# Patient Record
Sex: Female | Born: 1935 | Race: White | Hispanic: No | Smoking: Former smoker
Health system: Southern US, Community
[De-identification: ages and names within clinical notes are randomized; demographics above are authoritative.]

## PROBLEM LIST (undated history)

## (undated) DIAGNOSIS — M810 Age-related osteoporosis without current pathological fracture: Secondary | ICD-10-CM

## (undated) DIAGNOSIS — K219 Gastro-esophageal reflux disease without esophagitis: Secondary | ICD-10-CM

## (undated) DIAGNOSIS — I2510 Atherosclerotic heart disease of native coronary artery without angina pectoris: Secondary | ICD-10-CM

## (undated) DIAGNOSIS — I639 Cerebral infarction, unspecified: Secondary | ICD-10-CM

## (undated) DIAGNOSIS — E7800 Pure hypercholesterolemia, unspecified: Secondary | ICD-10-CM

## (undated) DIAGNOSIS — M1990 Unspecified osteoarthritis, unspecified site: Secondary | ICD-10-CM

## (undated) DIAGNOSIS — I77819 Aortic ectasia, unspecified site: Secondary | ICD-10-CM

## (undated) DIAGNOSIS — I10 Essential (primary) hypertension: Secondary | ICD-10-CM

## (undated) DIAGNOSIS — I712 Thoracic aortic aneurysm, without rupture, unspecified: Secondary | ICD-10-CM

## (undated) DIAGNOSIS — K529 Noninfective gastroenteritis and colitis, unspecified: Secondary | ICD-10-CM

## (undated) HISTORY — PX: BACK SURGERY: SHX140

## (undated) HISTORY — PX: OTHER SURGICAL HISTORY: SHX169

## (undated) HISTORY — DX: Gastro-esophageal reflux disease without esophagitis: K21.9

## (undated) HISTORY — PX: SHOULDER SURGERY: SHX246

## (undated) HISTORY — DX: Unspecified osteoarthritis, unspecified site: M19.90

## (undated) HISTORY — DX: Thoracic aortic aneurysm, without rupture: I71.2

## (undated) HISTORY — PX: TEMPORAL ARTERY BIOPSY / LIGATION: SUR132

## (undated) HISTORY — PX: ABDOMINAL HYSTERECTOMY: SHX81

## (undated) HISTORY — DX: Pure hypercholesterolemia, unspecified: E78.00

## (undated) HISTORY — DX: Thoracic aortic aneurysm, without rupture, unspecified: I71.20

---

## 1998-07-26 ENCOUNTER — Other Ambulatory Visit: Admission: RE | Admit: 1998-07-26 | Discharge: 1998-07-26 | Payer: Self-pay | Admitting: *Deleted

## 1999-07-18 ENCOUNTER — Encounter: Admission: RE | Admit: 1999-07-18 | Discharge: 1999-07-31 | Payer: Self-pay | Admitting: Anesthesiology

## 1999-07-31 ENCOUNTER — Encounter: Payer: Self-pay | Admitting: Neurosurgery

## 1999-08-01 ENCOUNTER — Inpatient Hospital Stay (HOSPITAL_COMMUNITY): Admission: EM | Admit: 1999-08-01 | Discharge: 1999-08-02 | Payer: Self-pay | Admitting: Neurosurgery

## 1999-08-01 ENCOUNTER — Encounter: Payer: Self-pay | Admitting: Neurosurgery

## 1999-11-03 ENCOUNTER — Encounter: Payer: Self-pay | Admitting: Obstetrics and Gynecology

## 1999-11-03 ENCOUNTER — Encounter: Admission: RE | Admit: 1999-11-03 | Discharge: 1999-11-03 | Payer: Self-pay | Admitting: Obstetrics and Gynecology

## 2000-11-04 ENCOUNTER — Encounter: Admission: RE | Admit: 2000-11-04 | Discharge: 2000-11-04 | Payer: Self-pay | Admitting: Obstetrics and Gynecology

## 2000-11-04 ENCOUNTER — Encounter: Payer: Self-pay | Admitting: Obstetrics and Gynecology

## 2001-11-07 ENCOUNTER — Encounter: Payer: Self-pay | Admitting: Obstetrics and Gynecology

## 2001-11-07 ENCOUNTER — Encounter: Admission: RE | Admit: 2001-11-07 | Discharge: 2001-11-07 | Payer: Self-pay | Admitting: Obstetrics and Gynecology

## 2001-11-19 ENCOUNTER — Ambulatory Visit (HOSPITAL_COMMUNITY): Admission: RE | Admit: 2001-11-19 | Discharge: 2001-11-19 | Payer: Self-pay | Admitting: Cardiology

## 2001-12-11 ENCOUNTER — Encounter: Admission: RE | Admit: 2001-12-11 | Discharge: 2001-12-11 | Payer: Self-pay | Admitting: Cardiology

## 2001-12-11 ENCOUNTER — Encounter: Payer: Self-pay | Admitting: Cardiology

## 2003-08-07 ENCOUNTER — Inpatient Hospital Stay (HOSPITAL_COMMUNITY): Admission: EM | Admit: 2003-08-07 | Discharge: 2003-08-09 | Payer: Self-pay | Admitting: Emergency Medicine

## 2003-08-07 ENCOUNTER — Encounter: Payer: Self-pay | Admitting: Cardiology

## 2003-08-08 ENCOUNTER — Encounter: Payer: Self-pay | Admitting: Cardiology

## 2003-11-04 ENCOUNTER — Ambulatory Visit (HOSPITAL_COMMUNITY): Admission: RE | Admit: 2003-11-04 | Discharge: 2003-11-04 | Payer: Self-pay | Admitting: Gastroenterology

## 2004-06-23 ENCOUNTER — Ambulatory Visit (HOSPITAL_COMMUNITY): Admission: RE | Admit: 2004-06-23 | Discharge: 2004-06-23 | Payer: Self-pay | Admitting: Cardiology

## 2004-08-31 ENCOUNTER — Ambulatory Visit (HOSPITAL_BASED_OUTPATIENT_CLINIC_OR_DEPARTMENT_OTHER): Admission: RE | Admit: 2004-08-31 | Discharge: 2004-08-31 | Payer: Self-pay | Admitting: Surgery

## 2004-08-31 ENCOUNTER — Encounter (INDEPENDENT_AMBULATORY_CARE_PROVIDER_SITE_OTHER): Payer: Self-pay | Admitting: Specialist

## 2004-08-31 ENCOUNTER — Ambulatory Visit (HOSPITAL_COMMUNITY): Admission: RE | Admit: 2004-08-31 | Discharge: 2004-08-31 | Payer: Self-pay | Admitting: Surgery

## 2004-10-31 ENCOUNTER — Observation Stay (HOSPITAL_COMMUNITY): Admission: AD | Admit: 2004-10-31 | Discharge: 2004-11-01 | Payer: Self-pay | Admitting: Family Medicine

## 2004-10-31 ENCOUNTER — Ambulatory Visit: Payer: Self-pay | Admitting: Family Medicine

## 2005-01-31 ENCOUNTER — Ambulatory Visit (HOSPITAL_COMMUNITY): Admission: RE | Admit: 2005-01-31 | Discharge: 2005-01-31 | Payer: Self-pay | Admitting: Gastroenterology

## 2006-10-28 ENCOUNTER — Ambulatory Visit (HOSPITAL_COMMUNITY): Admission: RE | Admit: 2006-10-28 | Discharge: 2006-10-28 | Payer: Self-pay | Admitting: Emergency Medicine

## 2006-12-27 ENCOUNTER — Encounter: Admission: RE | Admit: 2006-12-27 | Discharge: 2006-12-27 | Payer: Self-pay | Admitting: Orthopedic Surgery

## 2007-01-06 ENCOUNTER — Ambulatory Visit (HOSPITAL_BASED_OUTPATIENT_CLINIC_OR_DEPARTMENT_OTHER): Admission: RE | Admit: 2007-01-06 | Discharge: 2007-01-06 | Payer: Self-pay | Admitting: Orthopedic Surgery

## 2007-03-19 ENCOUNTER — Encounter: Payer: Self-pay | Admitting: Cardiology

## 2007-03-26 ENCOUNTER — Encounter: Admission: RE | Admit: 2007-03-26 | Discharge: 2007-03-26 | Payer: Self-pay | Admitting: *Deleted

## 2009-01-19 ENCOUNTER — Ambulatory Visit: Payer: Self-pay | Admitting: Surgery

## 2009-04-20 ENCOUNTER — Encounter: Admission: RE | Admit: 2009-04-20 | Discharge: 2009-04-20 | Payer: Self-pay | Admitting: Emergency Medicine

## 2009-04-27 ENCOUNTER — Encounter: Admission: RE | Admit: 2009-04-27 | Discharge: 2009-04-27 | Payer: Self-pay | Admitting: Emergency Medicine

## 2009-08-11 ENCOUNTER — Encounter: Admission: RE | Admit: 2009-08-11 | Discharge: 2009-08-11 | Payer: Self-pay | Admitting: Emergency Medicine

## 2011-01-30 ENCOUNTER — Inpatient Hospital Stay (HOSPITAL_COMMUNITY)
Admission: EM | Admit: 2011-01-30 | Discharge: 2011-02-02 | DRG: 392 | Disposition: A | Discharge status: Home / Self Care | Payer: Medicare Other | Attending: Internal Medicine | Admitting: Internal Medicine

## 2011-01-30 DIAGNOSIS — R011 Cardiac murmur, unspecified: Secondary | ICD-10-CM | POA: Diagnosis present

## 2011-01-30 DIAGNOSIS — D62 Acute posthemorrhagic anemia: Secondary | ICD-10-CM | POA: Diagnosis present

## 2011-01-30 DIAGNOSIS — E876 Hypokalemia: Secondary | ICD-10-CM | POA: Diagnosis present

## 2011-01-30 DIAGNOSIS — K5289 Other specified noninfective gastroenteritis and colitis: Principal | ICD-10-CM | POA: Diagnosis present

## 2011-01-30 DIAGNOSIS — K219 Gastro-esophageal reflux disease without esophagitis: Secondary | ICD-10-CM | POA: Diagnosis present

## 2011-01-30 DIAGNOSIS — E785 Hyperlipidemia, unspecified: Secondary | ICD-10-CM | POA: Diagnosis present

## 2011-01-30 DIAGNOSIS — I2510 Atherosclerotic heart disease of native coronary artery without angina pectoris: Secondary | ICD-10-CM | POA: Diagnosis present

## 2011-01-31 ENCOUNTER — Encounter (HOSPITAL_COMMUNITY): Payer: Self-pay | Admitting: Emergency Medicine

## 2011-01-31 ENCOUNTER — Emergency Department (HOSPITAL_COMMUNITY): Payer: Medicare Other

## 2011-01-31 DIAGNOSIS — R011 Cardiac murmur, unspecified: Secondary | ICD-10-CM

## 2011-01-31 LAB — PROTIME-INR
INR: 1.07 (ref 0.00–1.49)
Prothrombin Time: 14.1 seconds (ref 11.6–15.2)

## 2011-01-31 MED ORDER — IOHEXOL 300 MG/ML  SOLN
100.0000 mL | Freq: Once | INTRAMUSCULAR | Status: AC | PRN
  Administered 2011-01-31: 100 mL via INTRAVENOUS
  Filled 2011-01-31: qty 100

## 2011-02-01 LAB — BASIC METABOLIC PANEL
CO2: 29 mEq/L (ref 19–32)
Glucose, Bld: 110 mg/dL — ABNORMAL HIGH (ref 70–99)
Potassium: 3.7 mEq/L (ref 3.5–5.1)
Sodium: 142 mEq/L (ref 135–145)

## 2011-02-01 LAB — CBC
HCT: 37.9 % (ref 36.0–46.0)
Hemoglobin: 12.5 g/dL (ref 12.0–15.0)
MCH: 30.3 pg (ref 26.0–34.0)
MCHC: 33 g/dL (ref 30.0–36.0)
MCV: 92 fL (ref 78.0–100.0)

## 2011-02-01 LAB — OVA AND PARASITE EXAMINATION

## 2011-02-02 LAB — BASIC METABOLIC PANEL
BUN: 4 mg/dL — ABNORMAL LOW (ref 6–23)
CO2: 26 mEq/L (ref 19–32)
Chloride: 110 mEq/L (ref 96–112)
Creatinine, Ser: 0.57 mg/dL (ref 0.4–1.2)
GFR calc Af Amer: >60 mL/min (ref >60)

## 2011-02-02 LAB — CBC
MCH: 32 pg (ref 26.0–34.0)
MCV: 90.1 fL (ref 78.0–100.0)
Platelets: 136 10*3/uL — ABNORMAL LOW (ref 150–400)
RBC: 3.44 MIL/uL — ABNORMAL LOW (ref 3.87–5.11)

## 2011-02-03 NOTE — Discharge Summary (Signed)
[name], Karen NO.:  [id number]  MEDICAL RECORD NO.:  [id number]           PATIENT TYPE:  I  LOCATION:  1341                         FACILITY:  [facility]  PHYSICIAN:  Hillery Aldo, M.D.   DATE OF BIRTH:  February 11, 1926  DATE OF ADMISSION:  01/30/2011 DATE OF DISCHARGE:  02/02/2011                              DISCHARGE SUMMARY   PRIMARY CARE PHYSICIAN:  Dr. Loraine Leriche Daub  GASTROENTEROLOGIST:  Bernette Redbird, M.D.  DISCHARGE DIAGNOSES: 1. Colitis. 2. Transient lower gastrointestinal bleeding secondary to colitis. 3. Calcified mitral valve with mild left ventricular hypertrophy by     two-dimensional echocardiography. 4. Hypokalemia. 5. History of nonobstructive coronary artery disease. 6. Gastroesophageal reflux disease. 7. Dyslipidemia. 8. Acute blood loss anemia  DISCHARGE MEDICATIONS: 1. Cipro 500 mg p.o. b.i.d. x10 days. 2. Metronidazole 500 mg p.o. q.8 h. x10 days. 3. Calcium 600 plus vitamin D 400 units 1 tablet p.o. daily. 4. Crestor 5 mg p.o. daily. 5. Metoprolol tartrate 12.5 mg p.o. daily. 6. Multivitamin 1 tablet p.o. daily. 7. Natural tears 1 drop in both eyes q.4 h. p.r.n. 8. Protonix 40 mg p.o. daily. 9. Plavix 75 mg every other day, instructed to resume on February 09, 2011.  CONSULTATIONS:  None.  BRIEF ADMISSION HPI:  The patient is an 77 year old female, on chronic Plavix therapy secondary to a history of coronary artery disease in the setting of an aspirin intolerance, who presented to the hospital with chief complaint of abdominal pain, diarrhea, and blood in the stools. Upon initial evaluation in the emergency department, the patient had a CT scan of her abdomen done which showed a left-sided colitis.  She subsequently was referred to the hospitalist service for inpatient treatment.  For the full details, please see the dictated report done by Dr Selena Batten.  PROCEDURES AND DIAGNOSTIC STUDIES: 1. CT scan of the abdomen and pelvis  on January 31, 2011 showed mild     left-sided colitis extending from the splenic flexure to the     rectum.  No evidence of abscess or other complication. 2. Two-dimensional echocardiogram done on January 31, 2011 showed mild     LVH with an ejection fraction of 65% to 70% and a calcified mitral     valve annulus.  DISCHARGE LABORATORY VALUES:  Sodium was 142, potassium 3.1 (repleted with 80 mEq prior to discharge), chloride 110, bicarb 26, BUN 4, creatinine 0.57, glucose 119, calcium 8.0.  White blood cell count was 8.3, hemoglobin 11, hematocrit 31.0, platelets 136,000.  HOSPITAL COURSE BY PROBLEM: 1. Colitis:  The patient was admitted and put on IV Cipro and Flagyl.     She was treated symptomatically and had steady improvement with     decreased abdominal pain and decreased volume of stools.  The     hematochezia completely resolved prior to discharge.  She will be     discharged on p.o. Cipro and Flagyl for an additional 10 days for     full treatment course of 14 days. 2. Lower gastrointestinal bleed/acute blood loss anemia:  The patient     was monitored closely and her hemoglobin  dropped approximately 1.5     gm from her initial values.  Her Plavix was placed on hold and with     treatment of her colitis, the hematochezia completely resolved.  At     this point, would continue to hold the Plavix for another 7 days.     She has been instructed to return to the hospital should she     experience any further significant  lower GI bleeding. 3. Heart murmur:  The patient did have a heart murmur detected on     initial presentation, prompting the admitting physician to order a     two-dimensional echocardiogram.  The only findings of significance     were calcified mitral valve. 4. Hypokalemia secondary to GI losses.  The patient was appropriately     repleted. 5. Nonobstructive coronary artery disease:  The patient has been     maintained on Plavix as an outpatient in  substitution for aspirin     due to her history of aspirin intolerance.  The Plavix has been     placed on hold because of lower GI bleeding which she can safely     resume this in 1 week. 6. Gastroesophageal reflux disease:  The patient was maintained on     proton pump inhibitor therapy. 7. Dyslipidemia:  The patient was maintained on treatment with     Crestor.  DISPOSITION:  The patient is medically stable and be discharged home.  CONDITION ON DISCHARGE:  Improved.  DISCHARGE INSTRUCTIONS:  Return to the emergency department for any significant return of lower GI bleeding.  Time spent coordinating care for discharge and discharge instructions including face-to-face time equals 35 minutes.     Hillery Aldo, M.D.     CR/MEDQ  D:  02/02/2011  T:  02/02/2011  Job:  [id number]  cc:   Dr. Willia Craze, M.D. Fax: [phone]  Electronically Signed by Hillery Aldo M.D. on 02/03/2011 01:39:00 PM

## 2011-02-04 LAB — STOOL CULTURE

## 2011-02-21 NOTE — H&P (Signed)
NAMEMIKEALA, GIRDLER NO.:  [id number]  MEDICAL RECORD NO.:  [id number]          PATIENT TYPE:  EMS  LOCATION:  ED                           FACILITY:  [facility]  PHYSICIAN:  Massie Maroon, MD        DATE OF BIRTH:  1926-11-18  DATE OF ADMISSION:  01/30/2011 DATE OF DISCHARGE:                             HISTORY & PHYSICAL   CHIEF COMPLAINT:  Diarrhea.  HISTORY OF PRESENT ILLNESS:  The patient is an 77 year old female with a history of nonobstructive CAD, hyperlipidemia, GERD, apparently was given his Z-Pak today; that was about 3 p.m. and she took 2 tablets and then at about 8 p.m. she developed severe lower abdominal pain in the suprapubic area and had diarrhea x3, along with some bright red blood per rectum.  The patient called her daughter and was brought to the ER for evaluation.  A CT scan showed mild left-sided colitis extending from the splenic flexure to the rectum.  Differential diagnosis includes pseudomembranous or other infectious etiology along with ischemic colitis or ulcerative colitis.  There was no evidence of abscess or other complications.  The patient states that she had a chest x-ray earlier today which did not reveal any pneumonia.  The patient notes that she had a colonoscopy by Dr. Matthias Hughs about 3 to 4 years ago which was negative per patient.  The patient has not experienced diarrhea such as this before.  There is no travel history.  There is no history of odd foods eaten.  Only antibiotic used is what she took today.  The patient will be admitted for colitis.  PAST MEDICAL HISTORY: 1. CAD, 30% plaque in the proximal LAD on cardiac catheterization,     June 23, 2004. 2. Hyperlipidemia. 3. Myalgia felt to be statin related. 4. Neuropathy. 5. GERD, hiatal hernia. 6. Osteoporosis.  PAST SURGICAL HISTORY: 1. 1980, TAH-BSO 2. 1987, shunt in the back for syringomyelia. 3. 1990, two back surgeries for disks. 4. November 27, 2001, cardiac  catheterization by Dr. Roger Shelter -     EF 70%,, no mitral regurgitation, 30% to 50% narrowing at the mid     portion of the LAD. 5. November 04, 2003, colonoscopy by Dr. Bernette Redbird - normal. 6. June 23, 2004, cardiac catheterization, 30% plaque in the proximal     one-half of the left anterior descending. 7. August 31, 2004, right temporal artery biopsy, negative. 8. January 31, 2005, upper endoscopy by Dr. Molly Maduro Buccini, hiatal     hernia. 9. January 06, 2007, acromioplasty, right shoulder, debridement of     glenohumeral and subacromial spaces and removal of foreign body in     the form of suture material from prior surgical intervention by Dr.     Jodi Geralds.  SOCIAL HISTORY:  The patient does not smoke or drink.  She quit smoking about 60 years ago.  She smoked 1-pack per day x2 years.  She is living at home with her husband.  She has 2 children, one of whom is a PA.  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  FAMILY HISTORY:  Mother died at age 47 after  she fell and broke her hip. Father died at age 33 of broken hip as well.  She has nephews who have Crohn's disease.  ALLERGIES:  ASPIRIN, DAYPRO, NIACIN, LAMISIL, TETRACYCLINE, VERAPAMIL, BEXTRA, PENICILLIN.  MEDICATIONS:  Crestor 5 mg p.o. q.h.s., Protonix 40 mg p.o. q.h.s., Plavix 75 mg p.o. every other day, metoprolol tartrate 25 mg 1/2 p.o. daily.  REVIEW OF SYSTEMS:  Negative for all 10 organ systems except for pertinent positives stated above.  PHYSICAL EXAM:  VITAL SIGNS:  Temperature afebrile, pulse 70, blood pressure is 125/78, pulse ox 97% on room air. HEENT:  Anicteric. NECK:  No JVD, no bruit. HEART:  Regular rate and rhythm.  S1, S2 with a 2 out of 6 systolic ejection murmur at the right upper sternal border. LUNGS:  Clear to auscultation bilaterally. ABDOMEN:  Soft, obese, nontender, nondistended.  Positive bowel sounds. EXTREMITIES:  No cyanosis, clubbing or edema. SKIN:  No rashes. LYMPH NODES:  No  adenopathy. NEURO EXAM:  Nonfocal.  RADIOLOGY AND LABORATORY DATA:  CT scan shows mild left-sided colitis extending from the splenic flexure to the rectum.  Differential diagnosis, pseudomembranous or other infectious etiology, ischemic colitis or ulcerative colitis.  Labs, January 31, 2011; sodium 137, potassium 3.5, BUN 15, creatinine 0.73.  AST 29, ALT 14, alk phos 47, total bilirubin is 0.6.  WBC 11.3, hemoglobin 14.4, platelet count 209.  PTT 23, INR 0.96.  Hemoccult stool negative.  Urinalysis negative.  ASSESSMENT AND PLAN: 1. Abdominal pain secondary to colitis:  We will check stool for fecal     leukocytes, culture, ova and parasites and C diff PCR.  The patient     will be started on Cipro 400 mg IV b.i.d. along with Flagyl 500 mg     IV t.i.d.  Check lipase 2. Diarrhea:  Check stool studies as above.  Check TSH.  Check lactic     acid.  GI consult in the a.m. for evaluation with colonoscopy. 3. Coronary artery disease  (nonobstructive).  Continue metoprolol.     Continue Crestor.  Continue Plavix. 4. Gastroesophageal reflux disease:  Continue Protonix. 5. Cardiac murmur.  A 2-D echo. 6. Deep vein thrombosis prophylaxis.  SCDs.     Massie Maroon, MD     JYK/MEDQ  D:  01/31/2011  T:  01/31/2011  Job:  [id number]  cc:   Bernette Redbird, M.D. Fax: [phone]  Denzil Magnuson Fax: [phone]  Electronically Signed by Pearson Grippe MD on 02/21/2011 06:59:29 AM

## 2011-05-15 NOTE — Procedures (Signed)
DUPLEX ULTRASOUND OF ABDOMINAL AORTA   INDICATION:  Back pain.   HISTORY:  Diabetes:  No.  Cardiac:  No.  Hypertension:  No.  Smoking:  No.  Connective Tissue Disorder:  Family History:  No.  Previous Surgery:  No.   DUPLEX EXAM:         AP (cm)                   TRANSVERSE (cm)  Proximal             1.57 cm                   1.56 cm  Mid                  1.90 cm                   1.93 cm  Distal               1.38 cm                   1.33 cm  Right Iliac          0.88 cm                   0.94 cm  Left Iliac           0.91 cm                   0.91 cm   PREVIOUS:  Date:  AP:  TRANSVERSE:   IMPRESSION:  Duplex does not show evidence of abdominal aortic aneurysm.   ___________________________________________  V. Charlena Cross, MD   AC/MEDQ  D:  01/19/2009  T:  01/19/2009  Job:  [id number]

## 2011-05-18 NOTE — Op Note (Signed)
[name], STAFF NO.:  [id number]   MEDICAL RECORD NO.:  [id number]          PATIENT TYPE:  AMB   LOCATION:  ENDO                         FACILITY:  [facility]   PHYSICIAN:  Bernette Redbird, M.D.   DATE OF BIRTH:  [birth year]   DATE OF PROCEDURE:  01/31/2005  DATE OF DISCHARGE:                                 OPERATIVE REPORT   PROCEDURE PERFORMED:  Upper endoscopy.   INDICATIONS FOR PROCEDURE:  A 78 year old female with nonspecific chest pain  which seems to have improved and resolved on a transient course of high-dose  Nexium. In fact, the patient is now off the Nexium and continuing to feel  well.   FINDINGS:  Essentially normal exam. Minimal hiatal hernia.   PROCEDURE:  The nature, purpose, risks of procedure had been discussed with  the patient who provided written consent. Sedation was fentanyl 50 mcg and  Versed 5 milligrams IV without arrhythmias or desaturation.   The Olympus video endoscope was passed under direct vision.  The vocal cords  were not able to be well seen due to an overlying epiglottis, but the  esophagus was readily entered.   The esophageal mucosa was normal. I did not see any free reflux nor was  there evidence of reflux esophagitis, Barrett's esophagus, varices,  infection, neoplasia, or any ring or stricture. A minimal 1 cm hiatal hernia  was seen. The stomach contained no residual and had normal mucosa without  evidence of gastritis, erosions, ulcers, polyps or masses. Retroflexed view  of the cardia showed a minimally patulous diaphragmatic hiatus.   In the prepyloric area there may been some very mild local erythema but  nothing impressive.   The pylorus, duodenal bulb and second duodenum looked normal.   The scope was removed from the patient who tolerated the procedure well  without any apparent complication.   IMPRESSION:  1.  Chest pain, without obvious esophageal source. Based on endoscopic      inspection.  [phone]).  2.  A normal endoscopy does not preclude the possibility of intermittent      esophageal spasm or reflux as the source of the patient's symptoms,      since these findings did not necessarily lead to endoscopically      observable manifestation.   PLAN:  In view of the patient's symptomatic improvement, I think she can  resume previous therapy with Prevacid. No endoscopic follow-up is needed.      RB/MEDQ  D:  01/31/2005  T:  01/31/2005  Job:  [id number]   cc:   Brett Canales A. Cleta Alberts, M.D.  [address]  [place]  [place] [address]  Fax: [phone]

## 2011-05-18 NOTE — Discharge Summary (Signed)
NAMEMarland Kitchen  [name], [name]                     ACCOUNT NO.:  [id number]   MEDICAL RECORD NO.:  [id number]                   PATIENT TYPE:  INP   LOCATION:  2031                                 FACILITY:  [facility]   PHYSICIAN:  [name], M.D.            DATE OF BIRTH:  22-Dec-1926   DATE OF ADMISSION:  08/07/2003  DATE OF DISCHARGE:  08/09/2003                                 DISCHARGE SUMMARY   PRIMARY DISCHARGE DIAGNOSIS:  Atypical chest pain with negative cardiac  enzymes.   SECONDARY DISCHARGE DIAGNOSES:  1. Previous history of equivocal Cardiolite in 2002 with subsequent cardiac     catheterization showing only a 30-50% lesion in the mid LAD.  2. Aspirin allergy, maintained on chronic Plavix.  3. Hiatal hernia with gastroesophageal reflux.  4. History of hyperlipidemia.  5. Neuropathy.  6. Osteoporosis.   HISTORY OF PRESENT ILLNESS:  The patient is a 77 year old female who has a  history of known mild coronary disease and hyperlipidemia.  She was referred  to the emergency room by Dr. Cleta Turner [facility] for evaluation of chest  pain.  It was seemingly worse after eating.  After lying down on her couch,  she noted that the pain seemed to go away, but after taking a shower, her  pain reoccurred.  She took Tums and nitroglycerin without any relief.  [facility]  [facility]ed [facility], where her EKG was nonacute.  She was  subsequently referred to the emergency room for further evaluation.   Please see dictated history and physical, per Dr. Peter Turner, for further  patient presentation and profile.   LABORATORY DATA:  CBC was normal.  Chemistries were normal.  PT and PTT were  normal.  All cardiac enzymes were negative.  EKG was nonacute.   HOSPITAL COURSE:  Patient was admitted.  Cardiac enzymes were drawn serially  and were unremarkable.  An abdominal ultrasound was also performed, which  showed no evidence of gallstones.  Her proton pump inhibitor was  increased,  and she was subsequently pain free and felt to be a stable candidate for  discharge on August 09, 2003.   DISCHARGE CONDITION:  Stable.   DISCHARGE MEDICATIONS:  She will resume her Neurontin, Plavix, Zocor, Zetia,  Actonel and multivitamin as before.  We will stop her Prevacid and place her  on Nexium 40 mg a day.    FOLLOW UP:  We will see her back in the office in a couple of weeks.  May  need to proceed with a repeat stress Cardiolite study if she remains  symptomatic.  She is to call if any problems develop in the interim.      [name], N.P.                 [name], M.D.    LCO/MEDQ  D:  08/09/2003  T:  08/09/2003  Job:  [id number]  cc:   [name]. [name], M.D.  [address]  [place]  [place] [address]  Fax: [phone]

## 2011-05-18 NOTE — Op Note (Signed)
[name], [name] NO.:  [id number]   MEDICAL RECORD NO.:  [id number]          PATIENT TYPE:  AMB   LOCATION:  DSC                          FACILITY:  [facility]   PHYSICIAN:  Harvie Junior, M.D.   DATE OF BIRTH:  Apr 09, 1926   DATE OF PROCEDURE:  01/06/2007  DATE OF DISCHARGE:                               OPERATIVE REPORT   PREOPERATIVE DIAGNOSIS:  Status post rotator cuff tear with persistent  pain, right shoulder.   POSTOPERATIVE DIAGNOSES:  Status post rotator cuff tear with persistent  pain, right shoulder, with retained foreign body in the form of suture  with impingement-related pain.   PRINCIPLE PROCEDURES:  1. Acromioplasty, right shoulder.  2. Debridement aggressively of glenohumeral and subacromial spaces.  3. Removal of foreign body in the form of suture material from      previous surgical intervention.   SURGEON:  Harvie Junior, M.D.   ASSISTANT:  Marshia Ly, P.A.   ANESTHESIA:  General.   BRIEF HISTORY:  [name] is a 76 year old female with a long  history of having had significant right shoulder pain.  She also has  been treated conservatively for a prolonged period of time with  injection therapy postoperatively.  She had a significant tension on her  initial repair, and we did not feel that she was going to be a candidate  for further repair.  MRI was obtained, which showed that she had a  chronically retracted rotator cuff with atrophic muscle.  At that point,  we figured that a debridement may be of some benefit for her and she was  brought to the operating room for this procedure.   PROCEDURES:  The patient was brought to the operating room, and after  adequate anesthesia was obtained with a general anesthetic, the patient  was placed supine on the operating table.  The right shoulder was  prepped and draped in the usual sterile fashion.  Following this,  routine arthroscopic examination of the shoulder revealed that  there was  obvious suture just in the glenohumeral joint.  This was braided  FiberWire type suture.  This was debrided from within the glenohumeral  joint by way of a grasper.  The subacromial space was open.  There was  no rotator cuff in the subacromial space.  In fact, we found knotted  suture anchor and suture from the previous repair.  This was debrided  laterally at the insertion of the rotator cuff.  The glenohumeral joint  was then identified further and debrided and noted to be within normal  limits.  The rotator cuff overall was found, but was way retracted  medially.  There was no ability to pull this cuff back out to a position  of any kind of repair.  We debrided the edges of that, freed it up and  debrided it thoroughly.  We did a washout more appropriately of the  glenohumeral joint and subacromial space.  I took the ArthroCare in and  debrided the subacromial space, did an acromioplasty with a 42 bur just  to open up the space a little bit more,  and then cleaned out  aggressively laterally at the rotator cuff insertion.  When this was completed, the shoulder was copiously irrigated with  suctioned dry.  The arthroscopic portals were closed with a bandage.  A  sterile compressive dressing was applied.  The patient was taken to the  recovery room, where she was noted to be in satisfactory condition.  The  estimated blood loss for the procedure was negligible.      Harvie Junior, M.D.  Electronically Signed     JLG/MEDQ  D:  01/06/2007  T:  01/06/2007  Job:  [id number]

## 2011-05-18 NOTE — Cardiovascular Report (Signed)
[place]. [facility]  Patient:    [name], [name] Visit Number: [id number] MRN: [id number]          Service Type: CAT Location: [facility] 2856 [id number] Attending Physician:  Eleanora Neighbor Dictated by:   Colleen Can Deborah Chalk, M.D. Proc. Date: 11/19/01 Admit Date:  11/19/2001                          Cardiac Catheterization  REFERRING PHYSICIAN: Dr. Viviann Spare ______.  HISTORY: The patient is referred for evaluation of atypical chest pain and an equivocal Cardiolite study. She is referred for evaluation.  PROCEDURE: Left heart catheterization with selective coronary angiography, left ventricular angiography.  TYPE AND SITE OF ENTRY: Percutaneous right femoral artery.  CATHETERS: The 6 French 4 curved Judkins right and left coronary catheters, 6 French pigtail ventriculographic catheters.  CONTRAST MATERIAL: Omnipaque.  MEDICATIONS GIVEN PRIOR TO THE PROCEDURE: Valium 10 mg p.o.  MEDICATIONS GIVEN DURING THE PROCEDURE: None.  COMMENTS: The patient tolerated the procedure well.  HEMODYNAMIC DATA: The aortic pressure was 126/57, LV is 127/11.  ANGIOGRAPHIC DATA: 1. Left main coronary artery: Left main coronary artery is normal. 2. Left anterior descending: The left anterior descending had 30-50%    narrowing in the midportion of the vessel. It is a long vessel that    crossed the apex. 3. Left circumflex: The let circumflex was tortuous. It had mild    irregularities, otherwise normal. 4. Right coronary artery: The right coronary artery is a small but    dominant vessel. It is normal.  LEFT VENTRICULOGRAPHY: The left ventricular angiogram was performed in the RAO position.  Overall cardiac size and silhouette were normal.  Global ejection fraction is 70%. There is no mitral regurgitation, intracardiac calci fication, or intracavitary filling defect. It did appear that the proximal aorta was somewhat dilated.  OVERALL IMPRESSION: 1. Mild to  moderate single-vessel coronary atherosclerosis. 2. Normal left ventricular function. 3. 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 Dictated by:   Colleen Can Deborah Chalk, M.D. Attending Physician:  Eleanora Neighbor DD:  11/19/01 TD:  11/19/01 Job: [id number]

## 2011-05-18 NOTE — H&P (Signed)
NAME:  [name], [name] NO.:  [id number]   MEDICAL RECORD NO.:  [id number]                   PATIENT TYPE:  INP   LOCATION:  1826                                 FACILITY:  [facility]   PHYSICIAN:  [name], M.D.               DATE OF BIRTH:  02-12-1926   DATE OF ADMISSION:  08/07/2003  DATE OF DISCHARGE:                                HISTORY & PHYSICAL   HISTORY OF PRESENT ILLNESS:  [name] is a 77 year old white female  with history of mild coronary disease and hypercholesterolemia.  She was  referred by Dr. Cleta Turner for evaluation of chest pain.  The patient states she  developed mid lower sternal pain early this afternoon.  It seemed to worsen  after dinner.  She laid down on her couch, and states that the pain seemed  to go away.  However, after taking a shower, her pain recurred and was more  severe.  She took Tums and sublingual nitroglycerin without relief.  She  went to [facility] where initially her pain seemed to subside, but  then returned at a grade 8/10.  ECG was nonacute.  She was referred to the  emergency room for further evaluation.  The patient has undergone cardiac  evaluation in the past including a stress Cardiolite study in 2002 which was  equivocal.  She subsequently underwent cardiac catheterization which  demonstrated 30-50% lesion in the mid-LAD; otherwise normal.   PAST MEDICAL HISTORY:  Significant for a hiatal hernia with gastroesophageal  reflux disease.  She has had several back surgeries.  She is status post  hysterectomy, and has had a history of hypercholesterolemia.   ALLERGIES:  1. ASPIRIN.  2. PENICILLIN.  3. DAYPRO.  4. BEXTRA.  5. NIACIN.  6. LAMISIL.   CURRENT MEDICATIONS:  1. Neurontin 600 mg in the morning, 300 mg at noon, and 600 mg in the     evening.  2. Plavix 75 mg every other day.  3. Zocor 20 mg per day.  4. Zetia 10 mg per day.  5. Actonel once a week.  6. Prevacid 30 mg per  day.  7. Multivitamin daily.   SOCIAL HISTORY:  The patient lives with her husband.  She has no history of  tobacco or alcohol use.   FAMILY HISTORY:  Both parents died in their 33's of natural causes.   REVIEW OF SYSTEMS:  This patient also complains of some mid-thoracic back  pain.  She has no nausea or vomiting.  She has had chronic constipation.  All other review of systems are negative.   PHYSICAL EXAMINATION:  GENERAL:  The patient is a very pleasant white female  in no distress.  VITAL SIGNS:  Blood pressure 137/74, pulse 66 and regular, respirations 20.  She is afebrile.  HEENT:  Unremarkable.  She has no JVD or bruits.  LUNGS:  Clear.  CARDIAC:  Regular rate and  rhythm.  Normal S1 and S2 without gallops,  murmurs, or clicks.  There is no chest wall tenderness to palpation.  ABDOMEN:  Soft and nontender without hepatosplenomegaly or masses.  There is  no epigastric tenderness.  EXTREMITIES:  Without edema or phlebitis.  Pulses are 2+ and symmetric.  NEUROLOGIC:  Intact.   LABORATORY DATA:  ECG shows normal sinus rhythm with low voltage.  Otherwise  no acute change.   IMPRESSION:  1. Chest pain with atypical features.  Need to rule out myocardial     infarction.  2. Mild coronary disease by cardiac catheterization in November of 2002.  3. Hypercholesterolemia.  4. Hiatal hernia.   PLAN:  Admit for observation.  Will check serial cardiac enzymes.  Will  treat with subcu Lovenox until her enzymes return.  Consider abdominal  ultrasound to rule out gallstones.                                                [name], M.D.    PMJ/MEDQ  D:  08/07/2003  T:  08/07/2003  Job:  [id number]   cc:   [name], M.D.  [address]  [place]  [place] [address]  Fax: [phone]   [name], M.D.  [address]  [place]  [place] [address]  Fax: [phone]

## 2011-05-18 NOTE — Op Note (Signed)
   NAMEFREDRICK, [name]                     ACCOUNT NO.:  [id number]   MEDICAL RECORD NO.:  [id number]                   PATIENT TYPE:  AMB   LOCATION:  ENDO                                 FACILITY:  [facility]   PHYSICIAN:  Bernette Redbird, M.D.                DATE OF BIRTH:  24-Apr-1926   DATE OF PROCEDURE:  11/04/2003  DATE OF DISCHARGE:                                 OPERATIVE REPORT   PROCEDURE:  Colonoscopy.   INDICATIONS:  A 77 year old for colon cancer screening.   FINDINGS:  Normal exam to the cecum.   DESCRIPTION OF PROCEDURE:  The nature, purpose, and risks of the procedure  had been discussed with the patient, who provided written consent.  Sedation  was fentanyl 65 mcg and Versed 5 mg IV without arrhythmias or desaturation.  The Olympus adjustable-tension pediatric video colonoscope was quite easily  advanced to the cecum as identified by clear visualization of the  appendiceal orifice.  There was some sigmoid angulation and fixation.   The quality of the prep was excellent, and it is felt that all areas were  well-seen during pullback.   This was a normal examination.  No polyps, cancer, colitis, vascular  malformations, or diverticulosis were noted.  I could not retroflex in the  rectum despite attempting to do so because of a small rectal ampulla.  Antegrade viewing, however, disclosed only moderately excoriated internal  hemorrhoids but no other lesions.  Reinspection of the rectum was  unremarkable.   No biopsies were obtained.  The patient tolerated the procedure well and  there were no complications.   IMPRESSION:  Normal screening colonoscopy.   PLAN:  Consider screening flexible sigmoidoscopy in five years for ongoing  screening.                                               Bernette Redbird, M.D.    RB/MEDQ  D:  11/04/2003  T:  11/04/2003  Job:  [id number]   cc:   Brett Canales A. Cleta Alberts, M.D.  [address]  [place]  [place] [address]  Fax: [phone]

## 2011-05-18 NOTE — H&P (Signed)
[place]  Patient:    [name], [name] Visit Number: [id number] MRN: [id number]          Service Type: Attending:  Colleen Can. Deborah Turner, M.D. Dictated by:   [name], R.N., A.N.P. Adm. Date:  11/19/01   CC:         [name]   History and Physical  DATE OF BIRTH:  1926/05/24  CHIEF COMPLAINT:  Atypical neck pain.  HISTORY OF PRESENT ILLNESS:  [name] is a very pleasant 77 year old white female who was referred to our office for consultation following an abnormal stress Cardiolite study.  Approximately three weeks prior, she had had the onset of posterior neck pain while she was attending church.  It was associated with a cold sweat.  She has subsequently had neck x-rays, EKGs, MRI, as well as this stress test performed.  She notes that she has had some left-sided arm discomfort when walking in the afternoons.  When she walks in the morning she has no discomfort whatsoever.  She has had a previous history of a heart catheterization approximately 10 years ago which was reportedly normal.  She has had a stress Cardiolite study performed on October 27, 2001.  With this the patient demonstrated fair exercise tolerance with an adequate blood pressure response.  She had associated dyspnea but no chest pain. Electrocardiographically was equivocal for ischemia.  She did have normal ejection fraction with normal regional wall motion.  Her ejection fraction was 78%.  The findings overall were felt to be somewhat equivocal.  They clearly did not exclude the possibility of ischemia, especially in the anterior apical region.  She is now referred on for elective coronary angiography.  PAST MEDICAL HISTORY: 1. Prior history of cardiac catheterization reportedly normal per [name] eight to 10 years ago. 2. History of reflux. 3. History of back surgery. 4. Status post hysterectomy. 5. Hypercholesterolemia, on  Mevacor.  ALLERGIES:  ASPIRIN causes a choking-like sensation.  CURRENT MEDICATIONS: 1. Naprosyn p.r.n. 2. Climara once a week. 3. Multivitamin daily.  FAMILY HISTORY:  Her father died at the age of 57, mother died at age 62 - both of natural causes.  SOCIAL HISTORY:  She is married.  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  There is no alcohol, no tobacco.  REVIEW OF SYSTEMS:  Is basically as stated above and is otherwise unremarkable.  She has had no recent fever, flu, or cough.  She has been exercising regularly.  She has had no abdominal pain, constipation, diarrhea; no blood in the stool.  PHYSICAL EXAMINATION:  GENERAL:  She is a pleasant elderly white female in no acute distress.  VITAL SIGNS:  Blood pressure 150/70 sitting, 170/80 standing; heart rate 84 and regular; respirations 18.  She is afebrile.  SKIN:  Warm and dry.  Color is unremarkable.  NECK:  Supple.  No JVD.  LUNGS:  Clear.  HEART:  Regular rhythm.  ABDOMEN:  Soft, positive bowel sounds, nontender.  EXTREMITIES:  Without edema.  NEUROLOGIC:  Intact.  There are no gross focal deficits.  LABORATORY DATA:  Pending.  OVERALL IMPRESSION:  Equivocal stress Cardiolite study in the setting of left arm pain, questionable exertion in nature.  PLAN:  Will proceed on with elective coronary angiography.  The procedure has been discussed in full detail and she is willing to proceed. Dictated by:   [name], R.N., A.N.P. Attending:  Colleen Can. Deborah Turner, M.D. DD:  11/17/01 TD:  11/17/01 Job: [id number]

## 2011-05-18 NOTE — H&P (Signed)
NAME:  [name], TRINIDAD                     ACCOUNT NO.:  [id number]   MEDICAL RECORD NO.:  [id number]                   PATIENT TYPE:  OIB   LOCATION:                                       FACILITY:  [facility]   PHYSICIAN:  Colleen Can. Deborah Chalk, M.D.            DATE OF BIRTH:  [birth year]   DATE OF ADMISSION:  06/23/2004  DATE OF DISCHARGE:                                HISTORY & PHYSICAL   CHIEF COMPLAINT:  Chest pain.   HISTORY OF PRESENT ILLNESS:  [name] is a 77 year old white female who  has a history of known single-vessel coronary disease, hyperlipidemia.  She  is statin intolerant.  She presents to the office with a work-in  appointment, on June 20, 2004, with two episodes of prolonged chest pain.  She has a known history of ischemic heart disease with last catheterization,  in November 2002, which showed mild to moderate single-vessel disease,  normal LV function, and probable mild dilatation of the proximal aorta.  Her  last Cardiolite study was ten months ago.  She presents with complaints of  two episodes of chest pain.  She notes that this past Sunday, after having  steak for dinner, several hours later she had substernal chest pain.  She  really had no other associated symptoms.  She took Prevacid, Tums, and  subsequently nitroglycerin.  The nitroglycerin may have given her some  relief, she is not really sure.  It basically got better on its own, after 2-  3 hours and subsided.  Monday evening at around 8 p.m., she had similar  pain.  It was not as severe, and it only lasted for approximately one hour.  At that time, she did not take any extra medicine, and she was able to go to  sleep without problems.  She has been walking two miles a day which takes  her approximately 45 minutes to do so.  She has been doing that without  problems.  She has had recent extensive GI evaluation for abdominal bloating  and constipation which has all been unremarkable.  She has had  unremarkable  gallbladder ultrasound, HIDA scan, and CT of the abdomen per her report.  She has been maintained on proton pump inhibitor.  She is currently pain  free.  She is now referred for elective cardiac catheterization.   PAST MEDICAL HISTORY:  1. Known ischemic heart disease, previous history of catheterization, in     November 2002, with single-vessel disease of the LAD.  Her last     Cardiolite was in August 2004.  2. Myalgias, felt to be statin related.  3. Hyperlipidemia on Zetia as monotherapy.  4. Neuropathy.  5. History of hiatal hernia with gastroesophageal reflux disease.  6. Previous back surgery.  7. Status post hysterectomy.  8. Osteoporosis.   ALLERGIES:  1. ASPIRIN.  2. PENICILLIN.  3. DAYPRO.  4. BEXTRA.  5. NIACIN.  6. LAMISIL.  CURRENT MEDICINES:  1. Prevacid 30 mg now b.i.d.  2. Zetia 10 mg a day.  3. Neurontin 300 mg five tablets daily.  4. Plavix 75 mg every other day.  5. Calcium daily.  6. Multivitamin daily.   FAMILY HISTORY:  Both of her parents died in the 73s of natural causes.   SOCIAL HISTORY:  She lives with her husband.  There is no alcohol or  tobacco.   REVIEW OF SYSTEMS:  She does have some chronic constipation.  She is now on  MiraLax.  Her abdominal bloating has subsequently resolved.  She had an  extensive GI evaluation that was basically unremarkable.  She does remain  active.  She has had no recent fever, flu, or cough.  She has had no  shortness of breath.  Her energy level, she states, has been fairly stable.   PHYSICAL EXAMINATION:  GENERAL:  She is a pleasant, elderly, white female  who appears younger than her stated age.  VITAL SIGNS:  Blood pressure 110/60 sitting, 120/70 standing.  Heart rate is  72 and regular.  Respirations 18.  Weight is 126 pounds.  SKIN:  Warm and dry.  Color is unremarkable.  LUNGS:  Clear.  HEART:  Shows a regular rhythm.  ABDOMEN:  Soft.  Positive bowel sounds.  Nontender.  EXTREMITIES:   Without edema.  NEUROLOGIC:  Intact.  There are no gross focal deficits.   PERTINENT LABS:  Pending.   OVERALL IMPRESSION:  1. Two prolonged episodes of chest pain.  2. Known single-vessel coronary disease.  3. Previous extensive gastrointestinal evaluation with unremarkable     findings.  4. Hyperlipidemia on monotherapy with Zetia, due to statin intolerance.  5. Gastroesophageal reflux disease maintained on proton pump inhibitor.   PLAN:  Her Prevacid is increased to a b.i.d. dosing schedule.  We will make  arrangements to repeat her cardiac catheterization to rule out ischemic  etiology.  The procedure has been reviewed in full detail with both her and  her husband.  She is to use nitroglycerin on an as needed basis or report to  the emergency room if her symptoms do not subside or worsen in any way.      Sharlee Blew, N.P.                     Colleen Can. Deborah Chalk, M.D.    LC/MEDQ  D:  06/20/2004  T:  06/22/2004  Job:  [id number]   cc:   Brett Canales A. Cleta Alberts, M.D.  [address]  [place]  [place] [address]  Fax: [phone]

## 2011-05-18 NOTE — Discharge Summary (Signed)
[name], ANDY NO.:  [id number]   MEDICAL RECORD NO.:  [id number]          PATIENT TYPE:  INP   LOCATION:  5506                         FACILITY:  [facility]   PHYSICIAN:  Pearlean Brownie, M.D.DATE OF BIRTH:  1926/02/08   DATE OF ADMISSION:  10/31/2004  DATE OF DISCHARGE:  11/01/2004                                 DISCHARGE SUMMARY   CONSULTATIONS:  None.   PROCEDURE:  None.   LABORATORY DATA:  CBC on admission shows a white count 7.8, hemoglobin 11.5,  hematocrit 36.4, platelet count 241.  UA was negative for nitrates, trace  leukocyte esterase, 0-2 reds, 2-6 whites, occasional epis.  EKG showed heart  rate 105, left axis deviation, no ST elevation, depression, or Q waves.  Repeat CBC this morning, the day of discharge, shows a hemoglobin 12.6,  hematocrit 36.5.  Ferritin 58, folic acid greater than 20.  INR 0.9.   HOSPITAL COURSE:  Problem 1:  In short, the patient is a 77 year old white female with a  history of hiatal hernia and GERD who presented to an urgent care with 2-3  days of feeling shaky and trembly inside.  She had taken Naprosyn b.i.d. for  headache for the last six days and had reported some severe abdominal pain.  She denied any blood in the stools, sputum, or urine.  She also denied any  chest pain.  Please see the remainder of the H&P for further details, but in  short, the patient was admitted for light headedness  with a differential  diagnosis including dehydration versus anemia versus medication side-effect,  versus an infection.  She was admitted for IV hydration and also to evaluate  for possible sources of a GI bleed given the history of chronic NSAID use.   Problem 2:  Tachycardia thought to be secondary to possible anemia versus  dehydration, doubted to be secondary to an MI given a normal EKG.   Problem 3:  Anemia, normocytic.  There was no sign of GI bleed.  The stools  were hemoccult negative x 3 and had a recent negative  colonoscopy.  For  thoroughness, hemoccult stools were checked x 3 while in house which were  all negative.  She was also admitted to check an iron panel, B12, serum  folate, and an INR.   From an F&N standpoint, the patient was admitted for rehydration.  She was  also admitted to continue the remainder of her home medications for her  chronic medical issues.   On the day of discharge, the patient reported decreased jitteriness that  morning, denied any light headedness or syncope, and was no longer  tachycardic.  Blood pressure on the morning of discharge was 113/63, pulse  84, respiratory rate 20, and the patient was afebrile.  Follow up hemoglobin  had increased to 12.6 and showed no signs of any kind of acute, important GI  bleed.  Orthostatic vital signs were obtained the morning of discharge and  were negative, blood pressure 109/64 with a pulse of 68 lying down, 99/64  with a pulse of 74 sitting, and 102/64 with a pulse of 75  standing.  Iron  studies, folic acid studies, and INR were all within normal limits.  On the  day of discharge, the patient was sent home having had tachycardia and  orthostatic light headedness  resolved, status post IV fluid rehydration  overnight.  Furthermore, the anemia had not worsened.  Repeat orthostatic  vital signs were taken prior to discharge and were normal.   DISCHARGE MEDICATIONS:  Amitriptyline 120 mg p.o. q.h.s., MiraLax 17 grams  p.o. daily p.r.n. constipation, Zetia 75 mg p.o. daily, Protonix 40 mg p.o.  daily, and Plavix 75 mg p.o. daily.   DISCHARGE INSTRUCTIONS:  The patient was instructed to follow up with urgent  care as needed as there needed to be no acute follow up given the fact that  her symptoms had totally resolved and the patient reported feeling back to  her baseline mental status.       WTP/MEDQ  D:  11/01/2004  T:  11/01/2004  Job:  [id number]

## 2011-05-18 NOTE — Cardiovascular Report (Signed)
NAMERAINELLE, SULEWSKI                     ACCOUNT NO.:  [id number]   MEDICAL RECORD NO.:  [id number]                   PATIENT TYPE:  OIB   LOCATION:  2854                                 FACILITY:  [facility]   PHYSICIAN:  Colleen Can. Deborah Chalk, M.D.            DATE OF BIRTH:  08-12-1926   DATE OF PROCEDURE:  DATE OF DISCHARGE:  06/23/2004                              CARDIAC CATHETERIZATION   PROCEDURE:  Left heart catheterization with selective coronary angiography,  left ventricular angiography.   CARDIOLOGIST:  Colleen Can. Deborah Chalk, M.D.   TYPE AND SITE OF ENTRY:  Percutaneous right femoral artery, percutaneous  right femoral vein.   CATHETERS:  A 6 French 4 curved Judkins right and left coronary catheters, 6  French pigtail ventriculographic catheter.   CONTRAST MATERIAL:  Omnipaque   MEDICATIONS GIVEN PRIOR TO THE PROCEDURE:  Valium 10 mg p.o.   MEDICATIONS GIVEN DURING THE PROCEDURE:  Versed 2 mg IV.   COMMENTS:  The patient tolerated the procedure well.  AngioSeal was used.  She received vancomycin 1 g post procedure.   HEMODYNAMIC DATA:  The aortic pressure was 125/54.  LV was 126/7/14.  There  was a questionable intracavitary gradient that would be somewhat small from  the apex to the base of the heart with the hyperdynamic small left  ventricular chamber, but it was reasonably insignificant.   CORONARY ARTERIES:  The coronary arteries arise and distribute normally.  1. Right coronary artery is normal.  2. Left main coronary artery is normal.  3. Left circumflex is a moderate size vessel.  It is tortuous but normal.  4. Left anterior descending:  The left anterior descending has somewhat     diffuse 30% plaque in the proximal 1/2.  The distal vessel is relatively     free of disease.  There is no obstructive disease in the left anterior     descending.   LEFT VENTRICULOGRAPHY:  Left ventricular angiogram was performed in the RAO  position.  The overall cardiac size  and silhouette were normal.  There was a  hyperdynamic left ventricle with an ejection fraction was 60-70%.  Regional  wall motion was normal.   The thoracic aortic root was somewhat dilated but showed no evidence of  dissection.   OVERALL IMPRESSION:  1. Normal and hyperdynamic left ventricle.  2. Mild single vessel coronary atherosclerosis.  3. Mild dilatation of the aortic root.   DISCUSSION:  It is felt that Ms. Vanloan is not having ongoing ischemic  chest pain at this point in time.                                               Colleen Can. Deborah Chalk, M.D.    SNT/MEDQ  D:  06/23/2004  T:  06/24/2004  Job:  [id number]

## 2011-05-18 NOTE — Op Note (Signed)
NAME:  [name], [name]                     ACCOUNT NO.:  [id number]   MEDICAL RECORD NO.:  [id number]                   PATIENT TYPE:  AMB   LOCATION:  DSC                                  FACILITY:  [facility]   PHYSICIAN:  Abigail Miyamoto, M.D.              DATE OF BIRTH:  24-Feb-1926   DATE OF PROCEDURE:  08/31/2004  DATE OF DISCHARGE:                                 OPERATIVE REPORT   PREOPERATIVE DIAGNOSIS:  Headaches.   POSTOPERATIVE DIAGNOSIS:  Headaches.   PROCEDURE:  Right temporal artery biopsy.   SURGEON:  Abigail Miyamoto, M.D.   ANESTHESIA:  1% lidocaine.   ESTIMATED BLOOD LOSS:  Minimal.   PROCEDURE IN DETAIL:  The patient was brought to the operating room and  identified as [name].  She was placed supine on the operating  room table and then her head was turned to the right.  The area in front of  her right ear was then prepped and draped in the usual sterile fashion.  The  skin was then anesthetized with 1% lidocaine.  A small longitudinal incision  was made in front of the ear with a #15 blade.  The incision was carried  down through the fascia with the electrocautery.  The temporal artery and  vein were then easily identified.  The artery was dissected free and clamped  with a hemostat proximally and distally and then transected, and a segment  was sent to pathology for identification.  The two ends were then tied off  with 3-0 Vicryl ties.  Hemostasis appeared to be achieved.  The subcutaneous  tissue was then closed with interrupted 3-0 Monocryl sutures and the skin  was closed with running 4-0 Monocryl.  Steri-Strips were then applied.  The  patient tolerated the procedure well.  All counts were correct at the end of  the procedure.  The patient was then taken in stable condition from the  operating room to the recovery room.                                               Abigail Miyamoto, M.D.    DB/MEDQ  D:  08/31/2004  T:  09/01/2004  Job:   [id number]   cc:   Brett Canales A. Cleta Alberts, M.D.  [address]  [place]  [place] [address]  Fax: [phone]

## 2011-07-10 ENCOUNTER — Other Ambulatory Visit: Payer: Self-pay | Admitting: Cardiology

## 2011-07-10 MED ORDER — CLOPIDOGREL BISULFATE 75 MG PO TABS: 75.0000 mg | ORAL_TABLET | Freq: Every day | ORAL | Status: AC

## 2011-07-10 NOTE — Telephone Encounter (Signed)
Med refill

## 2011-10-11 ENCOUNTER — Other Ambulatory Visit: Payer: Self-pay | Admitting: *Deleted

## 2011-11-29 ENCOUNTER — Other Ambulatory Visit: Payer: Self-pay | Admitting: Emergency Medicine

## 2011-11-29 DIAGNOSIS — H539 Unspecified visual disturbance: Secondary | ICD-10-CM

## 2011-12-06 ENCOUNTER — Ambulatory Visit
Admission: RE | Admit: 2011-12-06 | Discharge: 2011-12-06 | Disposition: A | Discharge status: Home / Self Care | Payer: Medicare Other | Source: Ambulatory Visit | Attending: Emergency Medicine | Admitting: Emergency Medicine

## 2011-12-06 DIAGNOSIS — H539 Unspecified visual disturbance: Secondary | ICD-10-CM

## 2012-01-30 ENCOUNTER — Ambulatory Visit (INDEPENDENT_AMBULATORY_CARE_PROVIDER_SITE_OTHER): Payer: Medicare Other | Admitting: Emergency Medicine

## 2012-01-30 VITALS — BP 124/68 | HR 67 | Temp 97.8°F | Resp 16 | Ht 61.5 in | Wt 129.0 lb

## 2012-01-30 DIAGNOSIS — M5430 Sciatica, unspecified side: Secondary | ICD-10-CM

## 2012-01-30 DIAGNOSIS — M79672 Pain in left foot: Secondary | ICD-10-CM

## 2012-01-30 MED ORDER — HYDROCODONE-ACETAMINOPHEN 5-500 MG PO TABS: 1.0000 | ORAL_TABLET | Freq: Three times a day (TID) | ORAL | Status: AC | PRN

## 2012-01-30 NOTE — Patient Instructions (Signed)

## 2012-01-30 NOTE — Progress Notes (Signed)
  Subjective:    Patient ID: [name], female    DOB: 1926-04-24, 78 y.o.   MRN: [id number]  HPI1 to 2 week history of pain and discomfort left buttocks and left heel    Review of Systems  Musculoskeletal: Positive for back pain and gait problem.  Neurological: Negative for dizziness, weakness and numbness.       Objective:   Physical Exam  Musculoskeletal:       Tender left SI joint and left heel DTR 2 plus knees          Assessment & Plan:  Suspect sciatica and left plantar fascitis

## 2012-02-06 ENCOUNTER — Telehealth: Payer: Self-pay

## 2012-02-06 NOTE — Telephone Encounter (Signed)
.  [facility] 

## 2012-02-08 ENCOUNTER — Other Ambulatory Visit: Payer: Self-pay

## 2012-02-08 NOTE — Telephone Encounter (Signed)
[name] called from [facility]. Pt is waiting for a Rx for prednisone. Called in attached Rxs for prednisone and norco as written by Dr Cleta Alberts

## 2012-02-11 ENCOUNTER — Other Ambulatory Visit: Payer: Self-pay | Admitting: *Deleted

## 2012-02-11 MED ORDER — PREDNISONE (PAK) 10 MG PO TABS: ORAL_TABLET | Freq: Every day | ORAL | Status: AC

## 2012-02-11 MED ORDER — HYDROCODONE-ACETAMINOPHEN 5-325 MG PO TABS: 0.5000 | ORAL_TABLET | ORAL | Status: AC | PRN

## 2012-02-11 NOTE — Telephone Encounter (Signed)
Dr Cleta Alberts, I believe you need to sign these medications and then close encounter.

## 2012-02-12 ENCOUNTER — Other Ambulatory Visit: Payer: Self-pay

## 2012-02-12 MED ORDER — METOPROLOL SUCCINATE ER 25 MG PO TB24: 25.0000 mg | ORAL_TABLET | Freq: Every day | ORAL | Status: DC

## 2012-02-15 ENCOUNTER — Telehealth: Payer: Self-pay

## 2012-02-15 NOTE — Telephone Encounter (Signed)
DR DAUB,  PT IS ON A CANE  SHE IS WONDERING IF THERE IS SOMETHING ELSE YOU CAN SUGGEST.  HER LEG AND HEAL ARE NOT ANY BETTER AT ALL.

## 2012-02-16 NOTE — Telephone Encounter (Signed)
SECOND CALL - PT IN PAIN - PLEASE CALL  BF

## 2012-02-17 NOTE — Telephone Encounter (Signed)
Spoke with pt advised to RTC pt agreed.

## 2012-02-17 NOTE — Telephone Encounter (Signed)
Please call C. and dO I need to see her next week to reevaluate her back pain. The other option would be a referral to one of the back doctors to see if they can help [place] in managing her pain.

## 2012-02-20 ENCOUNTER — Ambulatory Visit (INDEPENDENT_AMBULATORY_CARE_PROVIDER_SITE_OTHER): Payer: Medicare Other | Admitting: Emergency Medicine

## 2012-02-20 VITALS — BP 114/67 | HR 79 | Temp 97.6°F | Resp 16

## 2012-02-20 DIAGNOSIS — IMO0002 Reserved for concepts with insufficient information to code with codable children: Secondary | ICD-10-CM

## 2012-02-20 DIAGNOSIS — M549 Dorsalgia, unspecified: Secondary | ICD-10-CM

## 2012-02-20 DIAGNOSIS — M79609 Pain in unspecified limb: Secondary | ICD-10-CM

## 2012-02-20 MED ORDER — GABAPENTIN 300 MG PO CAPS: ORAL_CAPSULE | ORAL | Status: DC

## 2012-02-20 NOTE — Progress Notes (Signed)
  Subjective:    Patient ID: [name], female    DOB: August 06, 1926, 78 y.o.   MRN: [id number]  HPI [name] continues to have a lot of pain in her left buttocks and down her left leg. She also feels the pain into her pelvic area. She did get some initial relief with the prednisone for a couple of days and then the pain recurred. She has taken Neurontin in the past for neuropathic pain. She is okay with trying this again.    Review of Systems noncontributory     Objective:   Physical Exam  Constitutional: She is oriented to person, place, and time. She appears well-developed and well-nourished.  Neurological: She is alert and oriented to person, place, and time. She displays abnormal reflex. No cranial nerve deficit.       Patient has diminished reflexes in both ankles and both knees. Some mild weakness to dorsiflexion of the left foot          Assessment & Plan:  Will try starting Neurontin on increasing doses to see if she can get some relief of this pain. Will refill her Norco for pain. Schedule an MRI of LS spine to see if there is a disc hitting on the nerve.

## 2012-02-28 ENCOUNTER — Ambulatory Visit
Admission: RE | Admit: 2012-02-28 | Discharge: 2012-02-28 | Disposition: A | Discharge status: Home / Self Care | Payer: Medicare Other | Source: Ambulatory Visit | Attending: Emergency Medicine | Admitting: Emergency Medicine

## 2012-02-28 DIAGNOSIS — M549 Dorsalgia, unspecified: Secondary | ICD-10-CM

## 2012-02-29 ENCOUNTER — Other Ambulatory Visit: Payer: Self-pay | Admitting: Emergency Medicine

## 2012-02-29 ENCOUNTER — Telehealth: Payer: Self-pay | Admitting: Radiology

## 2012-02-29 DIAGNOSIS — M545 Low back pain: Secondary | ICD-10-CM

## 2012-02-29 NOTE — Telephone Encounter (Signed)
Message copied by Levon Hedger A on Fri Feb 29, 2012 10:25 AM ------      Message from: Lesle Chris A      Created: Thu Feb 28, 2012  4:23 PM       Please call patient. MRI is unchanged from previous. When she consider a course of physical therapy to see if this would help her pain. The option would be to see one of the back specialist to consider epidural injections. Please let her tell me what she will would prefer me to do.

## 2012-03-01 NOTE — Telephone Encounter (Signed)
ADVISED PT THAT WE ARE SCHEDULING HER FOR PHYSICAL THERAPY.  SHE ASKED ABOUT INCREASING NEURONTIN AND PER DR DAUB SHE CAN INCREASE BY 1 CAPSULE EVERY 2-3 DAYS

## 2012-03-03 ENCOUNTER — Telehealth: Payer: Self-pay

## 2012-03-26 ENCOUNTER — Telehealth: Payer: Self-pay

## 2012-03-26 NOTE — Telephone Encounter (Signed)
.  [facility] Melissa Hale,PT from [facility] called regarding patient: [name].  The patient's therapist would like to speak to Dr. Cleta Alberts regarding her therapies.  Please call Efraim Kaufmann or Duwayne Heck at [phone]

## 2012-03-31 ENCOUNTER — Other Ambulatory Visit: Payer: Self-pay | Admitting: Physician Assistant

## 2012-03-31 ENCOUNTER — Other Ambulatory Visit: Payer: Self-pay | Admitting: Emergency Medicine

## 2012-04-02 ENCOUNTER — Other Ambulatory Visit: Payer: Self-pay | Admitting: Emergency Medicine

## 2012-04-02 ENCOUNTER — Other Ambulatory Visit: Payer: Self-pay | Admitting: Physician Assistant

## 2012-04-09 ENCOUNTER — Encounter: Payer: Self-pay | Admitting: *Deleted

## 2012-05-12 ENCOUNTER — Other Ambulatory Visit: Payer: Self-pay | Admitting: *Deleted

## 2012-05-12 ENCOUNTER — Other Ambulatory Visit: Payer: Self-pay | Admitting: Emergency Medicine

## 2012-06-03 ENCOUNTER — Encounter: Payer: Self-pay | Admitting: Emergency Medicine

## 2012-06-03 ENCOUNTER — Ambulatory Visit: Payer: Medicare Other

## 2012-06-03 ENCOUNTER — Ambulatory Visit (INDEPENDENT_AMBULATORY_CARE_PROVIDER_SITE_OTHER): Payer: Medicare Other | Admitting: Emergency Medicine

## 2012-06-03 VITALS — BP 138/64 | HR 61 | Temp 97.4°F | Resp 16 | Ht 61.5 in | Wt 128.0 lb

## 2012-06-03 DIAGNOSIS — M25549 Pain in joints of unspecified hand: Secondary | ICD-10-CM

## 2012-06-03 DIAGNOSIS — K219 Gastro-esophageal reflux disease without esophagitis: Secondary | ICD-10-CM | POA: Insufficient documentation

## 2012-06-03 DIAGNOSIS — E7800 Pure hypercholesterolemia, unspecified: Secondary | ICD-10-CM | POA: Insufficient documentation

## 2012-06-03 DIAGNOSIS — R102 Pelvic and perineal pain: Secondary | ICD-10-CM

## 2012-06-03 DIAGNOSIS — M25559 Pain in unspecified hip: Secondary | ICD-10-CM

## 2012-06-03 DIAGNOSIS — I2510 Atherosclerotic heart disease of native coronary artery without angina pectoris: Secondary | ICD-10-CM | POA: Insufficient documentation

## 2012-06-03 DIAGNOSIS — E785 Hyperlipidemia, unspecified: Secondary | ICD-10-CM

## 2012-06-03 DIAGNOSIS — M545 Low back pain: Secondary | ICD-10-CM

## 2012-06-03 DIAGNOSIS — Z0000 Encounter for general adult medical examination without abnormal findings: Secondary | ICD-10-CM

## 2012-06-03 DIAGNOSIS — E782 Mixed hyperlipidemia: Secondary | ICD-10-CM

## 2012-06-03 LAB — POCT UA - MICROSCOPIC ONLY
Bacteria, U Microscopic: NEGATIVE
Casts, Ur, LPF, POC: NEGATIVE
Yeast, UA: NEGATIVE

## 2012-06-03 LAB — COMPREHENSIVE METABOLIC PANEL
ALT: 10 U/L (ref 0–35)
AST: 19 U/L (ref 0–37)
Albumin: 3.9 g/dL (ref 3.5–5.2)
Calcium: 9.3 mg/dL (ref 8.4–10.5)
Chloride: 106 mEq/L (ref 96–112)
Creat: 0.67 mg/dL (ref 0.50–1.10)
Potassium: 4.4 mEq/L (ref 3.5–5.3)
Sodium: 142 mEq/L (ref 135–145)
Total Protein: 6.1 g/dL (ref 6.0–8.3)

## 2012-06-03 LAB — LIPID PANEL
HDL: 53 mg/dL (ref >39)
LDL Cholesterol: 88 mg/dL (ref 0–99)

## 2012-06-03 LAB — POCT URINALYSIS DIPSTICK
Glucose, UA: NEGATIVE
Ketones, UA: NEGATIVE
Leukocytes, UA: NEGATIVE
Protein, UA: NEGATIVE
Urobilinogen, UA: 0.2

## 2012-06-03 LAB — CBC WITH DIFFERENTIAL/PLATELET
Basophils Absolute: 0.1 10*3/uL (ref 0.0–0.1)
Lymphocytes Relative: 25 % (ref 12–46)
Neutro Abs: 3 10*3/uL (ref 1.7–7.7)
Platelets: 247 10*3/uL (ref 150–400)
RDW: 13.4 % (ref 11.5–15.5)
WBC: 4.9 10*3/uL (ref 4.0–10.5)

## 2012-06-03 NOTE — Progress Notes (Signed)
  Subjective:    Patient ID: [name], female    DOB: August 15, 1926, 78 y.o.   MRN: [id number]  HPI patient is here for an exam. She has really struggled with back and hip problems.    Review of Systems  Constitutional: Negative.   HENT: Negative.   Eyes: Negative.   Respiratory: Negative.   Cardiovascular: Negative.   Gastrointestinal: Negative.   Genitourinary: Positive for pelvic pain.       She is having discomfort in her pelvic area. She is status post hysterectomy but is concerned that there is some abnormality originating from her pelvis .  Musculoskeletal:       She has struggled with significant back pain and pain into her left leg and hip. She has been undergoing physical therapy for over a month and is to the point where now she is continuing her therapy at home she does have to walk with the assistance of a cane  Skin: Negative.   Neurological:       She has a history of thoracic syringomyelia.  Hematological: Negative.   Psychiatric/Behavioral: Negative.        Objective:   Physical Exam  Constitutional: She appears well-developed and well-nourished.  HENT:  Head: Normocephalic.  Right Ear: External ear normal.  Left Ear: External ear normal.  Eyes: Pupils are equal, round, and reactive to light.  Neck: Normal range of motion. Neck supple. No tracheal deviation present. No thyromegaly present.  Cardiovascular: Normal rate and regular rhythm.  Exam reveals no gallop and no friction rub.   Murmur heard. Pulmonary/Chest: No respiratory distress. She has no wheezes. She has no rales. She exhibits no tenderness.  Abdominal: Soft. She exhibits no distension and no mass. There is no tenderness. There is no rebound and no guarding.  Genitourinary: Vagina normal.       She is status post hysterectomy. The vaginal vault reveals the mucosa to be seen in atrophic. There are no vulvar lesions seen. A Pap was done  Musculoskeletal:       There is mild discomfort over  the lower lumbar spine straight leg raising continues to be positive on the left she has fairly normal range of motion of both hips.  Neurological: She is alert.  Skin: Skin is warm and dry.   EKG shows low voltage in the lateral leads otherwise unremarkable  [facility] reading (PRIMARY) by  Dr. Cleta Alberts  chest x-ray shows no acute disease minimal right infrahilar increased markings. Hip films are normal for age  .      Assessment & Plan:  Roberto is stable at present on current regimen. We'll make no changes. She is to continue her regular physical therapy she is doing at home . She's currently using a cane for assistance with walking.

## 2012-06-04 ENCOUNTER — Telehealth: Payer: Self-pay

## 2012-06-04 NOTE — Telephone Encounter (Signed)
Copy sent

## 2012-06-04 NOTE — Telephone Encounter (Signed)
Pt is requesting we mail her latest labs to her home.

## 2012-06-06 LAB — PAP IG (IMAGE GUIDED)

## 2012-06-16 ENCOUNTER — Other Ambulatory Visit: Payer: Self-pay | Admitting: Emergency Medicine

## 2012-06-18 ENCOUNTER — Other Ambulatory Visit: Payer: Self-pay | Admitting: Physician Assistant

## 2012-07-04 ENCOUNTER — Other Ambulatory Visit: Payer: Self-pay | Admitting: Internal Medicine

## 2012-07-09 ENCOUNTER — Encounter: Payer: Self-pay | Admitting: Emergency Medicine

## 2012-07-09 ENCOUNTER — Ambulatory Visit (INDEPENDENT_AMBULATORY_CARE_PROVIDER_SITE_OTHER): Payer: Medicare Other | Admitting: Emergency Medicine

## 2012-07-09 VITALS — BP 118/54 | HR 68 | Temp 98.6°F | Resp 16 | Ht 61.0 in | Wt 129.0 lb

## 2012-07-09 DIAGNOSIS — R300 Dysuria: Secondary | ICD-10-CM

## 2012-07-09 DIAGNOSIS — N3000 Acute cystitis without hematuria: Secondary | ICD-10-CM

## 2012-07-09 LAB — POCT URINALYSIS DIPSTICK
Bilirubin, UA: NEGATIVE
Glucose, UA: NEGATIVE
Ketones, UA: NEGATIVE
Nitrite, UA: NEGATIVE
Protein, UA: NEGATIVE
Spec Grav, UA: 1.015
Urobilinogen, UA: 0.2
pH, UA: 8.5

## 2012-07-09 LAB — POCT UA - MICROSCOPIC ONLY
Casts, Ur, LPF, POC: NEGATIVE
Crystals, Ur, HPF, POC: NEGATIVE
Mucus, UA: NEGATIVE
Yeast, UA: NEGATIVE

## 2012-07-09 MED ORDER — PHENAZOPYRIDINE HCL 200 MG PO TABS: 200.0000 mg | ORAL_TABLET | Freq: Three times a day (TID) | ORAL | Status: AC | PRN

## 2012-07-09 MED ORDER — CIPROFLOXACIN HCL 500 MG PO TABS: 500.0000 mg | ORAL_TABLET | Freq: Two times a day (BID) | ORAL | Status: AC

## 2012-07-09 NOTE — Progress Notes (Signed)
  Subjective:    Patient ID: [name], female    DOB: Mar 09, 1926, 78 y.o.   MRN: [id number]  Dysuria  This is a new problem. The current episode started in the past 7 days. The problem occurs every urination. The problem has been unchanged. The quality of the pain is described as burning. The pain is mild. She is not sexually active. There is no history of pyelonephritis. Associated symptoms include chills, frequency and urgency. Pertinent negatives include no discharge, flank pain, hematuria, hesitancy, nausea, possible pregnancy, sweats or vomiting. She has tried nothing for the symptoms. There is no history of catheterization, kidney stones, recurrent UTIs, a single kidney, urinary stasis or a urological procedure.      Review of Systems  Constitutional: Positive for chills.  HENT: Negative.   Eyes: Negative.   Respiratory: Negative.   Cardiovascular: Negative.   Gastrointestinal: Negative.  Negative for nausea and vomiting.  Genitourinary: Positive for dysuria, urgency and frequency. Negative for hesitancy, hematuria and flank pain.  Musculoskeletal: Negative.   Neurological: Negative.        Objective:   Physical Exam  Constitutional: She is oriented to person, place, and time. She appears well-developed and well-nourished.  HENT:  Head: Normocephalic and atraumatic.  Eyes: Conjunctivae are normal. Pupils are equal, round, and reactive to light.  Neck: Normal range of motion.  Cardiovascular: Normal rate.   Pulmonary/Chest: Effort normal.  Abdominal: Soft.  Musculoskeletal: Normal range of motion.  Neurological: She is alert and oriented to person, place, and time.  Skin: Skin is warm and dry.          Assessment & Plan:   Results for orders placed in visit on 07/09/12  POCT UA - MICROSCOPIC ONLY      Component Value Range   WBC, Ur, HPF, POC TNTC     RBC, urine, microscopic 15-18     Bacteria, U Microscopic 2+     Mucus, UA neg     Epithelial cells,  urine per micros 0-2     Crystals, Ur, HPF, POC neg     Casts, Ur, LPF, POC neg     Yeast, UA neg    POCT URINALYSIS DIPSTICK      Component Value Range   Color, UA yellow     Clarity, UA cloudy     Glucose, UA neg     Bilirubin, UA neg     Ketones, UA neg     Spec Grav, UA 1.015     Blood, UA moderate     pH, UA 8.5     Protein, UA neg     Urobilinogen, UA 0.2     Nitrite, UA neg     Leukocytes, UA large (3+)      Cystitis Bactrim Pyridium Follow up as needed

## 2012-07-09 NOTE — Patient Instructions (Addendum)

## 2012-07-29 ENCOUNTER — Observation Stay (HOSPITAL_COMMUNITY)
Admission: EM | Admit: 2012-07-29 | Discharge: 2012-07-31 | DRG: 313 | Disposition: A | Discharge status: Home / Self Care | Payer: Medicare Other | Attending: Family Medicine | Admitting: Family Medicine

## 2012-07-29 ENCOUNTER — Encounter (HOSPITAL_COMMUNITY): Payer: Self-pay | Admitting: *Deleted

## 2012-07-29 ENCOUNTER — Emergency Department (HOSPITAL_COMMUNITY): Payer: Medicare Other

## 2012-07-29 DIAGNOSIS — Z7902 Long term (current) use of antithrombotics/antiplatelets: Secondary | ICD-10-CM

## 2012-07-29 DIAGNOSIS — M810 Age-related osteoporosis without current pathological fracture: Secondary | ICD-10-CM | POA: Diagnosis present

## 2012-07-29 DIAGNOSIS — Z87891 Personal history of nicotine dependence: Secondary | ICD-10-CM

## 2012-07-29 DIAGNOSIS — K219 Gastro-esophageal reflux disease without esophagitis: Secondary | ICD-10-CM | POA: Diagnosis present

## 2012-07-29 DIAGNOSIS — R0789 Other chest pain: Principal | ICD-10-CM | POA: Diagnosis present

## 2012-07-29 DIAGNOSIS — E7800 Pure hypercholesterolemia, unspecified: Secondary | ICD-10-CM | POA: Diagnosis present

## 2012-07-29 DIAGNOSIS — Z888 Allergy status to other drugs, medicaments and biological substances status: Secondary | ICD-10-CM

## 2012-07-29 DIAGNOSIS — I2510 Atherosclerotic heart disease of native coronary artery without angina pectoris: Secondary | ICD-10-CM | POA: Diagnosis present

## 2012-07-29 DIAGNOSIS — Z886 Allergy status to analgesic agent status: Secondary | ICD-10-CM

## 2012-07-29 DIAGNOSIS — Z880 Allergy status to penicillin: Secondary | ICD-10-CM

## 2012-07-29 DIAGNOSIS — I77810 Thoracic aortic ectasia: Secondary | ICD-10-CM | POA: Diagnosis present

## 2012-07-29 DIAGNOSIS — I498 Other specified cardiac arrhythmias: Secondary | ICD-10-CM | POA: Diagnosis present

## 2012-07-29 DIAGNOSIS — R079 Chest pain, unspecified: Secondary | ICD-10-CM | POA: Diagnosis present

## 2012-07-29 DIAGNOSIS — I200 Unstable angina: Secondary | ICD-10-CM

## 2012-07-29 DIAGNOSIS — E785 Hyperlipidemia, unspecified: Secondary | ICD-10-CM | POA: Diagnosis present

## 2012-07-29 DIAGNOSIS — Z881 Allergy status to other antibiotic agents status: Secondary | ICD-10-CM

## 2012-07-29 DIAGNOSIS — Z79899 Other long term (current) drug therapy: Secondary | ICD-10-CM

## 2012-07-29 HISTORY — DX: Age-related osteoporosis without current pathological fracture: M81.0

## 2012-07-29 HISTORY — DX: Essential (primary) hypertension: I10

## 2012-07-29 HISTORY — DX: Atherosclerotic heart disease of native coronary artery without angina pectoris: I25.10

## 2012-07-29 HISTORY — DX: Noninfective gastroenteritis and colitis, unspecified: K52.9

## 2012-07-29 HISTORY — DX: Aortic ectasia, unspecified site: I77.819

## 2012-07-29 LAB — URINALYSIS, ROUTINE W REFLEX MICROSCOPIC
Nitrite: NEGATIVE
Specific Gravity, Urine: 1.022 (ref 1.005–1.030)
Urobilinogen, UA: 0.2 mg/dL (ref 0.0–1.0)
pH: 7 (ref 5.0–8.0)

## 2012-07-29 LAB — CARDIAC PANEL(CRET KIN+CKTOT+MB+TROPI)
CK, MB: 2.3 ng/mL (ref 0.3–4.0)
Relative Index: INVALID (ref 0.0–2.5)
Total CK: 48 U/L (ref 7–177)
Troponin I: <0.3 ng/mL (ref <0.30)

## 2012-07-29 LAB — CBC WITH DIFFERENTIAL/PLATELET
Basophils Relative: 1 % (ref 0–1)
Hemoglobin: 14.2 g/dL (ref 12.0–15.0)
Lymphocytes Relative: 22 % (ref 12–46)
MCHC: 33.6 g/dL (ref 30.0–36.0)
Monocytes Relative: 13 % — ABNORMAL HIGH (ref 3–12)
Neutro Abs: 3 10*3/uL (ref 1.7–7.7)
Neutrophils Relative %: 59 % (ref 43–77)
RBC: 4.61 MIL/uL (ref 3.87–5.11)
WBC: 5.1 10*3/uL (ref 4.0–10.5)

## 2012-07-29 LAB — BASIC METABOLIC PANEL
CO2: 29 mEq/L (ref 19–32)
Chloride: 103 mEq/L (ref 96–112)
GFR calc Af Amer: >90 mL/min (ref >90)
Potassium: 3.7 mEq/L (ref 3.5–5.1)

## 2012-07-29 LAB — URINE MICROSCOPIC-ADD ON

## 2012-07-29 LAB — POCT I-STAT TROPONIN I: Troponin i, poc: 0 ng/mL (ref 0.00–0.08)

## 2012-07-29 LAB — MRSA PCR SCREENING: MRSA by PCR: NEGATIVE

## 2012-07-29 MED ORDER — SODIUM CHLORIDE 0.9 % IJ SOLN: 3.0000 mL | INTRAMUSCULAR | Status: DC | PRN

## 2012-07-29 MED ORDER — ONDANSETRON HCL 4 MG/2ML IJ SOLN: 4.0000 mg | Freq: Four times a day (QID) | INTRAMUSCULAR | Status: DC | PRN

## 2012-07-29 MED ORDER — IOHEXOL 350 MG/ML SOLN
100.0000 mL | Freq: Once | INTRAVENOUS | Status: AC | PRN
  Administered 2012-07-29: 100 mL via INTRAVENOUS

## 2012-07-29 MED ORDER — CLOPIDOGREL BISULFATE 75 MG PO TABS
75.0000 mg | ORAL_TABLET | ORAL | Status: DC
  Administered 2012-07-29 – 2012-07-31 (×2): 75 mg via ORAL
  Filled 2012-07-29 (×2): qty 1

## 2012-07-29 MED ORDER — ATORVASTATIN CALCIUM 10 MG PO TABS
10.0000 mg | ORAL_TABLET | Freq: Every day | ORAL | Status: DC
  Administered 2012-07-30: 10 mg via ORAL
  Filled 2012-07-29 (×2): qty 1

## 2012-07-29 MED ORDER — ACETAMINOPHEN 325 MG PO TABS: 650.0000 mg | ORAL_TABLET | Freq: Four times a day (QID) | ORAL | Status: DC | PRN

## 2012-07-29 MED ORDER — SODIUM CHLORIDE 0.9 % IV SOLN
INTRAVENOUS | Status: DC
  Administered 2012-07-29 – 2012-07-30 (×2): via INTRAVENOUS
  Administered 2012-07-30: 1000 mL via INTRAVENOUS

## 2012-07-29 MED ORDER — ACETAMINOPHEN 650 MG RE SUPP: 650.0000 mg | Freq: Four times a day (QID) | RECTAL | Status: DC | PRN

## 2012-07-29 MED ORDER — NITROGLYCERIN IN D5W 200-5 MCG/ML-% IV SOLN
2.0000 ug/min | Freq: Once | INTRAVENOUS | Status: AC
  Administered 2012-07-29: 5 ug/min via INTRAVENOUS
  Filled 2012-07-29: qty 250

## 2012-07-29 MED ORDER — SODIUM CHLORIDE 0.9 % IV SOLN: 250.0000 mL | INTRAVENOUS | Status: DC | PRN

## 2012-07-29 MED ORDER — PANTOPRAZOLE SODIUM 40 MG PO TBEC
40.0000 mg | DELAYED_RELEASE_TABLET | Freq: Every day | ORAL | Status: DC
  Administered 2012-07-29 – 2012-07-31 (×3): 40 mg via ORAL
  Filled 2012-07-29 (×3): qty 1

## 2012-07-29 MED ORDER — SODIUM CHLORIDE 0.9 % IJ SOLN
3.0000 mL | Freq: Two times a day (BID) | INTRAMUSCULAR | Status: DC
  Administered 2012-07-30: 3 mL via INTRAVENOUS

## 2012-07-29 MED ORDER — LORAZEPAM 1 MG PO TABS
0.5000 mg | ORAL_TABLET | Freq: Every evening | ORAL | Status: DC | PRN
  Administered 2012-07-29 – 2012-07-30 (×2): 0.5 mg via ORAL
  Filled 2012-07-29: qty 1
  Filled 2012-07-29: qty 2

## 2012-07-29 MED ORDER — ALUM & MAG HYDROXIDE-SIMETH 200-200-20 MG/5ML PO SUSP: 30.0000 mL | Freq: Four times a day (QID) | ORAL | Status: DC | PRN

## 2012-07-29 MED ORDER — ASPIRIN 81 MG PO CHEW
162.0000 mg | CHEWABLE_TABLET | Freq: Once | ORAL | Status: DC
  Filled 2012-07-29: qty 2

## 2012-07-29 MED ORDER — ONDANSETRON HCL 4 MG PO TABS: 4.0000 mg | ORAL_TABLET | Freq: Four times a day (QID) | ORAL | Status: DC | PRN

## 2012-07-29 NOTE — H&P (Signed)
[facility] Admission History and Physical Service Pager: [phone]  Patient name: [name] Medical record number: [id number] Date of birth: November 20, 1926 Age: 78 y.o. Gender: female  Primary Care Provider: Lucilla Edin, MD  Chief Complaint: chest pain  Assessment and Plan: [name] is a 78 y.o. year old female with PMH significant for known CAD (on Plavix 75 mg, taking every other day due to ASA intolerance), GERD, and HLD presenting with intermittent chest pain for 2-3 days with radiation to back. Admitting on 7/30.  1. Chest pain - Pt reports 2-3 days of intermittent substernal pain with radiation straight through to back, described as "dull uncomfortable feeling" more than frank pain. At time of interview/exam pt described active "on and off" sensation of pain lasting seconds at a time. Also denies severe pain or tearing sensation. Usually with exertion but sometimes at rest; pt did walk 30 minutes 7/29 without any of this pain. No aspirin given in the ED due to pt intolerance. EKG unchanged from previous tracings. Pt's PCP contacted while pt in the ED, suggested admission for stress test as pt does not meet criteria for further work-up in the ED. Given heartburn-like symptoms, possible GERD component, but with active chest pain and radiation to back, must also consider atypical presentation of aortic dissection. Discussed with ED physician, who will start nitro drip and order chest CT to evaluate for dissection. PLAN - Admit to stepdown with cardiac monitoring. Cycle CEx3. Will follow up dissection protocol CT and consult cardiology. Holding beta blocker due to possible stress test tomorrow.   2. CAD - Pt reports previous cath with "about a 50% blockage in one artery" in 2005. Unable to find record in EMR, though note from adenosine nuclear stress test does mention cath in 2005 without any further details; that stress test was read as normal by Dr.  Laverda Sorenson. Pt unable to tolerate aspirin so is on Plavix 75 mg, but only taking every other day due to heavy bruising when taking daily, previously. On Toprol XL 25 mg, but states she takes 1 half tab at home. PLAN - Continue home meds; currently holding beta blocker, as above. May need to address taking half an extended-release tablet at discharge/as outpt.  3. GERD - Per pt history on Protonix 40 mg daily at home. Symptoms on and off, as above; pt does describe at least two episodes of chest pain such as above relieved by belching. Some relief with Zantac at least once in the past couple of days, as well. PLAN - Continue Protonix 40 mg daily. GI cocktail q6h PRN.  4. HLD - On Crestor 10 mg at home. Last lipid panel in June 2013 shows TG 106, HDL 53, LDL 88. Continue med therapy, Lipitor 10 mg here, per formulary.  FEN/GI: NS at 75 mL. Protonix. Prophylaxis: SCDs; no anticoagulants yet other than Plavix given potential for aortic dissection Other PRN: Tylenol (pain), Ativan qHS (sleep; home med) Disposition: home pending work-up/improvement Code Status: Full, but pt does not wish to be in a persistent vegetative state if providers feel she will need long-term life-support  History of Present Illness: [name] is a 78 y.o. year old female with PMH significant for known CAD (on Plavix 75 mg, taking every other day due to ASA intolerance), GERD, and HLD presenting with intermittent chest pain for 2-3 days with radiation to her back. She states she has never had symptoms like this before, but does state that  she had a cardiac cath "several years ago," that showed "about a 40% blockage in one artery," which is why she takes Plavix 75 mg (only every other day because of bruising), because she can't tolerate ASA (makes her feel like she's choking). She describes her current pain as "dull, uncomfortable" feeling in her chest that is accompanied by pain radiating through to her back. She describes  several episodes the past few days, 3-4 times per day at various times, both while she is "up and about and moving around," and during what sounds like at rest or non-strenuous activities (standing at a stove cooking). Pt does describe at least some of these episodes as feeling like heartburn and states they have been relieved by belching. The first episode (she believes) was while she was cooking and felt like "a burning in her throat that was like being on fire" that lasted for several minutes. She had relief from this episode with Zantac, drinking a small amount of Sprite, and belching. She does describe walking for 30 straight minutes yesterday, which is her usual daily exercise, without any such symptoms. She denies any sweating, SOB, or nausea/vomiting during any of these episodes.  The latest episode of her pain was today from about 10 this morning until about 4:45 PM (about 1 hour prior to this interview), again "on and off" with the dull chest pain with some burning sensation and radiation to her back, which is why she came to the hospital at the urging of her daughter, who is a PA. In the ED, during this interview, she describes active "spurts" of similar pain, while she talks and while she is being examined, intermittently.  Otherwise she denies any PND and occasionally sleeps on only one pillow, but this is rarely. Denies any swelling in her legs, old or new. Does occasionally have some crampy pain in her calves, but none currently. Does endorse occasional heartburn (described as "regular heartburn, not the chest discomfort like this"), for which she takes Protonix and sometimes Zantac/TUMs.  Patient Active Problem List  Diagnosis  . Hypercholesteremia  . GERD (gastroesophageal reflux disease)  . CAD (coronary artery disease)  . Chest pain   Past Medical History: Past Medical History  Diagnosis Date  . Coronary atherosclerosis   . Hypercholesteremia   . GERD (gastroesophageal reflux  disease)    Past Surgical History: Past Surgical History  Procedure Date  . Back surgery   . Hysterectomy -- unknown type    Social History: History  Substance Use Topics  . Smoking status: Former Games developer  . Smokeless tobacco: Not on file  . Alcohol Use: 0.6 oz/week    1 Glasses of wine per week     occ   For any additional social history documentation, please refer to relevant sections of EMR.  Family History: Family History  Problem Relation Age of Onset  . Heart disease    . Heart failure     Allergies: Allergies  Allergen Reactions  . Aspirin Other (See Comments)    Feels like choking  . Bactrim   . Niacin And Related   . Penicillins   . Relafen (Nabumetone)   . Tetracyclines & Related    No current facility-administered medications on file prior to encounter.   Current Outpatient Prescriptions on File Prior to Encounter  Medication Sig Dispense Refill  . Calcium Carbonate-Vitamin D (CALCIUM + D PO) Take 1 tablet by mouth every morning.       . cholecalciferol (VITAMIN D)  1000 UNITS tablet Take 1,000 Units by mouth daily.      . Multiple Vitamins-Minerals (MULTIVITAMIN PO) Take 1 tablet by mouth daily.       Marland Kitchen DISCONTD: metoprolol succinate (TOPROL-XL) 25 MG 24 hr tablet Take 1 tablet (25 mg total) by mouth daily. Pt takes half a pill daily  90 tablet  0   Review Of Systems: See HPI.  Physical Exam: BP 143/88  Pulse 66  Temp 97.8 F (36.6 C) (Oral)  Resp 14  SpO2 99% Exam: General: elderly female sitting up in chair, very pleasant and appropriate in conversation HEENT: MMM, no JVD Cardiovascular: RRR, very soft subtle blowing systolic murmur Respiratory: CTAB, other than soft scattered crackles at bases clearing with deep breathing Abdomen: soft, nontender, no epigastric tenderness/burning Extremities: moves all extremities equally/spontaneously, distal pulses 2+ and equal bilaterally in upper and lower extremities Skin: warm, dry, intact Neuro:  grossly intact, alert/oriented x4  Labs and Imaging: CBC BMET   Lab 07/29/12 1500  WBC 5.1  HGB 14.2  HCT 42.3  PLT 215    Lab 07/29/12 1500  NA 141  K 3.7  CL 103  CO2 29  BUN 11  CREATININE 0.59  GLUCOSE 104*  CALCIUM 10.0    POC troponin negative CXR: no acute cardiopulmonary abnormality, no acutely widened mediastinum EKG: unchanged from previous tracing in June 2013  Maryjean Ka, MD 07/29/2012, 6:40 PM    [facility] Addendum  Patient name: [name] MRN [id number]  Date of birth: [birth year]  Brief CC & HPI  [name] is a 78 y.o. female presenting today with a two-day history of midsternal chest pain that is radiating to her back and described as a pressure that becomes sharp at her back.  She reports this pain occurs throughout the day 3-4 times in the last for sometimes up to 20 minutes at a time.  There is no exertional component.  She denies any orthostasis, presyncope or syncopal episodes.  She reports walking for 30 minutes today and did not have any of this pain.  There is no association with food intake.  She does report GERD-like symptoms and belching that her briefly relieves her pain.  ROS  Per history of present illness  Pertinent History Reviewed  Medical & Surgical Hx:  Reviewed: Significant for coronary artery disease, hypercholesterolemia, GERD, hypertension.  Previous stress test that was negative in 2008.  Carotid Plaque with ~50-65% stenosis. 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  Examined in [facility].  In no discomfort; norespiratory distress.   PSYCH: Alert and appropriately interactive; Oriented X4; Insight:Good   H&N: AT/[place], MMM, no scleral icterus, EOMi THORAX: HEART: RRR, S1/S2 heard, soft systolic I/VI blowing murmur heard best at L 5th intercostal space LUNGS: CTA B, no wheezes, no crackles EXTREMITIES: Moves all 4 extremities spontaneously, warm well perfused, no edema, bilateral DP and PT pulses 2/4.  Assessment & Plan  [name] is an 78 year old Caucasian female with past medical history significant for coronary artery disease,  hyperlipidemia and GERD, who presents today with midsternal chest pain radiating to her back.    # Chest Pain: (CAD, consider unstable angina)   - patient with intermittent midsternal chest pain radiating to her back.  Nonexertional.   - Patient with intermittent episodes lasting approximately 6 hours prior to her presentation.   - Patient experienced chest pain episode while being interviewed start her on nitroglycerin with subsequent improvement and complete resolution.   - Port of care troponins negative, with no evidence of EKG changes  - Pt intolerant to ASA -  On Plavix for primary prevention       [facility] Course  *Plavix q day   CT scan today shows Coronary Artery calcifications, mitral valve calcifications, aortic ectasia, no evidence of aortic dissection         Plan / Followup   [ ]   continue to monitor closely for any chest pain.  If chest pain returns will start heparin as well as nitroglycerin drip for ACS.   [ ]  Cycle cardiac enzymes  [ ]  Risk stratification labs.  Lipid panel obtained less than one month ago  [ ]  Cardiology consult in AM    # GERD:    - Pt has taken GERD medications on and off for an  extended period.  She feels this pain is typical for her GERD        [facility] Course  *Protonix 40mg  po *GI Cocktail q6h prn  Continued on Home GERD        Plan / Followup   Continue home regimen - r/o cardiac causes of chest pain    # ENDO: (HLD)   - On crestor 10mg  - Last lipid panel in June 2013 shows TG 106, HDL 53, LDL 88       [facility] Course  *Lipitor  A1c - pending        Plan / Followup   A1c Pending, continue home meds    Dispo:   I agree with the plan by Dr. Casper Harrison.  She'll be admitted and monitored throughout the night with cardiac enzymes cycling.  Cardiology will be consulted in the morning for further evaluation including stress test or cardiac catheterization if patient with persistent chest pain.  If she does continue to have pain or cardiac enzymes turned positive, will start her on ACS protocol including heparin and nitroglycerin.  Andrena Mews, DO [facility] Resident - PGY-2 07/29/2012 11:31 PM

## 2012-07-29 NOTE — ED Provider Notes (Signed)
History     CSN: [id number]  Arrival date & time 07/29/12  1342   First MD Initiated Contact with Patient 07/29/12 1455      Chief Complaint  Patient presents with  . Chest Pain    (Consider location/radiation/quality/duration/timing/severity/associated sxs/prior treatment) HPI Comments: Pt with hx of HTN, no known CAD (2005 cath - negative). Pt comes in with cco f chest pain x 2 days. The pain typically comes about when she is is doing something and improves with rest. The pain however is not present with all exertions. There is no associated n/v/f/c/diophoresis. Pt reports that the (ain is midsternal and sometimes she feels the pain in the back as well. There is no cough. Pt has no numbness, tingling, weakness associated with the pain. She has been taking all her meds, and is not a smoker.  Patient is a 78 y.o. female presenting with chest pain. The history is provided by the patient and medical records.  Chest Pain Pertinent negatives for primary symptoms include no shortness of breath, no cough, no wheezing, no abdominal pain, no nausea and no vomiting.     Past Medical History  Diagnosis Date  . Coronary atherosclerosis   . Hypercholesteremia   . GERD (gastroesophageal reflux disease)     Past Surgical History  Procedure Date  . Back surgery   . Hysterectomy -- unknown type     Family History  Problem Relation Age of Onset  . Heart disease    . Heart failure      History  Substance Use Topics  . Smoking status: Former Games developer  . Smokeless tobacco: Not on file  . Alcohol Use: 0.6 oz/week    1 Glasses of wine per week     occ    OB History    Grav Para Term Preterm Abortions TAB SAB Ect Mult Living                  Review of Systems  Constitutional: Negative for activity change and appetite change.  HENT: Negative for facial swelling and neck pain.   Respiratory: Negative for cough, shortness of breath and wheezing.   Cardiovascular: Positive for chest  pain.  Gastrointestinal: Negative for nausea, vomiting, abdominal pain, diarrhea, constipation, blood in stool and abdominal distention.  Genitourinary: Negative for hematuria and difficulty urinating.  Skin: Negative for color change.  Neurological: Negative for speech difficulty.  Hematological: Does not bruise/bleed easily.  Psychiatric/Behavioral: Negative for confusion.    Allergies  Aspirin; Bactrim; Niacin and related; Penicillins; Relafen; and Tetracyclines & related  Home Medications   Current Outpatient Rx  Name Route Sig Dispense Refill  . ANUSOL-HC 2.5 % RE CREA  USE AS DIRECTED. 30 g 5  . ATIVAN 0.5 MG PO TABS  TAKE (1) TABLET DAILY AS NEEDED. 30 each 0  . CALCIUM + D PO Oral Take by mouth.    Marland Kitchen VITAMIN D 1000 UNITS PO TABS Oral Take 1,000 Units by mouth daily.    Marland Kitchen METOPROLOL SUCCINATE ER 25 MG PO TB24 Oral Take 1 tablet (25 mg total) by mouth daily. Pt takes half a pill daily 90 tablet 0  . MULTIVITAMIN PO Oral Take by mouth.      BP 127/67  Pulse 70  Temp 97.8 F (36.6 C) (Oral)  Resp 16  SpO2 94%  Physical Exam  Constitutional: She is oriented to person, place, and time. She appears well-developed.  HENT:  Head: Normocephalic and atraumatic.  Eyes: Conjunctivae and  EOM are normal. Pupils are equal, round, and reactive to light.  Neck: Normal range of motion. Neck supple.  Cardiovascular: Normal rate, regular rhythm and normal heart sounds.   Pulmonary/Chest: Effort normal and breath sounds normal. No respiratory distress.  Abdominal: Soft. Bowel sounds are normal. She exhibits no distension. There is no tenderness. There is no rebound and no guarding.  Neurological: She is alert and oriented to person, place, and time.  Skin: Skin is warm and dry.    ED Course  Procedures (including critical care time)  Labs Reviewed  BASIC METABOLIC PANEL - Abnormal; Notable for the following:    Glucose, Bld 104 (*)     GFR calc non Af Amer 82 (*)     All other  components within normal limits  CBC WITH DIFFERENTIAL - Abnormal; Notable for the following:    Monocytes Relative 13 (*)     Eosinophils Relative 6 (*)     All other components within normal limits  POCT I-STAT TROPONIN I  URINALYSIS, ROUTINE W REFLEX MICROSCOPIC   Dg Chest 2 View  07/29/2012  *RADIOLOGY REPORT*  Clinical Data: Chest and back pain.  CHEST - 2 VIEW  Comparison: 06/03/2012  Findings: Two views of the chest were obtained.  Stable appearance of the heart and mediastinum.  Lungs are clear without airspace disease or edema.  There may be scarring in the right hilum which is unchanged.  Trachea is midline.  Bony structures are intact.  IMPRESSION: No acute findings.  Original Report Authenticated By: Richarda Overlie, M.D.     No diagnosis found.    MDM  Differential diagnosis includes: ACS syndrome Aortic dissection CHF exacerbation Valvular disorder Myocarditis Pericarditis Pericardial effusion Pneumonia Pleural effusion Pulmonary edema PE Anemia Musculoskeletal pain  Pt comes in with cc of chest pain, misternal to epigastric, radiating to the back, and is exertional. She has no known CAD. Pt is resting comfortably at the moment. Initial impression is that the pais is stable angina equivalent. Other consideration includes dissection - but the pain is not constant, not excruciating, and she has no true risk factors for dissection, so pretest probability of this condition is low. EKG is WNL. TIMI score is 1 for age.   Date: 07/29/2012  Rate:70  Rhythm: normal sinus rhythm  QRS Axis: normal - addendum - Left axis deviation  Intervals: normal  ST/T Wave abnormalities: normal  Conduction Disutrbances:left anterior fascicular block  Narrative Interpretation:   Old EKG Reviewed: none available          Derwood Kaplan, MD 07/29/12 1615

## 2012-07-29 NOTE — ED Notes (Signed)
Pt is here with midepigastric discomfort for last couple of days.  Pt states it goes away with rest and comes back with exertion.  Pt reports pain in her back .  SOB on exertion

## 2012-07-30 ENCOUNTER — Encounter (HOSPITAL_COMMUNITY): Payer: Self-pay | Admitting: Physician Assistant

## 2012-07-30 DIAGNOSIS — I2510 Atherosclerotic heart disease of native coronary artery without angina pectoris: Secondary | ICD-10-CM

## 2012-07-30 DIAGNOSIS — R079 Chest pain, unspecified: Secondary | ICD-10-CM

## 2012-07-30 LAB — BASIC METABOLIC PANEL
CO2: 27 mEq/L (ref 19–32)
Calcium: 8.8 mg/dL (ref 8.4–10.5)
Glucose, Bld: 103 mg/dL — ABNORMAL HIGH (ref 70–99)
Sodium: 142 mEq/L (ref 135–145)

## 2012-07-30 LAB — CBC
MCH: 31.2 pg (ref 26.0–34.0)
MCV: 92 fL (ref 78.0–100.0)
Platelets: 172 10*3/uL (ref 150–400)
RBC: 4.1 MIL/uL (ref 3.87–5.11)

## 2012-07-30 LAB — CARDIAC PANEL(CRET KIN+CKTOT+MB+TROPI)
CK, MB: 2.3 ng/mL (ref 0.3–4.0)
Relative Index: INVALID (ref 0.0–2.5)
Relative Index: INVALID (ref 0.0–2.5)
Total CK: 45 U/L (ref 7–177)
Troponin I: <0.3 ng/mL (ref <0.30)

## 2012-07-30 LAB — HEMOGLOBIN A1C: Mean Plasma Glucose: 134 mg/dL — ABNORMAL HIGH (ref <117)

## 2012-07-30 NOTE — Progress Notes (Signed)
FMTS Attending Daily Note:  Renold Don MD  [phone] pager  Family Practice pager:  [phone] I reviewed this patient and have reviewed their chart. I have discussed this patient with the resident as well as with the admitting physician Dr. Leveda Anna. I agree with their  findings, assessment and care plan.

## 2012-07-30 NOTE — H&P (Signed)
Seen and examined.  Discussed with Dr. Berline Chough.  Agree with his management and documentation.  Briefly 78 yo female with known CAD presents with worsening chest discomfort.  She has had intermittent chest discomfort for years with no clear etiology.  She has non obstructive CAD in past evals - last radionucleotide stress test 2008.  On PPI for possible GERD.  Nl GB ultrasound in 2010.   She describe the pain as burning, substernal.  No diaphoresis or SOB.  Description is not typical angina but could be cardiac.  GI (esophagitis, PUD) more likely.  GB disease unlikely with normal LFTs and recent normal ultrasound.   I think it is prudent to consult cards - presumably for stress test given her known CAD.  She is a high functioning 86, which warrants regular work up.

## 2012-07-30 NOTE — Consult Note (Signed)
CARDIOLOGY CONSULT NOTE  Patient ID: [name], MRN: [id number], DOB/AGE: 1926/04/18 78 y.o. Admit date: 07/29/2012   Date of Consult: 07/30/2012 Primary Physician: Lucilla Edin, MD Primary Cardiologist: Previously Dr. Deborah Chalk, being seen by Dr. [place]  Chief Complaint: chest pain  HPI: [name] is an 78 y/o F with hx of nonobstructive CAD with ASA intolerance, GERD, prior mild aortic dilitation, and colitis in 12/2010 associated with transient lower GI bleeding. She presented to [facility] with complaints of chest pain. On Monday while ironing, she developed substernal chest burning which she thought was indigestion lasting several hours, unrelieved by Tums/Zantac but mostly relieved after belching from Sprite several hours into it. Since that time she has had 3-4 episodes of dull central chest discomfort occasionally radiating to her back typically occuring in the setting of "doing something" like cleaning the bathroom, lasting seconds to minutes. She denies any SOB, nausea, diaphoresis, palpitations, or syncope. However, she was also able to walk for 30 minutes yesterday without any exertional symptoms. She is unsure if these are like her prior GERD symptoms. She came to the ER yesterday at the advice of her daughter who is a PA. She received IV NTG yesterday which is currently off; she does not remember if it helped. She had an episode of CP this AM lasting a few minutes that came and went spontaneously. She is currently pain free. CEs are negative x 3 thus far. CT angio of the chest did not demonstrate presence of clinically significant PE or aortic dissection. There was mild ectasia of the ascending thoracic aorta which was seen along with coronary artery calcifications including LM, LAD, LCx. Tele thus far did show two 2-sec pauses. HR at baseline 50s-60s. Metoprolol is on hold.   Past Medical History  Diagnosis Date  . CAD (coronary artery disease)     a. Nonobstructive by  last cath in 05/2004 with 30% LAD. b. EF 65-70% 12/2010. c. Hx ASA intolerance.   . Hypercholesteremia     Hx of myalgias felt to be statin related but taking Crestor as OP  . GERD (gastroesophageal reflux disease)     a. Minimal hiatal hernia on EGD 2006, no obvious esophageal source for CP at that time.  . Hypertension   . Osteoporosis   . Aortic dilatation     Seen on cath 2005  . Colitis     12/2010 associated with transient lower GI bleeding/anemia tx with abx      Most Recent Cardiac Studies: 2D Echo 01/2011 Study Conclusions - Left ventricle: Wall thickness was increased in a pattern of mild   LVH. Systolic function was vigorous. The estimated ejection   fraction was in the range of 65% to 70%. - Mitral valve: Calcified annulus. Valve area by pressure half-time:   2.32cm\S\2.  Adenosine Myoview 2008   Did not achieve target HR so switched to pharm. Normal, no evidence of ischemia, EF 87%.   Cardiac Cath 05/2004 HEMODYNAMIC DATA: The aortic pressure was 125/54. LV was 126/7/14. There  was a questionable intracavitary gradient that would be somewhat small from  the apex to the base of the heart with the hyperdynamic small left  ventricular chamber, but it was reasonably insignificant.  CORONARY ARTERIES: The coronary arteries arise and distribute normally.  1. Right coronary artery is normal.  2. Left main coronary artery is normal.  3. Left circumflex is a moderate size vessel. It is tortuous but normal.  4. Left anterior descending:  The left anterior descending has somewhat  diffuse 30% plaque in the proximal 1/2. The distal vessel is relatively  free of disease. There is no obstructive disease in the left anterior  descending.  LEFT VENTRICULOGRAPHY: Left ventricular angiogram was performed in the RAO  position. The overall cardiac size and silhouette were normal. There was a  hyperdynamic left ventricle with an ejection fraction was 60-70%. Regional  wall motion was  normal.  The thoracic aortic root was somewhat dilated but showed no evidence of  dissection.  OVERALL IMPRESSION:  1. Normal and hyperdynamic left ventricle.  2. Mild single vessel coronary atherosclerosis.  3. Mild dilatation of the aortic root.  DISCUSSION: It is felt that [name] is not having ongoing ischemic  chest pain at this point in time.   Surgical History:  Past Surgical History  Procedure Date  . Back surgery     Disc disease  . Hysterectomy -- unknown type   . Shunt for syringomyelia 1987   . Temporal artery biopsy / ligation     Negative 2005  . Shoulder surgery      Home Meds: Prior to Admission medications   Medication Sig Start Date End Date Taking? Authorizing Provider  acetaminophen (TYLENOL) 500 MG tablet Take 500 mg by mouth every 6 (six) hours as needed. For pain   Yes Historical Provider, MD  Calcium Carbonate-Vitamin D (CALCIUM + D PO) Take 1 tablet by mouth every morning.    Yes Historical Provider, MD  cholecalciferol (VITAMIN D) 1000 UNITS tablet Take 1,000 Units by mouth daily.   Yes Historical Provider, MD  clobetasol cream (TEMOVATE) 0.05 % Apply 1 application topically 2 (two) times daily as needed. eczema   Yes Historical Provider, MD  clopidogrel (PLAVIX) 75 MG tablet Take 75 mg by mouth daily. - note she takes this EVERY OTHER DAY   Yes Historical Provider, MD  dextran 70-hypromellose (TEARS RENEWED) ophthalmic solution Place 2 drops into both eyes 3 (three) times daily as needed. Dry eyes   Yes Historical Provider, MD  hydrocortisone (ANUSOL-HC) 2.5 % rectal cream Place 1 application rectally 2 (two) times daily as needed. hemorrhoids   Yes Historical Provider, MD  LORazepam (ATIVAN) 0.5 MG tablet Take 0.5 mg by mouth at bedtime as needed. For sleep   Yes Historical Provider, MD  metoprolol succinate (TOPROL-XL) 25 MG 24 hr tablet Take 12.5 mg by mouth daily. Pt takes half a pill daily 02/12/12  Yes Morrell Riddle, PA-C  Multiple  Vitamins-Minerals (MULTIVITAMIN PO) Take 1 tablet by mouth daily.    Yes Historical Provider, MD  pantoprazole (PROTONIX) 40 MG tablet Take 40 mg by mouth daily.   Yes Historical Provider, MD  rosuvastatin (CRESTOR) 10 MG tablet Take 10 mg by mouth at bedtime.   Yes Historical Provider, MD    Inpatient Medications:     . atorvastatin  10 mg Oral q1800  . clopidogrel  75 mg Oral QODAY  . nitroGLYCERIN  2-200 mcg/min Intravenous Once  . pantoprazole  40 mg Oral Daily  . sodium chloride  3 mL Intravenous Q12H  . sodium chloride  3 mL Intravenous Q12H  . DISCONTD: aspirin  162 mg Oral Once    Allergies:  Allergies  Allergen Reactions  . Aspirin Other (See Comments)    Feels like choking  . Bactrim   . Niacin And Related   . Penicillins   . Relafen (Nabumetone)   . Tetracyclines & Related     History  Social History  . Marital Status: Married    Spouse Name: N/A    Number of Children: N/A  . Years of Education: N/A   Occupational History  . Not on file.   Social History Main Topics  . Smoking status: Former Games developer  . Smokeless tobacco: Not on file  . Alcohol Use: No  . Drug Use: No  . Sexually Active: Not on file   Other Topics Concern  . Not on file   Social History Narrative  . No narrative on file     Family History  Problem Relation Age of Onset  . Heart disease    . Heart failure     Both parents died in their 24s.  Review of Systems: General: negative for chills, fever, night sweats or weight changes.  Cardiovascular: see above Dermatological: negative for rash Respiratory: negative for cough or wheezing Urologic: negative for hematuria. No dysuria since being tx as OP for cystitis earlier this month.  Abdominal: negative for nausea, vomiting, diarrhea, bright red blood per rectum, melena, or hematemesis Neurologic: negative for visual changes, syncope, or dizziness All other systems reviewed and are otherwise negative except as noted  above.  Labs:  [facility] [place] 07/30/12 0520 07/29/12 2122  CKTOTAL 45 48  CKMB 2.3 2.3  TROPONINI <0.30 <0.30   Lab Results  Component Value Date   WBC 5.1 07/30/2012   HGB 12.8 07/30/2012   HCT 37.7 07/30/2012   MCV 92.0 07/30/2012   PLT 172 07/30/2012     Lab 07/30/12 0520  NA 142  K 3.5  CL 106  CO2 27  BUN 9  CREATININE 0.59  CALCIUM 8.8  PROT --  BILITOT --  ALKPHOS --  ALT --  AST --  GLUCOSE 103*   Lab Results  Component Value Date   CHOL 162 06/03/2012   HDL 53 06/03/2012   LDLCALC 88 06/03/2012   TRIG 106 06/03/2012   Radiology/Studies:  Dg Chest 2 View 07/29/2012  *RADIOLOGY REPORT*  Clinical Data: Chest and back pain.  CHEST - 2 VIEW  Comparison: 06/03/2012  Findings: Two views of the chest were obtained.  Stable appearance of the heart and mediastinum.  Lungs are clear without airspace disease or edema.  There may be scarring in the right hilum which is unchanged.  Trachea is midline.  Bony structures are intact.  IMPRESSION: No acute findings.  Original Report Authenticated By: Richarda Overlie, M.D.   Ct Angio Chest Aortic Dissect W &/or W/o 07/29/2012  *RADIOLOGY REPORT*  Clinical Data: History of chest pain with radiation to the back.  RADIOLOGY EXAMINATION  Comparison: No priors.  Findings:  Mediastinum: There is mild ectasia of the ascending thoracic aorta which measures up to 4.0 x 4.2 cm in diameter.  No evidence of a thoracic aortic dissection at this time.  Precontrast images demonstrate no definite crescentic areas of high attenuation associated with the wall of the thoracic aorta to suggest the presence of acute intramural hemorrhage. Heart size is borderline enlarged. There is no significant pericardial fluid, thickening or pericardial calcification.  There is atherosclerosis of the thoracic aorta, the great vessels of the mediastinum and the coronary arteries, including calcified atherosclerotic plaque in the left main, left anterior descending and left circumflex coronary  arteries. Extensive calcifications of the mitral annulus. 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 There is a small hiatal hernia. No pathologically enlarged mediastinal or hilar  and lymph nodes. Although the study was not specifically tailored for evaluation of pulmonary embolism, there is no filling defect identified within the pulmonary arterial tree to the level of the segmental pulmonary artery branches to suggest the presence of clinically relevant pulmonary embolism.  Lungs/Pleura: There is extensive cylindrical and varicose bronchiectasis in the right middle lobe, with severe architectural distortion and probable chronic collapse of the majority of the right middle lobe, likely related to remote infection.  Mild cylindrical bronchiectasis is also noted in the medial aspect of the right upper lobe to a much lesser extent.  No definite suspicious appearing pulmonary nodules or masses are identified. No acute consolidative airspace disease.  No pleural effusions. Minimal dependent atelectasis is noted in the lower lobes of the lungs bilaterally.  Upper Abdomen: Unremarkable.  Musculoskeletal: There are no aggressive appearing lytic or blastic lesions noted in the visualized portions of the skeleton.  IMPRESSION: 1.  Although there is mild ectasia of the ascending thoracic aorta (4.0 x 4.2 cm in diameter), there is no evidence to suggest acute aortic syndrome on today's examination. 2.  However, there is atherosclerosis, including left main and two- vessel coronary artery disease. 3.  Extensive post infectious scarring in the right middle lobe and to a lesser extent in the right upper lobe, as detailed above. 4. There are calcifications of the mitral annulus. Echocardiographic correlation for evaluation of potential valvular dysfunction may be warranted if clinically indicated. 5.  Persistent left superior  vena cava up (normal anatomical variant) incidentally noted.  Original Report Authenticated By: Florencia Reasons, M.D.    EKG: NSR 70bpm low boltage QRS, LAFB with left axis deviation, no significant change from prior. She does have what appears to be a P without a QRS towards end of rhythm strip but other intervals are normal. On tele she had two 2-sec pauses, asymptomatic.  Physical Exam: Blood pressure 136/106, pulse 63, temperature 97.7 F (36.5 C), temperature source Oral, resp. rate 19, height 5\' 1"  (1.549 m), weight 128 lb 12 oz (58.4 kg), SpO2 97.00%. General: Well developed, well nourished pleasant elderly WF in good spirits in no acute distress. Head: Normocephalic, atraumatic, sclera non-icteric, no xanthomas, nares are without discharge.  Neck: Negative for carotid bruits. JVD not elevated. Lungs: Coarse BS at bases but clear bilaterally to auscultation without wheezes, rales, or rhonchi. Breathing is unlabored. Heart: RRR with S1 S2. No murmurs, rubs, or gallops appreciated. Abdomen: Soft, non-tender, non-distended with normoactive bowel sounds. No hepatomegaly. No rebound/guarding. No obvious abdominal masses. Msk:  Strength and tone appear normal for age. Extremities: No clubbing or cyanosis. No edema.  Distal pedal pulses are 2+ and equal bilaterally. Neuro: Alert and oriented X 3. Moves all extremities spontaneously. Psych:  Responds to questions appropriately with a normal affect.   Assessment and Plan:   1. Chest pain with typical/atypical features 2. H/o nonobstructive CAD 2005 with ASA intolerance, with coronary artery calcifications on CT angio this admission 3. Sinus bradycardia with rare 2 sec pause 4. Mild aortic ectasia 5. GERD with hx of minimal hiatal hernia  Ms. Habermehl presents with chest pain with both typical and atypical features. Coronary calcifications are noted on CT although we cannot determine if they are obstructive from this study. Despite hours  of pain 2 days ago, cardiac enzymes are negative and EKG is essentially unchanged. Given age and no objective evidence of ischemia thus far, would favor noninvasive workup which includes stress testing. Will plan for [facility] in  AM. Agree with holding BB for now given bradycardia/mild pauses and watch on telemetry. Continue Plavix every other day as she was taking at home. Continue statin (note hx of myalgias in past). Consider increasing PPI.  Signed, Ronie Spies PA-C 07/30/2012, 10:52 AM Patient seen and examined and history reviewed. Agree with above findings and plan. Very pleasant elderly WF with history of nonobstructive CAD. Present with chest pain with mostly atypical features. Exam is unremarkable. Lungs are clear. No S3 or murmur. CTA of chest showed coronary calcification which is not at all surprising given her age and known CAD. In the absence of objective evidence of ischemia (normal Ecg and enzymes) I would favor noninvasive cardiac evaluation and we will schedule her for a Lexiscan myoview in the am. If normal I would not pursue further. If abnormal we would need to consider for cardiac cath.  Theron Arista [facility] 07/30/2012 11:58 AM

## 2012-07-30 NOTE — Progress Notes (Signed)
Family Medicine Teaching Service Daily Progress Note Intern Pager: [phone]  Patient name: [name] Medical record number: [id number] Date of birth: 11-07-1926 Age: 78 y.o. Gender: female  Primary Care Provider: Lucilla Edin, MD  Subjective: Pt states she feels okay this morning. No current chest pain; one episode for a few minutes overnight. Otherwise anxious to go home, but "wants to make sure her heart is okay, first."  Objective: Temp:  [97.4 F (36.3 C)-97.9 F (36.6 C)] 97.4 F (36.3 C) (07/31 0400) Pulse Rate:  [59-70] 59  (07/31 0400) Resp:  [14-24] 24  (07/31 0400) BP: (114-143)/(41-89) 127/44 mmHg (07/31 0400) SpO2:  [92 %-99 %] 94 % (07/31 0400) Weight:  [128 lb 12 oz (58.4 kg)-129 lb 13.6 oz (58.9 kg)] 128 lb 12 oz (58.4 kg) (07/31 0000) Exam: General: elderly female sitting up in chair, in NAD, very pleasant and cooperative with interview/exam Cardiovascular: RRR, no rub/murmur appreciated Respiratory: occasional crackles at bases, bilaterally that clear with deep cough; otherwise CTAB Abdomen: soft, nontender, nondistended, BS+ Extremities: distal pulses 2+ and symmetric in bilateral upper and lower extremities  Laboratory:  Lab 07/30/12 0520 07/29/12 1500  WBC 5.1 5.1  HGB 12.8 14.2  HCT 37.7 42.3  PLT 172 215    Lab 07/30/12 0520 07/29/12 1500  NA 142 141  K 3.5 3.7  CL 106 103  CO2 27 29  BUN 9 11  CREATININE 0.59 0.59  CALCIUM 8.8 10.0  PROT -- --  BILITOT -- --  ALKPHOS -- --  ALT -- --  AST -- --  GLUCOSE 103* 104*    Lab 07/30/12 0520 07/29/12 2122  CKTOTAL 45 48  TROPONINI <0.30 <0.30  TROPONINT -- --  CKMBINDEX -- --   TSH, 7/30: 3.334 A1c: pending  Imaging/Diagnostic Tests: CTA Chest (Aortic Dissection Protocol), 7/30 @1850 : IMPRESSION:  1. Although there is mild ectasia of the ascending thoracic aorta  (4.0 x 4.2 cm in diameter), there is no evidence to suggest acute  aortic syndrome on today's examination.  2.  However, there is atherosclerosis, including left main and two-  vessel coronary artery disease.  3. Extensive post infectious scarring in the right middle lobe and  to a lesser extent in the right upper lobe, as detailed above.  4. There are calcifications of the mitral annulus.  Echocardiographic correlation for evaluation of potential valvular  dysfunction may be warranted if clinically indicated.  5. 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  Assessment and Plan: [name] is a 78 y.o. year old female with PMH significant for known CAD (on Plavix 75 mg, taking every other day due to ASA intolerance), GERD, and HLD presenting with intermittent chest pain for 2-3 days with radiation to back. Admitting on 7/30.   1. Chest pain - Intermittent, present at time of initial interview/exam pt described active "on and off" sensation of pain lasting seconds at a time. Continues to deny severe pain or tearing sensation. Uncertain exertional component; pt did walk 30 minutes 7/29 without any of this pain, though pain described as happening when she is "up and about." No aspirin given in the ED due to pt intolerance. EKG unchanged from previous tracings. Pt's PCP contacted while pt in the ED, suggested admission for stress test as pt does not meet criteria for further work-up in the ED.  CTA of chest without evidence for dissection, but coronary atherosclerosis and mitral calcifications noted. CE panel negative x2, one  pending. TSH normal, A1c pending. No nitro drip or heparin overnight. Cardiology consulted 7/31.  PLAN - Appreciate cardiology recomendations/assistance. Plan is for stress test tomorrow. Holding beta blocker. F/u labs. Will continue home meds and/or adjust as appropriate pending further work-up.  2. CAD/PVD - Pt reports previous cath with "about a 50% blockage in one artery" in 2005. Unable to find record in EMR, though note from [facility]  nuclear stress test does mention cath in 2005 without any further details; that stress test was read as normal by Dr. Deborah Chalk. Pt has had carotid US with some plaques; pt unable to tolerate aspirin so is on Plavix 75 mg, but only taking every other day due to heavy bruising when taking daily, previously. On Toprol XL 25 mg, but states she takes 1 half tab at home.  PLAN - Continue home Plavix, currently holding beta blocker, as above. May need to address taking half an extended-release tablet at discharge/as outpt.   3. GERD - Per pt history on Protonix 40 mg daily at home. Symptoms on and off, as above; pt does describe at least two episodes of chest pain such as above relieved by belching. Some relief with Zantac at least once in the past couple of days, as well.  PLAN - Continue Protonix 40 mg daily. GI cocktail q6h PRN. May increase protonix.  4. HLD - On Crestor 10 mg at home. Last lipid panel in June 2013 shows TG 106, HDL 53, LDL 88. Continue med therapy, Lipitor 10 mg here, per formulary.   FEN/GI: NS at 75 mL. Protonix.  Prophylaxis: SCDs Other PRN: Tylenol (pain), Ativan qHS (sleep; home med)  Disposition: home pending work-up/improvement  Code Status: Full, but pt does not wish to be in a persistent vegetative state if providers feel she will need long-term life-support  Kendon Turner, Dickens, MD 07/30/2012, 9:49 AM

## 2012-07-30 NOTE — Discharge Summary (Signed)
Family Medicine Teaching [facility] Discharge Summary  Patient name: [name] Medical record number: [id number] Date of birth: [birth year] Age: 78 y.o. Gender: female Date of Admission: 07/29/2012  Date of Discharge: 07/31/2012 Admitting Physician: Sanjuana Letters, MD Discharging Physician: Payton Mccallum, MD  Primary Care Provider: Lucilla Edin, MD  Indication for Hospitalization: chest pain  Discharge Diagnoses:  1. Chest pain, uncertain etiology. 2. GERD  Consultations: [facility]  Significant Labs and Imaging:  Three cycled sets of cardiac enzymes were negative. CXR showed no acute findings. TSH normal Hgb A1c 6.3  CTA Chest:   1. Although there is mild ectasia of the ascending thoracic aorta  (4.0 x 4.2 cm in diameter), there is no evidence to suggest acute  aortic syndrome on today's examination.  2. However, there is atherosclerosis, including left main and two-  vessel coronary artery disease.  3. Extensive post infectious scarring in the right middle lobe and  to a lesser extent in the right upper lobe, as detailed above.  4. There are calcifications of the mitral annulus.  Echocardiographic correlation for evaluation of potential valvular  dysfunction may be warranted if clinically indicated.  5. 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  Procedures: Myocardial Imaging Stress Test: No evidence for pharmacologically induced ischemia. Normal wall motion and normal ejection fraction.  Brief Hospital Course: [name] is a 78 y.o. year old female with PMH significant for known CAD (on Plavix 75 mg, taking every other day due to ASA intolerance), GERD, and HLD presenting with intermittent chest pain for 2-3 days with radiation to back. Admitted on 7/30.   1. Chest pain - Intermittent, present at time of initial interview/exam pt described active "on and off" sensation of pain lasting  seconds at a time. Pt did not have any severe pain or tearing sensation. Uncertain exertional component; pt did walk 30 minutes on 7/29 without any of this pain, though pain described as happening when she is "up and about." No aspirin given in the ED due to pt intolerance. EKG was found to be unchanged from previous tracings. Dr. Cleta Alberts contacted while pt in the ED by the ED physician, and suggested admission for stress test as pt did not meet criteria for further work-up in the ED. CTA of chest in the ED had no evidence for dissection, but coronary atherosclerosis and mitral calcifications were noted. Pt underwent stress testing on 8/1, which showed no abnormalities. Cardiology consultant Dr. Peter Swaziland deemed patient safe for discharge after negative stress test.  2. CAD/PVD - Was continued on Plavix 75 mg every other day (her home dosing). Beta blocker was initially held as the admitting team was unsure if pt would undergo stress testing on 7/31 (morning after admission). Pt did report taking metoprolol XL 25 mg, but only one half tablet per day. Pt was instructed to continue her home medications at discharge. May need to address taking half an extended-release tablet at discharge/as outpt.  3. GERD - Pt reported being on Protonix 40 mg daily at home. Symptoms on and off, as above; pt did describe at least two episodes of chest pain prior to admission such as above relieved by belching, with some relief with Zantac at least once in the past couple of days, as well. Pt was instructed at discharge to take her regular home dose of Protonix.  4. HLD - Pt reported being on Crestor 10 mg at home. Last lipid panel in  June 2013 shows TG 106, HDL 53, LDL 88. Continued med therapy, Lipitor 10 mg here, per formulary. Instructed at discharge to continue her home Crestor dose.   Discharge Medications:  Medication List  As of 07/31/2012  4:11 PM   TAKE these medications         acetaminophen 500 MG tablet    Commonly known as: TYLENOL   Take 500 mg by mouth every 6 (six) hours as needed. For pain      CALCIUM + D PO   Take 1 tablet by mouth every morning.      cholecalciferol 1000 UNITS tablet   Commonly known as: VITAMIN D   Take 1,000 Units by mouth daily.      clobetasol cream 0.05 %   Commonly known as: TEMOVATE   Apply 1 application topically 2 (two) times daily as needed. eczema      clopidogrel 75 MG tablet   Commonly known as: PLAVIX   Take 75 mg by mouth every other day.      dextran 70-hypromellose ophthalmic solution   Place 2 drops into both eyes 3 (three) times daily as needed. Dry eyes      hydrocortisone 2.5 % rectal cream   Commonly known as: ANUSOL-HC   Place 1 application rectally 2 (two) times daily as needed. hemorrhoids      LORazepam 0.5 MG tablet   Commonly known as: ATIVAN   Take 0.5 mg by mouth at bedtime as needed. For sleep      metoprolol succinate 25 MG 24 hr tablet   Commonly known as: TOPROL-XL   Take 12.5 mg by mouth daily. Pt takes half a pill daily      MULTIVITAMIN PO   Take 1 tablet by mouth daily.      pantoprazole 40 MG tablet   Commonly known as: PROTONIX   Take 40 mg by mouth daily.      rosuvastatin 10 MG tablet   Commonly known as: CRESTOR   Take 10 mg by mouth at bedtime.           Issues for Follow Up:  1. Please address any further chest pain. Work-up appears to indicate that this chest pain was not cardiac in nature. 2. Pt reports taking half of one metoprolol XL 25 mg tablet per day; address if she should be cutting extended-release tablets in half or if med needs adjusting. 3. Cardiology (Dr. Swaziland with [facility]) recommended possibly increasing Protonix (prior to this admission, pt has been taking 40 mg per day). Patient was discharged on home dose of Protonix 40mg . 4. A1c elevated at 6.3 - may want to address this at follow up.  Outstanding Results: none  Discharge Instructions: Please refer to Patient  Instructions section of EMR for full details.  Patient was counseled important signs and symptoms that should prompt return to medical care, changes in medications, dietary instructions, activity restrictions, and follow up appointments.  Significant instructions noted below:  Follow-up Information    Follow up with Peter Swaziland, MD. Schedule an appointment as soon as possible for a visit in 1 month.   Contact information:   [address], [address] [place] [address] [phone]       Follow up with [facility]. Schedule an appointment as soon as possible for a visit in 1 week.   Contact information:   [address] [place] [address]  Discharge Condition: stable Discharged to: home  Levert Feinstein, MD 07/31/2012, 4:11 PM

## 2012-07-31 ENCOUNTER — Inpatient Hospital Stay (HOSPITAL_COMMUNITY): Payer: Medicare Other

## 2012-07-31 DIAGNOSIS — R079 Chest pain, unspecified: Secondary | ICD-10-CM

## 2012-07-31 MED ORDER — ACETAMINOPHEN 325 MG PO TABS
ORAL_TABLET | ORAL | Status: AC
  Filled 2012-07-31: qty 2

## 2012-07-31 MED ORDER — REGADENOSON 0.4 MG/5ML IV SOLN
INTRAVENOUS | Status: AC | PRN
  Administered 2012-07-31: 0.4 mg via INTRAVENOUS

## 2012-07-31 MED ORDER — TECHNETIUM TC 99M TETROFOSMIN IV KIT
30.0000 | PACK | Freq: Once | INTRAVENOUS | Status: AC | PRN
  Administered 2012-07-31: 30 via INTRAVENOUS

## 2012-07-31 MED ORDER — TECHNETIUM TC 99M TETROFOSMIN IV KIT
10.0000 | PACK | Freq: Once | INTRAVENOUS | Status: AC | PRN
  Administered 2012-07-31: 10 via INTRAVENOUS

## 2012-07-31 NOTE — Progress Notes (Signed)
Patient evaluated for long-term disease management services with [facility] as a benefit of her [employer]. Mrs Causey will receive a post discharge transition of care call and will be evaluated for possible monthly home visits for assessments and for education if needed. Spoke with patient prior to discharge to explain [facility] Care Management services and to inform her that she will be called after discharge. Left [facility] Care Management and contact information with patient. She reports she lives with husband and has no needs. Appreciative of the visit and pleased to be discharging home today.  Hyman Hopes- Ed,RN,BSN [facility] Care Management [facility] Liaison [phone]

## 2012-07-31 NOTE — Progress Notes (Signed)
Subjective: Sitting up, doing well, no complaints.  No chest pain s/p Myoview or any other time today.    Objective: Vital signs in last 24 hours: Temp:  [97.6 F (36.4 C)-98 F (36.7 C)] 97.8 F (36.6 C) (08/01 1243) Pulse Rate:  [59-100] 77  (08/01 1243) Resp:  [18-23] 23  (08/01 1243) BP: (98-148)/(38-62) 114/47 mmHg (08/01 1243) SpO2:  [93 %-97 %] 95 % (08/01 1243) Weight change:  Last BM Date: 07/30/12 (Pt states small BM today)  Intake/Output from previous day: 07/31 0701 - 08/01 0700 In: 1800 [I.V.:1800] Out: -  Intake/Output this shift: Total I/O In: 75 [I.V.:75] Out: 450 [Urine:450]  Gen:  Alert, cooperative patient who appears stated age in no acute distress.  Vital signs reviewed. Cardiac:  Regular rate and rhythm without murmur auscultated.  Good S1/S2. Pulm:  Clear to auscultation bilaterally with good air movement.  No wheezes or rales noted.   Abd:  Soft/nondistended/nontender.  Good bowel sounds throughout all four quadrants.  No masses noted.  Ext:  No clubbing/cyanosis/erythema.  No edema noted bilateral lower extremities.     Lab Results:  Basename 07/30/12 0520 07/29/12 1500  WBC 5.1 5.1  HGB 12.8 14.2  HCT 37.7 42.3  PLT 172 215   BMET  Basename 07/30/12 0520 07/29/12 1500  NA 142 141  K 3.5 3.7  CL 106 103  CO2 27 29  GLUCOSE 103* 104*  BUN 9 11  CREATININE 0.59 0.59  CALCIUM 8.8 10.0    Studies/Results: Gna Rad Results  07/31/2012  Ordered by an unspecified provider.   Ct Angio Chest Aortic Dissect W &/or W/o  07/29/2012  *RADIOLOGY REPORT*  Clinical Data: History of chest pain with radiation to the back.  RADIOLOGY EXAMINATION  Comparison: No priors.  Findings:  Mediastinum: There is mild ectasia of the ascending thoracic aorta which measures up to 4.0 x 4.2 cm in diameter.  No evidence of a thoracic aortic dissection at this time.  Precontrast images demonstrate no definite crescentic areas of high attenuation associated with the wall  of the thoracic aorta to suggest the presence of acute intramural hemorrhage. Heart size is borderline enlarged. There is no significant pericardial fluid, thickening or pericardial calcification.  There is atherosclerosis of the thoracic aorta, the great vessels of the mediastinum and the coronary arteries, including calcified atherosclerotic plaque in the left main, left anterior descending and left circumflex coronary arteries. Extensive calcifications of the mitral annulus. 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 There is a small hiatal hernia. No pathologically enlarged mediastinal or hilar and lymph nodes. Although the study was not specifically tailored for evaluation of pulmonary embolism, there is no filling defect identified within the pulmonary arterial tree to the level of the segmental pulmonary artery branches to suggest the presence of clinically relevant pulmonary embolism.  Lungs/Pleura: There is extensive cylindrical and varicose bronchiectasis in the right middle lobe, with severe architectural distortion and probable chronic collapse of the majority of the right middle lobe, likely related to remote infection.  Mild cylindrical bronchiectasis is also noted in the medial aspect of the right upper lobe to a much lesser extent.  No definite suspicious appearing pulmonary nodules or masses are identified. No acute consolidative airspace disease.  No pleural effusions. Minimal dependent atelectasis is noted in the lower lobes of the lungs bilaterally.  Upper Abdomen: Unremarkable.  Musculoskeletal: There are no aggressive appearing lytic or  blastic lesions noted in the visualized portions of the skeleton.  IMPRESSION: 1.  Although there is mild ectasia of the ascending thoracic aorta (4.0 x 4.2 cm in diameter), there is no evidence to suggest acute aortic syndrome on today's examination. 2.   However, there is atherosclerosis, including left main and two- vessel coronary artery disease. 3.  Extensive post infectious scarring in the right middle lobe and to a lesser extent in the right upper lobe, as detailed above. 4. There are calcifications of the mitral annulus. Echocardiographic correlation for evaluation of potential valvular dysfunction may be warranted if clinically indicated. 5.  Persistent left superior vena cava up (normal anatomical variant) incidentally noted.  Original Report Authenticated By: Florencia Reasons, M.D.    Medications: I have reviewed the patient's current medications.  Assessment/Plan: 1.  Chest pain:  Resolved.  Seems more likely GI in origin.  Awaiting results of Myoview.   2.  CAD:  Plavix on every other day dosing as at home.   3.  GERD:  Continuing home PPI 4.  HTN:  Relatively controlled in house.  Please see resident note for other chronic disease management.    Appreciate Cardiology consult.  If Myoview negative plan to DC home today.    LOS: 2 days   Sheril Hammond,JEFF 07/31/2012, 3:11 PM

## 2012-07-31 NOTE — ED Notes (Signed)
Start of NM cardiac stress test with [facility]

## 2012-07-31 NOTE — ED Notes (Signed)
Pt denies adverse effects of Lexiscan

## 2012-07-31 NOTE — Progress Notes (Signed)
Patient ID: [name], female   DOB: [birth year], 78 y.o.   MRN: [id number] Family Medicine Teaching Service Daily Progress Note Intern Pager: [phone]  Patient name: [name] Medical record number: [id number] Date of birth: 11/03/1926 Age: 78 y.o. Gender: female  Primary Care Provider: Lucilla Edin, MD  Subjective: Pt states she feels well. Has had no more chest pain. Had cardiac stress test this morning, and was hungry, so is eating lunch in bed.   Objective: Temp:  [97.6 F (36.4 C)-98 F (36.7 C)] 97.8 F (36.6 C) (08/01 1243) Pulse Rate:  [59-100] 77  (08/01 1243) Resp:  [18-23] 23  (08/01 1243) BP: (98-148)/(38-62) 114/47 mmHg (08/01 1243) SpO2:  [93 %-97 %] 95 % (08/01 1243) Exam: General: elderly female sitting up on side of bed eating lunch, in NAD, very pleasant and cooperative with interview/exam Cardiovascular: RRR, no rub/murmur appreciated Respiratory: lungs CTAB Abdomen: soft, nontender Extremities: lower extremities nontender, nonedematous  Laboratory:  Lab 07/30/12 0520 07/29/12 1500  WBC 5.1 5.1  HGB 12.8 14.2  HCT 37.7 42.3  PLT 172 215    Lab 07/30/12 0520 07/29/12 1500  NA 142 141  K 3.5 3.7  CL 106 103  CO2 27 29  BUN 9 11  CREATININE 0.59 0.59  CALCIUM 8.8 10.0  PROT -- --  BILITOT -- --  ALKPHOS -- --  ALT -- --  AST -- --  GLUCOSE 103* 104*    Lab 07/30/12 1241 07/30/12 0520 07/29/12 2122  CKTOTAL 55 45 48  TROPONINI <0.30 <0.30 <0.30  TROPONINT -- -- --  CKMBINDEX -- -- --   TSH, 7/30: 3.334 A1c: 6.3  Assessment and Plan: [name] is a 78 y.o. year old female with PMH significant for known CAD (on Plavix 75 mg, taking every other day due to ASA intolerance), GERD, and HLD presenting with intermittent chest pain for 2-3 days with radiation to back. Admission on 7/30.   1. Chest pain - Pt denies any current chest pain. Enzymes negative x 3. Hgb A1C 6.3. Cardiology on board, stress test completed this  morning. Spoke with interpreting radiologist who says nothing concerning seen on stress test. Cardiac rule out has been completed, pt is safe for d/c per cardiology's note. Will restart beta blocker upon discharge since stress test has been completed.   2. CAD/PVD - Continue home Plavix. May need to address taking half an extended-release tablet at discharge/as outpt.   3. GERD - Continue Protonix 40 mg daily. GI cocktail q6h PRN.  4. HLD - On Crestor 10 mg at home. Continue med therapy, Lipitor 10 mg here, per formulary.   5. FEN/GI: NS at 75 mL. Protonix.   6. Prophylaxis: SCDs  7. Other PRN: Tylenol (pain), Ativan qHS (sleep; home med)   8. Disposition: can d/c today.  9. Code Status: Full, but pt does not wish to be in a persistent vegetative state if providers feel she will need long-term life-support  Levert Feinstein, MD 07/31/2012, 3:22 PM

## 2012-07-31 NOTE — Progress Notes (Signed)
TELEMETRY: Reviewed telemetry pt in NSR: Filed Vitals:   07/30/12 2035 07/30/12 2150 07/30/12 2339 07/31/12 0523  BP: 99/38 98/49 108/45 120/48  Pulse: 59 66 62 64  Temp: 97.6 F (36.4 C)  97.8 F (36.6 C) 98 F (36.7 C)  TempSrc: Oral  Oral Oral  Resp: 18 21 18 21   Height:      Weight:      SpO2: 97%  93% 95%    Intake/Output Summary (Last 24 hours) at 07/31/12 0726 Last data filed at 07/31/12 0600  Gross per 24 hour  Intake   1650 ml  Output      0 ml  Net   1650 ml    SUBJECTIVE Feels well today. No further chest pain. Slept well.   LABS: Basic Metabolic Panel:  Basename 07/30/12 0520 07/29/12 1500  NA 142 141  K 3.5 3.7  CL 106 103  CO2 27 29  GLUCOSE 103* 104*  BUN 9 11  CREATININE 0.59 0.59  CALCIUM 8.8 10.0  MG -- --  PHOS -- --   CBC:  Basename 07/30/12 0520 07/29/12 1500  WBC 5.1 5.1  NEUTROABS -- 3.0  HGB 12.8 14.2  HCT 37.7 42.3  MCV 92.0 91.8  PLT 172 215   Cardiac Enzymes:  Basename 07/30/12 1241 07/30/12 0520 07/29/12 2122  CKTOTAL 55 45 48  CKMB 2.7 2.3 2.3  CKMBINDEX -- -- --  TROPONINI <0.30 <0.30 <0.30   Hemoglobin A1C:  Basename 07/29/12 2122  HGBA1C 6.3*   Thyroid Function Tests:  Basename 07/29/12 2122  TSH 3.334  T4TOTAL --  T3FREE --  THYROIDAB --    Radiology/Studies:  Dg Chest 2 View  07/29/2012  *RADIOLOGY REPORT*  Clinical Data: Chest and back pain.  CHEST - 2 VIEW  Comparison: 06/03/2012  Findings: Two views of the chest were obtained.  Stable appearance of the heart and mediastinum.  Lungs are clear without airspace disease or edema.  There may be scarring in the right hilum which is unchanged.  Trachea is midline.  Bony structures are intact.  IMPRESSION: No acute findings.  Original Report Authenticated By: Richarda Overlie, M.D.   Ct Angio Chest Aortic Dissect W &/or W/o  07/29/2012  *RADIOLOGY REPORT*  Clinical Data: History of chest pain with radiation to the back.  RADIOLOGY EXAMINATION  Comparison: No  priors.  Findings:  Mediastinum: There is mild ectasia of the ascending thoracic aorta which measures up to 4.0 x 4.2 cm in diameter.  No evidence of a thoracic aortic dissection at this time.  Precontrast images demonstrate no definite crescentic areas of high attenuation associated with the wall of the thoracic aorta to suggest the presence of acute intramural hemorrhage. Heart size is borderline enlarged. There is no significant pericardial fluid, thickening or pericardial calcification.  There is atherosclerosis of the thoracic aorta, the great vessels of the mediastinum and the coronary arteries, including calcified atherosclerotic plaque in the left main, left anterior descending and left circumflex coronary arteries. Extensive calcifications of the mitral annulus. 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 There is a small hiatal hernia. No pathologically enlarged mediastinal or hilar and lymph nodes. Although the study was not specifically tailored for evaluation of pulmonary embolism, there is no filling defect identified within the pulmonary arterial tree to the level of the segmental pulmonary artery branches to suggest the presence of clinically relevant pulmonary embolism.  Lungs/Pleura: There  is extensive cylindrical and varicose bronchiectasis in the right middle lobe, with severe architectural distortion and probable chronic collapse of the majority of the right middle lobe, likely related to remote infection.  Mild cylindrical bronchiectasis is also noted in the medial aspect of the right upper lobe to a much lesser extent.  No definite suspicious appearing pulmonary nodules or masses are identified. No acute consolidative airspace disease.  No pleural effusions. Minimal dependent atelectasis is noted in the lower lobes of the lungs bilaterally.  Upper Abdomen: Unremarkable.  Musculoskeletal:  There are no aggressive appearing lytic or blastic lesions noted in the visualized portions of the skeleton.  IMPRESSION: 1.  Although there is mild ectasia of the ascending thoracic aorta (4.0 x 4.2 cm in diameter), there is no evidence to suggest acute aortic syndrome on today's examination. 2.  However, there is atherosclerosis, including left main and two- vessel coronary artery disease. 3.  Extensive post infectious scarring in the right middle lobe and to a lesser extent in the right upper lobe, as detailed above. 4. There are calcifications of the mitral annulus. Echocardiographic correlation for evaluation of potential valvular dysfunction may be warranted if clinically indicated. 5.  Persistent left superior vena cava up (normal anatomical variant) incidentally noted.  Original Report Authenticated By: Florencia Reasons, M.D.    PHYSICAL EXAM General: Well developed, well nourished, in no acute distress. Head: Normocephalic, atraumatic, sclera non-icteric, no xanthomas, nares are without discharge. Neck: Negative for carotid bruits. JVD not elevated. Lungs: Clear bilaterally to auscultation without wheezes, rales, or rhonchi. Breathing is unlabored. Heart: RRR S1 S2 without murmurs, rubs, or gallops.  Abdomen: Soft, non-tender, non-distended with normoactive bowel sounds. No hepatomegaly. No rebound/guarding. No obvious abdominal masses. Msk:  Strength and tone appears normal for age. Extremities: No clubbing, cyanosis or edema.  Distal pedal pulses are 2+ and equal bilaterally. Neuro: Alert and oriented X 3. Moves all extremities spontaneously. Psych:  Responds to questions appropriately with a normal affect.  ASSESSMENT AND PLAN: 1. Chest pain. History of nonobstructive CAD by cath 2005. Coronary calcification by CT. Plan Myoview study today. If normal can discharge from cardiac standpoint.    Active Problems:  Hypercholesteremia  GERD (gastroesophageal reflux disease)  CAD  (coronary artery disease)  Chest pain    Signed, Yancy Knoble [place] MD,FACC 07/31/2012 7:29 AM

## 2012-07-31 NOTE — ED Notes (Signed)
Complain of mild headache

## 2012-07-31 NOTE — ED Notes (Signed)
NM Cardiac Stress Test completed

## 2012-07-31 NOTE — Progress Notes (Signed)
FMTS Attending Daily Note:  Renold Don MD  [phone] pager  Family Practice pager:  [phone] I have seen and examined this patient and have reviewed their chart. I have discussed this patient with the resident. I agree with the resident's findings, assessment and care plan.  Please see my note as well for today

## 2012-07-31 NOTE — Progress Notes (Signed)
Discharge instructions given. No changes noted from pt's home med.  IV access d/c'd.  Pt is for discharge to home in stable condition.

## 2012-07-31 NOTE — Progress Notes (Signed)
Lexiscan MV completed. Rhonda Barrett  07/31/2012 10:00 AM

## 2012-07-31 NOTE — ED Notes (Signed)
Pt to NM for Cardiac Stress Test with Lexiscan. Consent obtained for exam per R.Barrett, PA who remained at bedside for start and throughout exam.

## 2012-08-01 NOTE — Discharge Summary (Signed)
Family Medicine Teaching Service  Discharge Note : Attending Renold Don MD Pager [phone] Inpatient Team Pager:  [phone]  I have seen and examined this patient, reviewed their chart and discussed discharge planning wit the resident at the time of discharge. I agree with the discharge plan as above.  DC Physical and Exam as below: Objective:  Temp: [97.6 F (36.4 C)-98 F (36.7 C)] 97.8 F (36.6 C) (08/01 1243)  Pulse Rate: [59-100] 77 (08/01 1243)  Resp: [18-23] 23 (08/01 1243)  BP: (98-148)/(38-62) 114/47 mmHg (08/01 1243)  SpO2: [93 %-97 %] 95 % (08/01 1243)  Exam:  General: elderly female sitting up on side of bed eating lunch, in NAD, very pleasant and cooperative with interview/exam  Cardiovascular: RRR, no rub/murmur appreciated  Respiratory: lungs CTAB  Abdomen: soft, nontender  Extremities: lower extremities nontender, nonedematous  Tobey Grim, MD

## 2012-08-07 ENCOUNTER — Ambulatory Visit (INDEPENDENT_AMBULATORY_CARE_PROVIDER_SITE_OTHER): Payer: Medicare Other | Admitting: Emergency Medicine

## 2012-08-07 VITALS — BP 127/61 | HR 65 | Temp 97.3°F | Resp 16 | Ht 61.0 in | Wt 127.0 lb

## 2012-08-07 DIAGNOSIS — R52 Pain, unspecified: Secondary | ICD-10-CM

## 2012-08-07 DIAGNOSIS — R1013 Epigastric pain: Secondary | ICD-10-CM

## 2012-08-07 MED ORDER — RANITIDINE HCL 150 MG PO TABS: 150.0000 mg | ORAL_TABLET | Freq: Two times a day (BID) | ORAL | Status: DC

## 2012-08-07 MED ORDER — ESOMEPRAZOLE MAGNESIUM 40 MG PO CPDR: 40.0000 mg | DELAYED_RELEASE_CAPSULE | Freq: Every day | ORAL | Status: DC

## 2012-08-07 NOTE — Progress Notes (Signed)
  Subjective:    Patient ID: [name], female    DOB: December 01, 1926, 78 y.o.   MRN: [id number]  HPI patient presents for followup after recent hospitalization with chest pain. She had cardiac enzymes as well as a CT of the chest and stress test. She's in today stating she thinks her symptoms are secondary to reflux. She continues on tonics but it does not help. She's continues to have episodes of severe chest discomfort.    Review of Systems     Objective:   Physical Exam her neck was supple chest was clear heart regular rate no murmurs her abdomen is soft liver and spleen not enlarged there is no right upper quadrant tenderness extremities are without edema.        Assessment & Plan:  I suspect her chest discomfort is coming from a GI source since her cardiac testing was normal. We'll schedule an ultrasound abdomen to rule out gallbladder disease since it is been a number of years since she has had this evaluated. Had changed her from Protonix to Nexium and add Zantac 150 twice a day.

## 2012-08-08 ENCOUNTER — Other Ambulatory Visit: Payer: Self-pay | Admitting: Radiology

## 2012-08-08 MED ORDER — PANTOPRAZOLE SODIUM 40 MG PO TBEC: 40.0000 mg | DELAYED_RELEASE_TABLET | Freq: Two times a day (BID) | ORAL | Status: DC

## 2012-08-11 ENCOUNTER — Other Ambulatory Visit: Payer: Self-pay | Admitting: Family Medicine

## 2012-08-11 ENCOUNTER — Other Ambulatory Visit: Payer: Self-pay | Admitting: Radiology

## 2012-08-11 MED ORDER — LORAZEPAM 0.5 MG PO TABS: 0.5000 mg | ORAL_TABLET | Freq: Every evening | ORAL | Status: DC | PRN

## 2012-08-11 MED ORDER — METOPROLOL SUCCINATE ER 25 MG PO TB24: 12.5000 mg | ORAL_TABLET | Freq: Every day | ORAL | Status: DC

## 2012-08-14 ENCOUNTER — Ambulatory Visit
Admission: RE | Admit: 2012-08-14 | Discharge: 2012-08-14 | Disposition: A | Discharge status: Home / Self Care | Payer: Medicare Other | Source: Ambulatory Visit | Attending: Emergency Medicine | Admitting: Emergency Medicine

## 2012-08-14 DIAGNOSIS — R1013 Epigastric pain: Secondary | ICD-10-CM

## 2012-08-22 ENCOUNTER — Encounter: Payer: Self-pay | Admitting: Emergency Medicine

## 2012-08-31 ENCOUNTER — Ambulatory Visit (INDEPENDENT_AMBULATORY_CARE_PROVIDER_SITE_OTHER): Payer: Medicare Other | Admitting: Emergency Medicine

## 2012-08-31 VITALS — BP 149/61 | HR 87 | Temp 98.3°F | Resp 16 | Ht 61.58 in | Wt 127.4 lb

## 2012-08-31 DIAGNOSIS — R350 Frequency of micturition: Secondary | ICD-10-CM

## 2012-08-31 DIAGNOSIS — N390 Urinary tract infection, site not specified: Secondary | ICD-10-CM

## 2012-08-31 LAB — POCT URINALYSIS DIPSTICK
Protein, UA: 30
Spec Grav, UA: >=1.03
Urobilinogen, UA: 0.2
pH, UA: 5.5

## 2012-08-31 LAB — POCT UA - MICROSCOPIC ONLY
Casts, Ur, LPF, POC: NEGATIVE
Crystals, Ur, HPF, POC: NEGATIVE
Epithelial cells, urine per micros: NEGATIVE
Mucus, UA: NEGATIVE

## 2012-08-31 MED ORDER — CIPROFLOXACIN HCL 250 MG PO TABS: 250.0000 mg | ORAL_TABLET | Freq: Two times a day (BID) | ORAL | Status: DC

## 2012-08-31 NOTE — Patient Instructions (Signed)

## 2012-08-31 NOTE — Progress Notes (Signed)
  Subjective:    Patient ID: [name], female    DOB: Oct 26, 1926, 78 y.o.   MRN: [id number]  HPI    Review of Systems     Objective:   Physical Exam        Results for orders placed in visit on 08/31/12  POCT UA - MICROSCOPIC ONLY      Component Value Range   WBC, Ur, HPF, POC TNTC     RBC, urine, microscopic 0-2     Bacteria, U Microscopic 3+     Mucus, UA neg     Epithelial cells, urine per micros neg     Crystals, Ur, HPF, POC neg     Casts, Ur, LPF, POC neg     Yeast, UA neg    POCT URINALYSIS DIPSTICK      Component Value Range   Color, UA yellow     Clarity, UA cloudy     Glucose, UA neg     Bilirubin, UA neg     Ketones, UA trace     Spec Grav, UA >=1.030     Blood, UA moderate     pH, UA 5.5     Protein, UA 30     Urobilinogen, UA 0.2     Nitrite, UA neg     Leukocytes, UA small (1+)     Assessment & Plan:      We'll treat with Cipro 250 twice a day for one week. Urine culture was done . Referral made to Dr. Annabell Howells for his evaluation

## 2012-08-31 NOTE — Progress Notes (Signed)
  Subjective:    Patient ID: [name], female    DOB: September 14, 1926, 78 y.o.   MRN: [id number]  HPI patient here with a three-day history of burning stinging pain on urination. She was recently treated with a ten-day course of Cipro. She returns today with recurrent symptoms. She had urinary frequency through the night again this morning. She is seeing Dr. Wilson Singer in the past    Review of Systems     Objective:   Physical Exam there is no CVA tenderness. The abdomen is soft there is minimal left lower abdominal tenderness without rebound.  Results for orders placed in visit on 08/31/12  POCT UA - MICROSCOPIC ONLY      Component Value Range   WBC, Ur, HPF, POC TNTC     RBC, urine, microscopic 0-2     Bacteria, U Microscopic 3+     Mucus, UA neg     Epithelial cells, urine per micros neg     Crystals, Ur, HPF, POC neg     Casts, Ur, LPF, POC neg     Yeast, UA neg    POCT URINALYSIS DIPSTICK      Component Value Range   Color, UA yellow     Clarity, UA cloudy     Glucose, UA neg     Bilirubin, UA neg     Ketones, UA trace     Spec Grav, UA >=1.030     Blood, UA moderate     pH, UA 5.5     Protein, UA 30     Urobilinogen, UA 0.2     Nitrite, UA neg     Leukocytes, UA small (1+)          Assessment & Plan:  Patient with recurrent UTI's.Refferal to Dr. Annabell Howells. Treat with Cipro

## 2012-09-03 ENCOUNTER — Encounter: Payer: Self-pay | Admitting: Cardiology

## 2012-09-03 ENCOUNTER — Ambulatory Visit (INDEPENDENT_AMBULATORY_CARE_PROVIDER_SITE_OTHER): Payer: Medicare Other | Admitting: Cardiology

## 2012-09-03 VITALS — BP 130/60 | HR 84 | Ht 60.0 in | Wt 126.1 lb

## 2012-09-03 DIAGNOSIS — K219 Gastro-esophageal reflux disease without esophagitis: Secondary | ICD-10-CM

## 2012-09-03 DIAGNOSIS — I25810 Atherosclerosis of coronary artery bypass graft(s) without angina pectoris: Secondary | ICD-10-CM

## 2012-09-03 LAB — URINE CULTURE: Colony Count: >=100000

## 2012-09-03 NOTE — Progress Notes (Signed)
[name] Date of Birth: [birth year] Medical Record #[id number]  History of Present Illness: [name] is seen today for followup. [name] was hospitalized in July with symptoms of chest pain. CT of the chest showed mild aortic ectasia. [name] had some coronary calcification consistent with atherosclerosis. [name] had a Myoview study which was normal. [name] has a history of prior cardiac catheterization in 2005 which showed mild nonobstructive disease. [name] was treated for reflux with resolution of her chest pain symptoms. [name] remains on Plavix and statin therapy. [name] remains active but does walk with a cane. [name] denies any complaints today.  Current Outpatient Prescriptions on File Prior to Visit  Medication Sig Dispense Refill  . Calcium Carbonate-Vitamin D (CALCIUM + D PO) Take 1 tablet by mouth every morning.       . cholecalciferol (VITAMIN D) 1000 UNITS tablet Take 1,000 Units by mouth daily.      . clopidogrel (PLAVIX) 75 MG tablet Take 75 mg by mouth every other day.      Marland Kitchen LORazepam (ATIVAN) 0.5 MG tablet Take 1 tablet (0.5 mg total) by mouth at bedtime as needed. For sleep  30 tablet  0  . metoprolol succinate (TOPROL-XL) 25 MG 24 hr tablet Take 0.5 tablets (12.5 mg total) by mouth daily. Pt takes half a pill daily  30 tablet  0  . Multiple Vitamins-Minerals (MULTIVITAMIN PO) Take 1 tablet by mouth daily.       . pantoprazole (PROTONIX) 40 MG tablet Take 40 mg by mouth daily.      . ranitidine (ZANTAC) 150 MG tablet Take 1 tablet (150 mg total) by mouth 2 (two) times daily.  60 tablet  5  . rosuvastatin (CRESTOR) 10 MG tablet Take 10 mg by mouth at bedtime.        Allergies  Allergen Reactions  . Aspirin Other (See Comments)    Feels like choking  . Bactrim   . Niacin And Related   . Penicillins   . Relafen (Nabumetone)   . Tetracyclines & Related     Past Medical History  Diagnosis Date  . CAD (coronary artery disease)     a. Nonobstructive by last cath in 05/2004 with  30% LAD. b. EF 65-70% 12/2010. c. Hx ASA intolerance.   . Hypercholesteremia     Hx of myalgias felt to be statin related but taking Crestor as OP  . GERD (gastroesophageal reflux disease)     a. Minimal hiatal hernia on EGD 2006, no obvious esophageal source for CP at that time.  . Hypertension   . Osteoporosis   . Aortic dilatation     Seen on cath 2005  . Colitis     12/2010 associated with transient lower GI bleeding/anemia tx with abx    Past Surgical History  Procedure Date  . Back surgery     Disc disease  . Hysterectomy -- unknown type   . Shunt for syringomyelia 1987   . Temporal artery biopsy / ligation     Negative 2005  . Shoulder surgery     History  Smoking status  . Former Smoker  Smokeless tobacco  . Not on file    History  Alcohol Use No    Family History  Problem Relation Age of Onset  . Heart disease    . Heart failure      Review of Systems: As noted in history of present illness.  [name] has had recurrent urinary tract infections and is scheduled  to see urology. All other systems were reviewed and are negative.  Physical Exam: BP 130/60  Pulse 84  Ht 5' (1.524 m)  Wt 57.208 kg (126 lb 1.9 oz)  BMI 24.63 kg/m2  SpO2 98% [name] is a pleasant elderly white female in no acute distress. HEENT exam is unremarkable. [name] has no JVD, adenopathy, thyromegaly, or bruits. Lungs are clear. Cardiovascular regular rate and rhythm, normal S1 and S2. Gallops or murmur. Abdomen is soft and nontender without masses or bruits. Extremities are without clubbing or edema. Pedal pulses are good. Neurologic exam [name] is alert and oriented x3. Cranial nerves II through XII are intact. LABORATORY DATA:   Assessment / Plan: 1. Nonobstructive coronary disease. Patient is now asymptomatic. Recent Myoview study was normal. Continue risk factor modification. I will see her back as needed.  2. GERD. Improved reflux symptoms.  3. History of sinus bradycardia.  4. Aortic  ectasia.

## 2012-09-03 NOTE — Patient Instructions (Signed)
Continue your current medication  I will see you back as needed.

## 2012-10-14 ENCOUNTER — Other Ambulatory Visit: Payer: Self-pay | Admitting: Radiology

## 2012-10-14 ENCOUNTER — Ambulatory Visit (INDEPENDENT_AMBULATORY_CARE_PROVIDER_SITE_OTHER): Payer: Medicare Other

## 2012-10-14 DIAGNOSIS — Z23 Encounter for immunization: Secondary | ICD-10-CM

## 2012-10-14 MED ORDER — LORAZEPAM 0.5 MG PO TABS: 0.5000 mg | ORAL_TABLET | Freq: Every evening | ORAL | Status: DC | PRN

## 2012-10-14 MED ORDER — METOPROLOL SUCCINATE ER 25 MG PO TB24: 12.5000 mg | ORAL_TABLET | Freq: Every day | ORAL | Status: DC

## 2012-10-14 NOTE — Telephone Encounter (Signed)
I sent in for her Metoprolol, got fax request, she is also asking for renewal on Ativan 0.5mg , please advise, have pended Rx

## 2012-10-24 ENCOUNTER — Ambulatory Visit: Payer: Medicare Other

## 2012-10-24 ENCOUNTER — Ambulatory Visit (INDEPENDENT_AMBULATORY_CARE_PROVIDER_SITE_OTHER): Payer: Medicare Other | Admitting: Family Medicine

## 2012-10-24 VITALS — BP 128/64 | HR 76 | Temp 97.6°F | Resp 16 | Ht 61.5 in | Wt 126.0 lb

## 2012-10-24 DIAGNOSIS — M25569 Pain in unspecified knee: Secondary | ICD-10-CM

## 2012-10-24 DIAGNOSIS — W19XXXA Unspecified fall, initial encounter: Secondary | ICD-10-CM

## 2012-10-24 NOTE — Progress Notes (Signed)
[facility] [address] [place] [address] [phone]  Date:  10/24/2012   Name:  [name]   DOB:  09/18/1926   MRN:  [id number]  PCP:  Lucilla Edin, MD    Chief Complaint: Knee Pain   History of Present Illness:  [name] is a 78 y.o. very pleasant female patient who presents with the following:  Yesterday she was not using her cane- she tripped on an uneven spot outdoors and fell onto her left knee while carrying something out to the car.  Her knee is sore- she thinks it is ok, but her daughter (a PA) wanted her to have it looked at. She is otherwise ok except for a couple of minor abrasions.   She is able to walk using her cane, but she is sore and using tylenol.    Patient Active Problem List  Diagnosis  . Hypercholesteremia  . GERD (gastroesophageal reflux disease)  . CAD (coronary artery disease)  . Chest pain    Past Medical History  Diagnosis Date  . CAD (coronary artery disease)     a. Nonobstructive by last cath in 05/2004 with 30% LAD. b. EF 65-70% 12/2010. c. Hx ASA intolerance.   . Hypercholesteremia     Hx of myalgias felt to be statin related but taking Crestor as OP  . GERD (gastroesophageal reflux disease)     a. Minimal hiatal hernia on EGD 2006, no obvious esophageal source for CP at that time.  . Hypertension   . Osteoporosis   . Aortic dilatation     Seen on cath 2005  . Colitis     12/2010 associated with transient lower GI bleeding/anemia tx with abx  . Arthritis     Past Surgical History  Procedure Date  . Back surgery     Disc disease  . Hysterectomy -- unknown type   . Shunt for syringomyelia 1987   . Temporal artery biopsy / ligation     Negative 2005  . Shoulder surgery   . Abdominal hysterectomy     History  Substance Use Topics  . Smoking status: Former Games developer  . Smokeless tobacco: Not on file  . Alcohol Use: No    Family History  Problem Relation Age of Onset  . Heart  disease    . Heart failure      Allergies  Allergen Reactions  . Aspirin Other (See Comments)    Feels like choking  . Bactrim   . Niacin And Related   . Penicillins   . Relafen (Nabumetone)   . Tetracyclines & Related     Medication list has been reviewed and updated.  Current Outpatient Prescriptions on File Prior to Visit  Medication Sig Dispense Refill  . Calcium Carbonate-Vitamin D (CALCIUM + D PO) Take 1 tablet by mouth every morning.       . cholecalciferol (VITAMIN D) 1000 UNITS tablet Take 1,000 Units by mouth daily.      . clopidogrel (PLAVIX) 75 MG tablet Take 75 mg by mouth every other day.      Marland Kitchen LORazepam (ATIVAN) 0.5 MG tablet Take 1 tablet (0.5 mg total) by mouth at bedtime as needed. For sleep  30 tablet  0  . metoprolol succinate (TOPROL-XL) 25 MG 24 hr tablet Take 0.5 tablets (12.5 mg total) by mouth daily. Pt takes half a pill daily  30 tablet  1  . Multiple Vitamins-Minerals (MULTIVITAMIN PO) Take 1 tablet  by mouth daily.       . pantoprazole (PROTONIX) 40 MG tablet Take 40 mg by mouth daily.      . ranitidine (ZANTAC) 150 MG tablet Take 1 tablet (150 mg total) by mouth 2 (two) times daily.  60 tablet  5  . rosuvastatin (CRESTOR) 10 MG tablet Take 10 mg by mouth at bedtime.      . ciprofloxacin (CIPRO) 250 MG tablet         Review of Systems:  As per HPI- otherwise negative.   Physical Examination: Filed Vitals:   10/24/12 0951  BP: 128/64  Pulse: 76  Temp: 97.6 F (36.4 C)  Resp: 16   Filed Vitals:   10/24/12 0951  Height: 5' 1.5" (1.562 m)  Weight: 126 lb (57.153 kg)   Body mass index is 23.42 kg/(m^2). Ideal Body Weight: Weight in (lb) to have BMI = 25: 134.2    GEN: WDWN, NAD, Non-toxic, Alert & Oriented x 3.  Appears younger than age.   HEENT: Atraumatic, Normocephalic.  Ears and Nose: No external deformity. EXTR: No clubbing/cyanosis/edema NEURO: slightly antalgic, using her cane.   PSYCH: Normally interactive. Conversant. Not  depressed or anxious appearing.  Calm demeanor.  Left knee: she is tender and has a small abrasion over her left knee- over her superior tibia area.  Normal flexion and extension, and ligaments are stable.  No joint effusion  [facility] reading (PRIMARY) by  Dr. Patsy Lager. Negative knee film- mild OA but no fracture  LEFT KNEE - COMPLETE 4+ VIEW  Comparison: 09/06/2010  Findings: No acute fracture and no dislocation. Unremarkable soft tissues.  IMPRESSION: No acute bony pathology.   Assessment and Plan: 1. Knee pain  DG Knee Complete 4 Views Left  2. Fall     Contusion of knee.  Tetanus shot UTD- 2007.  She will rest the knee and treat as per pt instructions  Hadlee Burback, MD

## 2012-10-24 NOTE — Patient Instructions (Addendum)
Use ice and heat as needed.  Tylenol can help too. Please do give me a call if you are not better in the next few days.

## 2012-10-28 ENCOUNTER — Telehealth: Payer: Self-pay

## 2012-10-28 NOTE — Telephone Encounter (Signed)
Patient is still in pain from fall. Can't put weight on knee. Dr Patsy Lager asked to call back if not getting better.  Best [phone]

## 2012-10-28 NOTE — Telephone Encounter (Signed)
Please advise, Dr Patsy Lager out of office today.

## 2012-10-28 NOTE — Telephone Encounter (Signed)
Dr. Patsy Lager, please advise on [name]'s knee pain that is not resolving

## 2012-10-29 NOTE — Telephone Encounter (Signed)
Spoke with pt and she states that it does seem to be improving since this morning when she called, feels that she does not need to come in yet and she feels it needs to run its course.  Advised to come in if pain does not show any improvement pt understood. D/w [place] at front desk.

## 2012-10-29 NOTE — Telephone Encounter (Signed)
Please call and get details- if she is not any better we probably need to see her again and maybe give her a knee brace or have her see a specialist

## 2012-10-29 NOTE — Telephone Encounter (Signed)
LMOM to give [place] call back so we can have more details.

## 2012-11-09 ENCOUNTER — Ambulatory Visit (INDEPENDENT_AMBULATORY_CARE_PROVIDER_SITE_OTHER): Payer: Medicare Other | Admitting: Family Medicine

## 2012-11-09 VITALS — BP 120/78 | HR 89 | Temp 97.5°F | Resp 20 | Ht 61.5 in | Wt 126.8 lb

## 2012-11-09 DIAGNOSIS — R350 Frequency of micturition: Secondary | ICD-10-CM

## 2012-11-09 DIAGNOSIS — N390 Urinary tract infection, site not specified: Secondary | ICD-10-CM

## 2012-11-09 DIAGNOSIS — IMO0001 Reserved for inherently not codable concepts without codable children: Secondary | ICD-10-CM

## 2012-11-09 DIAGNOSIS — R6883 Chills (without fever): Secondary | ICD-10-CM

## 2012-11-09 LAB — POCT URINALYSIS DIPSTICK
Protein, UA: 30
Spec Grav, UA: >=1.03

## 2012-11-09 LAB — POCT UA - MICROSCOPIC ONLY
Crystals, Ur, HPF, POC: NEGATIVE
Epithelial cells, urine per micros: NEGATIVE

## 2012-11-09 MED ORDER — CIPROFLOXACIN HCL 500 MG PO TABS: 500.0000 mg | ORAL_TABLET | Freq: Two times a day (BID) | ORAL | Status: DC

## 2012-11-09 NOTE — Progress Notes (Signed)
[facility]:  Office Visit  Chief Complaint:  Chief Complaint  Patient presents with  . Urinary Tract Infection  . Urinary Frequency    HPI: [name] is a 78 y.o. female who complains of  1 day h/o urinary frequency and dysuria associated with chills. No fevers. Deneis odor. Waking up every 2 hours to urinate. + back pain. Has not tried anything for it. She has grown out E coli in the past, that was sensitive to cipro.  She sese Dr. Donnetta Hail a urologist for these recurrent UTIs. They were considering putting her on a prophylactic abx if she continues to have them  Past Medical History  Diagnosis Date  . CAD (coronary artery disease)     a. Nonobstructive by last cath in 05/2004 with 30% LAD. b. EF 65-70% 12/2010. c. Hx ASA intolerance.   . Hypercholesteremia     Hx of myalgias felt to be statin related but taking Crestor as OP  . GERD (gastroesophageal reflux disease)     a. Minimal hiatal hernia on EGD 2006, no obvious esophageal source for CP at that time.  . Hypertension   . Osteoporosis   . Aortic dilatation     Seen on cath 2005  . Colitis     12/2010 associated with transient lower GI bleeding/anemia tx with abx  . Arthritis    Past Surgical History  Procedure Date  . Back surgery     Disc disease  . Hysterectomy -- unknown type   . Shunt for syringomyelia 1987   . Temporal artery biopsy / ligation     Negative 2005  . Shoulder surgery   . Abdominal hysterectomy    History   Social History  . Marital Status: Married    Spouse Name: N/A    Number of Children: N/A  . Years of Education: N/A   Social History Main Topics  . Smoking status: Former Games developer  . Smokeless tobacco: None  . Alcohol Use: No  . Drug Use: No  . Sexually Active: None   Other Topics Concern  . None   Social History Narrative  . None   Family History  Problem Relation Age of Onset  . Heart disease    . Heart failure     Allergies  Allergen Reactions  .  Aspirin Other (See Comments)    Feels like choking  . Bactrim   . Niacin And Related   . Penicillins   . Relafen (Nabumetone)   . Tetracyclines & Related    Prior to Admission medications   Medication Sig Start Date End Date Taking? Authorizing Provider  Calcium Carbonate-Vitamin D (CALCIUM + D PO) Take 1 tablet by mouth every morning.    Yes Historical Provider, MD  cholecalciferol (VITAMIN D) 1000 UNITS tablet Take 1,000 Units by mouth daily.   Yes Historical Provider, MD  clopidogrel (PLAVIX) 75 MG tablet Take 75 mg by mouth every other day.   Yes Historical Provider, MD  LORazepam (ATIVAN) 0.5 MG tablet Take 1 tablet (0.5 mg total) by mouth at bedtime as needed. For sleep 10/14/12  Yes Heather M Marte, PA-C  metoprolol succinate (TOPROL-XL) 25 MG 24 hr tablet Take 0.5 tablets (12.5 mg total) by mouth daily. Pt takes half a pill daily 10/14/12  Yes Ryan M Dunn, PA-C  Multiple Vitamins-Minerals (MULTIVITAMIN PO) Take 1 tablet by mouth daily.    Yes Historical Provider, MD  pantoprazole (PROTONIX) 40 MG tablet Take 40 mg by  mouth daily. 08/08/12  Yes Collene Gobble, MD  ranitidine (ZANTAC) 150 MG tablet Take 1 tablet (150 mg total) by mouth 2 (two) times daily. 08/07/12 08/07/13 Yes Collene Gobble, MD  rosuvastatin (CRESTOR) 10 MG tablet Take 10 mg by mouth at bedtime.   Yes Historical Provider, MD  ciprofloxacin (CIPRO) 250 MG tablet  08/31/12   Historical Provider, MD     ROS: The patient denies fevers, chills, night sweats, unintentional weight loss, chest pain, palpitations, wheezing, dyspnea on exertion, nausea, vomiting, abdominal pain,  hematuria, melena, numbness, weakness, or tingling.   All other systems have been reviewed and were otherwise negative with the exception of those mentioned in the HPI and as above.    PHYSICAL EXAM: Filed Vitals:   11/09/12 1504  BP: 120/78  Pulse: 89  Temp: 97.5 F (36.4 C)  Resp: 20   Filed Vitals:   11/09/12 1504  Height: 5' 1.5" (1.562 m)    Weight: 126 lb 12.8 oz (57.516 kg)   Body mass index is 23.57 kg/(m^2).  General: Alert, no acute distress HEENT:  Normocephalic, atraumatic, oropharynx patent.  Cardiovascular:  Regular rate and rhythm, no rubs murmurs or gallops.  No Carotid bruits, radial pulse intact. No pedal edema.  Respiratory: Clear to auscultation bilaterally.  No wheezes, rales, or rhonchi.  No cyanosis, no use of accessory musculature GI: No organomegaly, abdomen is soft and non-tender, positive bowel sounds.  No masses. Skin: No rashes. Neurologic: Facial musculature symmetric. Psychiatric: Patient is appropriate throughout our interaction. Lymphatic: No cervical lymphadenopathy Musculoskeletal: Gait intact. + CVA   LABS: Results for orders placed in visit on 11/09/12  POCT UA - MICROSCOPIC ONLY      Component Value Range   WBC, Ur, HPF, POC 10-15     RBC, urine, microscopic 10-15     Bacteria, U Microscopic 2+     Mucus, UA neg     Epithelial cells, urine per micros neg     Crystals, Ur, HPF, POC neg     Casts, Ur, LPF, POC neg     Yeast, UA neg    POCT URINALYSIS DIPSTICK      Component Value Range   Color, UA yellow     Clarity, UA cloudy     Glucose, UA 100     Bilirubin, UA neg     Ketones, UA trace     Spec Grav, UA >=1.030     Blood, UA moderate     pH, UA 5.5     Protein, UA 30     Urobilinogen, UA 0.2     Nitrite, UA positive     Leukocytes, UA moderate (2+)       EKG/XRAY:   Primary read interpreted by Dr. Conley Rolls at [facility].   ASSESSMENT/PLAN: Encounter Diagnoses  Name Primary?  . Frequency Yes  . Chills   . UTI (lower urinary tract infection)    Will rx cipro 500 mg BID x 10 days, patient advised to see DR. Wrenn for f/u Urine cx pending May take Azo and push fluids prn She has multiple drug allergies but has been on cipro before. We discussed the  Risk and benfits of being on cipro at her age. She has age appropriate kidney fxn     LE, THAO PHUONG,  DO 11/10/2012 11:27 AM

## 2012-11-11 ENCOUNTER — Telehealth: Payer: Self-pay | Admitting: Family Medicine

## 2012-11-11 LAB — URINE CULTURE: Colony Count: >=100000

## 2012-11-11 NOTE — Telephone Encounter (Signed)
Spoke to her about labs. Faxed culture results to urologist. Bjorn Pippin MD [phone] ( phone) /[phone] ( fax)

## 2012-11-14 ENCOUNTER — Other Ambulatory Visit: Payer: Self-pay | Admitting: Physician Assistant

## 2012-12-10 ENCOUNTER — Other Ambulatory Visit: Payer: Self-pay | Admitting: Emergency Medicine

## 2012-12-12 ENCOUNTER — Ambulatory Visit: Payer: Medicare Other

## 2012-12-12 ENCOUNTER — Ambulatory Visit (INDEPENDENT_AMBULATORY_CARE_PROVIDER_SITE_OTHER): Payer: Medicare Other | Admitting: Emergency Medicine

## 2012-12-12 VITALS — BP 118/68 | HR 62 | Temp 97.8°F | Resp 18 | Ht 61.5 in | Wt 129.0 lb

## 2012-12-12 DIAGNOSIS — M79673 Pain in unspecified foot: Secondary | ICD-10-CM

## 2012-12-12 DIAGNOSIS — M79609 Pain in unspecified limb: Secondary | ICD-10-CM

## 2012-12-12 MED ORDER — HYDROCODONE-ACETAMINOPHEN 5-325 MG PO TABS: 1.0000 | ORAL_TABLET | Freq: Four times a day (QID) | ORAL | Status: DC | PRN

## 2012-12-12 NOTE — Patient Instructions (Signed)

## 2012-12-12 NOTE — Progress Notes (Signed)
  Subjective:    Patient ID: [name], female    DOB: 12/20/1926, 78 y.o.   MRN: [id number]  HPI both heels pain feels like walking on rocks started this Tuesday, does not hurt when you push on the foot just when walking. She spent a good deal walking [facility] she will good comfortable shoes. At shopping experience she developed severe neck pain in her feet he now she gets out of bed in the morning she has severe discomfort and difficulty walking.    Review of Systems     Objective:   Physical Exam there is mild tenderness over the plantar areas of both feet. There is no tender nodes noted over the heel itself. The foot exam is normal .  [facility] reading (PRIMARY) by  Dr. Cleta Alberts x-rays of both feet are normal        Assessment & Plan:    I suspect patient has plantar fasciitis. We'll give her an instruction sheet about this and medications for pain .

## 2012-12-15 ENCOUNTER — Other Ambulatory Visit: Payer: Self-pay | Admitting: Radiology

## 2012-12-15 NOTE — Telephone Encounter (Signed)
Have gotten a fax from [facility] please advise on renewal of Ativan 0.5mg  tablet pended Rx sign if you want her to have and I can call in for her.

## 2012-12-16 ENCOUNTER — Other Ambulatory Visit: Payer: Self-pay | Admitting: Radiology

## 2012-12-16 MED ORDER — LORAZEPAM 0.5 MG PO TABS: 0.5000 mg | ORAL_TABLET | Freq: Every evening | ORAL | Status: DC | PRN

## 2013-01-21 ENCOUNTER — Other Ambulatory Visit: Payer: Self-pay | Admitting: *Deleted

## 2013-01-21 MED ORDER — ROSUVASTATIN CALCIUM 10 MG PO TABS: 10.0000 mg | ORAL_TABLET | Freq: Every day | ORAL | Status: DC

## 2013-02-09 ENCOUNTER — Other Ambulatory Visit: Payer: Self-pay | Admitting: Physician Assistant

## 2013-02-09 ENCOUNTER — Other Ambulatory Visit: Payer: Self-pay | Admitting: Emergency Medicine

## 2013-03-04 ENCOUNTER — Other Ambulatory Visit: Payer: Self-pay | Admitting: Physician Assistant

## 2013-03-17 ENCOUNTER — Other Ambulatory Visit: Payer: Self-pay

## 2013-03-17 MED ORDER — PANTOPRAZOLE SODIUM 40 MG PO TBEC: 40.0000 mg | DELAYED_RELEASE_TABLET | Freq: Every day | ORAL | Status: DC

## 2013-03-19 ENCOUNTER — Other Ambulatory Visit: Payer: Self-pay | Admitting: Radiology

## 2013-03-19 ENCOUNTER — Telehealth: Payer: Self-pay | Admitting: Radiology

## 2013-03-19 MED ORDER — LORAZEPAM 0.5 MG PO TABS: 0.5000 mg | ORAL_TABLET | Freq: Every evening | ORAL | Status: DC | PRN

## 2013-03-19 NOTE — Telephone Encounter (Signed)
Please advise on Ativan renewal/ [facility] sent request.

## 2013-03-19 NOTE — Telephone Encounter (Signed)
Called in for her

## 2013-03-19 NOTE — Telephone Encounter (Signed)
Okay to refill Ativan with 2 additional refills

## 2013-04-06 ENCOUNTER — Other Ambulatory Visit: Payer: Self-pay | Admitting: Physician Assistant

## 2013-04-28 ENCOUNTER — Other Ambulatory Visit: Payer: Self-pay | Admitting: Physician Assistant

## 2013-05-19 ENCOUNTER — Encounter: Payer: Self-pay | Admitting: Emergency Medicine

## 2013-05-19 ENCOUNTER — Ambulatory Visit (INDEPENDENT_AMBULATORY_CARE_PROVIDER_SITE_OTHER): Payer: Medicare Other | Admitting: Emergency Medicine

## 2013-05-19 VITALS — BP 114/71 | HR 74 | Temp 98.0°F | Resp 16 | Ht 61.5 in | Wt 129.0 lb

## 2013-05-19 DIAGNOSIS — I2510 Atherosclerotic heart disease of native coronary artery without angina pectoris: Secondary | ICD-10-CM

## 2013-05-19 DIAGNOSIS — R079 Chest pain, unspecified: Secondary | ICD-10-CM

## 2013-05-19 DIAGNOSIS — Z0000 Encounter for general adult medical examination without abnormal findings: Secondary | ICD-10-CM

## 2013-05-19 DIAGNOSIS — E785 Hyperlipidemia, unspecified: Secondary | ICD-10-CM

## 2013-05-19 DIAGNOSIS — G4700 Insomnia, unspecified: Secondary | ICD-10-CM

## 2013-05-19 DIAGNOSIS — Z1239 Encounter for other screening for malignant neoplasm of breast: Secondary | ICD-10-CM

## 2013-05-19 DIAGNOSIS — M810 Age-related osteoporosis without current pathological fracture: Secondary | ICD-10-CM

## 2013-05-19 DIAGNOSIS — K219 Gastro-esophageal reflux disease without esophagitis: Secondary | ICD-10-CM

## 2013-05-19 LAB — POCT UA - MICROSCOPIC ONLY
Bacteria, U Microscopic: NEGATIVE
Casts, Ur, LPF, POC: NEGATIVE

## 2013-05-19 LAB — COMPREHENSIVE METABOLIC PANEL
Albumin: 4.1 g/dL (ref 3.5–5.2)
BUN: 12 mg/dL (ref 6–23)
CO2: 30 mEq/L (ref 19–32)
Glucose, Bld: 100 mg/dL — ABNORMAL HIGH (ref 70–99)
Potassium: 4.1 mEq/L (ref 3.5–5.3)
Sodium: 142 mEq/L (ref 135–145)
Total Protein: 6.3 g/dL (ref 6.0–8.3)

## 2013-05-19 LAB — POCT GLYCOSYLATED HEMOGLOBIN (HGB A1C): Hemoglobin A1C: 6

## 2013-05-19 LAB — POCT URINALYSIS DIPSTICK
Glucose, UA: NEGATIVE
Nitrite, UA: NEGATIVE
Protein, UA: NEGATIVE
Spec Grav, UA: 1.01
Urobilinogen, UA: 0.2
pH, UA: 6.5

## 2013-05-19 LAB — LIPID PANEL
LDL Cholesterol: 98 mg/dL (ref 0–99)
Triglycerides: 123 mg/dL (ref <150)
VLDL: 25 mg/dL (ref 0–40)

## 2013-05-19 MED ORDER — CLOPIDOGREL BISULFATE 75 MG PO TABS: ORAL_TABLET | ORAL | Status: DC

## 2013-05-19 MED ORDER — METOPROLOL SUCCINATE ER 25 MG PO TB24: ORAL_TABLET | ORAL | Status: DC

## 2013-05-19 MED ORDER — LORAZEPAM 0.5 MG PO TABS: 0.5000 mg | ORAL_TABLET | Freq: Every evening | ORAL | Status: DC | PRN

## 2013-05-19 MED ORDER — ROSUVASTATIN CALCIUM 10 MG PO TABS: ORAL_TABLET | ORAL | Status: DC

## 2013-05-19 MED ORDER — PANTOPRAZOLE SODIUM 40 MG PO TBEC: 40.0000 mg | DELAYED_RELEASE_TABLET | Freq: Every day | ORAL | Status: DC

## 2013-05-19 MED ORDER — RANITIDINE HCL 150 MG PO TABS: ORAL_TABLET | ORAL | Status: DC

## 2013-05-19 NOTE — Progress Notes (Signed)
@[email or web address] @  Patient ID: JAELY SILMAN MRN: [id number], DOB: 1926-10-02, 79 y.o. Date of Encounter: 05/19/2013, 4:26 PM  Primary Physician: Lucilla Edin, MD  Chief Complaint: Physical (CPE)  HPI: 79 y.o. y/o female with history of noted below here for CPE.  Doing well. No issues/complaints.  LMP:  Pap: MMG: Review of Systems:  Consitutional: No fever, chills, fatigue, night sweats, lymphadenopathy, or weight changes. Eyes: No visual changes, eye redness, or discharge. ENT/Mouth: Ears: No otalgia, tinnitus, hearing loss, discharge. Nose: No congestion, rhinorrhea, sinus pain, or epistaxis. Throat: No sore throat, post nasal drip, or teeth pain. Cardiovascular: No CP, palpitations, diaphoresis, DOE, edema, orthopnea, PND she has a history of nonobstructive coronary disease and she is aspirin intolerant and currently on Plavix for. Respiratory: Gastrointestinal: No anorexia, dysphagia, reflux, pain, nausea, vomiting, hematemesis, diarrhea, constipation, BRBPR, or melena. Breast: No discharge, pain, swelling, or mass. Genitourinary: No dysuria, frequency, urgency, hematuria, incontinence, nocturia, amenorrhea, vaginal discharge, pruritis, burning, abnormal bleeding, or pain she has been bothered with recurrent urinary tract infections and is currently using Estrace cream to decrease the frequency secondary to atrophic vaginitis. Musculoskeletal: She has been having some pain in her left iliac area. She now has no pain in the hip area or midportion of her back  Skin: No rash, erythema, lesion changes, pain, warmth, jaundice, or pruritis. Neurological: No headache, dizziness, syncope, seizures, tremors, memory loss, coordination problems, or paresthesias. Psychological: No anxiety, depression, hallucinations, SI/HI. Endocrine: No fatigue, polydipsia, polyphagia, polyuria, or known diabetes. All other systems were reviewed and are otherwise negative.  Past Medical History  Diagnosis  Date  . CAD (coronary artery disease)     a. Nonobstructive by last cath in 05/2004 with 30% LAD. b. EF 65-70% 12/2010. c. Hx ASA intolerance.   . Hypercholesteremia     Hx of myalgias felt to be statin related but taking Crestor as OP  . GERD (gastroesophageal reflux disease)     a. Minimal hiatal hernia on EGD 2006, no obvious esophageal source for CP at that time.  . Hypertension   . Osteoporosis   . Aortic dilatation     Seen on cath 2005  . Colitis     12/2010 associated with transient lower GI bleeding/anemia tx with abx  . Arthritis      Past Surgical History  Procedure Laterality Date  . Back surgery      Disc disease  . Hysterectomy -- unknown type    . Shunt for syringomyelia 1987    . Temporal artery biopsy / ligation      Negative 2005  . Shoulder surgery    . Abdominal hysterectomy      Home Meds:  Prior to Admission medications   Medication Sig Start Date End Date Taking? Authorizing Provider  Calcium Carbonate-Vitamin D (CALCIUM + D PO) Take 1 tablet by mouth every morning.    Yes Historical Provider, MD  cholecalciferol (VITAMIN D) 1000 UNITS tablet Take 1,000 Units by mouth daily.   Yes Historical Provider, MD  clopidogrel (PLAVIX) 75 MG tablet TAKE 1 TABLET ONCE DAILY OR AS DIRECTED. 03/04/13  Yes Godfrey Pick, PA-C  CRESTOR 10 MG tablet TAKE ONE TABLET AT BEDTIME. 04/28/13  Yes Collene Gobble, MD  LORazepam (ATIVAN) 0.5 MG tablet Take 1 tablet (0.5 mg total) by mouth at bedtime as needed. For sleep 03/19/13  Yes Sherren Mocha, MD  metoprolol succinate (TOPROL-XL) 25 MG 24 hr tablet TAKE 1/2 TABLET DAILY. 04/06/13  Yes Viviann Spare  A Quintin Hjort, MD  Multiple Vitamins-Minerals (MULTIVITAMIN PO) Take 1 tablet by mouth daily.    Yes Historical Provider, MD  pantoprazole (PROTONIX) 40 MG tablet Take 1 tablet (40 mg total) by mouth daily. 03/17/13  Yes Collene Gobble, MD  ranitidine (ZANTAC) 150 MG tablet TAKE 1 TABLET TWICE DAILY. 02/09/13  Yes Ryan M Dunn, PA-C  ciprofloxacin (CIPRO)  500 MG tablet Take 1 tablet (500 mg total) by mouth 2 (two) times daily. 11/09/12   Thao P Le, DO  HYDROcodone-acetaminophen (NORCO) 5-325 MG per tablet Take 1 tablet by mouth every 6 (six) hours as needed for pain. 12/12/12   Collene Gobble, MD    Allergies:  Allergies  Allergen Reactions  . Aspirin Other (See Comments)    Feels like choking  . Bactrim   . Niacin And Related   . Penicillins   . Relafen (Nabumetone)   . Tetracyclines & Related     History   Social History  . Marital Status: Married    Spouse Name: N/A    Number of Children: N/A  . Years of Education: N/A   Occupational History  . Not on file.   Social History Main Topics  . Smoking status: Former Games developer  . Smokeless tobacco: Not on file  . Alcohol Use: No  . Drug Use: No  . Sexually Active: No   Other Topics Concern  . Not on file   Social History Narrative  . No narrative on file    Family History  Problem Relation Age of Onset  . Heart disease    . Heart failure    . Heart disease Mother   . Heart failure Mother   . Arthritis Mother   . Heart disease Brother     Physical Exam  Blood pressure 114/71, pulse 74, temperature 98 F (36.7 C), resp. rate 16, height 5' 1.5" (1.562 m), weight 129 lb (58.514 kg)., Body mass index is 23.98 kg/(m^2). General: Well developed, well nourished, in no acute distress. HEENT: Normocephalic, atraumatic. Conjunctiva pink, sclera non-icteric. Pupils 2 mm constricting to 1 mm, round, regular, and equally reactive to light and accomodation. EOMI. Internal auditory canal clear. TMs with good cone of light and without pathology. Nasal mucosa pink. Nares are without discharge. No sinus tenderness. Oral mucosa pink. Dentition . Pharynx without exudate.   Neck: Supple. Trachea midline. No thyromegaly. Full ROM. No lymphadenopathy. Lungs: Clear to auscultation bilaterally without wheezes, rales, or rhonchi. Breathing is of normal effort and unlabored. Cardiovascular:  RRR with S1 S2.  , rubs, or gallops appreciated. Distal pulses 2+ symmetrically. No carotid or abdominal bruits. There was a faint 1/6 systolic murmur at the lower left sternal border. Breast: There are no masses felt on breast   Abdomen: Soft, non-tender, non-distended with normoactive bowel sounds. No hepatosplenomegaly or masses. No rebound/guarding. No CVA tenderness. Without hernias.  Genitourinary: Was deferred  Musculoskeletal: Full range of motion and 5/5 strength throughout. Without swelling, atrophy, tenderness, crepitus, or warmth. Extremities without clubbing, cyanosis, or edema. Calves supple. She denied tenderness over the left iliac area appear Skin: Warm and moist without erythema, ecchymosis, wounds, or rash. Neuro: A+Ox3. CN II-XII grossly intact. Moves all extremities spontaneously. Full sensation throughout. Normal gait. DTR 2+ throughout upper and lower extremities. Finger to nose intact. Psych:  Responds to questions appropriately with a normal affect.        Assessment/Plan:  79 y.o. y/o female here for CPE. She is now on a bone density  scan because she is not going to take the medication anyway. She is willing to have   mammogram done and is willing to see about getting the shingles vaccine. She does not want any films of her hip or ilium at the present time but will return to clinic for these to be done if she continues to have discomfort  -  Signed, Earl Lites, MD 05/19/2013 4:26 PM

## 2013-05-20 LAB — VITAMIN D 25 HYDROXY (VIT D DEFICIENCY, FRACTURES): Vit D, 25-Hydroxy: 56 ng/mL (ref 30–89)

## 2013-07-28 ENCOUNTER — Encounter: Payer: Self-pay | Admitting: Emergency Medicine

## 2013-09-28 ENCOUNTER — Ambulatory Visit (INDEPENDENT_AMBULATORY_CARE_PROVIDER_SITE_OTHER): Payer: Medicare Other | Admitting: Family Medicine

## 2013-09-28 VITALS — BP 102/52 | HR 69 | Temp 97.9°F | Resp 20 | Ht 61.5 in | Wt 128.0 lb

## 2013-09-28 DIAGNOSIS — R059 Cough, unspecified: Secondary | ICD-10-CM

## 2013-09-28 DIAGNOSIS — R05 Cough: Secondary | ICD-10-CM

## 2013-09-28 DIAGNOSIS — J069 Acute upper respiratory infection, unspecified: Secondary | ICD-10-CM

## 2013-09-28 DIAGNOSIS — J029 Acute pharyngitis, unspecified: Secondary | ICD-10-CM

## 2013-09-28 MED ORDER — HYDROCODONE-HOMATROPINE 5-1.5 MG/5ML PO SYRP: 5.0000 mL | ORAL_SOLUTION | ORAL | Status: DC | PRN

## 2013-09-28 MED ORDER — BENZONATATE 100 MG PO CAPS: 100.0000 mg | ORAL_CAPSULE | Freq: Three times a day (TID) | ORAL | Status: DC | PRN

## 2013-09-28 NOTE — Patient Instructions (Addendum)
Fluids   Rest  Return if worse  Claritin or Allegra if too much drainage  Tessalon for cough  Hycodan for cough at nighttime

## 2013-09-28 NOTE — Progress Notes (Signed)
Subjective: Patient is here with a two-day history of a sore throat and cough. The cough is keeping her awake. She's bringing up clear and white phlegm. She has not been running a fever. It is the cough that is primarily bothering her.  Objective: Pleasant alert and oriented elderly lady. Other than the cough she's not in any acute distress. Her TMs are normal. Throat has posterior streaking of some postnasal drainage causing some erythema. Neck supple without significant nodes. Chest is clear to auscultation. Heart regular without murmurs gallops or arrhythmias.  Assessment: Cough Postnasal drainage causing sore throat Oral syndrome  Plan: Treat symptomatically with Hycodan, Tessalon, OTC Claritin or Allegra. Return if worse.

## 2013-10-06 ENCOUNTER — Ambulatory Visit (INDEPENDENT_AMBULATORY_CARE_PROVIDER_SITE_OTHER): Payer: Medicare Other

## 2013-10-06 DIAGNOSIS — Z23 Encounter for immunization: Secondary | ICD-10-CM

## 2013-10-26 ENCOUNTER — Telehealth: Payer: Self-pay

## 2013-10-26 NOTE — Telephone Encounter (Signed)
Patient wants a Call from Dr. Cleta Alberts regarding questions on the rosuvastatin (CRESTOR) 10 MG tablet Please [phone]

## 2013-10-26 NOTE — Telephone Encounter (Signed)
Patient states on crestor long time and she has had pain with her knees. She has had it for long duration. She d/c crestor and symptoms have gotten better. Dr Cleta Alberts advised for her to make healthy food choices walk and take fish oil  Left message for her to call me back.

## 2013-10-27 NOTE — Telephone Encounter (Signed)
Patient advised.

## 2014-01-14 ENCOUNTER — Other Ambulatory Visit: Payer: Self-pay

## 2014-01-14 DIAGNOSIS — G4700 Insomnia, unspecified: Secondary | ICD-10-CM

## 2014-01-14 NOTE — Telephone Encounter (Signed)
Pharm reqs RF of lorazepam 0.5 mg. I will pend it, but you may want to see pt again first?

## 2014-01-15 MED ORDER — LORAZEPAM 0.5 MG PO TABS: 0.5000 mg | ORAL_TABLET | Freq: Every evening | ORAL | Status: DC | PRN

## 2014-03-01 ENCOUNTER — Other Ambulatory Visit: Payer: Self-pay | Admitting: Physician Assistant

## 2014-03-01 NOTE — Telephone Encounter (Signed)
Dr Everlene Farrier, I'm not sure whether you have Rxd this for pt in past, don't see record of it. Do you want to Rx it?

## 2014-03-02 ENCOUNTER — Ambulatory Visit (INDEPENDENT_AMBULATORY_CARE_PROVIDER_SITE_OTHER): Payer: Medicare Other | Admitting: Emergency Medicine

## 2014-03-02 VITALS — BP 96/50 | HR 75 | Temp 97.9°F | Resp 16 | Ht 61.5 in | Wt 130.6 lb

## 2014-03-02 DIAGNOSIS — IMO0002 Reserved for concepts with insufficient information to code with codable children: Secondary | ICD-10-CM

## 2014-03-02 MED ORDER — CLINDAMYCIN HCL 300 MG PO CAPS: 300.0000 mg | ORAL_CAPSULE | Freq: Three times a day (TID) | ORAL | Status: DC

## 2014-03-02 NOTE — Progress Notes (Signed)
[facility] [address] [place] [address] [phone]  Date:  03/02/2014   Name:  [name]   DOB:  07-10-1926   MRN:  [id number]  PCP:  Jenny Reichmann, MD    Chief Complaint: sore toe   History of Present Illness:  [name] is a 80 y.o. very pleasant female patient who presents with the following:  No history of injury.  Has painful swollen red toe. Awoke with the pain Saturday morning.  No gout.  No improvement with over the counter medications or other home remedies. Denies other complaint or health concern today.   Patient Active Problem List   Diagnosis Date Noted  . Chest pain 07/29/2012  . CAD (coronary artery disease) 06/03/2012  . Hypercholesteremia   . GERD (gastroesophageal reflux disease)     Past Medical History  Diagnosis Date  . CAD (coronary artery disease)     a. Nonobstructive by last cath in 05/2004 with 30% LAD. b. EF 65-70% 12/2010. c. Hx ASA intolerance.   . Hypercholesteremia     Hx of myalgias felt to be statin related but taking Crestor as OP  . GERD (gastroesophageal reflux disease)     a. Minimal hiatal hernia on EGD 2006, no obvious esophageal source for CP at that time.  . Hypertension   . Osteoporosis   . Aortic dilatation     Seen on cath 2005  . Colitis     12/2010 associated with transient lower GI bleeding/anemia tx with abx  . Arthritis     Past Surgical History  Procedure Laterality Date  . Back surgery      Disc disease  . Hysterectomy -- unknown type    . Shunt for syringomyelia 1987    . Temporal artery biopsy / ligation      Negative 2005  . Shoulder surgery    . Abdominal hysterectomy      History  Substance Use Topics  . Smoking status: Former Research scientist (life sciences)  . Smokeless tobacco: Not on file  . Alcohol Use: No    Family History  Problem Relation Age of Onset  . Heart disease    . Heart failure    . Heart disease Mother   . Heart failure Mother   . Arthritis Mother   .  Heart disease Brother     Allergies  Allergen Reactions  . Aspirin Other (See Comments)    Feels like choking  . Bactrim   . Crestor [Rosuvastatin]     Lower ext myalgia  . Niacin And Related   . Penicillins   . Relafen [Nabumetone]   . Tetracyclines & Related     Medication list has been reviewed and updated.  Current Outpatient Prescriptions on File Prior to Visit  Medication Sig Dispense Refill  . cholecalciferol (VITAMIN D) 1000 UNITS tablet Take 1,000 Units by mouth daily.      . clopidogrel (PLAVIX) 75 MG tablet TAKE 1 TABLET ONCE DAILY OR AS DIRECTED.  30 tablet  11  . LORazepam (ATIVAN) 0.5 MG tablet Take 1 tablet (0.5 mg total) by mouth at bedtime as needed. For sleep  30 tablet  0  . metoprolol succinate (TOPROL-XL) 25 MG 24 hr tablet TAKE 1/2 TABLET DAILY.  30 tablet  11  . Multiple Vitamins-Minerals (MULTIVITAMIN PO) Take 1 tablet by mouth daily.       . pantoprazole (PROTONIX) 40 MG tablet Take 1 tablet (40 mg total) by mouth  daily.  90 tablet  3  . PROCTOZONE-HC 2.5 % rectal cream USE AS DIRECTED  30 g  3  . ranitidine (ZANTAC) 150 MG tablet TAKE 1 TABLET TWICE DAILY.  60 tablet  11  . benzonatate (TESSALON) 100 MG capsule Take 1-2 capsules (100-200 mg total) by mouth 3 (three) times daily as needed for cough.  40 capsule  0  . Calcium Carbonate-Vitamin D (CALCIUM + D PO) Take 1 tablet by mouth every morning.       . ciprofloxacin (CIPRO) 500 MG tablet Take 1 tablet (500 mg total) by mouth 2 (two) times daily.  20 tablet  0  . HYDROcodone-acetaminophen (NORCO) 5-325 MG per tablet Take 1 tablet by mouth every 6 (six) hours as needed for pain.  30 tablet  0  . HYDROcodone-homatropine (HYCODAN) 5-1.5 MG/5ML syrup Take 5 mLs by mouth every 4 (four) hours as needed for cough.  120 mL  0   No current facility-administered medications on file prior to visit.    Review of Systems:  As per HPI, otherwise negative.    Physical Examination: Filed Vitals:   03/02/14 1444   BP: 96/50  Pulse: 75  Temp: 97.9 F (36.6 C)  Resp: 16   Filed Vitals:   03/02/14 1444  Height: 5' 1.5" (1.562 m)  Weight: 130 lb 9.6 oz (59.24 kg)   Body mass index is 24.28 kg/(m^2). Ideal Body Weight: Weight in (lb) to have BMI = 25: 134.2   GEN: WDWN, NAD, Non-toxic, Alert & Oriented x 3 HEENT: Atraumatic, Normocephalic.  Ears and Nose: No external deformity. EXTR: No clubbing/cyanosis/edema NEURO: Normal gait.  PSYCH: Normally interactive. Conversant. Not depressed or anxious appearing.  Calm demeanor.  Right great toe with paronychia   Assessment and Plan: Paronychia Septra  Signed,  Ellison Carwin, MD

## 2014-03-02 NOTE — Patient Instructions (Signed)
Paronychia Paronychia is an inflammatory reaction involving the folds of the skin surrounding the fingernail. This is commonly caused by an infection in the skin around a nail. The most common cause of paronychia is frequent wetting of the hands (as seen with bartenders, food servers, nurses or others who wet their hands). This makes the skin around the fingernail susceptible to infection by bacteria (germs) or fungus. Other predisposing factors are:  Aggressive manicuring.  Nail biting.  Thumb sucking. The most common cause is a staphylococcal (a type of germ) infection, or a fungal (Candida) infection. When caused by a germ, it usually comes on suddenly with redness, swelling, pus and is often painful. It may get under the nail and form an abscess (collection of pus), or form an abscess around the nail. If the nail itself is infected with a fungus, the treatment is usually prolonged and may require oral medicine for up to one year. Your caregiver will determine the length of time treatment is required. The paronychia caused by bacteria (germs) may largely be avoided by not pulling on hangnails or picking at cuticles. When the infection occurs at the tips of the finger it is called felon. When the cause of paronychia is from the herpes simplex virus (HSV) it is called herpetic whitlow. TREATMENT  When an abscess is present treatment is often incision and drainage. This means that the abscess must be cut open so the pus can get out. When this is done, the following home care instructions should be followed. HOME CARE INSTRUCTIONS   It is important to keep the affected fingers very dry. Rubber or plastic gloves over cotton gloves should be used whenever the hand must be placed in water.  Keep wound clean, dry and dressed as suggested by your caregiver between warm soaks or warm compresses.  Soak in warm water for fifteen to twenty minutes three to four times per day for bacterial infections. Fungal  infections are very difficult to treat, so often require treatment for long periods of time.  For bacterial (germ) infections take antibiotics (medicine which kill germs) as directed and finish the prescription, even if the problem appears to be solved before the medicine is gone.  Only take over-the-counter or prescription medicines for pain, discomfort, or fever as directed by your caregiver. SEEK IMMEDIATE MEDICAL CARE IF:  You have redness, swelling, or increasing pain in the wound.  You notice pus coming from the wound.  You have a fever.  You notice a bad smell coming from the wound or dressing. Document Released: 06/12/2001 Document Revised: 03/10/2012 Document Reviewed: 02/11/2009 [employer] Patient Information 2014 [employer].  

## 2014-03-23 ENCOUNTER — Other Ambulatory Visit: Payer: Self-pay

## 2014-03-23 DIAGNOSIS — G4700 Insomnia, unspecified: Secondary | ICD-10-CM

## 2014-03-23 MED ORDER — LORAZEPAM 0.5 MG PO TABS: 0.5000 mg | ORAL_TABLET | Freq: Every evening | ORAL | Status: DC | PRN

## 2014-03-23 NOTE — Telephone Encounter (Signed)
Pharm reqs RF of lorazepam. Dr Everlene Farrier do you want to RF or see pt first? I have pended for 1 mos w/note needs OV for more for your review.

## 2014-03-24 ENCOUNTER — Other Ambulatory Visit: Payer: Self-pay | Admitting: Radiology

## 2014-03-24 NOTE — Telephone Encounter (Signed)
Called in.

## 2014-03-24 NOTE — Telephone Encounter (Signed)
Faxed Lorazepam to [facility]

## 2014-03-29 ENCOUNTER — Encounter: Payer: Self-pay | Admitting: Cardiology

## 2014-06-02 ENCOUNTER — Other Ambulatory Visit: Payer: Self-pay

## 2014-06-02 DIAGNOSIS — I2510 Atherosclerotic heart disease of native coronary artery without angina pectoris: Secondary | ICD-10-CM

## 2014-06-02 DIAGNOSIS — K219 Gastro-esophageal reflux disease without esophagitis: Secondary | ICD-10-CM

## 2014-06-02 MED ORDER — RANITIDINE HCL 150 MG PO TABS: ORAL_TABLET | ORAL | Status: DC

## 2014-06-02 MED ORDER — CLOPIDOGREL BISULFATE 75 MG PO TABS: ORAL_TABLET | ORAL | Status: DC

## 2014-06-08 ENCOUNTER — Ambulatory Visit (INDEPENDENT_AMBULATORY_CARE_PROVIDER_SITE_OTHER): Payer: Medicare Other

## 2014-06-08 ENCOUNTER — Encounter: Payer: Self-pay | Admitting: Emergency Medicine

## 2014-06-08 ENCOUNTER — Ambulatory Visit (INDEPENDENT_AMBULATORY_CARE_PROVIDER_SITE_OTHER): Payer: Medicare Other | Admitting: Emergency Medicine

## 2014-06-08 VITALS — BP 126/74 | HR 65 | Temp 98.1°F | Resp 16 | Ht 60.0 in | Wt 127.0 lb

## 2014-06-08 DIAGNOSIS — R059 Cough, unspecified: Secondary | ICD-10-CM

## 2014-06-08 DIAGNOSIS — M25569 Pain in unspecified knee: Secondary | ICD-10-CM

## 2014-06-08 DIAGNOSIS — R05 Cough: Secondary | ICD-10-CM

## 2014-06-08 DIAGNOSIS — R9389 Abnormal findings on diagnostic imaging of other specified body structures: Secondary | ICD-10-CM

## 2014-06-08 DIAGNOSIS — R918 Other nonspecific abnormal finding of lung field: Secondary | ICD-10-CM

## 2014-06-08 NOTE — Progress Notes (Signed)
   Subjective:    Patient ID: [name], female    DOB: 19-Jul-1926, 80 y.o.   MRN: [id number]  HPI patient enters with bilateral knee pain. Her husband has recently undergone a knee replacement. She has aching in her knees which comes and goes. She has no swelling she has no instability she has not fallen. Second problem is pain on the left lower anterior chest. She states if she leans over and gets in an awkward position she has 1-2 minutes of severe pain in her left lower ribs. This has been going on for over one year    Review of Systems     Objective:   Physical Exam patient is alert and cooperative she is not in any distress. Chest is clear to auscultation and percussion. There is significant tenderness over the left lateral 11th and 12th ribs. There isn't no abdominal mass in this area the examination of the knees reveals full range of motion. There is no instability. There is a negative drawer sign both knees. There is negative McMurray test. [facility] reading (PRIMARY) by  Dr. Everlene Farrier knee films show bilateral mild degenerative changes. Chest x-ray on AP view there is an area approximately 2 x 2 centimeters at the right cardiophrenic angle. Please comment. I could not appreciate this abnormal area on the lateral view       Assessment & Plan:  Patient referred back to Dr. Berenice Primas for her knee pain. CT scan of the chest without contrast ordered to evaluate the abnormal area seen on chest x-ray.

## 2014-06-14 ENCOUNTER — Ambulatory Visit
Admission: RE | Admit: 2014-06-14 | Discharge: 2014-06-14 | Disposition: A | Discharge status: Home / Self Care | Payer: Medicare Other | Source: Ambulatory Visit | Attending: Emergency Medicine | Admitting: Emergency Medicine

## 2014-06-14 DIAGNOSIS — R9389 Abnormal findings on diagnostic imaging of other specified body structures: Secondary | ICD-10-CM

## 2014-06-16 ENCOUNTER — Other Ambulatory Visit: Payer: Self-pay | Admitting: Emergency Medicine

## 2014-06-16 DIAGNOSIS — I719 Aortic aneurysm of unspecified site, without rupture: Secondary | ICD-10-CM

## 2014-06-18 ENCOUNTER — Telehealth: Payer: Self-pay | Admitting: *Deleted

## 2014-06-18 NOTE — Telephone Encounter (Signed)
Pt has a new 4.4 CM ascending aortic aneurism and Dr. Everlene Farrier would like you to follow up with her since you saw her in 2013. We have placed a referral to your office. He would like to speak to you in regards to this matter. Please give Dr Everlene Farrier a call on his cell phone- [phone].

## 2014-06-21 ENCOUNTER — Telehealth: Payer: Self-pay | Admitting: Cardiology

## 2014-06-21 NOTE — Telephone Encounter (Signed)
New message      Pt has a 4.4cm acceding aneursyeum.  Dr Everlene Farrier has sent Dr Martinique a message---according to the nurse.  Nurse want Dr Martinique or someone to call Dr Everlene Farrier [phone] on his cell phone.

## 2014-06-21 NOTE — Telephone Encounter (Signed)
I spoke to Dr. Everlene Farrier about this today. To schedule OV with me. Not urgent.  Kenniyah Sasaki Martinique MD, [facility]

## 2014-06-21 NOTE — Telephone Encounter (Signed)
Notified Ebony Hail at Dr. Perfecto Kingdom office-urgent care-that Dr. Martinique is not in office this afternoon.  Advised will forward message to him to call on Tuesday.  Will also forward message to his nurse Malachy Mood Pugh,LPN to see when app would be available. She states that would be fine to call on Tuesday.

## 2014-06-23 NOTE — Telephone Encounter (Signed)
Returned call to patient Dr.Jordan spoke to Dr.Daub.He advised needs to schedule appointment.Patient stated someone already called and she has appointment with Dr.Jordan 06/25/14.

## 2014-06-25 ENCOUNTER — Ambulatory Visit (INDEPENDENT_AMBULATORY_CARE_PROVIDER_SITE_OTHER): Payer: Medicare Other | Admitting: Cardiology

## 2014-06-25 ENCOUNTER — Encounter: Payer: Self-pay | Admitting: Cardiology

## 2014-06-25 VITALS — BP 121/57 | HR 54 | Ht 60.0 in | Wt 128.0 lb

## 2014-06-25 DIAGNOSIS — I712 Thoracic aortic aneurysm, without rupture, unspecified: Secondary | ICD-10-CM

## 2014-06-25 DIAGNOSIS — I2510 Atherosclerotic heart disease of native coronary artery without angina pectoris: Secondary | ICD-10-CM

## 2014-06-25 NOTE — Progress Notes (Signed)
[name] Date of Birth: Dec 30, 1926 Medical Record #[id number]  History of Present Illness: [name] is seen today for followup. She has a history of ascending aortic ectasia and mild aneurysm.  CT of the chest in 2002 showed mild aortic ectasia measured at 3.7 cm. She had some coronary calcification consistent with atherosclerosis. She had a Myoview study which was normal. She has a history of prior cardiac catheterization in 2005 which showed mild nonobstructive disease. CT in 2013 showed a maximal aortic diameter of 4.2 cm and more recent CT showed it was 4.4 cm. She remains on Plavix and metoprolol therapy. She remains active but does walk with a cane. She is no longer on statin therapy due to leg pain. She does note some chronic numbness in her left arm with walking.  Current Outpatient Prescriptions on File Prior to Visit  Medication Sig Dispense Refill  . Calcium Carbonate-Vitamin D (CALCIUM + D PO) Take 1 tablet by mouth every morning.       . cholecalciferol (VITAMIN D) 1000 UNITS tablet Take 1,000 Units by mouth daily.      . clopidogrel (PLAVIX) 75 MG tablet TAKE 1 TABLET ONCE DAILY OR AS DIRECTED.  30 tablet  0  . HYDROcodone-acetaminophen (NORCO) 5-325 MG per tablet Take 1 tablet by mouth every 6 (six) hours as needed for pain.  30 tablet  0  . LORazepam (ATIVAN) 0.5 MG tablet Take 1 tablet (0.5 mg total) by mouth at bedtime as needed. For sleep. PATIENT NEEDS OFFICE VISIT FOR ADDITIONAL REFILLS  30 tablet  0  . metoprolol succinate (TOPROL-XL) 25 MG 24 hr tablet TAKE 1/2 TABLET DAILY.  30 tablet  11  . Multiple Vitamins-Minerals (MULTIVITAMIN PO) Take 1 tablet by mouth daily.       . pantoprazole (PROTONIX) 40 MG tablet Take 1 tablet (40 mg total) by mouth daily.  90 tablet  3  . PROCTOZONE-HC 2.5 % rectal cream USE AS DIRECTED  30 g  3  . ranitidine (ZANTAC) 150 MG tablet TAKE 1 TABLET TWICE DAILY.  60 tablet  0   No current facility-administered medications on  file prior to visit.    Allergies  Allergen Reactions  . Aspirin Other (See Comments)    Feels like choking  . Bactrim   . Crestor [Rosuvastatin]     Lower ext myalgia  . Niacin And Related   . Penicillins   . Relafen [Nabumetone]   . Tetracyclines & Related     Past Medical History  Diagnosis Date  . CAD (coronary artery disease)     a. Nonobstructive by last cath in 05/2004 with 30% LAD. b. EF 65-70% 12/2010. c. Hx ASA intolerance.   . Hypercholesteremia     Hx of myalgias felt to be statin related but taking Crestor as OP  . GERD (gastroesophageal reflux disease)     a. Minimal hiatal hernia on EGD 2006, no obvious esophageal source for CP at that time.  . Hypertension   . Osteoporosis   . Aortic dilatation     Seen on cath 2005  . Colitis     12/2010 associated with transient lower GI bleeding/anemia tx with abx  . Arthritis   . Aortic aneurysm, thoracic     4.4 cm    Past Surgical History  Procedure Laterality Date  . Back surgery      Disc disease  . Hysterectomy -- unknown type    . Shunt for syringomyelia 1987    .  Temporal artery biopsy / ligation      Negative 2005  . Shoulder surgery    . Abdominal hysterectomy      History  Smoking status  . Former Smoker  Smokeless tobacco  . Not on file    History  Alcohol Use No    Family History  Problem Relation Age of Onset  . Heart disease    . Heart failure    . Heart disease Mother   . Heart failure Mother   . Arthritis Mother   . Heart disease Brother     Review of Systems: As noted in history of present illness.   All other systems were reviewed and are negative.  Physical Exam: BP 121/57  Pulse 54  Ht 5' (1.524 m)  Wt 128 lb (58.06 kg)  BMI 25.00 kg/m2 She is a pleasant elderly white female in no acute distress. HEENT exam is unremarkable. She has no JVD, adenopathy, thyromegaly, or bruits. Lungs are clear. Cardiovascular regular rate and rhythm, normal S1 and S2. Grade 2/6 systolic  murmur in the RUSB Abdomen is soft and nontender without masses or bruits. Extremities are without clubbing or edema. Pedal pulses are good. Neurologic exam she is alert and oriented x3. Cranial nerves II through XII are intact.  LABORATORY DATA: Ecg shows NSR poor R wave progression. This is unchanged from 05/19/13.  Lab Results  Component Value Date   WBC 5.1 07/30/2012   HGB 12.8 07/30/2012   HCT 37.7 07/30/2012   PLT 172 07/30/2012   GLUCOSE 100* 05/19/2013   CHOL 185 05/19/2013   TRIG 123 05/19/2013   HDL 62 05/19/2013   LDLCALC 98 05/19/2013   ALT 11 05/19/2013   AST 21 05/19/2013   NA 142 05/19/2013   K 4.1 05/19/2013   CL 106 05/19/2013   CREATININE 0.68 05/19/2013   BUN 12 05/19/2013   CO2 30 05/19/2013   TSH 3.334 07/29/2012   INR 1.07 01/31/2011   HGBA1C 6.0 05/19/2013   CT chest CT CHEST WITHOUT CONTRAST  TECHNIQUE: Multidetector CT imaging of the chest was performed following the standard protocol without IV contrast.  COMPARISON: DG CHEST 2V dated 06/08/2014; CT ANGIO CHEST AORTIC DISSECTION W/CM \\T \/OR WO/CM dated 07/29/2012; CT ANGIO CHEST W/CM &/OR WO/CM dated 03/26/2007  FINDINGS: Ascending aortic aneurysm, 4.4 cm. Coronary artery atherosclerosis. Dense mitral calcification.  Persistent left SVC. Descending aortic atherosclerosis. Calcification noted along the right glenohumeral joint capsule.  I do not observe a discrete acute rib fracture on the left. Chronic bronchiectasis and volume loss, right middle lobe. Syrinx shunt noted.  IMPRESSION: 1. I do not observe a left rib fracture or specific cause for the patient's left rib pain. 2. Ascending aortic aneurysm, 4.4 cm in diameter. 3. Ancillary findings including atherosclerosis and mitral calcification, as well as chronic bronchiectasis and volume loss in the right middle lobe.   Electronically Signed By: Sherryl Barters M.D. On: 06/14/2014 16:51   Assessment / Plan: 1. History of Nonobstructive coronary  disease with coronary calcification. Patient is  asymptomatic.  Myoview study July 2013 was normal. Continue risk factor modification.   2. Thoracic aortic aneurysm. 4.4 cm with mild growth over the past 13 years. Risk of dissection is low. BP control is good. Will repeat CT in one year to follow but I think it is unlikely that this will ever cause a clinical problem at age 79. Would not consider for repair unless there is significant growth in one year or if aneurysm  exceeds 5.5-6.0 cm. Even then this would need to be measured against risk at her advanced age.  3. History of sinus bradycardia. HR Ok on current metoprolol dose.

## 2014-07-22 ENCOUNTER — Other Ambulatory Visit: Payer: Self-pay | Admitting: Emergency Medicine

## 2014-07-29 ENCOUNTER — Telehealth: Payer: Self-pay | Admitting: Physician Assistant

## 2014-07-29 ENCOUNTER — Ambulatory Visit (INDEPENDENT_AMBULATORY_CARE_PROVIDER_SITE_OTHER): Payer: Medicare Other | Admitting: Emergency Medicine

## 2014-07-29 VITALS — BP 102/56 | HR 64 | Temp 97.6°F | Resp 16 | Ht 60.5 in | Wt 127.0 lb

## 2014-07-29 DIAGNOSIS — G458 Other transient cerebral ischemic attacks and related syndromes: Secondary | ICD-10-CM

## 2014-07-29 LAB — COMPREHENSIVE METABOLIC PANEL
ALBUMIN: 4 g/dL (ref 3.5–5.2)
ALT: 14 U/L (ref 0–35)
AST: 21 U/L (ref 0–37)
Alkaline Phosphatase: 47 U/L (ref 39–117)
BUN: 15 mg/dL (ref 6–23)
CALCIUM: 9.6 mg/dL (ref 8.4–10.5)
CHLORIDE: 102 meq/L (ref 96–112)
CO2: 31 mEq/L (ref 19–32)
CREATININE: 0.62 mg/dL (ref 0.50–1.10)
Glucose, Bld: 112 mg/dL — ABNORMAL HIGH (ref 70–99)
POTASSIUM: 4.1 meq/L (ref 3.5–5.3)
Sodium: 140 mEq/L (ref 135–145)
TOTAL PROTEIN: 6.6 g/dL (ref 6.0–8.3)
Total Bilirubin: 0.5 mg/dL (ref 0.2–1.2)

## 2014-07-29 LAB — PROTIME-INR
INR: 0.96 (ref <1.50)
PROTHROMBIN TIME: 12.8 s (ref 11.6–15.2)

## 2014-07-29 LAB — PROLACTIN: PROLACTIN: 4.4 ng/mL

## 2014-07-29 LAB — POCT CBC
GRANULOCYTE PERCENT: 76 % (ref 37–80)
HCT, POC: 44 % (ref 37.7–47.9)
Hemoglobin: 14.5 g/dL (ref 12.2–16.2)
Lymph, poc: 1.2 (ref 0.6–3.4)
MCH, POC: 30.9 pg (ref 27–31.2)
MCHC: 32.9 g/dL (ref 31.8–35.4)
MCV: 94.1 fL (ref 80–97)
MID (CBC): 0.1 (ref 0–0.9)
MPV: 7.9 fL (ref 0–99.8)
POC Granulocyte: 4.3 (ref 2–6.9)
POC LYMPH PERCENT: 21.7 %L (ref 10–50)
POC MID %: 2.3 % (ref 0–12)
Platelet Count, POC: 249 10*3/uL (ref 142–424)
RBC: 4.68 M/uL (ref 4.04–5.48)
RDW, POC: 13.8 %
WBC: 5.6 10*3/uL (ref 4.6–10.2)

## 2014-07-29 LAB — CK: Total CK: 44 U/L (ref 7–177)

## 2014-07-29 LAB — APTT: aPTT: 25 seconds (ref 24–37)

## 2014-07-29 NOTE — Telephone Encounter (Signed)
Spoke with patient and gave results. Spoke with patient's daughter and gave results Daughter quite upset regarding how long it took for [place] to inform her that he mother had a stroke. [name]- She would like a call tomorrow with neurology appointment date and time if we can do this please.  [name]'s phone number is [phone]

## 2014-07-29 NOTE — Progress Notes (Signed)
[facility] [address] [place] [address] [id number] [phone]  Date:  07/29/2014   Name:  [name]   DOB:  10-12-1926   MRN:  [id number]  PCP:  Jenny Reichmann, MD    Chief Complaint: Tremors and Fatigue   History of Present Illness:  [name] is a 80 y.o. very pleasant female patient who presents with the following:  Patient gives a history of weakness and involuntary trembling of her left arm associated with weakness of the left leg.  Lasted about 30 minutes yesterday and again this morning at about 0700.  No speech or expression difficulty.  No chest pain, tightness or pressure.  No shortness of breath.  No palpitations or tachycardia.  No visual or other neuro symptoms   Hist previous carotid duplex with 50 or greater stenosis.   No improvement with over the counter medications or other home remedies. Denies other complaint or health concern today.   Patient Active Problem List   Diagnosis Date Noted  . Aortic aneurysm, thoracic 06/25/2014  . Chest pain 07/29/2012  . CAD (coronary artery disease) 06/03/2012  . Hypercholesteremia   . GERD (gastroesophageal reflux disease)     Past Medical History  Diagnosis Date  . CAD (coronary artery disease)     a. Nonobstructive by last cath in 05/2004 with 30% LAD. b. EF 65-70% 12/2010. c. Hx ASA intolerance.   . Hypercholesteremia     Hx of myalgias felt to be statin related but taking Crestor as OP  . GERD (gastroesophageal reflux disease)     a. Minimal hiatal hernia on EGD 2006, no obvious esophageal source for CP at that time.  . Hypertension   . Osteoporosis   . Aortic dilatation     Seen on cath 2005  . Colitis     12/2010 associated with transient lower GI bleeding/anemia tx with abx  . Arthritis   . Aortic aneurysm, thoracic     4.4 cm    Past Surgical History  Procedure Laterality Date  . Back surgery      Disc disease  . Hysterectomy -- unknown type    . Shunt for  syringomyelia 1987    . Temporal artery biopsy / ligation      Negative 2005  . Shoulder surgery    . Abdominal hysterectomy      History  Substance Use Topics  . Smoking status: Former Research scientist (life sciences)  . Smokeless tobacco: Not on file  . Alcohol Use: No    Family History  Problem Relation Age of Onset  . Heart disease    . Heart failure    . Heart disease Mother   . Heart failure Mother   . Arthritis Mother   . Heart disease Brother     Allergies  Allergen Reactions  . Aspirin Other (See Comments)    Feels like choking  . Bactrim   . Crestor [Rosuvastatin]     Lower ext myalgia  . Niacin And Related   . Penicillins   . Relafen [Nabumetone]   . Tetracyclines & Related     Medication list has been reviewed and updated.  Current Outpatient Prescriptions on File Prior to Visit  Medication Sig Dispense Refill  . Calcium Carbonate-Vitamin D (CALCIUM + D PO) Take 1 tablet by mouth every morning.       . cholecalciferol (VITAMIN D) 1000 UNITS tablet Take 1,000 Units by mouth daily.      Marland Kitchen  clopidogrel (PLAVIX) 75 MG tablet TAKE 1 TABLET ONCE DAILY OR AS DIRECTED.  30 tablet  0  . ESTRACE VAGINAL 0.1 MG/GM vaginal cream       . LORazepam (ATIVAN) 0.5 MG tablet Take 1 tablet (0.5 mg total) by mouth at bedtime as needed. For sleep. PATIENT NEEDS OFFICE VISIT FOR ADDITIONAL REFILLS  30 tablet  0  . metoprolol succinate (TOPROL-XL) 25 MG 24 hr tablet Take 0.5 tablets (12.5 mg total) by mouth daily. PATIENT NEEDS BLOOD PRESSURE CHECK UP FOR ADDITIONAL REFILLS  15 tablet  0  . Multiple Vitamins-Minerals (MULTIVITAMIN PO) Take 1 tablet by mouth daily.       . pantoprazole (PROTONIX) 40 MG tablet Take 1 tablet (40 mg total) by mouth daily.  90 tablet  3  . PROCTOZONE-HC 2.5 % rectal cream USE AS DIRECTED  30 g  3  . ranitidine (ZANTAC) 150 MG tablet TAKE 1 TABLET TWICE DAILY.  60 tablet  0   No current facility-administered medications on file prior to visit.    Review of Systems:  As  per HPI, otherwise negative.    Physical Examination: Filed Vitals:   07/29/14 0917  BP: 102/56  Pulse: 64  Temp: 97.6 F (36.4 C)  Resp: 16   Filed Vitals:   07/29/14 0917  Height: 5' 0.5" (1.537 m)  Weight: 127 lb (57.607 kg)   Body mass index is 24.39 kg/(m^2). Ideal Body Weight: Weight in (lb) to have BMI = 25: 129.9  GEN: WDWN, NAD, Non-toxic, A & O x 3 HEENT: Atraumatic, Normocephalic. Neck supple. No masses, No LAD. Ears and Nose: No external deformity. CV: RRR, No M/G/R. No JVD. No thrill. No extra heart sounds.  No bruit    PULM: CTA B, no wheezes, crackles, rhonchi. No retractions. No resp. distress. No accessory muscle use. ABD: S, NT, ND, +BS. No rebound. No HSM. EXTR: No c/c/e NEURO assists gait with contact with furniture.  Chronically impaired tandem gait.  Normal romberg.  PRRERLA EOMI CN 2-12 intact. PSYCH: Normally interactive. Conversant. Not depressed or anxious appearing.  Calm demeanor.    Assessment and Plan: TIA vs seizure MR Labs EKG  Signed,  Ellison Carwin, MD   Results for orders placed in visit on 07/29/14  POCT CBC      Result Value Ref Range   WBC 5.6  4.6 - 10.2 K/uL   Lymph, poc 1.2  0.6 - 3.4   POC LYMPH PERCENT 21.7  10 - 50 %L   MID (cbc) 0.1  0 - 0.9   POC MID % 2.3  0 - 12 %M   POC Granulocyte 4.3  2 - 6.9   Granulocyte percent 76.0  37 - 80 %G   RBC 4.68  4.04 - 5.48 M/uL   Hemoglobin 14.5  12.2 - 16.2 g/dL   HCT, POC 44.0  37.7 - 47.9 %   MCV 94.1  80 - 97 fL   MCH, POC 30.9  27 - 31.2 pg   MCHC 32.9  31.8 - 35.4 g/dL   RDW, POC 13.8     Platelet Count, POC 249  142 - 424 K/uL   MPV 7.9  0 - 99.8 fL

## 2014-07-29 NOTE — Telephone Encounter (Signed)
Spoke with Dr. Ouida Sills to obtain the call report of the MRI brain the patient had done earlier today.  She and her daughter a quite anxious, and the imaging was done at an outside facility so the report is not in the EMR.  2 mm x 2 mm area in the frontal lobe that likely represents a small ischemic injury; unlikely to represent a malignancy.  Radiologist recommends repeat scan in 1-2 months.  Dr. Ouida Sills has already ordered an urgent referral to neurology, and recommends no change in the current plan.

## 2014-07-29 NOTE — Telephone Encounter (Signed)
Please see my notes regarding neurology referral (I accidentally hit close encounter instead of routing it to the referral pool)

## 2014-07-30 ENCOUNTER — Emergency Department (HOSPITAL_COMMUNITY): Payer: Medicare Other

## 2014-07-30 ENCOUNTER — Observation Stay (HOSPITAL_COMMUNITY)
Admission: EM | Admit: 2014-07-30 | Discharge: 2014-07-31 | Disposition: A | Discharge status: Home / Self Care | Payer: Medicare Other | Attending: Internal Medicine | Admitting: Internal Medicine

## 2014-07-30 ENCOUNTER — Telehealth: Payer: Self-pay

## 2014-07-30 ENCOUNTER — Encounter (HOSPITAL_COMMUNITY): Payer: Self-pay | Admitting: Emergency Medicine

## 2014-07-30 ENCOUNTER — Telehealth: Payer: Self-pay | Admitting: Emergency Medicine

## 2014-07-30 DIAGNOSIS — G459 Transient cerebral ischemic attack, unspecified: Secondary | ICD-10-CM | POA: Diagnosis not present

## 2014-07-30 DIAGNOSIS — K219 Gastro-esophageal reflux disease without esophagitis: Secondary | ICD-10-CM | POA: Diagnosis not present

## 2014-07-30 DIAGNOSIS — E7800 Pure hypercholesterolemia, unspecified: Secondary | ICD-10-CM

## 2014-07-30 DIAGNOSIS — E785 Hyperlipidemia, unspecified: Secondary | ICD-10-CM | POA: Insufficient documentation

## 2014-07-30 DIAGNOSIS — Z7902 Long term (current) use of antithrombotics/antiplatelets: Secondary | ICD-10-CM | POA: Insufficient documentation

## 2014-07-30 DIAGNOSIS — I2510 Atherosclerotic heart disease of native coronary artery without angina pectoris: Secondary | ICD-10-CM | POA: Diagnosis present

## 2014-07-30 DIAGNOSIS — I10 Essential (primary) hypertension: Secondary | ICD-10-CM

## 2014-07-30 DIAGNOSIS — I059 Rheumatic mitral valve disease, unspecified: Secondary | ICD-10-CM

## 2014-07-30 DIAGNOSIS — Z87891 Personal history of nicotine dependence: Secondary | ICD-10-CM | POA: Diagnosis not present

## 2014-07-30 DIAGNOSIS — I712 Thoracic aortic aneurysm, without rupture, unspecified: Secondary | ICD-10-CM | POA: Diagnosis not present

## 2014-07-30 DIAGNOSIS — I6529 Occlusion and stenosis of unspecified carotid artery: Secondary | ICD-10-CM | POA: Diagnosis not present

## 2014-07-30 DIAGNOSIS — R9409 Abnormal results of other function studies of central nervous system: Secondary | ICD-10-CM | POA: Diagnosis present

## 2014-07-30 LAB — HEPATIC FUNCTION PANEL
ALT: 14 U/L (ref 0–35)
AST: 21 U/L (ref 0–37)
Albumin: 3.4 g/dL — ABNORMAL LOW (ref 3.5–5.2)
Alkaline Phosphatase: 46 U/L (ref 39–117)
Total Bilirubin: 0.4 mg/dL (ref 0.3–1.2)
Total Protein: 6.4 g/dL (ref 6.0–8.3)

## 2014-07-30 LAB — CBC WITH DIFFERENTIAL/PLATELET
BASOS ABS: 0 10*3/uL (ref 0.0–0.1)
BASOS PCT: 1 % (ref 0–1)
EOS PCT: 3 % (ref 0–5)
Eosinophils Absolute: 0.1 10*3/uL (ref 0.0–0.7)
HCT: 41 % (ref 36.0–46.0)
Hemoglobin: 13.7 g/dL (ref 12.0–15.0)
LYMPHS PCT: 20 % (ref 12–46)
Lymphs Abs: 1 10*3/uL (ref 0.7–4.0)
MCH: 31.6 pg (ref 26.0–34.0)
MCHC: 33.4 g/dL (ref 30.0–36.0)
MCV: 94.5 fL (ref 78.0–100.0)
Monocytes Absolute: 0.7 10*3/uL (ref 0.1–1.0)
Monocytes Relative: 13 % — ABNORMAL HIGH (ref 3–12)
Neutro Abs: 3.2 10*3/uL (ref 1.7–7.7)
Neutrophils Relative %: 63 % (ref 43–77)
Platelets: 212 10*3/uL (ref 150–400)
RBC: 4.34 MIL/uL (ref 3.87–5.11)
RDW: 13 % (ref 11.5–15.5)
WBC: 5.1 10*3/uL (ref 4.0–10.5)

## 2014-07-30 LAB — URINE MICROSCOPIC-ADD ON

## 2014-07-30 LAB — BASIC METABOLIC PANEL
Anion gap: 11 (ref 5–15)
BUN: 13 mg/dL (ref 6–23)
CALCIUM: 9.7 mg/dL (ref 8.4–10.5)
CO2: 28 mEq/L (ref 19–32)
CREATININE: 0.62 mg/dL (ref 0.50–1.10)
Chloride: 102 mEq/L (ref 96–112)
GFR calc non Af Amer: 79 mL/min — ABNORMAL LOW (ref >90)
Glucose, Bld: 90 mg/dL (ref 70–99)
Potassium: 4.3 mEq/L (ref 3.7–5.3)
Sodium: 141 mEq/L (ref 137–147)

## 2014-07-30 LAB — CBC
HEMATOCRIT: 41.1 % (ref 36.0–46.0)
Hemoglobin: 13.4 g/dL (ref 12.0–15.0)
MCH: 30.5 pg (ref 26.0–34.0)
MCHC: 32.6 g/dL (ref 30.0–36.0)
MCV: 93.4 fL (ref 78.0–100.0)
Platelets: 217 10*3/uL (ref 150–400)
RBC: 4.4 MIL/uL (ref 3.87–5.11)
RDW: 13 % (ref 11.5–15.5)
WBC: 5.6 10*3/uL (ref 4.0–10.5)

## 2014-07-30 LAB — URINALYSIS, ROUTINE W REFLEX MICROSCOPIC
BILIRUBIN URINE: NEGATIVE
Glucose, UA: NEGATIVE mg/dL
Hgb urine dipstick: NEGATIVE
KETONES UR: NEGATIVE mg/dL
NITRITE: NEGATIVE
Protein, ur: NEGATIVE mg/dL
Specific Gravity, Urine: 1.012 (ref 1.005–1.030)
Urobilinogen, UA: 0.2 mg/dL (ref 0.0–1.0)
pH: 7.5 (ref 5.0–8.0)

## 2014-07-30 LAB — PROTIME-INR
INR: 1.05 (ref 0.00–1.49)
Prothrombin Time: 13.7 seconds (ref 11.6–15.2)

## 2014-07-30 LAB — CREATININE, SERUM
Creatinine, Ser: 0.65 mg/dL (ref 0.50–1.10)
GFR calc Af Amer: >90 mL/min (ref >90)
GFR, EST NON AFRICAN AMERICAN: 78 mL/min — AB (ref >90)

## 2014-07-30 MED ORDER — METOPROLOL SUCCINATE ER 25 MG PO TB24: 12.5000 mg | ORAL_TABLET | Freq: Every day | ORAL | Status: DC

## 2014-07-30 MED ORDER — CLOPIDOGREL BISULFATE 75 MG PO TABS: 75.0000 mg | ORAL_TABLET | ORAL | Status: DC

## 2014-07-30 MED ORDER — FAMOTIDINE 20 MG PO TABS
20.0000 mg | ORAL_TABLET | Freq: Every day | ORAL | Status: DC
  Filled 2014-07-30: qty 1

## 2014-07-30 MED ORDER — LORAZEPAM 0.5 MG PO TABS: 0.5000 mg | ORAL_TABLET | Freq: Every evening | ORAL | Status: DC | PRN

## 2014-07-30 MED ORDER — HEPARIN SODIUM (PORCINE) 5000 UNIT/ML IJ SOLN
5000.0000 [IU] | Freq: Three times a day (TID) | INTRAMUSCULAR | Status: DC
  Administered 2014-07-30 – 2014-07-31 (×3): 5000 [IU] via SUBCUTANEOUS
  Filled 2014-07-30 (×2): qty 1

## 2014-07-30 MED ORDER — CLOPIDOGREL BISULFATE 75 MG PO TABS
75.0000 mg | ORAL_TABLET | Freq: Every day | ORAL | Status: DC
  Administered 2014-07-31: 75 mg via ORAL
  Filled 2014-07-30: qty 1

## 2014-07-30 MED ORDER — PANTOPRAZOLE SODIUM 40 MG PO TBEC
40.0000 mg | DELAYED_RELEASE_TABLET | Freq: Every day | ORAL | Status: DC
  Administered 2014-07-31: 40 mg via ORAL
  Filled 2014-07-30: qty 1

## 2014-07-30 MED ORDER — CLOPIDOGREL BISULFATE 75 MG PO TABS: 75.0000 mg | ORAL_TABLET | Freq: Every day | ORAL | Status: DC

## 2014-07-30 MED ORDER — STROKE: EARLY STAGES OF RECOVERY BOOK
Freq: Once | Status: AC
  Administered 2014-07-30: 15:00:00
  Filled 2014-07-30: qty 1

## 2014-07-30 NOTE — Telephone Encounter (Signed)
I called and spoke with the patient and advised her to go to the emergency room for further evaluation. I told her I was very worried she had had 2 TIAs. I did  feel further neurological evaluation was indicated. I did fax a copy of her MRI to the emergency room. I spoke with the triage nurse in the emergency room at [facility]

## 2014-07-30 NOTE — ED Provider Notes (Signed)
Medical screening examination/treatment/procedure(s) were conducted as a shared visit with non-physician practitioner(s) and myself.  I personally evaluated the patient during the encounter.   EKG Interpretation   Date/Time:  Friday July 30 2014 10:40:01 EDT Ventricular Rate:  77 PR Interval:  152 QRS Duration: 74 QT Interval:  422 QTC Calculation: 478 R Axis:   -41 Text Interpretation:  Sinus rhythm Left anterior fascicular block Low  voltage, precordial leads No significant change since last tracing  Confirmed by Nalaya Wojdyla  MD, [place] ([id number]) on 07/30/2014 11:22:43 AM      I interviewed and examined the patient. Lungs are CTAB. Cardiac exam wnl. Abdomen soft.  Sx suspicious for TIA. Neuro consulted who will see pt. Plan for admission.   Blanchard Kelch, MD 07/30/14 [phone]

## 2014-07-30 NOTE — Progress Notes (Signed)
*  PRELIMINARY RESULTS* Vascular Ultrasound Carotid Duplex (Doppler) has been completed.  Preliminary findings: Bilateral:  1-39% ICA stenosis.  Vertebral artery flow is antegrade.      Landry Mellow, RDMS, RVT  07/30/2014, 3:50 PM

## 2014-07-30 NOTE — Consult Note (Signed)
Referring Physician: Wynelle Cleveland    Chief Complaint: transient left arm numbness and weakness 2 days ago  HPI:                                                                                                                                         [name] is an 80 y.o. female who states she was in the shower 2 days ago when she noted a ten minute period of left arm and leg numbness and slight weakness. This fully resolved. The next day she had another episode of numbness only of her left arm and leg that again lasted only ten minutes. Her daughter who was concerned for her had her go to her PCP.  MRI was obtained and showed a left frontal lobe region of concern. Patient was brought to ED. In ED she is back to her baseline with no deficits.   She takes Plavix every other day due to bruising.  She states she has chest pain and rash with ASA.   Date last known well: Date: 07/28/2014 Time last known well: Unable to determine tPA Given: No: out of window and symptoms resolved  Past Medical History  Diagnosis Date  . CAD (coronary artery disease)     a. Nonobstructive by last cath in 05/2004 with 30% LAD. b. EF 65-70% 12/2010. c. Hx ASA intolerance.   . Hypercholesteremia     Hx of myalgias felt to be statin related but taking Crestor as OP  . GERD (gastroesophageal reflux disease)     a. Minimal hiatal hernia on EGD 2006, no obvious esophageal source for CP at that time.  . Hypertension   . Osteoporosis   . Aortic dilatation     Seen on cath 2005  . Colitis     12/2010 associated with transient lower GI bleeding/anemia tx with abx  . Arthritis   . Aortic aneurysm, thoracic     4.4 cm    Past Surgical History  Procedure Laterality Date  . Back surgery      Disc disease  . Hysterectomy -- unknown type    . Shunt for syringomyelia 1987    . Temporal artery biopsy / ligation      Negative 2005  . Shoulder surgery    . Abdominal hysterectomy      Family History  Problem Relation  Age of Onset  . Heart disease    . Heart failure    . Heart disease Mother   . Heart failure Mother   . Arthritis Mother   . Heart disease Brother    Social History:  reports that she has quit smoking. She does not have any smokeless tobacco history on file. She reports that she does not drink alcohol or use illicit drugs.  Allergies:  Allergies  Allergen Reactions  . Aspirin Other (See Comments)    Feels like choking  .  Bactrim Other (See Comments)    Unknown  . Crestor [Rosuvastatin] Other (See Comments)    Lower ext myalgia  . Niacin And Related Other (See Comments)    Turns red in the face  . Penicillins Other (See Comments)    Unknown  . Relafen [Nabumetone] Other (See Comments)    Unknown  . Tetracyclines & Related Rash    Medications:                                                                                                                           Current Facility-Administered Medications  Medication Dose Route Frequency Provider Last Rate Last Dose  . clopidogrel (PLAVIX) tablet 75 mg  75 mg Oral QODAY Saima Rizwan, MD      . famotidine (PEPCID) tablet 20 mg  20 mg Oral Daily Saima Rizwan, MD      . heparin injection 5,000 Units  5,000 Units Subcutaneous 3 times per day Debbe Odea, MD   5,000 Units at 07/30/14 1424  . LORazepam (ATIVAN) tablet 0.5 mg  0.5 mg Oral QHS PRN Debbe Odea, MD      . Derrill Memo ON 08/31/2014] metoprolol succinate (TOPROL-XL) 24 hr tablet 12.5 mg  12.5 mg Oral Daily Saima Rizwan, MD      . pantoprazole (PROTONIX) EC tablet 40 mg  40 mg Oral Daily Debbe Odea, MD       Current Outpatient Prescriptions  Medication Sig Dispense Refill  . Calcium Carbonate-Vitamin D (CALCIUM + D PO) Take 1 tablet by mouth every morning.       . Carboxymethylcellul-Glycerin (LUBRICATING EYE DROPS OP) Place 1 drop into both eyes 2 (two) times daily.      . cholecalciferol (VITAMIN D) 1000 UNITS tablet Take 1,000 Units by mouth daily.      . clopidogrel  (PLAVIX) 75 MG tablet Take 75 mg by mouth every other day.      Marland Kitchen ESTRACE VAGINAL 0.1 MG/GM vaginal cream Place 1 Applicatorful vaginally once a week.       Marland Kitchen LORazepam (ATIVAN) 0.5 MG tablet Take 1 tablet (0.5 mg total) by mouth at bedtime as needed. For sleep. PATIENT NEEDS OFFICE VISIT FOR ADDITIONAL REFILLS  30 tablet  0  . metoprolol succinate (TOPROL-XL) 25 MG 24 hr tablet Take 0.5 tablets (12.5 mg total) by mouth daily. PATIENT NEEDS BLOOD PRESSURE CHECK UP FOR ADDITIONAL REFILLS  15 tablet  0  . Multiple Vitamins-Minerals (MULTIVITAMIN PO) Take 1 tablet by mouth daily.       . pantoprazole (PROTONIX) 40 MG tablet Take 1 tablet (40 mg total) by mouth daily.  90 tablet  3  . ranitidine (ZANTAC) 150 MG tablet Take 150 mg by mouth daily as needed for heartburn.         ROS:  History obtained from the patient  General ROS: negative for - chills, fatigue, fever, night sweats, weight gain or weight loss Psychological ROS: negative for - behavioral disorder, hallucinations, memory difficulties, mood swings or suicidal ideation Ophthalmic ROS: negative for - blurry vision, double vision, eye pain or loss of vision ENT ROS: negative for - epistaxis, nasal discharge, oral lesions, sore throat, tinnitus or vertigo Allergy and Immunology ROS: negative for - hives or itchy/watery eyes Hematological and Lymphatic ROS: negative for - bleeding problems, bruising or swollen lymph nodes Endocrine ROS: negative for - galactorrhea, hair pattern changes, polydipsia/polyuria or temperature intolerance Respiratory ROS: negative for - cough, hemoptysis, shortness of breath or wheezing Cardiovascular ROS: negative for - chest pain, dyspnea on exertion, edema or irregular heartbeat Gastrointestinal ROS: negative for - abdominal pain, diarrhea, hematemesis, nausea/vomiting or stool  incontinence Genito-Urinary ROS: negative for - dysuria, hematuria, incontinence or urinary frequency/urgency Musculoskeletal ROS: negative for - joint swelling or muscular weakness Neurological ROS: as noted in HPI Dermatological ROS: negative for rash and skin lesion changes  Neurologic Examination:                                                                                                      Blood pressure 105/57, pulse 52, temperature 97.9 F (36.6 C), resp. rate 16, height 5' (1.524 m), weight 58.06 kg (128 lb), SpO2 100.00%.  General: NAD Mental Status: Alert, oriented, thought content appropriate.  Speech fluent without evidence of aphasia.  Able to follow 3 step commands without difficulty. Cranial Nerves: II: Discs flat bilaterally; Visual fields grossly normal, pupils equal, round, reactive to light and accommodation III,IV, VI: ptosis not present, extra-ocular motions intact bilaterally V,VII: smile symmetric, facial light touch sensation normal bilaterally VIII: hearing normal bilaterally IX,X: gag reflex present XI: bilateral shoulder shrug XII: midline tongue extension without atrophy or fasciculations  Motor: Right : Upper extremity   5/5    Left:     Upper extremity   5/5  Lower extremity   5/5     Lower extremity   5/5 Tone and bulk:normal tone throughout; no atrophy noted Sensory: Pinprick and light touch intact throughout, bilaterally Deep Tendon Reflexes:  Right: Upper Extremity   Left: Upper extremity   biceps (C-5 to C-6) 2/4   biceps (C-5 to C-6) 2/4 tricep (C7) 2/4    triceps (C7) 2/4 Brachioradialis (C6) 2/4  Brachioradialis (C6) 2/4  Lower Extremity Lower Extremity  quadriceps (L-2 to L-4) 2/4   quadriceps (L-2 to L-4) 2/4 Achilles (S1) 2/4   Achilles (S1) 2/4  Plantars: Right: downgoing   Left: downgoing Cerebellar: normal finger-to-nose,  normal heel-to-shin test Gait: not tested CV: pulses palpable throughout    Lab Results: Basic  Metabolic Panel:  Recent Labs Lab 07/29/14 1022 07/30/14 1111  NA 140 141  K 4.1 4.3  CL 102 102  CO2 31 28  GLUCOSE 112* 90  BUN 15 13  CREATININE 0.62 0.62  CALCIUM 9.6 9.7    Liver Function Tests:  Recent Labs Lab 07/29/14 1022 07/30/14 1111  AST 21 21  ALT 14 14  ALKPHOS 47 46  BILITOT 0.5 0.4  PROT 6.6 6.4  ALBUMIN 4.0 3.4*   No results found for this basename: LIPASE, AMYLASE,  in the last 168 hours No results found for this basename: AMMONIA,  in the last 168 hours  CBC:  Recent Labs Lab 07/29/14 1029 07/30/14 1111 07/30/14 1434  WBC 5.6 5.1 5.6  NEUTROABS  --  3.2  --   HGB 14.5 13.7 13.4  HCT 44.0 41.0 41.1  MCV 94.1 94.5 93.4  PLT  --  212 217    Cardiac Enzymes:  Recent Labs Lab 07/29/14 1022  CKTOTAL 44    Lipid Panel: No results found for this basename: CHOL, TRIG, HDL, CHOLHDL, VLDL, LDLCALC,  in the last 168 hours  CBG: No results found for this basename: GLUCAP,  in the last 168 hours  Microbiology: Results for orders placed in visit on 11/09/12  URINE CULTURE     Status: None   Collection Time    11/09/12  3:37 PM      Result Value Ref Range Status   Culture ESCHERICHIA COLI   Final   Colony Count >=100,000 COLONIES/ML   Final   Organism ID, Bacteria ESCHERICHIA COLI   Final    Coagulation Studies:  Recent Labs  07/29/14 1023 07/30/14 1111  LABPROT 12.8 13.7  INR 0.96 1.05    Imaging: Dg Chest 2 View  07/30/2014   CLINICAL DATA:  Weakness and trembling and stroke-like symptoms  EXAM: CHEST  2 VIEW  COMPARISON:  PA and lateral chest x-ray of June 08, 2014  FINDINGS: The lungs mildly hypo inflated. There is new atelectasis at the right lung base. The cardiac silhouette is enlarged. The pulmonary vascularity is normal. There is calcification of the mitral valvular annulus. There is prominent thoracic kyphosis. There are degenerative changes of the right shoulder.  IMPRESSION: 1. There is new atelectasis in the right  middle and lower lobes which may be related to aspiration in the appropriate clinical setting. 2. There is no evidence of CHF.   Electronically Signed   By: David  Martinique   On: 07/30/2014 11:36    Assessment and plan discussed with with attending physician and they are in agreement.    Etta Quill PA-C [facility] [phone]  07/30/2014, 3:15 PM  Patient seen and examined.  Clinical course and management discussed.  Necessary edits performed.  I agree with the above.  Assessment and plan of care developed and discussed below.     Assessment: 80 y.o. female with transient left arm and leg decreased sensation occuring two days prior and yesterday.  Currently she is back to baseline. MRI has been reviewed and shows chronic white matter changes bilaterally but no acute CVA. Abnormality seen on left frontal lobe does not correlate with recent symptoms. Patient on Plavix QOD.    Stroke Risk Factors - hyperlipidemia, HTN, CAD  Recommend: 1. HgbA1c, fasting lipid panel 2. MRA of the brain without contrast 3. PT consult, OT consult, Speech consult 4. Echocardiogram 5. Carotid dopplers 6. Prophylactic therapy-Antiplatelet med: Plavix - dose 75 mg daily 7. Risk factor modification 8. Telemetry monitoring 9. Frequent neuro checks  Alexis Goodell, MD [facility] [phone]  07/30/2014  4:06 PM

## 2014-07-30 NOTE — Progress Notes (Signed)
  Echocardiogram 2D Echocardiogram has been performed.  [name] FRANCES 07/30/2014, 4:07 PM

## 2014-07-30 NOTE — ED Provider Notes (Signed)
CSN: [id number]     Arrival date & time 07/30/14  1011 History   First MD Initiated Contact with Patient 07/30/14 1027     Chief Complaint  Patient presents with  . Stroke Symptoms     (Consider location/radiation/quality/duration/timing/severity/associated sxs/prior Treatment) HPI  [name] is a 80 y.o. 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 As per chart review, 2 mm x 2 mm area in the frontal lobe that likely represents a small ischemic injury; unlikely to represent a malignancy. Patient reports 2x episodes of left upper extremity trembling and lower extremity weakness. Episodes fully resolved within 30 minutes. One episode was 3 days ago the last episode was yesterday. Patient has been fine with no complaints since then. Patient denies any history of change in vision, dysarthria, ataxia, falling, chest pain, shortness of breath, abdominal pain, nausea vomiting, change in bowel or bladder habits. Patient is allergic to aspirin but takes her Plavix regularly, last dose was this a.m.   Past Medical History  Diagnosis Date  . CAD (coronary artery disease)     a. Nonobstructive by last cath in 05/2004 with 30% LAD. b. EF 65-70% 12/2010. c. Hx ASA intolerance.   . Hypercholesteremia     Hx of myalgias felt to be statin related but taking Crestor as OP  . GERD (gastroesophageal reflux disease)     a. Minimal hiatal hernia on EGD 2006, no obvious esophageal source for CP at that time.  . Hypertension   . Osteoporosis   . Aortic dilatation     Seen on cath 2005  . Colitis     12/2010 associated with transient lower GI bleeding/anemia tx with abx  . Arthritis   . Aortic aneurysm, thoracic     4.4 cm   Past Surgical History  Procedure Laterality Date  . Back  surgery      Disc disease  . Hysterectomy -- unknown type    . Shunt for syringomyelia 1987    . Temporal artery biopsy / ligation      Negative 2005  . Shoulder surgery    . Abdominal hysterectomy     Family History  Problem Relation Age of Onset  . Heart disease    . Heart failure    . Heart disease Mother   . Heart failure Mother   . Arthritis Mother   . Heart disease Brother    History  Substance Use Topics  . Smoking status: Former Research scientist (life sciences)  . Smokeless tobacco: Not on file  . Alcohol Use: No   OB History   Grav Para Term Preterm Abortions TAB SAB Ect Mult Living                 Review of Systems  10 systems reviewed and found to be negative, except as noted in the HPI.   Allergies  Aspirin; Bactrim; Crestor; Niacin and related; Penicillins; Relafen; and Tetracyclines & related  Home Medications   Prior to Admission medications   Medication Sig Start Date End Date Taking? Authorizing Provider  Calcium Carbonate-Vitamin D (CALCIUM + D PO) Take 1 tablet by mouth every morning.    Yes Historical Provider, MD  Carboxymethylcellul-Glycerin (LUBRICATING EYE DROPS OP) Place 1 drop into both eyes 2 (two) times daily.   Yes Historical Provider, MD  cholecalciferol (  VITAMIN D) 1000 UNITS tablet Take 1,000 Units by mouth daily.   Yes Historical Provider, MD  clopidogrel (PLAVIX) 75 MG tablet Take 75 mg by mouth every other day.   Yes Historical Provider, MD  ESTRACE VAGINAL 0.1 MG/GM vaginal cream Place 1 Applicatorful vaginally once a week.  05/13/14  Yes Historical Provider, MD  LORazepam (ATIVAN) 0.5 MG tablet Take 1 tablet (0.5 mg total) by mouth at bedtime as needed. For sleep. PATIENT NEEDS OFFICE VISIT FOR ADDITIONAL REFILLS 03/23/14  Yes Darlyne Russian, MD  metoprolol succinate (TOPROL-XL) 25 MG 24 hr tablet Take 0.5 tablets (12.5 mg total) by mouth daily. PATIENT NEEDS BLOOD PRESSURE CHECK UP FOR ADDITIONAL REFILLS 07/22/14  Yes Darlyne Russian, MD  Multiple  Vitamins-Minerals (MULTIVITAMIN PO) Take 1 tablet by mouth daily.    Yes Historical Provider, MD  pantoprazole (PROTONIX) 40 MG tablet Take 1 tablet (40 mg total) by mouth daily. 05/19/13  Yes Darlyne Russian, MD  ranitidine (ZANTAC) 150 MG tablet Take 150 mg by mouth daily as needed for heartburn.   Yes Historical Provider, MD   BP 105/57  Pulse 52  Temp(Src) 97.9 F (36.6 C)  Resp 16  Ht 5' (1.524 m)  Wt 128 lb (58.06 kg)  BMI 25.00 kg/m2  SpO2 100% Physical Exam  Nursing note and vitals reviewed. Constitutional: She is oriented to person, place, and time. She appears well-developed and well-nourished. No distress.  HENT:  Head: Normocephalic and atraumatic.  Mouth/Throat: Oropharynx is clear and moist.  Eyes: Conjunctivae and EOM are normal. Pupils are equal, round, and reactive to light.  Neck: Normal range of motion. Neck supple.  Cardiovascular: Normal rate, regular rhythm and intact distal pulses.   Pulmonary/Chest: Effort normal and breath sounds normal. No stridor. No respiratory distress. She has no wheezes. She has no rales. She exhibits no tenderness.  Abdominal: Soft. Bowel sounds are normal. She exhibits no distension and no mass. There is no tenderness. There is no rebound and no guarding.  Musculoskeletal: Normal range of motion. She exhibits no edema and no tenderness.  Neurological: She is alert and oriented to person, place, and time.  Psychiatric: She has a normal mood and affect.    ED Course  Procedures (including critical care time) Labs Review Labs Reviewed  CBC WITH DIFFERENTIAL - Abnormal; Notable for the following:    Monocytes Relative 13 (*)    All other components within normal limits  BASIC METABOLIC PANEL - Abnormal; Notable for the following:    GFR calc non Af Amer 79 (*)    All other components within normal limits  HEPATIC FUNCTION PANEL - Abnormal; Notable for the following:    Albumin 3.4 (*)    All other components within normal limits    URINALYSIS, ROUTINE W REFLEX MICROSCOPIC - Abnormal; Notable for the following:    Leukocytes, UA TRACE (*)    All other components within normal limits  PROTIME-INR  URINE MICROSCOPIC-ADD ON  CBC  CREATININE, SERUM    Imaging Review Dg Chest 2 View  07/30/2014   CLINICAL DATA:  Weakness and trembling and stroke-like symptoms  EXAM: CHEST  2 VIEW  COMPARISON:  PA and lateral chest x-ray of June 08, 2014  FINDINGS: The lungs mildly hypo inflated. There is new atelectasis at the right lung base. The cardiac silhouette is enlarged. The pulmonary vascularity is normal. There is calcification of the mitral valvular annulus. There is prominent thoracic kyphosis. There are degenerative changes of the  right shoulder.  IMPRESSION: 1. There is new atelectasis in the right middle and lower lobes which may be related to aspiration in the appropriate clinical setting. 2. There is no evidence of CHF.   Electronically Signed   By: David  Martinique   On: 07/30/2014 11:36     EKG Interpretation   Date/Time:  Friday July 30 2014 10:40:01 EDT Ventricular Rate:  77 PR Interval:  152 QRS Duration: 74 QT Interval:  422 QTC Calculation: 478 R Axis:   -41 Text Interpretation:  Sinus rhythm Left anterior fascicular block Low  voltage, precordial leads No significant change since last tracing  Confirmed by HARRISON  MD, FORREST ([id number]) on 07/30/2014 11:22:43 AM          MDM   Final diagnoses:  Transient cerebral ischemia, unspecified transient cerebral ischemia type    Filed Vitals:   07/30/14 1115 07/30/14 1145 07/30/14 1200 07/30/14 1352  BP: 118/59 119/59 103/58 105/57  Pulse: 55 55 52   Temp:      Resp: 26 13 16    Height:      Weight:      SpO2: 93% 95% 94% 100%   [name] is a 80 y.o. female presenting with 2 episodes of left upper extremity trembling and left lower extremity weakness which are completely resolved. Neuro exam is nonfocal today. Patient had MRI which shows no acute  infarction, there is a tiny nonspecific focus of enhancement in the left frontal lobe. Patient sent to the ED by a primary care physician for TIA workup expedited neurologic evaluation.  EKG is nonischemic, no arrhythmia, blood work unremarkable. Chest x-ray with atelectasis in the right middle and lower lobes. The question aspiration. Patient with no clinical signs of aspiration pneumonia.   Rizwan lipid patient for TIA workup.  Neurology consult from Dr. Doy Mince appreciated: Neurology will consult on the floor.   Monico Blitz, PA-C 07/30/14 1446

## 2014-07-30 NOTE — Telephone Encounter (Signed)
Dr. Everlene Farrier had conversation with daughter and patient and advised them to go to [facility] ED for further evaluation based on symptoms for stroke.

## 2014-07-30 NOTE — ED Notes (Signed)
Pt in with family reporting multiple episodes over the last few days of intermittent numbness, states she went to her PCP and had an MRI completed yesterday, pt in with disk which showed some possible acute changes- pt denies any symptoms at this time or at any time today

## 2014-07-30 NOTE — H&P (Signed)
[facility] History and Physical  [name] QAS:[id number] DOB: May 19, 1926 DOA: 07/30/2014   PCP: Jenny Reichmann, MD    Chief Complaint: numbness and weakness  HPI: [name] is a 80 y.o. female with PMH of HTN, Hyperlipidemia intolerant of statin, CAD intolerant of ASA and on Plavix QOD. Patient had 2 episodes of numbness of left arm and leg along with "shaking" of left arm and weakness of left leg. One episode was 2 days ago and one yesterday. Both resolved in < 10 min and were not associated with other neurological symptoms. And MRI was preformed as outpt and showed and abnormality in the frontal lobe, no acute CVA.   General: The patient denies anorexia, fever, weight loss Cardiac: Denies chest pain, syncope, palpitations, pedal edema  Respiratory: Denies cough, shortness of breath, wheezing, cough  GI: Denies severe indigestion/heartburn, abdominal pain, nausea, vomiting, diarrhea  + constipation GU: Denies hematuria, incontinence, dysuria  Musculoskeletal: + arthritis  Skin: Denies suspicious skin lesions Neurologic: per HPI  Past Medical History  Diagnosis Date  . CAD (coronary artery disease)     a. Nonobstructive by last cath in 05/2004 with 30% LAD. b. EF 65-70% 12/2010. c. Hx ASA intolerance.   . Hypercholesteremia     Hx of myalgias felt to be statin related but taking Crestor as OP  . GERD (gastroesophageal reflux disease)     a. Minimal hiatal hernia on EGD 2006, no obvious esophageal source for CP at that time.  . Hypertension   . Osteoporosis   . Aortic dilatation     Seen on cath 2005  . Colitis     12/2010 associated with transient lower GI bleeding/anemia tx with abx  . Arthritis   . Aortic aneurysm, thoracic     4.4 cm    Past Surgical History  Procedure Laterality Date  . Back surgery      Disc disease  . Hysterectomy -- unknown type    . Shunt for syringomyelia 1987    . Temporal artery biopsy / ligation      Negative 2005  .  Shoulder surgery    . Abdominal hysterectomy      Social History:lives with husband at home, non-smoker, rare ETOH use  Allergies  Allergen Reactions  . Aspirin Other (See Comments)    Feels like choking  . Bactrim Other (See Comments)    Unknown  . Crestor [Rosuvastatin] Other (See Comments)    Lower ext myalgia  . Niacin And Related Other (See Comments)    Turns red in the face  . Penicillins Other (See Comments)    Unknown  . Relafen [Nabumetone] Other (See Comments)    Unknown  . Tetracyclines & Related Rash    Family History  Problem Relation Age of Onset  . Heart disease    . Heart failure    . Heart disease Mother   . Heart failure Mother   . Arthritis Mother   . Heart disease Brother       Prior to Admission medications   Medication Sig Start Date End Date Taking? Authorizing Provider  Calcium Carbonate-Vitamin D (CALCIUM + D PO) Take 1 tablet by mouth every morning.    Yes Historical Provider, MD  Carboxymethylcellul-Glycerin (LUBRICATING EYE DROPS OP) Place 1 drop into both eyes 2 (two) times daily.   Yes Historical Provider, MD  cholecalciferol (VITAMIN D) 1000 UNITS tablet Take 1,000 Units by mouth daily.   Yes Historical Provider, MD  clopidogrel (  PLAVIX) 75 MG tablet Take 75 mg by mouth every other day.   Yes Historical Provider, MD  ESTRACE VAGINAL 0.1 MG/GM vaginal cream Place 1 Applicatorful vaginally once a week.  05/13/14  Yes Historical Provider, MD  LORazepam (ATIVAN) 0.5 MG tablet Take 1 tablet (0.5 mg total) by mouth at bedtime as needed. For sleep. PATIENT NEEDS OFFICE VISIT FOR ADDITIONAL REFILLS 03/23/14  Yes Darlyne Russian, MD  metoprolol succinate (TOPROL-XL) 25 MG 24 hr tablet Take 0.5 tablets (12.5 mg total) by mouth daily. PATIENT NEEDS BLOOD PRESSURE CHECK UP FOR ADDITIONAL REFILLS 07/22/14  Yes Darlyne Russian, MD  Multiple Vitamins-Minerals (MULTIVITAMIN PO) Take 1 tablet by mouth daily.    Yes Historical Provider, MD  pantoprazole (PROTONIX) 40  MG tablet Take 1 tablet (40 mg total) by mouth daily. 05/19/13  Yes Darlyne Russian, MD  ranitidine (ZANTAC) 150 MG tablet Take 150 mg by mouth daily as needed for heartburn.   Yes Historical Provider, MD     Physical Exam: Filed Vitals:   07/30/14 1200 07/30/14 1352 07/30/14 1400 07/30/14 1430  BP: 103/58 105/57 121/58 156/58  Pulse: 52  57 68  Temp:      Resp: 16  24 23   Height:      Weight:      SpO2: 94% 100% 92% 95%     General: AAo x 3, no distress HEENT: Normocephalic and Atraumatic, Mucous membranes pink                PERRLA; EOM intact; No scleral icterus,                 Nares: Patent, Oropharynx: Clear, Fair Dentition                 Neck: FROM, no cervical lymphadenopathy, thyromegaly, carotid bruit or JVD;  Breasts: deferred CHEST WALL: No tenderness  CHEST: Normal respiration, clear to auscultation bilaterally  HEART: Regular rate and rhythm; no murmurs rubs or gallops  BACK: No kyphosis or scoliosis; no CVA tenderness  ABDOMEN: Positive Bowel Sounds, soft, non-tender; no masses, no organomegaly Rectal Exam: deferred EXTREMITIES: No cyanosis, clubbing, or edema Genitalia: not examined  SKIN:  no rash or ulceration  CNS: Alert and Oriented x 4, weak in right arm and right leg 4/5 (per pt this is chronic and related to musculoskeletal issues) CN 2-12 intact  Labs on Admission:  Basic Metabolic Panel:  Recent Labs Lab 07/29/14 1022 07/30/14 1111 07/30/14 1434  NA 140 141  --   K 4.1 4.3  --   CL 102 102  --   CO2 31 28  --   GLUCOSE 112* 90  --   BUN 15 13  --   CREATININE 0.62 0.62 0.65  CALCIUM 9.6 9.7  --    Liver Function Tests:  Recent Labs Lab 07/29/14 1022 07/30/14 1111  AST 21 21  ALT 14 14  ALKPHOS 47 46  BILITOT 0.5 0.4  PROT 6.6 6.4  ALBUMIN 4.0 3.4*   No results found for this basename: LIPASE, AMYLASE,  in the last 168 hours No results found for this basename: AMMONIA,  in the last 168 hours CBC:  Recent Labs Lab  07/29/14 1029 07/30/14 1111 07/30/14 1434  WBC 5.6 5.1 5.6  NEUTROABS  --  3.2  --   HGB 14.5 13.7 13.4  HCT 44.0 41.0 41.1  MCV 94.1 94.5 93.4  PLT  --  212 217   Cardiac Enzymes:  Recent Labs Lab 07/29/14 1022  CKTOTAL 44    BNP (last 3 results) No results found for this basename: PROBNP,  in the last 8760 hours CBG: No results found for this basename: GLUCAP,  in the last 168 hours  Radiological Exams on Admission: Dg Chest 2 View  07/30/2014   CLINICAL DATA:  Weakness and trembling and stroke-like symptoms  EXAM: CHEST  2 VIEW  COMPARISON:  PA and lateral chest x-ray of June 08, 2014  FINDINGS: The lungs mildly hypo inflated. There is new atelectasis at the right lung base. The cardiac silhouette is enlarged. The pulmonary vascularity is normal. There is calcification of the mitral valvular annulus. There is prominent thoracic kyphosis. There are degenerative changes of the right shoulder.  IMPRESSION: 1. There is new atelectasis in the right middle and lower lobes which may be related to aspiration in the appropriate clinical setting. 2. There is no evidence of CHF.   Electronically Signed   By: David  Martinique   On: 07/30/2014 11:36    EKG: Independently reviewed. SR at 77 bpm  Assessment/Plan Principal Problem:   TIA (transient ischemic attack) - obtain ECHO, Carotid doppler, lipid profile and Hba1c - Stroke team consult - follow on tele - no residual deficits noted  Active Problems:   Hypercholesteremia - intolerant of statin    CAD (coronary artery disease) - on Plavix QOD    HTN (hypertension) - cont home meds    Consulted:   Code Status: Full code Family Communication: none  DVT Prophylaxis:Heparin  Time spent: > 45 min  [place], MD [facility]  If 7PM-7AM, please contact night-coverage [email or web address] 07/30/2014, 4:06 PM

## 2014-07-30 NOTE — ED Notes (Signed)
Pt in hallway bed. Dr. Wynelle Cleveland at bedside. Pt will be taken up to 4N. Report has been called. Pt's dtr will accompany pt upstairs along with NT

## 2014-07-30 NOTE — Telephone Encounter (Signed)
Received a phone call from daughter [name]. She is upset that she wasn't called until 8:30 last night informing her that her mother had a stroke. She wanted to speak to a provider. She would like to know why she wasn't called until 8:30 and what the next step is.  Call back: [phone]

## 2014-07-30 NOTE — ED Notes (Signed)
Pt reports no symptoms today. However yesterday she had numbness down left side that lasted approx 10 minutes. That was her only presenting symptom. Today she is here to be further evaluated after receiving the results of her MRI scan last night. Pt has no pain, no stroke symptoms at the time.

## 2014-07-31 ENCOUNTER — Observation Stay (HOSPITAL_COMMUNITY): Payer: Medicare Other

## 2014-07-31 DIAGNOSIS — K219 Gastro-esophageal reflux disease without esophagitis: Secondary | ICD-10-CM

## 2014-07-31 LAB — LIPID PANEL
CHOL/HDL RATIO: 4.5 ratio
CHOLESTEROL: 258 mg/dL — AB (ref 0–200)
HDL: 57 mg/dL (ref >39)
LDL Cholesterol: 168 mg/dL — ABNORMAL HIGH (ref 0–99)
Triglycerides: 165 mg/dL — ABNORMAL HIGH (ref <150)
VLDL: 33 mg/dL (ref 0–40)

## 2014-07-31 LAB — HEMOGLOBIN A1C
Hgb A1c MFr Bld: 6.1 % — ABNORMAL HIGH
Mean Plasma Glucose: 128 mg/dL — ABNORMAL HIGH

## 2014-07-31 MED ORDER — ACETAMINOPHEN 325 MG PO TABS
650.0000 mg | ORAL_TABLET | Freq: Four times a day (QID) | ORAL | Status: DC | PRN
  Administered 2014-07-31: 650 mg via ORAL
  Filled 2014-07-31: qty 2

## 2014-07-31 MED ORDER — CLOPIDOGREL BISULFATE 75 MG PO TABS: 75.0000 mg | ORAL_TABLET | Freq: Every day | ORAL | Status: DC

## 2014-07-31 NOTE — Progress Notes (Signed)
UR Completed.  [name] [id number] 07/31/2014

## 2014-07-31 NOTE — Progress Notes (Signed)
OT Cancellation Note  Patient Details Name: SHARMANE DAME MRN: [id number] DOB: 22-Nov-1926   Cancelled Treatment:    Reason Eval/Treat Not Completed: OT screened, no needs identified, will sign off  Darlina Rumpf Draper, OTR/L [phone]  07/31/2014, 1:14 PM

## 2014-07-31 NOTE — Evaluation (Signed)
Physical Therapy Evaluation Patient Details Name: [name] MRN: [id number] DOB: 05-Jan-1926 Today's Date: 07/31/2014   History of Present Illness  Patient is an 80 yo female admitted 07/29/14 with transient Lt UE/LE weakness/numbness.  PMH: HTN, HLD, CAD, back surgery.  Clinical Impression  Patient demonstrates independence with mobility and gait.  Patient with good balance/safety with gait.  Reports she is at her baseline.  No acute PT needs identified - PT will sign off.    Follow Up Recommendations No PT follow up;Supervision - Intermittent    Equipment Recommendations  None recommended by PT    Recommendations for Other Services       Precautions / Restrictions Precautions Precautions: None Restrictions Weight Bearing Restrictions: No      Mobility  Bed Mobility Overal bed mobility: Independent                Transfers Overall transfer level: Independent Equipment used: None                Ambulation/Gait Ambulation/Gait assistance: Modified independent (Device/Increase time) Ambulation Distance (Feet): 200 Feet Assistive device: None Gait Pattern/deviations: Step-through pattern;Decreased stride length Gait velocity: WFL Gait velocity interpretation: at or above normal speed for age/gender General Gait Details: Patient with good balance and gait speed.  Occasionally she drifts to right/left.  No loss of balance.  Stairs            Wheelchair Mobility    Modified Rankin (Stroke Patients Only) Modified Rankin (Stroke Patients Only) Pre-Morbid Rankin Score: No symptoms Modified Rankin: No symptoms     Balance Overall balance assessment: Independent                                           Pertinent Vitals/Pain     Home Living Family/patient expects to be discharged to:: Private residence Living Arrangements: Spouse/significant other Available Help at Discharge: Family;Available 24 hours/day Type of Home:  House       Home Layout: One level Home Equipment: [place] - 2 wheels;Cane - single point;Cane - quad;Shower seat;Grab bars - toilet;Grab bars - tub/shower;Bedside commode      Prior Function Level of Independence: Independent         Comments: Reports she uses a cane occasionally.     Hand Dominance   Dominant Hand: Right    Extremity/Trunk Assessment   Upper Extremity Assessment: Overall WFL for tasks assessed           Lower Extremity Assessment: Overall WFL for tasks assessed      Cervical / Trunk Assessment: Normal  Communication   Communication: No difficulties  Cognition Arousal/Alertness: Awake/alert Behavior During Therapy: WFL for tasks assessed/performed Overall Cognitive Status: Within Functional Limits for tasks assessed                      General Comments      Exercises        Assessment/Plan    PT Assessment Patent does not need any further PT services  PT Diagnosis     PT Problem List    PT Treatment Interventions     PT Goals (Current goals can be found in the Care Plan section) Acute Rehab PT Goals PT Goal Formulation: No goals set, d/c therapy    Frequency     Barriers to discharge        Co-evaluation  End of Session Equipment Utilized During Treatment: Gait belt Activity Tolerance: Patient tolerated treatment well Patient left: in bed;with call bell/phone within reach (sitting EOB) Nurse Communication: Mobility status (No PT needs)    Functional Assessment Tool Used: Clinical judgement Functional Limitation: Mobility: Walking and moving around Mobility: Walking and Moving Around Current Status ([id number]): 0 percent impaired, limited or restricted Mobility: Walking and Moving Around Goal Status [phone]): 0 percent impaired, limited or restricted Mobility: Walking and Moving Around Discharge Status [phone]): 0 percent impaired, limited or restricted    Time: 0819-0839 PT Time Calculation  (min): 20 min   Charges:   PT Evaluation $Initial PT Evaluation Tier I: 1 Procedure PT Treatments $Gait Training: 8-22 mins   PT G Codes:   Functional Assessment Tool Used: Clinical judgement Functional Limitation: Mobility: Walking and moving around    Despina Pole 07/31/2014, 8:50 AM Carita Pian. Sanjuana Kava, [place] Pager [phone]

## 2014-07-31 NOTE — Progress Notes (Signed)
Pt d/c to home by car with family. Assessment stable. Pt verbalizes understanding of d/c instructions. 

## 2014-07-31 NOTE — Discharge Summary (Signed)
Physician Discharge Summary  [name] ZOX:[id number] DOB: May 26, 1926 DOA: 07/30/2014  PCP: Jenny Reichmann, MD  Admit date: 07/30/2014 Discharge date: 07/31/2014  Time spent: 30 minutes  Recommendations for Outpatient Follow-up:  Patient will be discharged home. She should continue her medications as prescribed. Patient should followup with her primary care physician within one week of discharge. Patient should also follow up with neurology on an as-needed basis. Patient should continue a heart healthy diet. She may resume normal physical activity as tolerated.   Discharge Diagnoses:  Principal Problem:   TIA (transient ischemic attack) Active Problems:   Hypercholesteremia   CAD (coronary artery disease)   HTN (hypertension)   Discharge Condition: Stable  Diet recommendation: Heart healthy  Filed Weights   07/30/14 1016 07/30/14 1640  Weight: 58.06 kg (128 lb) 57.017 kg (125 lb 11.2 oz)    History of present illness:  [name] is a 80 y.o. female with past medical history of hypertension, Hyperlipidemia intolerant of statin, CAD intolerant of ASA and on Plavix QOD. Patient had 2 episodes of numbness of left arm and leg along with "shaking" of left arm and weakness of left leg. Both resolved in < 10 min and were not associated with other neurological symptoms. An MRI was preformed as patient and showed an abnormality in the frontal lobe, no acute CVA.  Hospital Course:  Questionable TIA -Upon admission, patients numbness and shaking of the left side had resolved. -Outpatient MRI of the brain showed no acute CVA however abnormality in the frontal lobe -MRA: Mild small vessel disease is most prominent within the posterior circulation. No significant proximal stenosis, aneurysm, or branch vessel occlusion. -Carotid Doppler:Bilateral: 1-39% ICA stenosis. Vertebral artery flow is antegrade.  -Echocardiogram: EF of 81-19%, grade 1 diastolic dysfunction -Patient was  noncompliant with her Plavix, she was taking it every other day. Patient was counseled on this and will take her Plavix every day. She has an allergy to aspirin. -LDL 168 , Patient cannot take statins due to to intolerance, was counseled on diet modifications -Hemoglobin A1c pending -PT and OT were consulted, patient did not need followup physical therapy or occupational therapy  Hyperlipidemia -Total cholesterol 258, progress for 65, HDL 57, LDL 168 -Patient started several statins in the past and is intolerant to all of them. -Patient counseled on diet modifications.  Coronary artery disease -Currently stable, continue statin however on a daily basis. -Patient chest pain-free.  Hypertension -Controlled, continue metoprolol  GERD -Continue Zantac and Protonix  Procedures: Echocardiogram Study Conclusions - Left ventricle: The cavity size was normal. Wall thickness was increased in a pattern of mild LVH. Systolic function was vigorous. The estimated ejection fraction was in the range of 65% to 70%. Wall motion was normal; there were no regional wall motion abnormalities. There was an increased relative contribution of atrial contraction to ventricular filling. Doppler parameters are consistent with abnormal left ventricular relaxation (grade 1 diastolic dysfunction). - Aortic valve: Moderately calcified annulus. - Mitral valve: Moderately calcified annulus. There was mild regurgitation. - Left atrium: The atrium was mildly dilated.  Carotid Doppler: Bilateral: 1-39% ICA stenosis. Vertebral artery flow is antegrade.   Consultations: Neurology  Discharge Exam: Filed Vitals:   07/31/14 1007  BP: 119/63  Pulse: 78  Temp: 97.4 F (36.3 C)  Resp: 20     General: Well developed, well nourished, NAD, appears stated age  HEENT: NCAT, PERRLA, EOMI, Anicteic Sclera, mucous membranes moist.  Neck: Supple, no JVD, no masses  Cardiovascular: S1 S2 auscultated, Regular rate and  rhythm.  Respiratory: Clear to auscultation bilaterally with equal chest rise  Abdomen: Soft, nontender, nondistended, + bowel sounds  Extremities: warm dry without cyanosis clubbing or edema  Neuro: AAOx3, cranial nerves grossly intact. Strength 5/5 in patient's upper and lower extremities bilaterally  Skin: Without rashes exudates or nodules  Psych: Normal affect and demeanor with intact judgement and insight  Discharge Instructions      Discharge Instructions   Discharge instructions    Complete by:  As directed   Patient will be discharged home. She should continue her medications as prescribed. Patient should followup with her primary care physician within one week of discharge. Patient should also follow up with neurology on an as-needed basis. Patient should continue a heart healthy diet. She may resume normal physical activity as tolerated.            Medication List         CALCIUM + D PO  Take 1 tablet by mouth every morning.     cholecalciferol 1000 UNITS tablet  Commonly known as:  VITAMIN D  Take 1,000 Units by mouth daily.     clopidogrel 75 MG tablet  Commonly known as:  PLAVIX  Take 1 tablet (75 mg total) by mouth daily.     ESTRACE VAGINAL 0.1 MG/GM vaginal cream  Generic drug:  estradiol  Place 1 Applicatorful vaginally once a week.     LORazepam 0.5 MG tablet  Commonly known as:  ATIVAN  Take 1 tablet (0.5 mg total) by mouth at bedtime as needed. For sleep. PATIENT NEEDS OFFICE VISIT FOR ADDITIONAL REFILLS     LUBRICATING EYE DROPS OP  Place 1 drop into both eyes 2 (two) times daily.     metoprolol succinate 25 MG 24 hr tablet  Commonly known as:  TOPROL-XL  Take 0.5 tablets (12.5 mg total) by mouth daily. PATIENT NEEDS BLOOD PRESSURE CHECK UP FOR ADDITIONAL REFILLS     MULTIVITAMIN PO  Take 1 tablet by mouth daily.     pantoprazole 40 MG tablet  Commonly known as:  PROTONIX  Take 1 tablet (40 mg total) by mouth daily.     ranitidine  150 MG tablet  Commonly known as:  ZANTAC  Take 150 mg by mouth daily as needed for heartburn.       Allergies  Allergen Reactions  . Aspirin Other (See Comments)    Feels like choking  . Bactrim Other (See Comments)    Unknown  . Crestor [Rosuvastatin] Other (See Comments)    Lower ext myalgia  . Niacin And Related Other (See Comments)    Turns red in the face  . Penicillins Other (See Comments)    Unknown  . Relafen [Nabumetone] Other (See Comments)    Unknown  . Tetracyclines & Related Rash   Follow-up Information   Follow up with DAUB, STEVE A, MD. Schedule an appointment as soon as possible for a visit in 1 week. [facility] followup)    Specialty:  Family Medicine   Contact information:   [place] [address] [phone]        The results of significant diagnostics from this hospitalization (including imaging, microbiology, ancillary and laboratory) are listed below for reference.    Significant Diagnostic Studies: Dg Chest 2 View  07/30/2014   CLINICAL DATA:  Weakness and trembling and stroke-like symptoms  EXAM: CHEST  2 VIEW  COMPARISON:  PA and lateral chest x-ray  of June 08, 2014  FINDINGS: The lungs mildly hypo inflated. There is new atelectasis at the right lung base. The cardiac silhouette is enlarged. The pulmonary vascularity is normal. There is calcification of the mitral valvular annulus. There is prominent thoracic kyphosis. There are degenerative changes of the right shoulder.  IMPRESSION: 1. There is new atelectasis in the right middle and lower lobes which may be related to aspiration in the appropriate clinical setting. 2. There is no evidence of CHF.   Electronically Signed   By: David  Martinique   On: 07/30/2014 11:36    Microbiology: No results found for this or any previous visit (from the past 240 hour(s)).   Labs: Basic Metabolic Panel:  Recent Labs Lab 07/29/14 1022 07/30/14 1111 07/30/14 1434  NA 140 141  --   K 4.1 4.3   --   CL 102 102  --   CO2 31 28  --   GLUCOSE 112* 90  --   BUN 15 13  --   CREATININE 0.62 0.62 0.65  CALCIUM 9.6 9.7  --    Liver Function Tests:  Recent Labs Lab 07/29/14 1022 07/30/14 1111  AST 21 21  ALT 14 14  ALKPHOS 47 46  BILITOT 0.5 0.4  PROT 6.6 6.4  ALBUMIN 4.0 3.4*   No results found for this basename: LIPASE, AMYLASE,  in the last 168 hours No results found for this basename: AMMONIA,  in the last 168 hours CBC:  Recent Labs Lab 07/29/14 1029 07/30/14 1111 07/30/14 1434  WBC 5.6 5.1 5.6  NEUTROABS  --  3.2  --   HGB 14.5 13.7 13.4  HCT 44.0 41.0 41.1  MCV 94.1 94.5 93.4  PLT  --  212 217   Cardiac Enzymes:  Recent Labs Lab 07/29/14 1022  CKTOTAL 44   BNP: BNP (last 3 results) No results found for this basename: PROBNP,  in the last 8760 hours CBG: No results found for this basename: GLUCAP,  in the last 168 hours     Signed:  Cristal Ford  [facility] 07/31/2014, 12:56 PM

## 2014-07-31 NOTE — Discharge Instructions (Signed)
Transient Ischemic Attack  A transient ischemic attack (TIA) is a "warning stroke" that causes stroke-like symptoms. Unlike a stroke, a TIA does not cause permanent damage to the brain. The symptoms of a TIA can happen very fast and do not last long. It is important to know the symptoms of a TIA and what to do. This can help prevent a major stroke or death.  CAUSES   · A TIA is caused by a temporary blockage in an artery in the brain or neck (carotid artery). The blockage does not allow the brain to get the blood supply it needs and can cause different symptoms. The blockage can be caused by either:  ¨ A blood clot.  ¨ Fatty buildup (plaque) in a neck or brain artery.  RISK FACTORS  · High blood pressure (hypertension).  · High cholesterol.  · Diabetes mellitus.  · Heart disease.  · The build up of plaque in the blood vessels (peripheral artery disease or atherosclerosis).  · The build up of plaque in the blood vessels providing blood and oxygen to the brain (carotid artery stenosis).  · An abnormal heart rhythm (atrial fibrillation).  · Obesity.  · Smoking.  · Taking oral contraceptives (especially in combination with smoking).  · Physical inactivity.  · A diet high in fats, salt (sodium), and calories.  · Alcohol use.  · Use of illegal drugs (especially cocaine and methamphetamine).  · Being female.  · Being African American.  · Being over the age of 55.  · Family history of stroke.  · Previous history of blood clots, stroke, TIA, or heart attack.  · Sickle cell disease.  SYMPTOMS   TIA symptoms are the same as a stroke but are temporary. These symptoms usually develop suddenly, or may be newly present upon awakening from sleep:  · Sudden weakness or numbness of the face, arm, or leg, especially on one side of the body.  · Sudden trouble walking or difficulty moving arms or legs.  · Sudden confusion.  · Sudden personality changes.  · Trouble speaking (aphasia) or understanding.  · Difficulty swallowing.  · Sudden  trouble seeing in one or both eyes.  · Double vision.  · Dizziness.  · Loss of balance or coordination.  · Sudden severe headache with no known cause.  · Trouble reading or writing.  · Loss of bowel or bladder control.  · Loss of consciousness.  DIAGNOSIS   Your caregiver may be able to determine the presence or absence of a TIA based on your symptoms, history, and physical exam. Computed tomography (CT scan) of the brain is usually performed to help identify a TIA. Other tests may be done to diagnose a TIA. These tests may include:  · Electrocardiography.  · Continuous heart monitoring.  · Echocardiography.  · Carotid ultrasonography.  · Magnetic resonance imaging (MRI).  · A scan of the brain circulation.  · Blood tests.  PREVENTION   The risk of a TIA can be decreased by appropriately treating high blood pressure, high cholesterol, diabetes, heart disease, and obesity and by quitting smoking, limiting alcohol, and staying physically active.  TREATMENT   Time is of the essence. Since the symptoms of TIA are the same as a stroke, it is important to seek treatment as soon as possible because you may need a medicine to dissolve the clot (thrombolytic) that cannot be given if too much time has passed. Treatment options vary. Treatment options may include rest, oxygen, intravenous (IV) fluids,   and medicines to thin the blood (anticoagulants). Medicines and diet may be used to address diabetes, high blood pressure, and other risk factors. Measures will be taken to prevent short-term and long-term complications, including infection from breathing foreign material into the lungs (aspiration pneumonia), blood clots in the legs, and falls. Treatment options include procedures to either remove plaque in the carotid arteries or dilate carotid arteries that have narrowed due to plaque. Those procedures are:  · Carotid endarterectomy.  · Carotid angioplasty and stenting.  HOME CARE INSTRUCTIONS   · Take all medicines prescribed  by your caregiver. Follow the directions carefully. Medicines may be used to control risk factors for a stroke. Be sure you understand all your medicine instructions.  · You may be told to take aspirin or the anticoagulant warfarin. Warfarin needs to be taken exactly as instructed.  ¨ Taking too much or too little warfarin is dangerous. Too much warfarin increases the risk of bleeding. Too little warfarin continues to allow the risk for blood clots. While taking warfarin, you will need to have regular blood tests to measure your blood clotting time. A PT blood test measures how long it takes for blood to clot. Your PT is used to calculate another value called an INR. Your PT and INR help your caregiver to adjust your dose of warfarin. The dose can change for many reasons. It is critically important that you take warfarin exactly as prescribed.  ¨ Many foods, especially foods high in vitamin K can interfere with warfarin and affect the PT and INR. Foods high in vitamin K include spinach, kale, broccoli, cabbage, collard and turnip greens, brussels sprouts, peas, cauliflower, seaweed, and parsley as well as beef and pork liver, green tea, and soybean oil. You should eat a consistent amount of foods high in vitamin K. Avoid major changes in your diet, or notify your caregiver before changing your diet. Arrange a visit with a dietitian to answer your questions.  ¨ Many medicines can interfere with warfarin and affect the PT and INR. You must tell your caregiver about any and all medicines you take, this includes all vitamins and supplements. Be especially cautious with aspirin and anti-inflammatory medicines. Do not take or discontinue any prescribed or over-the-counter medicine except on the advice of your caregiver or pharmacist.  ¨ Warfarin can have side effects, such as excessive bruising or bleeding. You will need to hold pressure over cuts for longer than usual. Your caregiver or pharmacist will discuss other  potential side effects.  ¨ Avoid sports or activities that may cause injury or bleeding.  ¨ Be mindful when shaving, flossing your teeth, or handling sharp objects.  ¨ Alcohol can change the body's ability to handle warfarin. It is best to avoid alcoholic drinks or consume only very small amounts while taking warfarin. Notify your caregiver if you change your alcohol intake.  ¨ Notify your dentist or other caregivers before procedures.  · Eat a diet that includes 5 or more servings of fruits and vegetables each day. This may reduce the risk of stroke. Certain diets may be prescribed to address high blood pressure, high cholesterol, diabetes, or obesity.  ¨ A low-sodium, low-saturated fat, low-trans fat, low-cholesterol diet is recommended to manage high blood pressure.  ¨ A low-saturated fat, low-trans fat, low-cholesterol, and high-fiber diet may control cholesterol levels.  ¨ A controlled-carbohydrate, controlled-sugar diet is recommended to manage diabetes.  ¨ A reduced-calorie, low-sodium, low-saturated fat, low-trans fat, low-cholesterol diet is recommended to manage obesity.  ·   Maintain a healthy weight.  · Stay physically active. It is recommended that you get at least 30 minutes of activity on most or all days.  · Do not smoke.  · Limit alcohol use even if you are not taking warfarin. Moderate alcohol use is considered to be:  ¨ No more than 2 drinks each day for men.  ¨ No more than 1 drink each day for nonpregnant women.  · Stop drug abuse.  · Home safety. A safe home environment is important to reduce the risk of falls. Your caregiver may arrange for specialists to evaluate your home. Having grab bars in the bedroom and bathroom is often important. Your caregiver may arrange for equipment to be used at home, such as raised toilets and a seat for the shower.  · Follow all instructions for follow-up with your caregiver. This is very important. This includes any referrals and lab tests. Proper follow up can  prevent a stroke or another TIA from occurring.  SEEK MEDICAL CARE IF:  · You have personality changes.  · You have difficulty swallowing.  · You are seeing double.  · You have dizziness.  · You have a fever.  · You have skin breakdown.  SEEK IMMEDIATE MEDICAL CARE IF:   Any of these symptoms may represent a serious problem that is an emergency. Do not wait to see if the symptoms will go away. Get medical help right away. Call your local emergency services (911 in [place] Do not drive yourself to the hospital.  · You have sudden weakness or numbness of the face, arm, or leg, especially on one side of the body.  · You have sudden trouble walking or difficulty moving arms or legs.  · You have sudden confusion.  · You have trouble speaking (aphasia) or understanding.  · You have sudden trouble seeing in one or both eyes.  · You have a loss of balance or coordination.  · You have a sudden, severe headache with no known cause.  · You have new chest pain or an irregular heartbeat.  · You have a partial or total loss of consciousness.  MAKE SURE YOU:   · Understand these instructions.  · Will watch your condition.  · Will get help right away if you are not doing well or get worse.  Document Released: 09/26/2005 Document Revised: 12/22/2013 Document Reviewed: 03/24/2014  Exit[employer]® Patient Information ©2015 [employer]. This information is not intended to replace advice given to you by your health care provider. Make sure you discuss any questions you have with your health care provider.

## 2014-08-02 ENCOUNTER — Ambulatory Visit (INDEPENDENT_AMBULATORY_CARE_PROVIDER_SITE_OTHER): Payer: Medicare Other | Admitting: Emergency Medicine

## 2014-08-02 VITALS — BP 124/60 | HR 68 | Temp 97.4°F | Resp 18 | Ht 61.5 in | Wt 127.0 lb

## 2014-08-02 DIAGNOSIS — G458 Other transient cerebral ischemic attacks and related syndromes: Secondary | ICD-10-CM

## 2014-08-02 DIAGNOSIS — E7800 Pure hypercholesterolemia, unspecified: Secondary | ICD-10-CM

## 2014-08-02 MED ORDER — PITAVASTATIN CALCIUM 1 MG PO TABS: ORAL_TABLET | ORAL | Status: DC

## 2014-08-02 NOTE — Progress Notes (Signed)
Subjective:  This chart was scribed for [name], MD by Thea Alken, ED Scribe. This patient was seen in room 12 and the patient's care was started at 11:09 AM.  Patient ID: [name], female    DOB: Jul 30, 1926, 80 y.o.   MRN: [id number]  HPI Chief Complaint  Patient presents with  . Follow-up    was at the hospital on 07/30/14   HPI Comments: [name] is a 80 y.o. female who presents to the [facility] for a f/u regarding a recent hospitalization. pt was seen here 4 days ago by Dr. Ouida Sills for left sided weakness and trembling. At that time pt was diagnosed with TIA.  During hospitalization pt had an echocardiogram, doppler and a  MRA.   Today pt states she is doing well. She reports she did not receive a diagnoses at the hospital. Pt is hs been take plavix daily.  She denies weakness. Pt denies having a neurologist and is requesting a referral.   Past Medical History  Diagnosis Date  . CAD (coronary artery disease)     a. Nonobstructive by last cath in 05/2004 with 30% LAD. b. EF 65-70% 12/2010. c. Hx ASA intolerance.   . Hypercholesteremia     Hx of myalgias felt to be statin related but taking Crestor as OP  . GERD (gastroesophageal reflux disease)     a. Minimal hiatal hernia on EGD 2006, no obvious esophageal source for CP at that time.  . Hypertension   . Osteoporosis   . Aortic dilatation     Seen on cath 2005  . Colitis     12/2010 associated with transient lower GI bleeding/anemia tx with abx  . Arthritis   . Aortic aneurysm, thoracic     4.4 cm   Past Surgical History  Procedure Laterality Date  . Back surgery      Disc disease  . Hysterectomy -- unknown type    . Shunt for syringomyelia 1987    . Temporal artery biopsy / ligation      Negative 2005  . Shoulder surgery    . Abdominal hysterectomy     Prior to Admission medications   Medication Sig Start Date End Date Taking? Authorizing Provider  Calcium  Carbonate-Vitamin D (CALCIUM + D PO) Take 1 tablet by mouth every morning.    Yes Historical Provider, MD  Carboxymethylcellul-Glycerin (LUBRICATING EYE DROPS OP) Place 1 drop into both eyes 2 (two) times daily.   Yes Historical Provider, MD  cholecalciferol (VITAMIN D) 1000 UNITS tablet Take 1,000 Units by mouth daily.   Yes Historical Provider, MD  clopidogrel (PLAVIX) 75 MG tablet Take 1 tablet (75 mg total) by mouth daily. 07/31/14  Yes Maryann Mikhail, DO  ESTRACE VAGINAL 0.1 MG/GM vaginal cream Place 1 Applicatorful vaginally once a week.  05/13/14  Yes Historical Provider, MD  LORazepam (ATIVAN) 0.5 MG tablet Take 1 tablet (0.5 mg total) by mouth at bedtime as needed. For sleep. PATIENT NEEDS OFFICE VISIT FOR ADDITIONAL REFILLS 03/23/14  Yes Darlyne Russian, MD  metoprolol succinate (TOPROL-XL) 25 MG 24 hr tablet Take 0.5 tablets (12.5 mg total) by mouth daily. PATIENT NEEDS BLOOD PRESSURE CHECK UP FOR ADDITIONAL REFILLS 07/22/14  Yes Darlyne Russian, MD  Multiple Vitamins-Minerals (MULTIVITAMIN PO) Take 1 tablet by mouth daily.    Yes Historical Provider, MD  pantoprazole (PROTONIX) 40 MG tablet Take 1 tablet (40 mg total) by mouth daily. 05/19/13  Yes Remo Lipps  A Carla Rashad, MD  ranitidine (ZANTAC) 150 MG tablet Take 150 mg by mouth daily as needed for heartburn.   Yes Historical Provider, MD   Review of Systems  Neurological: Negative for tremors and weakness.   Objective:   Physical Exam CONSTITUTIONAL: Well developed/well nourished HEAD: Normocephalic/atraumatic EYES: EOMI/PERRL ENMT: Mucous membranes moist NECK: supple no meningeal signs SPINE:entire spine nontender CV: S1/S2 noted, no murmurs/rubs/gallops noted LUNGS: Lungs are clear to auscultation bilaterally, no apparent distress ABDOMEN: soft, nontender, no rebound or guarding GU:no cva tenderness NEURO: Pt is awake/alert, moves all extremitiesx4 EXTREMITIES: pulses normal, full ROM SKIN: warm, color normal PSYCH: no abnormalities of mood  noted   Assessment & Plan:  Patient doing well status post hospitalization with a suspected TIA. Her MRA carotid Dopplers and echo did not reveal any other source for embolic CVA. Her cholesterol significantly and she has been intolerant of all previous statin drugs will try livalo. Referral made to Dr. Leonie Man  for evaluation  I personally performed the services described in this documentation, which was scribed in my presence. The recorded information has been reviewed and is accurate.

## 2014-08-16 ENCOUNTER — Other Ambulatory Visit: Payer: Self-pay | Admitting: Emergency Medicine

## 2014-08-17 ENCOUNTER — Telehealth: Payer: Self-pay | Admitting: Neurology

## 2014-08-17 ENCOUNTER — Ambulatory Visit (INDEPENDENT_AMBULATORY_CARE_PROVIDER_SITE_OTHER): Payer: Medicare Other | Admitting: Neurology

## 2014-08-17 ENCOUNTER — Encounter: Payer: Self-pay | Admitting: Neurology

## 2014-08-17 VITALS — BP 113/73 | HR 77 | Ht 61.0 in | Wt 127.5 lb

## 2014-08-17 DIAGNOSIS — G44209 Tension-type headache, unspecified, not intractable: Secondary | ICD-10-CM | POA: Insufficient documentation

## 2014-08-17 DIAGNOSIS — G43809 Other migraine, not intractable, without status migrainosus: Secondary | ICD-10-CM | POA: Insufficient documentation

## 2014-08-17 NOTE — Telephone Encounter (Signed)
Patient at check out today states she was under the impression that Dr. Leonie Man wanted to see her in 2 months, but first available was in April. Asked if patient wanted to see nurse practictioner, but patient states Dr. Leonie Man was the one that wanted to see her. Please call patient and advise.

## 2014-08-17 NOTE — Progress Notes (Signed)
[facility] [address] [place]. [place] [address] [phone]       OFFICE CONSULT NOTE  [name] Date of Birth:  Sep 28, 1926 Medical Record Number:  [id number]   Referring MD:  [name] Reason for Referral:  TIA HPI: [name] is a 23 year pleasant Caucasian lady who had 2 episodes of transient left-sided numbness weakness and hand tremor on 07/30/2014 and 07/31/2014. She he should develop shaking of the left hand followed by [facility] supple left leg and strange and numb sensation lasting 5 minutes. She did not seek immediate attention but next day and she had a similar episode she mentioned it to her daughter who is a Librarian, academic was upset and asked her to go to the hospital. She is admitted at [facility] overnight and underwent MRI scan of the brain which I personally reviewed shows no acute infarct but shows changes of I. chronic small vessel disease. Carotid ultrasound showed no significant extracranial stenosis. Transthoracic echo showed normal ejection fraction without obvious cardiac source of embolism. Hemoglobin A1c was normal and lipid profile was elevated with total cholesterol 236 and LDL of 132. Patient had vascular risk factors of hypertension, hyperlipidemia and coronary artery disease. She was supposed to be on Plavix but was not taking it regularly. She is advised to take it now on a daily basis and started on statin for her lipids. She previously had trouble tolerating Crestor in the past and hence was put on pitavastatin this time. She states is tolerating both medications well without significant bleeding bruising or muscle aches or pain yet. She is independent and manages her own affairs. She has chronic history of headaches and states that she had seen Dr. Erling Cruz in the past and in fact underwent temporal artery biopsy which was negative. She has some occasional flashing lights followed by headaches and usually takes Tylenol  which works quite well for her. She also has tightness and tension headaches in the back of her head off and on.  ROS:   14 system review of systems is positive for shortness of breath, joint pain, aching muscles, headache, tremor, not enough sleep, disinterest in activities and birthmarks only. All other systems negative PMH:  Past Medical History  Diagnosis Date  . CAD (coronary artery disease)     a. Nonobstructive by last cath in 05/2004 with 30% LAD. b. EF 65-70% 12/2010. c. Hx ASA intolerance.   . Hypercholesteremia     Hx of myalgias felt to be statin related but taking Crestor as OP  . GERD (gastroesophageal reflux disease)     a. Minimal hiatal hernia on EGD 2006, no obvious esophageal source for CP at that time.  . Hypertension   . Osteoporosis   . Aortic dilatation     Seen on cath 2005  . Colitis     12/2010 associated with transient lower GI bleeding/anemia tx with abx  . Arthritis   . Aortic aneurysm, thoracic     4.4 cm    Social History:  History   Social History  . Marital Status: Married    Spouse Name: N/A    Number of Children: 2  . Years of Education: college    Occupational History  . retired     Social History Main Topics  . Smoking status: Former Research scientist (life sciences)  . Smokeless tobacco: Not on file  . Alcohol Use: No  . Drug Use: No  . Sexual Activity: No   Other Topics  Concern  . Not on file   Social History Narrative   Patient lives with her husband    Patient is right handed   Patient drinks coffee daily    Medications:   Current Outpatient Prescriptions on File Prior to Visit  Medication Sig Dispense Refill  . Calcium Carbonate-Vitamin D (CALCIUM + D PO) Take 1 tablet by mouth every morning.       . Carboxymethylcellul-Glycerin (LUBRICATING EYE DROPS OP) Place 1 drop into both eyes 2 (two) times daily.      . cholecalciferol (VITAMIN D) 1000 UNITS tablet Take 1,000 Units by mouth daily.      . clopidogrel (PLAVIX) 75 MG tablet Take 1 tablet (75  mg total) by mouth daily.  30 tablet  0  . ESTRACE VAGINAL 0.1 MG/GM vaginal cream Place 1 Applicatorful vaginally once a week.       Marland Kitchen LORazepam (ATIVAN) 0.5 MG tablet Take 1 tablet (0.5 mg total) by mouth at bedtime as needed. For sleep. PATIENT NEEDS OFFICE VISIT FOR ADDITIONAL REFILLS  30 tablet  0  . metoprolol succinate (TOPROL-XL) 25 MG 24 hr tablet Take 0.5 tablets (12.5 mg total) by mouth daily. PATIENT NEEDS BLOOD PRESSURE CHECK UP FOR ADDITIONAL REFILLS  15 tablet  0  . Multiple Vitamins-Minerals (MULTIVITAMIN PO) Take 1 tablet by mouth daily.       . pantoprazole (PROTONIX) 40 MG tablet Take 1 tablet (40 mg total) by mouth daily.  90 tablet  3  . Pitavastatin Calcium 1 MG TABS Take one tablet every evening  30 tablet  11  . ranitidine (ZANTAC) 150 MG tablet Take 150 mg by mouth daily as needed for heartburn.       No current facility-administered medications on file prior to visit.    Allergies:   Allergies  Allergen Reactions  . Aspirin Other (See Comments)    Feels like choking  . Bactrim Other (See Comments)    Unknown  . Crestor [Rosuvastatin] Other (See Comments)    Lower ext myalgia  . Niacin And Related Other (See Comments)    Turns red in the face  . Penicillins Other (See Comments)    Unknown  . Relafen [Nabumetone] Other (See Comments)    Unknown  . Tetracyclines & Related Rash    Physical Exam General: petite frail elderly lady not in distress., seated, in no evident distress Head: head normocephalic and atraumatic.   Neck: supple with no carotid or supraclavicular bruits Cardiovascular: regular rate and rhythm, no murmurs Musculoskeletal: no deformity Skin:  no rash/petichiae Vascular:  Normal pulses all extremities Filed Vitals:   08/17/14 0949  BP: 113/73  Pulse: 77    Neurologic Exam Mental Status: Awake and fully alert. Oriented to place and time. Recent and remote memory intact. Attention span, concentration and fund of knowledge appropriate.  Mood and affect appropriate.  Cranial Nerves: Fundoscopic exam reveals sharp disc margins. Pupils equal, briskly reactive to light. Extraocular movements full without nystagmus. Visual fields full to confrontation. Hearing intact. Facial sensation intact. Face, tongue, palate moves normally and symmetrically.  Motor: Normal bulk and tone. Normal strength in all tested extremity muscles. Sensory.: intact to touch and pinprick and vibratory.  Coordination: Rapid alternating movements normal in all extremities. Finger-to-nose and heel-to-shin performed accurately bilaterally. Gait and Station: Arises from chair without difficulty. Stance is stooped slightlyl. Gait demonstrates normal stride length and balance . Unable to heel, toe and tandem walk without difficulty.  Reflexes: 1+ and symmetric. Toes  downgoing.   NIHSS  0 Modified Rankin 0   ASSESSMENT: 29 year Caucasian lady it with 2 episodes of right hemispheric TIAs likely due to small vessel disease with multiple vascular risk factors of age, sex, hyperlipidemia, hypertension and coronary artery disease. Long-standing mixed migraine and tension headaches which appear stable    PLAN: I had a long discussion with the patient with regards to her recent TIAs and discuss risk for recurrence of stroke and TIA, I personally reviewed her imaging studies and stroke evaluation results and answered questions.I advised her to to stay on Plavix for secondary prevention and maintain strict control of lipids with LDL cholesterol goal below 100 mg percent. She is advised to do regular neck stretching exercises for her tension headaches. She was advised to return for followup in 2 months or call earlier if necessary.   Note: This document was prepared with digital dictation and possible smart phrase technology. Any transcriptional errors that result from this process are unintentional.

## 2014-08-17 NOTE — Telephone Encounter (Signed)
Patient was directed to follow up in 2 months with [name]. There is no opening until 04/2015.  Who is the patient to follow up with?

## 2014-08-17 NOTE — Telephone Encounter (Signed)
3 months with me or [name] if available

## 2014-08-17 NOTE — Patient Instructions (Signed)
I had a long discussion with the patient with regards to her recent TIAs and discuss risk for recurrence of stroke and TIA, I personally reviewed her imaging studies and stroke evaluation results and answered questions. IMP sure to stay on Plavix for secondary prevention and maintain strict control of lipids with LDL cholesterol goal below 100 mg percent. She was advised to return for followup in 2 months or call earlier if necessary.  Stroke Prevention Some medical conditions and behaviors are associated with an increased chance of having a stroke. You may prevent a stroke by making healthy choices and managing medical conditions. HOW CAN I REDUCE MY RISK OF HAVING A STROKE?   Stay physically active. Get at least 30 minutes of activity on most or all days.  Do not smoke. It may also be helpful to avoid exposure to secondhand smoke.  Limit alcohol use. Moderate alcohol use is considered to be:  No more than 2 drinks per day for men.  No more than 1 drink per day for nonpregnant women.  Eat healthy foods. This involves:  Eating 5 or more servings of fruits and vegetables a day.  Making dietary changes that address high blood pressure (hypertension), high cholesterol, diabetes, or obesity.  Manage your cholesterol levels.  Making food choices that are high in fiber and low in saturated fat, trans fat, and cholesterol may control cholesterol levels.  Take any prescribed medicines to control cholesterol as directed by your health care provider.  Manage your diabetes.  Controlling your carbohydrate and sugar intake is recommended to manage diabetes.  Take any prescribed medicines to control diabetes as directed by your health care provider.  Control your hypertension.  Making food choices that are low in salt (sodium), saturated fat, trans fat, and cholesterol is recommended to manage hypertension.  Take any prescribed medicines to control hypertension as directed by your health care  provider.  Maintain a healthy weight.  Reducing calorie intake and making food choices that are low in sodium, saturated fat, trans fat, and cholesterol are recommended to manage weight.  Stop drug abuse.  Avoid taking birth control pills.  Talk to your health care provider about the risks of taking birth control pills if you are over 70 years old, smoke, get migraines, or have ever had a blood clot.  Get evaluated for sleep disorders (sleep apnea).  Talk to your health care provider about getting a sleep evaluation if you snore a lot or have excessive sleepiness.  Take medicines only as directed by your health care provider.  For some people, aspirin or blood thinners (anticoagulants) are helpful in reducing the risk of forming abnormal blood clots that can lead to stroke. If you have the irregular heart rhythm of atrial fibrillation, you should be on a blood thinner unless there is a good reason you cannot take them.  Understand all your medicine instructions.  Make sure that other conditions (such as anemia or atherosclerosis) are addressed. SEEK IMMEDIATE MEDICAL CARE IF:   You have sudden weakness or numbness of the face, arm, or leg, especially on one side of the body.  Your face or eyelid droops to one side.  You have sudden confusion.  You have trouble speaking (aphasia) or understanding.  You have sudden trouble seeing in one or both eyes.  You have sudden trouble walking.  You have dizziness.  You have a loss of balance or coordination.  You have a sudden, severe headache with no known cause.  You have  new chest pain or an irregular heartbeat. Any of these symptoms may represent a serious problem that is an emergency. Do not wait to see if the symptoms will go away. Get medical help at once. Call your local emergency services (911 in [place] Do not drive yourself to the hospital. Document Released: 01/24/2005 Document Revised: 05/03/2014 Document Reviewed:  06/19/2013 [facility] Patient Information 2015 [place], [place]. This information is not intended to replace advice given to you by your health care provider. Make sure you discuss any questions you have with your health care provider.

## 2014-08-24 ENCOUNTER — Ambulatory Visit (INDEPENDENT_AMBULATORY_CARE_PROVIDER_SITE_OTHER): Payer: Medicare Other | Admitting: Emergency Medicine

## 2014-08-24 ENCOUNTER — Encounter: Payer: Self-pay | Admitting: Emergency Medicine

## 2014-08-24 ENCOUNTER — Other Ambulatory Visit: Payer: Self-pay | Admitting: Radiology

## 2014-08-24 VITALS — BP 120/73 | HR 62 | Temp 97.8°F | Resp 16 | Ht 61.5 in | Wt 127.0 lb

## 2014-08-24 DIAGNOSIS — E785 Hyperlipidemia, unspecified: Secondary | ICD-10-CM

## 2014-08-24 DIAGNOSIS — IMO0001 Reserved for inherently not codable concepts without codable children: Secondary | ICD-10-CM

## 2014-08-24 DIAGNOSIS — Z23 Encounter for immunization: Secondary | ICD-10-CM

## 2014-08-24 DIAGNOSIS — G459 Transient cerebral ischemic attack, unspecified: Secondary | ICD-10-CM

## 2014-08-24 DIAGNOSIS — Z0000 Encounter for general adult medical examination without abnormal findings: Secondary | ICD-10-CM

## 2014-08-24 DIAGNOSIS — M791 Myalgia, unspecified site: Secondary | ICD-10-CM

## 2014-08-24 DIAGNOSIS — G4700 Insomnia, unspecified: Secondary | ICD-10-CM

## 2014-08-24 LAB — POCT URINALYSIS DIPSTICK
BILIRUBIN UA: NEGATIVE
Glucose, UA: NEGATIVE
Ketones, UA: NEGATIVE
Nitrite, UA: NEGATIVE
PROTEIN UA: NEGATIVE
SPEC GRAV UA: 1.015
Urobilinogen, UA: 0.2
pH, UA: 7.5

## 2014-08-24 MED ORDER — CLOPIDOGREL BISULFATE 75 MG PO TABS: 75.0000 mg | ORAL_TABLET | Freq: Every day | ORAL | Status: DC

## 2014-08-24 MED ORDER — RANITIDINE HCL 150 MG PO TABS: ORAL_TABLET | ORAL | Status: DC

## 2014-08-24 MED ORDER — PANTOPRAZOLE SODIUM 40 MG PO TBEC: DELAYED_RELEASE_TABLET | ORAL | Status: DC

## 2014-08-24 MED ORDER — PITAVASTATIN CALCIUM 1 MG PO TABS: ORAL_TABLET | ORAL | Status: DC

## 2014-08-24 MED ORDER — LORAZEPAM 0.5 MG PO TABS: 0.5000 mg | ORAL_TABLET | Freq: Every evening | ORAL | Status: DC | PRN

## 2014-08-24 MED ORDER — METOPROLOL SUCCINATE ER 25 MG PO TB24: 12.5000 mg | ORAL_TABLET | Freq: Every day | ORAL | Status: DC

## 2014-08-24 NOTE — Telephone Encounter (Signed)
Faxed ativan

## 2014-08-24 NOTE — Progress Notes (Signed)
Subjective:  This chart was scribed for Karen Queen, MD by Donato Schultz, Medical Scribe. This patient was seen in Room 21 and the patient's care was started at 10:14 AM.   Patient ID: [name], female    DOB: 09-01-1926, 80 y.o.   MRN: [id number]  HPI HPI Comments: [name] is a 80 y.o. female who presents to the [facility] for an annual exam.  There is a hard knot in her left calf that she noticed three days ago that produced pain when walking.  She states that she no longer can feel the knot but believes she may be having a reaction to crestor.  She has another appointment with her neurologist in 2 months.    She sees her eye doctor once a year.  She sees her cardiologist once a year.  She no longer has to get colonoscopies.  She has not received a mammogram this year.  She has not had a bone density test in the last few years.     Past Medical History  Diagnosis Date  . CAD (coronary artery disease)     a. Nonobstructive by last cath in 05/2004 with 30% LAD. b. EF 65-70% 12/2010. c. Hx ASA intolerance.   . Hypercholesteremia     Hx of myalgias felt to be statin related but taking Crestor as OP  . GERD (gastroesophageal reflux disease)     a. Minimal hiatal hernia on EGD 2006, no obvious esophageal source for CP at that time.  . Hypertension   . Osteoporosis   . Aortic dilatation     Seen on cath 2005  . Colitis     12/2010 associated with transient lower GI bleeding/anemia tx with abx  . Arthritis   . Aortic aneurysm, thoracic     4.4 cm   Past Surgical History  Procedure Laterality Date  . Back surgery      Disc disease  . Hysterectomy -- unknown type    . Shunt for syringomyelia 1987    . Temporal artery biopsy / ligation      Negative 2005  . Shoulder surgery    . Abdominal hysterectomy     Family History  Problem Relation Age of Onset  . Heart disease    . Heart failure    . Heart disease Mother   . Heart failure Mother     . Arthritis Mother   . Heart disease Brother    History   Social History  . Marital Status: Married    Spouse Name: N/A    Number of Children: 2  . Years of Education: college    Occupational History  . retired     Social History Main Topics  . Smoking status: Former Research scientist (life sciences)  . Smokeless tobacco: Not on file  . Alcohol Use: No  . Drug Use: No  . Sexual Activity: No   Other Topics Concern  . Not on file   Social History Narrative   Patient lives with her husband    Patient is right handed   Patient drinks coffee daily   Allergies  Allergen Reactions  . Aspirin Other (See Comments)    Feels like choking  . Bactrim Other (See Comments)    Unknown  . Crestor [Rosuvastatin] Other (See Comments)    Lower ext myalgia  . Niacin And Related Other (See Comments)    Turns red in the face  . Penicillins Other (See Comments)  Unknown  . Relafen [Nabumetone] Other (See Comments)    Unknown  . Tetracyclines & Related Rash    Review of Systems  All other systems reviewed and are negative.    Objective:  Physical Exam  Nursing note and vitals reviewed. Constitutional: She is oriented to person, place, and time. She appears well-developed and well-nourished.  HENT:  Head: Normocephalic and atraumatic.  Right Ear: External ear normal.  Left Ear: External ear normal.  Nose: Nose normal.  Mouth/Throat: Oropharynx is clear and moist. No oropharyngeal exudate.  Eyes: Conjunctivae and EOM are normal. Pupils are equal, round, and reactive to light.  Neck: Normal range of motion. Neck supple. No thyromegaly present.  Cardiovascular: Normal rate, regular rhythm and normal heart sounds.  Exam reveals no gallop and no friction rub.   No murmur heard. Pulmonary/Chest: Effort normal and breath sounds normal. No respiratory distress. She has no wheezes. She has no rales.  Breasts are normal bilaterally.  Abdominal: Soft. Bowel sounds are normal. There is no tenderness.   Genitourinary:  Exam deferred.  Musculoskeletal: Normal range of motion.  Lymphadenopathy:    She has no cervical adenopathy.  Neurological: She is alert and oriented to person, place, and time.  Skin: Skin is warm and dry.  Varicose veins in both legs.  Psychiatric: She has a normal mood and affect. Her behavior is normal.     BP 120/73  Pulse 62  Temp(Src) 97.8 F (36.6 C)  Resp 16  Ht 5' 1.5" (1.562 m)  Wt 127 lb (57.607 kg)  BMI 23.61 kg/m2  SpO2 99% Assessment & Plan:  Patient doing well. She has had no further neurological episodes. She has been to see Dr. Leonie Man. I encouraged her to exercise well. She was given flu vaccine and Prevnar vaccine today I personally performed the services described in this documentation, which was scribed in my presence. The recorded information has been reviewed and is accurate.

## 2014-08-25 LAB — LIPID PANEL
Cholesterol: 211 mg/dL — ABNORMAL HIGH (ref 0–200)
HDL: 61 mg/dL (ref >39)
LDL Cholesterol: 121 mg/dL — ABNORMAL HIGH (ref 0–99)
Total CHOL/HDL Ratio: 3.5 Ratio
Triglycerides: 147 mg/dL (ref <150)
VLDL: 29 mg/dL (ref 0–40)

## 2014-08-25 LAB — CK: Total CK: 36 U/L (ref 7–177)

## 2014-09-06 ENCOUNTER — Ambulatory Visit (INDEPENDENT_AMBULATORY_CARE_PROVIDER_SITE_OTHER): Payer: Medicare Other | Admitting: Family Medicine

## 2014-09-06 VITALS — BP 130/60 | HR 60 | Temp 98.1°F | Resp 16 | Ht 61.0 in | Wt 128.8 lb

## 2014-09-06 DIAGNOSIS — L03031 Cellulitis of right toe: Secondary | ICD-10-CM

## 2014-09-06 DIAGNOSIS — L03039 Cellulitis of unspecified toe: Secondary | ICD-10-CM

## 2014-09-06 MED ORDER — CLINDAMYCIN HCL 150 MG PO CAPS: 150.0000 mg | ORAL_CAPSULE | Freq: Three times a day (TID) | ORAL | Status: DC

## 2014-09-06 NOTE — Progress Notes (Signed)
[facility] [address] [place] [address] [id number]  Date:  09/06/2014   Name:  [name]   DOB:  03-Oct-1926   MRN:  [id number]  PCP:  Jenny Reichmann, MD    Chief Complaint: Toe Pain   History of Present Illness:  [name] is a 80 y.o. very pleasant female patient who presents with the following:  She is here today with a problem with the right great toe for about 2 weeks.  She had injured the nail a few months ago, it just fell off recently and she already had a good amount of new nail growth when the old nail came off. She had noted an infection around the nail and has applied neosporin.  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   Otherwise she is feeling well  Patient Active Problem List   Diagnosis Date Noted  . Variants of migraine, not elsewhere classified, without mention of intractable migraine without mention of status migrainosus 08/17/2014  . Tension headache 08/17/2014  . TIA (transient ischemic attack) 07/30/2014  . HTN (hypertension) 07/30/2014  . Aortic aneurysm, thoracic 06/25/2014  . Chest pain 07/29/2012  . CAD (coronary artery disease) 06/03/2012  . Hypercholesteremia   . GERD (gastroesophageal reflux disease)     Past Medical History  Diagnosis Date  . CAD (coronary artery disease)     a. Nonobstructive by last cath in 05/2004 with 30% LAD. b. EF 65-70% 12/2010. c. Hx ASA intolerance.   . Hypercholesteremia     Hx of myalgias felt to be statin related but taking Crestor as OP  . GERD (gastroesophageal reflux disease)     a. Minimal hiatal hernia on EGD 2006, no obvious esophageal source for CP at that time.  . Hypertension   . Osteoporosis   . Aortic dilatation     Seen on cath 2005  . Colitis     12/2010 associated with transient lower GI bleeding/anemia tx with abx  . Arthritis   . Aortic aneurysm, thoracic     4.4 cm    Past Surgical History  Procedure Laterality Date  . Back surgery      Disc disease  . Hysterectomy -- unknown type    . Shunt for syringomyelia 1987    . Temporal artery biopsy / ligation      Negative 2005  . Shoulder surgery    . Abdominal hysterectomy      History  Substance Use Topics  . Smoking status: Former Research scientist (life sciences)  . Smokeless tobacco: Not on file  . Alcohol Use: No    Family History  Problem Relation Age of Onset  . Heart disease    . Heart failure    . Heart disease Mother   . Heart failure Mother   . Arthritis Mother   . Heart disease Brother     Allergies  Allergen Reactions  . Aspirin Other (See Comments)    Feels like choking  . Bactrim Other (See Comments)    Unknown  . Crestor [Rosuvastatin] Other (See Comments)    Lower ext myalgia  . Niacin And Related Other (See Comments)    Turns red in the face  . Penicillins Other (See Comments)    Unknown  . Relafen [Nabumetone] Other (See Comments)    Unknown  . Tetracyclines & Related Rash  Medication list has been reviewed and updated.  Current Outpatient Prescriptions on File Prior to Visit  Medication Sig Dispense Refill  . Calcium Carbonate-Vitamin D (CALCIUM + D PO) Take 1 tablet by mouth every morning.       . Carboxymethylcellul-Glycerin (LUBRICATING EYE DROPS OP) Place 1 drop into both eyes 2 (two) times daily.      . cholecalciferol (VITAMIN D) 1000 UNITS tablet Take 1,000 Units by mouth daily.      . clopidogrel (PLAVIX) 75 MG tablet Take 1 tablet (75 mg total) by mouth daily.  90 tablet  1  . ESTRACE VAGINAL 0.1 MG/GM vaginal cream Place 1 Applicatorful vaginally once a week.       Marland Kitchen LORazepam (ATIVAN) 0.5 MG tablet Take 1 tablet (0.5 mg total) by mouth at bedtime as needed. For sleep.  30 tablet  2  . metoprolol succinate (TOPROL-XL) 25 MG 24 hr tablet Take 0.5 tablets (12.5 mg total) by mouth daily.  90 tablet  1  . Multiple Vitamins-Minerals (MULTIVITAMIN PO) Take 1 tablet  by mouth daily.       . pantoprazole (PROTONIX) 40 MG tablet TAKE 1 TABLET AT NIGHT.  90 tablet  1  . Pitavastatin Calcium 1 MG TABS Take one tablet every evening  90 tablet  1  . ranitidine (ZANTAC) 150 MG tablet TAKE 1 TABLET TWICE DAILY.  180 tablet  1   No current facility-administered medications on file prior to visit.    Review of Systems:  As per HPI- otherwise negative.   Physical Examination: Filed Vitals:   09/06/14 1255  BP: 130/60  Pulse: 60  Temp: 98.1 F (36.7 C)  Resp: 16   Filed Vitals:   09/06/14 1255  Height: 5\' 1"  (1.549 m)  Weight: 128 lb 12.8 oz (58.423 kg)   Body mass index is 24.35 kg/(m^2). Ideal Body Weight: Weight in (lb) to have BMI = 25: 132  GEN: WDWN, NAD, Non-toxic, A & O x 3, looks well HEENT: Atraumatic, Normocephalic. Neck supple. No masses, No LAD. Ears and Nose: No external deformity. CV: RRR, No M/G/R. No JVD. No thrill. No extra heart sounds. PULM: CTA B, no wheezes, crackles, rhonchi. No retractions. No resp. distress. No accessory muscle use. EXTR: No c/c/e NEURO Normal gait.  PSYCH: Normally interactive. Conversant. Not depressed or anxious appearing.  Calm demeanor.  Right great toe: the toenail is growing in- it shows minimal redness around the base of the nail.  No ingrown, no fluctuance or pus visible  Assessment and Plan: Paronychia of great toe, right - Plan: clindamycin (CLEOCIN) 150 MG capsule  Mild paronychia.  Treat with clindamycin as she has allergies to tetracyclines and penicillins.  She will let me know if not better soon.  Encouraged her to keep the skin pulled back from the edge of the new nail to prevent ingrown.    Signed Lamar Blinks, MD

## 2014-09-06 NOTE — Patient Instructions (Signed)
Use the clindamycin antibiotic as directed for 7- 19 days.  Let me know if your toe is not doing better

## 2014-09-28 ENCOUNTER — Encounter: Payer: Self-pay | Admitting: Emergency Medicine

## 2014-10-25 ENCOUNTER — Encounter: Payer: Self-pay | Admitting: Nurse Practitioner

## 2014-10-25 ENCOUNTER — Ambulatory Visit (INDEPENDENT_AMBULATORY_CARE_PROVIDER_SITE_OTHER): Payer: Medicare Other | Admitting: Nurse Practitioner

## 2014-10-25 VITALS — BP 123/63 | HR 62 | Temp 96.7°F | Ht 61.0 in | Wt 132.0 lb

## 2014-10-25 DIAGNOSIS — G459 Transient cerebral ischemic attack, unspecified: Secondary | ICD-10-CM

## 2014-10-25 NOTE — Progress Notes (Signed)
PATIENT: [name] DOB: [birth year]  REASON FOR VISIT: routine follow up for TIA HISTORY FROM: patient  HISTORY OF PRESENT ILLNESS: [name] is a 24 year pleasant Caucasian lady who had 2 episodes of transient left-sided numbness weakness and hand tremor on 07/30/2014 and 07/31/2014. She develop shaking of the left hand followed by left leg and strange and numb sensation lasting 5 minutes. She did not seek immediate attention but next day and she had a similar episode she mentioned it to her daughter who is a Librarian, academic was upset and asked her to go to the hospital. She is admitted at [facility] overnight and underwent MRI scan of the brain which I personally reviewed shows no acute infarct but shows changes of chronic small vessel disease. Carotid ultrasound showed no significant extracranial stenosis. Transthoracic echo showed normal ejection fraction without obvious cardiac source of embolism. Hemoglobin A1c was normal and lipid profile was elevated with total cholesterol 236 and LDL of 132. Patient had vascular risk factors of hypertension, hyperlipidemia and coronary artery disease. She was supposed to be on Plavix but was not taking it regularly. She is advised to take it now on a daily basis and started on statin for her lipids. She previously had trouble tolerating Crestor in the past and hence was put on pitavastatin this time. She states is tolerating both medications well without significant bleeding bruising or muscle aches or pain yet. She is independent and manages her own affairs. She has chronic history of headaches and states that she had seen Dr. Erling Cruz in the past and in fact underwent temporal artery biopsy which was negative. She has some occasional flashing lights followed by headaches and usually takes Tylenol which works quite well for her. She also has tightness and tension headaches in the back of her head off and on.   10/25/14 Update (LL): Since  last visit 2 months ago, patient has been well without recurrent TIA symptoms. She has not been bothered by headaches in some time. She reports that her blood pressure is well controlled, it is 123/63 in the office today. She is tolerating Plavix well with no signs of significant bleeding or bruising. She is still taking pitavastatin without complications. Last LDL checkied in August was 121. She has no complaints today.  ROS:  14 system review of systems is positive for nothing today. All systems negative.   ALLERGIES: Allergies  Allergen Reactions  . Aspirin Other (See Comments)    Feels like choking  . Bactrim Other (See Comments)    Unknown  . Crestor [Rosuvastatin] Other (See Comments)    Lower ext myalgia  . Niacin And Related Other (See Comments)    Turns red in the face  . Penicillins Other (See Comments)    Unknown  . Relafen [Nabumetone] Other (See Comments)    Unknown  . Tetracyclines & Related Rash    HOME MEDICATIONS: Outpatient Prescriptions Prior to Visit  Medication Sig Dispense Refill  . Calcium Carbonate-Vitamin D (CALCIUM + D PO) Take 1 tablet by mouth every morning.       . Carboxymethylcellul-Glycerin (LUBRICATING EYE DROPS OP) Place 1 drop into both eyes 2 (two) times daily.      . cholecalciferol (VITAMIN D) 1000 UNITS tablet Take 1,000 Units by mouth daily.      . clopidogrel (PLAVIX) 75 MG tablet Take 1 tablet (75 mg total) by mouth daily.  90 tablet  1  . ESTRACE VAGINAL 0.1 MG/GM  vaginal cream Place 1 Applicatorful vaginally once a week.       Marland Kitchen LORazepam (ATIVAN) 0.5 MG tablet Take 1 tablet (0.5 mg total) by mouth at bedtime as needed. For sleep.  30 tablet  2  . metoprolol succinate (TOPROL-XL) 25 MG 24 hr tablet Take 0.5 tablets (12.5 mg total) by mouth daily.  90 tablet  1  . Multiple Vitamins-Minerals (MULTIVITAMIN PO) Take 1 tablet by mouth daily.       . pantoprazole (PROTONIX) 40 MG tablet TAKE 1 TABLET AT NIGHT.  90 tablet  1  . Pitavastatin  Calcium 1 MG TABS Take one tablet every evening  90 tablet  1  . ranitidine (ZANTAC) 150 MG tablet TAKE 1 TABLET TWICE DAILY.  180 tablet  1  . clindamycin (CLEOCIN) 150 MG capsule Take 1 capsule (150 mg total) by mouth 3 (three) times daily.  30 capsule  0   No facility-administered medications prior to visit.    PHYSICAL EXAM Filed Vitals:   10/25/14 1502  BP: 123/63  Pulse: 62  Temp: 96.7 F (35.9 C)  TempSrc: Oral  Height: 5\' 1"  (1.549 m)  Weight: 132 lb (59.875 kg)   Body mass index is 24.95 kg/(m^2).  Physical Exam  General: petite frail elderly lady not in distress, seated  Head: head normocephalic and atraumatic.  Neck: supple with no carotid or supraclavicular bruits  Cardiovascular: regular rate and rhythm, no murmurs  Musculoskeletal: no deformity  Skin: no rash/petichiae  Vascular: Normal pulses all extremities   Neurologic Exam  Mental Status: Awake and fully alert. Oriented to place and time. Recent and remote memory intact. Attention span, concentration and fund of knowledge appropriate. Mood and affect appropriate.  Cranial Nerves: Fundoscopic exam reveals sharp disc margins. Pupils equal, briskly reactive to light. Extraocular movements full without nystagmus. Visual fields full to confrontation. Hearing intact. Facial sensation intact. Face, tongue, palate moves normally and symmetrically.  Motor: Normal bulk and tone. Normal strength in all tested extremity muscles.  Sensory.: intact to touch and pinprick and vibratory.  Coordination: Rapid alternating movements normal in all extremities. Finger-to-nose and heel-to-shin performed accurately bilaterally.  Gait and Station: Arises from chair without difficulty. Stance is stooped slightlyl. Gait demonstrates normal stride length and balance . Unable to heel, toe and tandem walk without difficulty.  Reflexes: 1+ and symmetric. Toes downgoing.   DIAGNOSTIC DATA (LABS, IMAGING, TESTING) - I reviewed patient  records, labs, notes, testing and imaging myself where available.  Lab Results  Component Value Date   WBC 5.6 07/30/2014   HGB 13.4 07/30/2014   HCT 41.1 07/30/2014   MCV 93.4 07/30/2014   PLT 217 07/30/2014      Component Value Date/Time   NA 141 07/30/2014 1111   K 4.3 07/30/2014 1111   CL 102 07/30/2014 1111   CO2 28 07/30/2014 1111   GLUCOSE 90 07/30/2014 1111   BUN 13 07/30/2014 1111   CREATININE 0.65 07/30/2014 1434   CREATININE 0.62 07/29/2014 1022   CALCIUM 9.7 07/30/2014 1111   PROT 6.4 07/30/2014 1111   ALBUMIN 3.4* 07/30/2014 1111   AST 21 07/30/2014 1111   ALT 14 07/30/2014 1111   ALKPHOS 46 07/30/2014 1111   BILITOT 0.4 07/30/2014 1111   GFRNONAA 78* 07/30/2014 1434   GFRAA >90 07/30/2014 1434   Lab Results  Component Value Date   CHOL 211* 08/24/2014   HDL 61 08/24/2014   LDLCALC 121* 08/24/2014   TRIG 147 08/24/2014   CHOLHDL 3.5  08/24/2014   Lab Results  Component Value Date   HGBA1C 6.1* 07/31/2014     ASSESSMENT: 42 year Caucasian lady with 2 episodes of right hemispheric TIAs in summer 2015 likely due to small vessel disease with multiple vascular risk factors of age, sex, hyperlipidemia, hypertension and coronary artery disease. Long-standing mixed migraine and tension headaches which appear stable.   PLAN:  I had a long discussion with the patient with regards to her TIAs and discussed risk for recurrence of stroke and TIA and answered questions. Continue Plavix for secondary prevention and maintain strict control of lipids with LDL cholesterol goal below 100 mg percent. She is advised to do regular neck stretching exercises for her tension headaches. She was advised to return for followup in 6 months or call earlier if necessary.   Rudi Rummage Emil Weigold, MSN, FNP-BC, A/GNP-C 10/25/2014, 3:33 PM [facility] [address], [place], [place] [address] [phone]  Note: This document was prepared with digital dictation and possible smart phrase  technology. Any transcriptional errors that result from this process are unintentional.

## 2014-10-25 NOTE — Patient Instructions (Addendum)
Continue Plavix for secondary prevention and maintain strict control of lipids with LDL cholesterol goal below 100 mg percent. She is advised to do regular neck stretching exercises for her tension headaches. She was advised to return for followup in 6 months or call earlier if necessary.     Transient Ischemic Attack A transient ischemic attack (TIA) is a "warning stroke" that causes stroke-like symptoms. Unlike a stroke, a TIA does not cause permanent damage to the brain. The symptoms of a TIA can happen very fast and do not last long. It is important to know the symptoms of a TIA and what to do. This can help prevent a major stroke or death. CAUSES   A TIA is caused by a temporary blockage in an artery in the brain or neck (carotid artery). The blockage does not allow the brain to get the blood supply it needs and can cause different symptoms. The blockage can be caused by either:  A blood clot.  Fatty buildup (plaque) in a neck or brain artery. RISK FACTORS  High blood pressure (hypertension).  High cholesterol.  Diabetes mellitus.  Heart disease.  The build up of plaque in the blood vessels (peripheral artery disease or atherosclerosis).  The build up of plaque in the blood vessels providing blood and oxygen to the brain (carotid artery stenosis).  An abnormal heart rhythm (atrial fibrillation).  Obesity.  Smoking.  Taking oral contraceptives (especially in combination with smoking).  Physical inactivity.  A diet high in fats, salt (sodium), and calories.  Alcohol use.  Use of illegal drugs (especially cocaine and methamphetamine).  Being female.  Being African American.  Being over the age of 63.  Family history of stroke.  Previous history of blood clots, stroke, TIA, or heart attack.  Sickle cell disease. SYMPTOMS  TIA symptoms are the same as a stroke but are temporary. These symptoms usually develop suddenly, or may be newly present upon awakening from  sleep:  Sudden weakness or numbness of the face, arm, or leg, especially on one side of the body.  Sudden trouble walking or difficulty moving arms or legs.  Sudden confusion.  Sudden personality changes.  Trouble speaking (aphasia) or understanding.  Difficulty swallowing.  Sudden trouble seeing in one or both eyes.  Double vision.  Dizziness.  Loss of balance or coordination.  Sudden severe headache with no known cause.  Trouble reading or writing.  Loss of bowel or bladder control.  Loss of consciousness. DIAGNOSIS  Your caregiver may be able to determine the presence or absence of a TIA based on your symptoms, history, and physical exam. Computed tomography (CT scan) of the brain is usually performed to help identify a TIA. Other tests may be done to diagnose a TIA. These tests may include:  Electrocardiography.  Continuous heart monitoring.  Echocardiography.  Carotid ultrasonography.  Magnetic resonance imaging (MRI).  A scan of the brain circulation.  Blood tests. PREVENTION  The risk of a TIA can be decreased by appropriately treating high blood pressure, high cholesterol, diabetes, heart disease, and obesity and by quitting smoking, limiting alcohol, and staying physically active. TREATMENT  Time is of the essence. Since the symptoms of TIA are the same as a stroke, it is important to seek treatment as soon as possible because you may need a medicine to dissolve the clot (thrombolytic) that cannot be given if too much time has passed. Treatment options vary. Treatment options may include rest, oxygen, intravenous (IV) fluids, and medicines to thin  the blood (anticoagulants). Medicines and diet may be used to address diabetes, high blood pressure, and other risk factors. Measures will be taken to prevent short-term and long-term complications, including infection from breathing foreign material into the lungs (aspiration pneumonia), blood clots in the legs, and  falls. Treatment options include procedures to either remove plaque in the carotid arteries or dilate carotid arteries that have narrowed due to plaque. Those procedures are:  Carotid endarterectomy.  Carotid angioplasty and stenting. HOME CARE INSTRUCTIONS   Take all medicines prescribed by your caregiver. Follow the directions carefully. Medicines may be used to control risk factors for a stroke. Be sure you understand all your medicine instructions.  You may be told to take aspirin or the anticoagulant warfarin. Warfarin needs to be taken exactly as instructed.  Taking too much or too little warfarin is dangerous. Too much warfarin increases the risk of bleeding. Too little warfarin continues to allow the risk for blood clots. While taking warfarin, you will need to have regular blood tests to measure your blood clotting time. A PT blood test measures how long it takes for blood to clot. Your PT is used to calculate another value called an INR. Your PT and INR help your caregiver to adjust your dose of warfarin. The dose can change for many reasons. It is critically important that you take warfarin exactly as prescribed.  Many foods, especially foods high in vitamin K can interfere with warfarin and affect the PT and INR. Foods high in vitamin K include spinach, kale, broccoli, cabbage, collard and turnip greens, brussels sprouts, peas, cauliflower, seaweed, and parsley as well as beef and pork liver, green tea, and soybean oil. You should eat a consistent amount of foods high in vitamin K. Avoid major changes in your diet, or notify your caregiver before changing your diet. Arrange a visit with a dietitian to answer your questions.  Many medicines can interfere with warfarin and affect the PT and INR. You must tell your caregiver about any and all medicines you take, this includes all vitamins and supplements. Be especially cautious with aspirin and anti-inflammatory medicines. Do not take or  discontinue any prescribed or over-the-counter medicine except on the advice of your caregiver or pharmacist.  Warfarin can have side effects, such as excessive bruising or bleeding. You will need to hold pressure over cuts for longer than usual. Your caregiver or pharmacist will discuss other potential side effects.  Avoid sports or activities that may cause injury or bleeding.  Be mindful when shaving, flossing your teeth, or handling sharp objects.  Alcohol can change the body's ability to handle warfarin. It is best to avoid alcoholic drinks or consume only very small amounts while taking warfarin. Notify your caregiver if you change your alcohol intake.  Notify your dentist or other caregivers before procedures.  Eat a diet that includes 5 or more servings of fruits and vegetables each day. This may reduce the risk of stroke. Certain diets may be prescribed to address high blood pressure, high cholesterol, diabetes, or obesity.  A low-sodium, low-saturated fat, low-trans fat, low-cholesterol diet is recommended to manage high blood pressure.  A low-saturated fat, low-trans fat, low-cholesterol, and high-fiber diet may control cholesterol levels.  A controlled-carbohydrate, controlled-sugar diet is recommended to manage diabetes.  A reduced-calorie, low-sodium, low-saturated fat, low-trans fat, low-cholesterol diet is recommended to manage obesity.  Maintain a healthy weight.  Stay physically active. It is recommended that you get at least 30 minutes of activity on  most or all days.  Do not smoke.  Limit alcohol use even if you are not taking warfarin. Moderate alcohol use is considered to be:  No more than 2 drinks each day for men.  No more than 1 drink each day for nonpregnant women.  Stop drug abuse.  Home safety. A safe home environment is important to reduce the risk of falls. Your caregiver may arrange for specialists to evaluate your home. Having grab bars in the  bedroom and bathroom is often important. Your caregiver may arrange for equipment to be used at home, such as raised toilets and a seat for the shower.  Follow all instructions for follow-up with your caregiver. This is very important. This includes any referrals and lab tests. Proper follow up can prevent a stroke or another TIA from occurring. SEEK MEDICAL CARE IF:  You have personality changes.  You have difficulty swallowing.  You are seeing double.  You have dizziness.  You have a fever.  You have skin breakdown. SEEK IMMEDIATE MEDICAL CARE IF:  Any of these symptoms may represent a serious problem that is an emergency. Do not wait to see if the symptoms will go away. Get medical help right away. Call your local emergency services (911 in [place] Do not drive yourself to the hospital.  You have sudden weakness or numbness of the face, arm, or leg, especially on one side of the body.  You have sudden trouble walking or difficulty moving arms or legs.  You have sudden confusion.  You have trouble speaking (aphasia) or understanding.  You have sudden trouble seeing in one or both eyes.  You have a loss of balance or coordination.  You have a sudden, severe headache with no known cause.  You have new chest pain or an irregular heartbeat.  You have a partial or total loss of consciousness. MAKE SURE YOU:   Understand these instructions.  Will watch your condition.  Will get help right away if you are not doing well or get worse. Document Released: 09/26/2005 Document Revised: 12/22/2013 Document Reviewed: 03/24/2014 [facility] Patient Information 2015 [place], [place]. This information is not intended to replace advice given to you by your health care provider. Make sure you discuss any questions you have with your health care provider.

## 2014-10-26 NOTE — Progress Notes (Signed)
I agree with the above plan 

## 2014-11-05 ENCOUNTER — Ambulatory Visit (INDEPENDENT_AMBULATORY_CARE_PROVIDER_SITE_OTHER): Payer: Medicare Other | Admitting: Emergency Medicine

## 2014-11-05 VITALS — BP 132/66 | HR 68 | Temp 98.4°F | Resp 16 | Ht 61.5 in | Wt 130.0 lb

## 2014-11-05 DIAGNOSIS — L03031 Cellulitis of right toe: Secondary | ICD-10-CM

## 2014-11-05 NOTE — Progress Notes (Signed)
Subjective:    Patient ID: [name], female    DOB: 26-Jun-1926, 80 y.o.   MRN: [id number] This chart was scribed for [place]. Everlene Farrier, MD by Steva Colder, ED Scribe. The patient was seen in room 11 at 2:04 PM.   Chief Complaint  Patient presents with  . Toe Pain    redness rt great toe will not heal     HPI [name] is a 80 y.o. female with a hx of HTN, CAD, and CP who presents today complaining of right toe pain. She states that her big toe of her right foot will not heal. She states that it hurts at night and it hurts when she walks on it. She states that she walks around her house barefoot because that is the most comfortable. She denies any other symptoms. She denies any loss of feeling in the right foot.   Patient Active Problem List   Diagnosis Date Noted  . Variants of migraine, not elsewhere classified, without mention of intractable migraine without mention of status migrainosus 08/17/2014  . Tension headache 08/17/2014  . TIA (transient ischemic attack) 07/30/2014  . HTN (hypertension) 07/30/2014  . Aortic aneurysm, thoracic 06/25/2014  . Chest pain 07/29/2012  . CAD (coronary artery disease) 06/03/2012  . Hypercholesteremia   . GERD (gastroesophageal reflux disease)    Past Medical History  Diagnosis Date  . CAD (coronary artery disease)     a. Nonobstructive by last cath in 05/2004 with 30% LAD. b. EF 65-70% 12/2010. c. Hx ASA intolerance.   . Hypercholesteremia     Hx of myalgias felt to be statin related but taking Crestor as OP  . GERD (gastroesophageal reflux disease)     a. Minimal hiatal hernia on EGD 2006, no obvious esophageal source for CP at that time.  . Hypertension   . Osteoporosis   . Aortic dilatation     Seen on cath 2005  . Colitis     12/2010 associated with transient lower GI bleeding/anemia tx with abx  . Arthritis   . Aortic aneurysm, thoracic     4.4 cm   Past Surgical History  Procedure Laterality Date  . Back  surgery      Disc disease  . Hysterectomy -- unknown type    . Shunt for syringomyelia 1987    . Temporal artery biopsy / ligation      Negative 2005  . Shoulder surgery    . Abdominal hysterectomy     Allergies  Allergen Reactions  . Aspirin Other (See Comments)    Feels like choking  . Bactrim Other (See Comments)    Unknown  . Crestor [Rosuvastatin] Other (See Comments)    Lower ext myalgia  . Niacin And Related Other (See Comments)    Turns red in the face  . Penicillins Other (See Comments)    Unknown  . Relafen [Nabumetone] Other (See Comments)    Unknown  . Tetracyclines & Related Rash   Prior to Admission medications   Medication Sig Start Date End Date Taking? Authorizing Provider  Calcium Carbonate-Vitamin D (CALCIUM + D PO) Take 1 tablet by mouth every morning.     Historical Provider, MD  Carboxymethylcellul-Glycerin (LUBRICATING EYE DROPS OP) Place 1 drop into both eyes 2 (two) times daily.    Historical Provider, MD  cholecalciferol (VITAMIN D) 1000 UNITS tablet Take 1,000 Units by mouth daily.    Historical Provider, MD  clopidogrel (PLAVIX) 75 MG tablet Take  1 tablet (75 mg total) by mouth daily. 08/24/14   Darlyne Russian, MD  ESTRACE VAGINAL 0.1 MG/GM vaginal cream Place 1 Applicatorful vaginally once a week.  05/13/14   Historical Provider, MD  LORazepam (ATIVAN) 0.5 MG tablet Take 1 tablet (0.5 mg total) by mouth at bedtime as needed. For sleep. 08/24/14   Darlyne Russian, MD  metoprolol succinate (TOPROL-XL) 25 MG 24 hr tablet Take 0.5 tablets (12.5 mg total) by mouth daily. 08/24/14   Darlyne Russian, MD  Multiple Vitamins-Minerals (MULTIVITAMIN PO) Take 1 tablet by mouth daily.     Historical Provider, MD  pantoprazole (PROTONIX) 40 MG tablet TAKE 1 TABLET AT NIGHT. 08/24/14   Darlyne Russian, MD  Pitavastatin Calcium 1 MG TABS Take one tablet every evening 08/24/14   Darlyne Russian, MD  ranitidine (ZANTAC) 150 MG tablet TAKE 1 TABLET TWICE DAILY. 08/24/14   Darlyne Russian,  MD      Review of Systems  Musculoskeletal:       Pain in the right great toe due to nail growing  All other systems reviewed and are negative.      Objective:   Physical Exam  Constitutional: She is oriented to person, place, and time. She appears well-developed and well-nourished. No distress.  HENT:  Head: Normocephalic and atraumatic.  Eyes: EOM are normal.  Neck: Neck supple. No tracheal deviation present.  Cardiovascular: Normal rate.   Pulmonary/Chest: Effort normal. No respiratory distress.  Musculoskeletal: Normal range of motion.  Neurological: She is alert and oriented to person, place, and time.  Skin: Skin is warm and dry.  Psychiatric: She has a normal mood and affect. Her behavior is normal.  Nursing note and vitals reviewed. Great toe there is a new nail forming on the great toe. There is no redness no swelling.      BP 132/66 mmHg  Pulse 68  Temp(Src) 98.4 F (36.9 C) (Oral)  Resp 16  Ht 5' 1.5" (1.562 m)  Wt 130 lb (58.968 kg)  BMI 24.17 kg/m2  SpO2 96% Assessment & Plan:  DIAGNOSTIC STUDIES: Oxygen Saturation is 96% on room air, normal by my interpretation.    COORDINATION OF CARE: 2:09 PM-Discussed treatment plan with pt at bedside and pt agreed to plan.  I told her it would be best to leave the toenail alone. This would be safer.  I personally performed the services described in this documentation, which was scribed in my presence. The recorded information has been reviewed and is accurate.

## 2015-02-10 ENCOUNTER — Ambulatory Visit (INDEPENDENT_AMBULATORY_CARE_PROVIDER_SITE_OTHER): Payer: PPO | Admitting: Family Medicine

## 2015-02-10 ENCOUNTER — Encounter: Payer: Self-pay | Admitting: Family Medicine

## 2015-02-10 VITALS — BP 120/64 | HR 68 | Temp 97.8°F | Resp 18 | Ht 61.25 in | Wt 129.8 lb

## 2015-02-10 DIAGNOSIS — H6122 Impacted cerumen, left ear: Secondary | ICD-10-CM

## 2015-02-10 DIAGNOSIS — E785 Hyperlipidemia, unspecified: Secondary | ICD-10-CM

## 2015-02-10 LAB — LIPID PANEL
Cholesterol: 199 mg/dL (ref 0–200)
HDL: 63 mg/dL (ref >39)
LDL CALC: 110 mg/dL — AB (ref 0–99)
Total CHOL/HDL Ratio: 3.2 Ratio
Triglycerides: 129 mg/dL (ref <150)
VLDL: 26 mg/dL (ref 0–40)

## 2015-02-10 NOTE — Patient Instructions (Signed)
I will be in touch with your cholesterol results.

## 2015-02-10 NOTE — Progress Notes (Signed)
[facility] [address] [place] [address] [phone]  Date:  02/10/2015   Name:  [name]   DOB:  25-Jul-1926   MRN:  [id number]  PCP:  Jenny Reichmann, MD    Chief Complaint: Ear Fullness and Lab Work   History of Present Illness:  [name] is a 81 y.o. very pleasant female patient who presents with the following:  Her left ear is bothersome- it does not hurt but it seems to be "closed."  It has bothered her for 2-3 days.  She has tried osme wax removers at home.    She also would like to check her lipids as this has not been done in a little while.  She did eat a yogurt and a few chips about 3 hours ago.  She changed her cholesterol medication last summer  Patient Active Problem List   Diagnosis Date Noted  . Variants of migraine, not elsewhere classified, without mention of intractable migraine without mention of status migrainosus 08/17/2014  . Tension headache 08/17/2014  . TIA (transient ischemic attack) 07/30/2014  . HTN (hypertension) 07/30/2014  . Aortic aneurysm, thoracic 06/25/2014  . Chest pain 07/29/2012  . CAD (coronary artery disease) 06/03/2012  . Hypercholesteremia   . GERD (gastroesophageal reflux disease)     Past Medical History  Diagnosis Date  . CAD (coronary artery disease)     a. Nonobstructive by last cath in 05/2004 with 30% LAD. b. EF 65-70% 12/2010. c. Hx ASA intolerance.   . Hypercholesteremia     Hx of myalgias felt to be statin related but taking Crestor as OP  . GERD (gastroesophageal reflux disease)     a. Minimal hiatal hernia on EGD 2006, no obvious esophageal source for CP at that time.  . Hypertension   . Osteoporosis   . Aortic dilatation     Seen on cath 2005  . Colitis     12/2010 associated with transient lower GI bleeding/anemia tx with abx  . Arthritis   . Aortic aneurysm, thoracic     4.4 cm    Past Surgical History  Procedure Laterality Date  . Back surgery      Disc  disease  . Hysterectomy -- unknown type    . Shunt for syringomyelia 1987    . Temporal artery biopsy / ligation      Negative 2005  . Shoulder surgery    . Abdominal hysterectomy      History  Substance Use Topics  . Smoking status: Former Research scientist (life sciences)  . Smokeless tobacco: Not on file  . Alcohol Use: No    Family History  Problem Relation Age of Onset  . Heart disease    . Heart failure    . Heart disease Mother   . Heart failure Mother   . Arthritis Mother   . Heart disease Brother     Allergies  Allergen Reactions  . Aspirin Other (See Comments)    Feels like choking  . Bactrim Other (See Comments)    Unknown  . Crestor [Rosuvastatin] Other (See Comments)    Lower ext myalgia  . Niacin And Related Other (See Comments)    Turns red in the face  . Penicillins Other (See Comments)    Unknown  . Relafen [Nabumetone] Other (See Comments)    Unknown  . Tetracyclines & Related Rash    Medication list has been reviewed and updated.  Current Outpatient Prescriptions on File Prior  to Visit  Medication Sig Dispense Refill  . Calcium Carbonate-Vitamin D (CALCIUM + D PO) Take 1 tablet by mouth every morning.     . Carboxymethylcellul-Glycerin (LUBRICATING EYE DROPS OP) Place 1 drop into both eyes 2 (two) times daily.    . cholecalciferol (VITAMIN D) 1000 UNITS tablet Take 1,000 Units by mouth daily.    . clopidogrel (PLAVIX) 75 MG tablet Take 1 tablet (75 mg total) by mouth daily. 90 tablet 1  . ESTRACE VAGINAL 0.1 MG/GM vaginal cream Place 1 Applicatorful vaginally once a week.     Marland Kitchen LORazepam (ATIVAN) 0.5 MG tablet Take 1 tablet (0.5 mg total) by mouth at bedtime as needed. For sleep. 30 tablet 2  . metoprolol succinate (TOPROL-XL) 25 MG 24 hr tablet Take 0.5 tablets (12.5 mg total) by mouth daily. 90 tablet 1  . Multiple Vitamins-Minerals (MULTIVITAMIN PO) Take 1 tablet by mouth daily.     . pantoprazole (PROTONIX) 40 MG tablet TAKE 1 TABLET AT NIGHT. 90 tablet 1  .  Pitavastatin Calcium 1 MG TABS Take one tablet every evening 90 tablet 1  . ranitidine (ZANTAC) 150 MG tablet TAKE 1 TABLET TWICE DAILY. 180 tablet 1   No current facility-administered medications on file prior to visit.    Review of Systems:  As per HPI- otherwise negative.   Physical Examination: Filed Vitals:   02/10/15 1450  BP: 120/64  Pulse: 68  Temp: 97.8 F (36.6 C)  Resp: 18   Filed Vitals:   02/10/15 1450  Height: 5' 1.25" (1.556 m)  Weight: 129 lb 12.8 oz (58.877 kg)   Body mass index is 24.32 kg/(m^2). Ideal Body Weight: Weight in (lb) to have BMI = 25: 133.1  GEN: WDWN, NAD, Non-toxic, A & O x 3 HEENT: Atraumatic, Normocephalic. Neck supple. No masses, No LAD.  Left ear canal is occluded with ear wax, right TM wnl.  Ears and Nose: No external deformity. CV: RRR, No M/G/R. No JVD. No thrill. No extra heart sounds. PULM: CTA B, no wheezes, crackles, rhonchi. No retractions. No resp. distress. No accessory muscle use. EXTR: No c/c/e NEURO Normal gait.  PSYCH: Normally interactive. Conversant. Not depressed or anxious appearing.  Calm demeanor.   Ear irrigated with water and cerumen removed.  Normal TM and ear canal Assessment and Plan: Cerumen impaction, left  Hyperlipidemia - Plan: Lipid panel  Ear wax removed as above, check lipids and will follow-up with her asap  Signed Lamar Blinks, MD

## 2015-03-07 ENCOUNTER — Other Ambulatory Visit: Payer: Self-pay | Admitting: Emergency Medicine

## 2015-03-07 ENCOUNTER — Ambulatory Visit (INDEPENDENT_AMBULATORY_CARE_PROVIDER_SITE_OTHER): Payer: PPO | Admitting: Emergency Medicine

## 2015-03-07 ENCOUNTER — Ambulatory Visit (INDEPENDENT_AMBULATORY_CARE_PROVIDER_SITE_OTHER): Payer: PPO

## 2015-03-07 VITALS — BP 118/58 | HR 76 | Temp 97.7°F | Resp 16 | Ht 61.0 in | Wt 130.0 lb

## 2015-03-07 DIAGNOSIS — R350 Frequency of micturition: Secondary | ICD-10-CM

## 2015-03-07 DIAGNOSIS — R109 Unspecified abdominal pain: Secondary | ICD-10-CM | POA: Diagnosis not present

## 2015-03-07 LAB — POCT CBC
Granulocyte percent: 72.8 %G (ref 37–80)
HCT, POC: 45 % (ref 37.7–47.9)
Hemoglobin: 14.6 g/dL (ref 12.2–16.2)
Lymph, poc: 1.5 (ref 0.6–3.4)
MCH, POC: 30.4 pg (ref 27–31.2)
MCHC: 32.5 g/dL (ref 31.8–35.4)
MCV: 93.3 fL (ref 80–97)
MID (cbc): 1 — AB (ref 0–0.9)
MPV: 8.1 fL (ref 0–99.8)
PLATELET COUNT, POC: 254 10*3/uL (ref 142–424)
POC GRANULOCYTE: 6.6 (ref 2–6.9)
POC LYMPH %: 16.3 % (ref 10–50)
POC MID %: 10.9 % (ref 0–12)
RBC: 4.82 M/uL (ref 4.04–5.48)
RDW, POC: 14.1 %
WBC: 9 10*3/uL (ref 4.6–10.2)

## 2015-03-07 LAB — POCT URINALYSIS DIPSTICK
BILIRUBIN UA: NEGATIVE
GLUCOSE UA: NEGATIVE
KETONES UA: NEGATIVE
LEUKOCYTES UA: NEGATIVE
Nitrite, UA: NEGATIVE
PROTEIN UA: NEGATIVE
Spec Grav, UA: 1.015
Urobilinogen, UA: 0.2
pH, UA: 6

## 2015-03-07 LAB — POCT UA - MICROSCOPIC ONLY
Bacteria, U Microscopic: NEGATIVE
Casts, Ur, LPF, POC: NEGATIVE
Crystals, Ur, HPF, POC: NEGATIVE
Epithelial cells, urine per micros: NEGATIVE
Mucus, UA: NEGATIVE
WBC, Ur, HPF, POC: NEGATIVE
Yeast, UA: NEGATIVE

## 2015-03-07 MED ORDER — POLYETHYLENE GLYCOL 3350 17 GM/SCOOP PO POWD: 17.0000 g | Freq: Two times a day (BID) | ORAL | Status: DC | PRN

## 2015-03-07 NOTE — Patient Instructions (Signed)

## 2015-03-07 NOTE — Progress Notes (Signed)
[facility] [address], [place] [address] [phone]  Date:  03/07/2015   Name:  [name]   DOB:  August 22, 1926   MRN:  [id number]  PCP:  Jenny Reichmann, MD    Chief Complaint: Urinary Frequency   History of Present Illness:  [name] is a 81 y.o. very pleasant female patient who presents with the following:  Patient with history of recurrent UTI's now to the office with dysuria, nocturia, and incontinence No peripheral edema.  No fever or chills. Says dysuria and intermittent pelvic pain for past 10 days. Pain is intermittent and the is no provocative or amelioraing factors. No stool change.  The patient has no complaint of blood, mucous, or pus in her stools. No nausea or vomiting.   Normal appetite. No improvement with over the counter medications or other home remedies. Denies other complaint or health concern today.   Patient Active Problem List   Diagnosis Date Noted  . Variants of migraine, not elsewhere classified, without mention of intractable migraine without mention of status migrainosus 08/17/2014  . Tension headache 08/17/2014  . TIA (transient ischemic attack) 07/30/2014  . HTN (hypertension) 07/30/2014  . Aortic aneurysm, thoracic 06/25/2014  . Chest pain 07/29/2012  . CAD (coronary artery disease) 06/03/2012  . Hypercholesteremia   . GERD (gastroesophageal reflux disease)     Past Medical History  Diagnosis Date  . CAD (coronary artery disease)     a. Nonobstructive by last cath in 05/2004 with 30% LAD. b. EF 65-70% 12/2010. c. Hx ASA intolerance.   . Hypercholesteremia     Hx of myalgias felt to be statin related but taking Crestor as OP  . GERD (gastroesophageal reflux disease)     a. Minimal hiatal hernia on EGD 2006, no obvious esophageal source for CP at that time.  . Hypertension   . Osteoporosis   . Aortic dilatation     Seen on cath 2005  . Colitis     12/2010 associated with transient lower GI  bleeding/anemia tx with abx  . Arthritis   . Aortic aneurysm, thoracic     4.4 cm    Past Surgical History  Procedure Laterality Date  . Back surgery      Disc disease  . Hysterectomy -- unknown type    . Shunt for syringomyelia 1987    . Temporal artery biopsy / ligation      Negative 2005  . Shoulder surgery    . Abdominal hysterectomy      History  Substance Use Topics  . Smoking status: Former Research scientist (life sciences)  . Smokeless tobacco: Not on file  . Alcohol Use: No    Family History  Problem Relation Age of Onset  . Heart disease    . Heart failure    . Heart disease Mother   . Heart failure Mother   . Arthritis Mother   . Heart disease Brother     Allergies  Allergen Reactions  . Aspirin Other (See Comments)    Feels like choking  . Bactrim Other (See Comments)    Unknown  . Crestor [Rosuvastatin] Other (See Comments)    Lower ext myalgia  . Niacin And Related Other (See Comments)    Turns red in the face  . Penicillins Other (See Comments)    Unknown  . Relafen [Nabumetone] Other (See Comments)    Unknown  . Tetracyclines & Related Rash    Medication list has been  reviewed and updated.  Current Outpatient Prescriptions on File Prior to Visit  Medication Sig Dispense Refill  . Calcium Carbonate-Vitamin D (CALCIUM + D PO) Take 1 tablet by mouth every morning.     . Carboxymethylcellul-Glycerin (LUBRICATING EYE DROPS OP) Place 1 drop into both eyes 2 (two) times daily.    . cholecalciferol (VITAMIN D) 1000 UNITS tablet Take 1,000 Units by mouth daily.    . clopidogrel (PLAVIX) 75 MG tablet Take 1 tablet (75 mg total) by mouth daily. PATIENT NEEDS CHECK UP FOR ADDITIONAL REFILLS 30 tablet 0  . ESTRACE VAGINAL 0.1 MG/GM vaginal cream Place 1 Applicatorful vaginally once a week.     Marland Kitchen LORazepam (ATIVAN) 0.5 MG tablet Take 1 tablet (0.5 mg total) by mouth at bedtime as needed. For sleep. 30 tablet 2  . metoprolol succinate (TOPROL-XL) 25 MG 24 hr tablet Take 0.5  tablets (12.5 mg total) by mouth daily. 90 tablet 1  . Multiple Vitamins-Minerals (MULTIVITAMIN PO) Take 1 tablet by mouth daily.     . pantoprazole (PROTONIX) 40 MG tablet TAKE 1 TABLET AT NIGHT. 90 tablet 1  . Pitavastatin Calcium 1 MG TABS Take one tablet every evening 90 tablet 1  . ranitidine (ZANTAC) 150 MG tablet TAKE 1 TABLET TWICE DAILY. 180 tablet 1   No current facility-administered medications on file prior to visit.    Review of Systems:  As per HPI, otherwise negative.    Physical Examination: Filed Vitals:   03/07/15 1455  BP: 118/58  Pulse: 76  Temp: 97.7 F (36.5 C)  Resp: 16   Filed Vitals:   03/07/15 1455  Height: 5\' 1"  (1.549 m)  Weight: 130 lb (58.968 kg)   Body mass index is 24.58 kg/(m^2). Ideal Body Weight: Weight in (lb) to have BMI = 25: 132  GEN: WDWN, NAD, Non-toxic, A & O x 3 HEENT: Atraumatic, Normocephalic. Neck supple. No masses, No LAD. Ears and Nose: No external deformity. CV: RRR, No M/G/R. No JVD. No thrill. No extra heart sounds. PULM: CTA B, no wheezes, crackles, rhonchi. No retractions. No resp. distress. No accessory muscle use. ABD: S, tender left abdomen, ND, +BS. No rebound. No HSM. EXTR: No c/c/e NEURO Normal gait.  PSYCH: Normally interactive. Conversant. Not depressed or anxious appearing.  Calm demeanor.    Assessment and Plan: Dysuria Urology Abdominal pain  Signed,  Ellison Carwin, MD  [facility] reading (PRIMARY) by  Dr. Ouida Sills.  Negative but constipated. r   Results for orders placed or performed in visit on 03/07/15  POCT urinalysis dipstick  Result Value Ref Range   Color, UA yellow    Clarity, UA clear    Glucose, UA neg    Bilirubin, UA neg    Ketones, UA neg    Spec Grav, UA 1.015    Blood, UA trace    pH, UA 6.0    Protein, UA neg    Urobilinogen, UA 0.2    Nitrite, UA neg    Leukocytes, UA Negative   POCT UA - Microscopic Only  Result Value Ref Range   WBC, Ur, HPF, POC neg    RBC, urine,  microscopic 0-3    Bacteria, U Microscopic neg    Mucus, UA neg    Epithelial cells, urine per micros neg    Crystals, Ur, HPF, POC neg    Casts, Ur, LPF, POC neg    Yeast, UA neg     Results for orders placed or performed in visit on  03/07/15  POCT urinalysis dipstick  Result Value Ref Range   Color, UA yellow    Clarity, UA clear    Glucose, UA neg    Bilirubin, UA neg    Ketones, UA neg    Spec Grav, UA 1.015    Blood, UA trace    pH, UA 6.0    Protein, UA neg    Urobilinogen, UA 0.2    Nitrite, UA neg    Leukocytes, UA Negative   POCT UA - Microscopic Only  Result Value Ref Range   WBC, Ur, HPF, POC neg    RBC, urine, microscopic 0-3    Bacteria, U Microscopic neg    Mucus, UA neg    Epithelial cells, urine per micros neg    Crystals, Ur, HPF, POC neg    Casts, Ur, LPF, POC neg    Yeast, UA neg   POCT CBC  Result Value Ref Range   WBC 9.0 4.6 - 10.2 K/uL   Lymph, poc 1.5 0.6 - 3.4   POC LYMPH PERCENT 16.3 10 - 50 %L   MID (cbc) 1.0 (A) 0 - 0.9   POC MID % 10.9 0 - 12 %M   POC Granulocyte 6.6 2 - 6.9   Granulocyte percent 72.8 37 - 80 %G   RBC 4.82 4.04 - 5.48 M/uL   Hemoglobin 14.6 12.2 - 16.2 g/dL   HCT, POC 45.0 37.7 - 47.9 %   MCV 93.3 80 - 97 fL   MCH, POC 30.4 27 - 31.2 pg   MCHC 32.5 31.8 - 35.4 g/dL   RDW, POC 14.1 %   Platelet Count, POC 254 142 - 424 K/uL   MPV 8.1 0 - 99.8 fL

## 2015-03-15 ENCOUNTER — Encounter: Payer: Self-pay | Admitting: Emergency Medicine

## 2015-03-15 ENCOUNTER — Ambulatory Visit (INDEPENDENT_AMBULATORY_CARE_PROVIDER_SITE_OTHER): Payer: PPO | Admitting: Emergency Medicine

## 2015-03-15 VITALS — BP 118/62 | HR 77 | Temp 97.9°F | Resp 16 | Ht 61.5 in | Wt 128.0 lb

## 2015-03-15 DIAGNOSIS — R109 Unspecified abdominal pain: Secondary | ICD-10-CM

## 2015-03-15 DIAGNOSIS — Z79899 Other long term (current) drug therapy: Secondary | ICD-10-CM | POA: Diagnosis not present

## 2015-03-15 DIAGNOSIS — G4700 Insomnia, unspecified: Secondary | ICD-10-CM

## 2015-03-15 MED ORDER — CLOPIDOGREL BISULFATE 75 MG PO TABS: ORAL_TABLET | ORAL | Status: DC

## 2015-03-15 MED ORDER — RANITIDINE HCL 150 MG PO TABS: ORAL_TABLET | ORAL | Status: DC

## 2015-03-15 MED ORDER — LORAZEPAM 0.5 MG PO TABS: 0.5000 mg | ORAL_TABLET | Freq: Every evening | ORAL | Status: DC | PRN

## 2015-03-15 NOTE — Progress Notes (Signed)
Subjective:  This chart was scribed for Darlyne Russian, MD by Ladene Artist, ED Scribe. The patient was seen in room 22. Patient's care was started at 4:20 PM.   Patient ID: [name], female    DOB: [birth year], 81 y.o.   MRN: [id number]  Chief Complaint  Patient presents with  . Medication Refill    plavix   HPI HPI Comments: [name] is a 81 y.o. female, with a h/o CAD, aortic aneurysm, hypercholesteremia, HTN, colitis, GERD, arthritis, osteoporosis, who presents to the [facility] for a medication refill of Plavix.   Abdominal Pain Pt was seen on 03/07/15 by Dr. Ouida Sills for intermittent abdominal pain with nausea for the past week. Pt reports abdominal pain whether she eats or not. She denies heartburn and dysuria. No h/o abdominal surgeries. Pt's colonoscopies are UTD. She has not seen her gastroenterologist Dr. Cristina Gong since she turned 29 and was told that she did not any more colonoscopies.   Cardiology Pt sees cardiologist x1 year. She was last seen by cardiologist Dr. Martinique 8-9 months ago.   Skin Spot Pt noticed a spot on her L forearm 1 week ago. She has applied Neosporin to the area without improvement. Pt plans to schedule an appointment with her dermatologist.  Pt's granddaughter is 72 y.o.  Past Medical History  Diagnosis Date  . CAD (coronary artery disease)     a. Nonobstructive by last cath in 05/2004 with 30% LAD. b. EF 65-70% 12/2010. c. Hx ASA intolerance.   . Hypercholesteremia     Hx of myalgias felt to be statin related but taking Crestor as OP  . GERD (gastroesophageal reflux disease)     a. Minimal hiatal hernia on EGD 2006, no obvious esophageal source for CP at that time.  . Hypertension   . Osteoporosis   . Aortic dilatation     Seen on cath 2005  . Colitis     12/2010 associated with transient lower GI bleeding/anemia tx with abx  . Arthritis   . Aortic aneurysm, thoracic     4.4 cm   Current Outpatient  Prescriptions on File Prior to Visit  Medication Sig Dispense Refill  . Calcium Carbonate-Vitamin D (CALCIUM + D PO) Take 1 tablet by mouth every morning.     . Carboxymethylcellul-Glycerin (LUBRICATING EYE DROPS OP) Place 1 drop into both eyes 2 (two) times daily.    . cholecalciferol (VITAMIN D) 1000 UNITS tablet Take 1,000 Units by mouth daily.    . clopidogrel (PLAVIX) 75 MG tablet Take 1 tablet (75 mg total) by mouth daily. PATIENT NEEDS CHECK UP FOR ADDITIONAL REFILLS 30 tablet 0  . ESTRACE VAGINAL 0.1 MG/GM vaginal cream Place 1 Applicatorful vaginally once a week.     Marland Kitchen LORazepam (ATIVAN) 0.5 MG tablet Take 1 tablet (0.5 mg total) by mouth at bedtime as needed. For sleep. 30 tablet 2  . metoprolol succinate (TOPROL-XL) 25 MG 24 hr tablet Take 0.5 tablets (12.5 mg total) by mouth daily. 90 tablet 1  . Multiple Vitamins-Minerals (MULTIVITAMIN PO) Take 1 tablet by mouth daily.     . pantoprazole (PROTONIX) 40 MG tablet TAKE 1 TABLET AT NIGHT. 90 tablet 1  . Pitavastatin Calcium 1 MG TABS Take one tablet every evening 90 tablet 1  . polyethylene glycol powder (GLYCOLAX/MIRALAX) powder Take 17 g by mouth 2 (two) times daily as needed. 3350 g 1  . ranitidine (ZANTAC) 150 MG tablet TAKE 1 TABLET TWICE  DAILY. 180 tablet 1   No current facility-administered medications on file prior to visit.   Allergies  Allergen Reactions  . Aspirin Other (See Comments)    Feels like choking  . Bactrim Other (See Comments)    Unknown  . Crestor [Rosuvastatin] Other (See Comments)    Lower ext myalgia  . Niacin And Related Other (See Comments)    Turns red in the face  . Penicillins Other (See Comments)    Unknown  . Relafen [Nabumetone] Other (See Comments)    Unknown  . Tetracyclines & Related Rash   Review of Systems  Gastrointestinal: Positive for nausea and abdominal pain.  Genitourinary: Negative for dysuria.      Objective:   Physical Exam CONSTITUTIONAL: Well developed/well  nourished HEAD: Normocephalic/atraumatic EYES: EOMI/PERRL ENMT: Mucous membranes moist NECK: supple no meningeal signs SPINE/BACK:entire spine nontender CV: S1/S2 noted, no rubs/gallops noted, 2/6 systolic murmur at the base of the heart LUNGS: Lungs are clear to auscultation bilaterally, no apparent distress ABDOMEN: soft, nontender, no rebound or guarding, bowel sounds noted throughout abdomen GU:no cva tenderness, mild mid epigastric and LLQ abdominal pain NEURO: Pt is awake/alert/appropriate, moves all extremitiesx4. No facial droop.   EXTREMITIES: pulses normal/equal, full ROM SKIN: warm, color normal, 1x1 cm nodular area extensor L forearm  PSYCH: no abnormalities of mood noted, alert and oriented to situation    Assessment & Plan:   She is going to follow-up with her dermatologist regarding the lesion on her left arm. She is definitely constipated by KUB and I encouraged her to continue her MiraLAX. I did stop her PPI and will continue Zantac. Her Plavix was refilled and her lorazepam was refilled. Appointment made for her to see Dr. Cristina Gong for his opinion and ultrasound of the abdomen ordered.I personally performed the services described in this documentation, which was scribed in my presence. The recorded information has been reviewed and is accurate.

## 2015-03-15 NOTE — Progress Notes (Deleted)
   Subjective:    Patient ID: [name], female    DOB: 10/12/1926, 81 y.o.   MRN: [id number]  HPI    Review of Systems     Objective:   Physical Exam        Assessment & Plan:

## 2015-03-16 LAB — CBC WITH DIFFERENTIAL/PLATELET
BASOS ABS: 0.1 10*3/uL (ref 0.0–0.1)
BASOS PCT: 1 % (ref 0–1)
EOS ABS: 0.3 10*3/uL (ref 0.0–0.7)
Eosinophils Relative: 4 % (ref 0–5)
HCT: 40.7 % (ref 36.0–46.0)
Hemoglobin: 13.5 g/dL (ref 12.0–15.0)
Lymphocytes Relative: 17 % (ref 12–46)
Lymphs Abs: 1.1 10*3/uL (ref 0.7–4.0)
MCH: 30.3 pg (ref 26.0–34.0)
MCHC: 33.2 g/dL (ref 30.0–36.0)
MCV: 91.3 fL (ref 78.0–100.0)
MONOS PCT: 12 % (ref 3–12)
MPV: 10.1 fL (ref 8.6–12.4)
Monocytes Absolute: 0.8 10*3/uL (ref 0.1–1.0)
Neutro Abs: 4.2 10*3/uL (ref 1.7–7.7)
Neutrophils Relative %: 66 % (ref 43–77)
Platelets: 259 10*3/uL (ref 150–400)
RBC: 4.46 MIL/uL (ref 3.87–5.11)
RDW: 13.2 % (ref 11.5–15.5)
WBC: 6.4 10*3/uL (ref 4.0–10.5)

## 2015-03-16 LAB — COMPLETE METABOLIC PANEL WITH GFR
ALBUMIN: 3.7 g/dL (ref 3.5–5.2)
ALK PHOS: 54 U/L (ref 39–117)
ALT: 10 U/L (ref 0–35)
AST: 16 U/L (ref 0–37)
BUN: 18 mg/dL (ref 6–23)
CO2: 30 meq/L (ref 19–32)
Calcium: 9.2 mg/dL (ref 8.4–10.5)
Chloride: 102 mEq/L (ref 96–112)
Creat: 0.57 mg/dL (ref 0.50–1.10)
GFR, EST NON AFRICAN AMERICAN: 84 mL/min
GLUCOSE: 143 mg/dL — AB (ref 70–99)
Potassium: 3.9 mEq/L (ref 3.5–5.3)
SODIUM: 140 meq/L (ref 135–145)
TOTAL PROTEIN: 6.1 g/dL (ref 6.0–8.3)
Total Bilirubin: 0.4 mg/dL (ref 0.2–1.2)

## 2015-03-16 LAB — VITAMIN B12: VITAMIN B 12: 514 pg/mL (ref 211–911)

## 2015-03-16 LAB — MAGNESIUM: MAGNESIUM: 2 mg/dL (ref 1.5–2.5)

## 2015-03-16 LAB — FERRITIN: Ferritin: 51 ng/mL (ref 10–291)

## 2015-03-17 LAB — VITAMIN D 25 HYDROXY (VIT D DEFICIENCY, FRACTURES): VIT D 25 HYDROXY: 49 ng/mL (ref 30–100)

## 2015-03-23 ENCOUNTER — Other Ambulatory Visit: Payer: Self-pay | Admitting: Radiology

## 2015-03-23 ENCOUNTER — Ambulatory Visit
Admission: RE | Admit: 2015-03-23 | Discharge: 2015-03-23 | Disposition: A | Discharge status: Home / Self Care | Payer: PPO | Source: Ambulatory Visit | Attending: Emergency Medicine | Admitting: Emergency Medicine

## 2015-03-23 ENCOUNTER — Other Ambulatory Visit (INDEPENDENT_AMBULATORY_CARE_PROVIDER_SITE_OTHER): Payer: PPO | Admitting: Family Medicine

## 2015-03-23 DIAGNOSIS — R109 Unspecified abdominal pain: Secondary | ICD-10-CM

## 2015-03-23 DIAGNOSIS — E139 Other specified diabetes mellitus without complications: Secondary | ICD-10-CM

## 2015-03-23 LAB — POCT GLYCOSYLATED HEMOGLOBIN (HGB A1C): Hemoglobin A1C: 5.9

## 2015-03-23 LAB — GLUCOSE, POCT (MANUAL RESULT ENTRY): POC GLUCOSE: 105 mg/dL — AB (ref 70–99)

## 2015-04-26 ENCOUNTER — Ambulatory Visit (INDEPENDENT_AMBULATORY_CARE_PROVIDER_SITE_OTHER): Payer: PPO | Admitting: Neurology

## 2015-04-26 ENCOUNTER — Encounter: Payer: Self-pay | Admitting: Neurology

## 2015-04-26 VITALS — BP 120/66 | HR 64 | Wt 128.2 lb

## 2015-04-26 DIAGNOSIS — G459 Transient cerebral ischemic attack, unspecified: Secondary | ICD-10-CM

## 2015-04-26 DIAGNOSIS — M542 Cervicalgia: Secondary | ICD-10-CM | POA: Insufficient documentation
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
## 2015-04-26 NOTE — Patient Instructions (Addendum)
I had a long d/w patient and husband  about her remote TIA stroke, risk for recurrent stroke/TIAs, personally independently reviewed imaging studies and stroke evaluation results and answered questions.Continue Plavix for secondary stroke prevention and maintain strict control of hypertension with blood pressure goal below 130/90, diabetes with hemoglobin A1c goal below 6.5% and lipids with LDL cholesterol goal below 100 mg/dL. I also advised the patient to eat a healthy diet with plenty of whole grains, cereals, fruits and vegetables, exercise regularly and maintain ideal body weight. Check follow-up carotid ultrasound study. Followup in the future only as necessary and no routine follow-up is needed.

## 2015-05-02 ENCOUNTER — Other Ambulatory Visit: Payer: Self-pay | Admitting: Gastroenterology

## 2015-05-02 DIAGNOSIS — R1030 Lower abdominal pain, unspecified: Secondary | ICD-10-CM

## 2015-05-10 ENCOUNTER — Ambulatory Visit
Admission: RE | Admit: 2015-05-10 | Discharge: 2015-05-10 | Disposition: A | Discharge status: Home / Self Care | Payer: PPO | Source: Ambulatory Visit | Attending: Gastroenterology | Admitting: Gastroenterology

## 2015-05-10 DIAGNOSIS — R1030 Lower abdominal pain, unspecified: Secondary | ICD-10-CM

## 2015-05-13 ENCOUNTER — Telehealth: Payer: Self-pay

## 2015-05-13 NOTE — Telephone Encounter (Signed)
Received a call from [facility] 05/12/15 with Dr.Buccini's office.She stated she will be faxing patient's 05/10/15 CT Abdomen Pelvis.Stated Dr.Buccini wanted to let Dr.Jordan know it showed coronary calcifications.Received fax of CT of abd Dr.Jordan reviewed he is aware,no new findings.

## 2015-05-19 ENCOUNTER — Ambulatory Visit (INDEPENDENT_AMBULATORY_CARE_PROVIDER_SITE_OTHER): Payer: PPO

## 2015-05-19 DIAGNOSIS — G459 Transient cerebral ischemic attack, unspecified: Secondary | ICD-10-CM

## 2015-06-17 ENCOUNTER — Encounter: Payer: Self-pay | Admitting: Cardiology

## 2015-06-17 ENCOUNTER — Ambulatory Visit (INDEPENDENT_AMBULATORY_CARE_PROVIDER_SITE_OTHER): Payer: PPO | Admitting: Cardiology

## 2015-06-17 VITALS — BP 133/73 | HR 62 | Ht 61.0 in | Wt 129.5 lb

## 2015-06-17 DIAGNOSIS — I2510 Atherosclerotic heart disease of native coronary artery without angina pectoris: Secondary | ICD-10-CM

## 2015-06-17 DIAGNOSIS — I712 Thoracic aortic aneurysm, without rupture, unspecified: Secondary | ICD-10-CM

## 2015-06-17 DIAGNOSIS — E7800 Pure hypercholesterolemia, unspecified: Secondary | ICD-10-CM

## 2015-06-17 NOTE — Patient Instructions (Signed)
Continue your current therapy  I will see you in one year   

## 2015-06-17 NOTE — Progress Notes (Signed)
[name] Date of Birth: 1926/02/08 Medical Record #[id number]  History of Present Illness: [name] is seen today for followup. She has a history of ascending aortic ectasia and mild aneurysm.  CT of the chest in 2002 showed mild aortic ectasia measured at 3.7 cm. She had some coronary calcification consistent with atherosclerosis. She had a Myoview study which was normal. She has a history of prior cardiac catheterization in 2005 which showed mild nonobstructive disease. CT in 2013 showed a maximal aortic diameter of 4.2 cm and more recent CT in June 2015 it was 4.4 cm. She remains on Plavix and metoprolol therapy. She remains active. She was intolerant to lipitor but is tolerating Livalo at 1 mg daily. She had extensive evaluation one year ago for suspected TIA. Echo was unremarkable.   Current Outpatient Prescriptions on File Prior to Visit  Medication Sig Dispense Refill  . Calcium Carbonate-Vitamin D (CALCIUM + D PO) Take 1 tablet by mouth every morning.     . Carboxymethylcellul-Glycerin (LUBRICATING EYE DROPS OP) Place 1 drop into both eyes 2 (two) times daily.    . cholecalciferol (VITAMIN D) 1000 UNITS tablet Take 1,000 Units by mouth daily.    . clopidogrel (PLAVIX) 75 MG tablet Take 1 tablet daily 30 tablet 11  . LORazepam (ATIVAN) 0.5 MG tablet Take 1 tablet (0.5 mg total) by mouth at bedtime as needed. For sleep. 30 tablet 5  . metoprolol succinate (TOPROL-XL) 25 MG 24 hr tablet Take 0.5 tablets (12.5 mg total) by mouth daily. 90 tablet 1  . Multiple Vitamins-Minerals (MULTIVITAMIN PO) Take 1 tablet by mouth daily.     . Pitavastatin Calcium 1 MG TABS Take one tablet every evening 90 tablet 1  . polyethylene glycol powder (GLYCOLAX/MIRALAX) powder Take 17 g by mouth 2 (two) times daily as needed. 3350 g 1  . ranitidine (ZANTAC) 150 MG tablet TAKE 1 TABLET TWICE DAILY. 180 tablet 3  . ESTRACE VAGINAL 0.1 MG/GM vaginal cream Place 1 Applicatorful vaginally once a  week.      No current facility-administered medications on file prior to visit.    Allergies  Allergen Reactions  . Aspirin Other (See Comments)    Feels like choking  . Bactrim Other (See Comments)    Unknown  . Crestor [Rosuvastatin] Other (See Comments)    Lower ext myalgia  . Niacin And Related Other (See Comments)    Turns red in the face  . Penicillins Other (See Comments)    Unknown  . Relafen [Nabumetone] Other (See Comments)    Unknown  . Tetracyclines & Related Rash    Past Medical History  Diagnosis Date  . CAD (coronary artery disease)     a. Nonobstructive by last cath in 05/2004 with 30% LAD. b. EF 65-70% 12/2010. c. Hx ASA intolerance.   . Hypercholesteremia     Hx of myalgias felt to be statin related but taking Crestor as OP  . GERD (gastroesophageal reflux disease)     a. Minimal hiatal hernia on EGD 2006, no obvious esophageal source for CP at that time.  . Hypertension   . Osteoporosis   . Aortic dilatation     Seen on cath 2005  . Colitis     12/2010 associated with transient lower GI bleeding/anemia tx with abx  . Arthritis   . Aortic aneurysm, thoracic     4.4 cm    Past Surgical History  Procedure Laterality Date  . Back surgery  Disc disease  . Hysterectomy -- unknown type    . Shunt for syringomyelia 1987    . Temporal artery biopsy / ligation      Negative 2005  . Shoulder surgery    . Abdominal hysterectomy      History  Smoking status  . Former Smoker  . Quit date: 04/26/1959  Smokeless tobacco  . Not on file    History  Alcohol Use No    Family History  Problem Relation Age of Onset  . Heart disease    . Heart failure    . Heart disease Mother   . Heart failure Mother   . Arthritis Mother   . Heart disease Brother     Review of Systems: As noted in history of present illness.   All other systems were reviewed and are negative.  Physical Exam: BP 133/73 mmHg  Pulse 62  Ht 5\' 1"  (1.549 m)  Wt 58.741 kg (129  lb 8 oz)  BMI 24.48 kg/m2 She is a pleasant elderly white female in no acute distress. HEENT exam is unremarkable. She has no JVD, adenopathy, thyromegaly, or bruits. Lungs are clear. Cardiovascular regular rate and rhythm, normal S1 and S2. Grade 2/6 systolic murmur in the RUSB Abdomen is soft and nontender without masses or bruits. Extremities are without clubbing or edema. Pedal pulses are good. Neurologic exam she is alert and oriented x3. Cranial nerves II through XII are intact.  LABORATORY DATA:   Lab Results  Component Value Date   WBC 6.4 03/15/2015   HGB 13.5 03/15/2015   HCT 40.7 03/15/2015   PLT 259 03/15/2015   GLUCOSE 143* 03/15/2015   CHOL 199 02/10/2015   TRIG 129 02/10/2015   HDL 63 02/10/2015   LDLCALC 110* 02/10/2015   ALT 10 03/15/2015   AST 16 03/15/2015   NA 140 03/15/2015   K 3.9 03/15/2015   CL 102 03/15/2015   CREATININE 0.57 03/15/2015   BUN 18 03/15/2015   CO2 30 03/15/2015   TSH 3.334 07/29/2012   INR 1.05 07/30/2014   HGBA1C 5.9 03/23/2015   Assessment / Plan: 1. History of Nonobstructive coronary disease with coronary calcification. Patient is  asymptomatic.  Myoview study July 2013 was normal. Continue risk factor modification.   2. Thoracic aortic aneurysm. 4.4 cm with mild growth over the past 13 years. Risk of dissection is low. BP control is good. She is not interested in any further follow up and I think it is unlikely that this will ever cause any problems. I will follow up in a year.   3. History of sinus bradycardia. HR Ok on current metoprolol dose.

## 2015-06-27 ENCOUNTER — Other Ambulatory Visit: Payer: Self-pay

## 2015-08-06 ENCOUNTER — Other Ambulatory Visit: Payer: Self-pay | Admitting: Emergency Medicine

## 2015-08-08 ENCOUNTER — Other Ambulatory Visit: Payer: Self-pay | Admitting: Emergency Medicine

## 2015-08-10 ENCOUNTER — Ambulatory Visit (INDEPENDENT_AMBULATORY_CARE_PROVIDER_SITE_OTHER): Payer: PPO | Admitting: Family Medicine

## 2015-08-10 VITALS — BP 126/66 | HR 65 | Temp 97.9°F | Resp 16 | Ht 61.5 in | Wt 130.0 lb

## 2015-08-10 DIAGNOSIS — S80211A Abrasion, right knee, initial encounter: Secondary | ICD-10-CM

## 2015-08-10 NOTE — Patient Instructions (Signed)
Right knee abrasion:  - Continue to monitor the size of the abrasion. - You are doing wonderfully caring for it. Continue with neosporin and gentle cleansing with soap and water. No peroxide/alcohol.  - Please call us immediately if you notice a growth in the size of the redness or you develop fever or chills.   - f/u with your PCP as needed in the future.

## 2015-08-10 NOTE — Progress Notes (Signed)
[facility] [address] [place] [address] [phone]  Date:  08/10/2015   Name:  [name]   DOB:  1926/12/12   MRN:  [id number]  PCP:  Jenny Reichmann, MD    Chief Complaint: Knee Pain   History of Present Illness:  [name] is a 81 y.o. very pleasant female patient with h/o CAD, aortic aneurysm, hypercholesteremia, HTN, colitis, GERD, arthritis, osteoporosis (no known fracture) who presents following a fall.   - Fell 8 days ago taking garbage to end of driveway and fell while twisting around to [employer]. Did not feel dizzy prior to falling. - Denies hitting head - Did not take ativan prior to falling.   - Takes plavix.  - No history of knee trouble prior to this fall.  - Taking tylenol, which eliminates the pain from a starting 5/10.  - Able to walk with no pain.   - No tightness - Abrasion on knee cap with surrounding erythema, which she doesn't believe is expanding.  She is most concerned about a possible infection.     - She has been using neopsorin bid    - No fevers, chills, or night sweats    - no drainage.  - No previous fractures, but has been diagnosed with osteoporosis.  - No history of MRSA  Patient Active Problem List   Diagnosis Date Noted  . Neck pain, musculoskeletal 04/26/2015  . Variants of migraine, not elsewhere classified, without mention of intractable migraine without mention of status migrainosus 08/17/2014  . Tension headache 08/17/2014  . TIA (transient ischemic attack) 07/30/2014  . HTN (hypertension) 07/30/2014  . Aortic aneurysm, thoracic 06/25/2014  . Chest pain 07/29/2012  . CAD (coronary artery disease) 06/03/2012  . Hypercholesteremia   . GERD (gastroesophageal reflux disease)     Past Medical History  Diagnosis Date  . CAD (coronary artery disease)     a. Nonobstructive by last cath in 05/2004 with 30% LAD. b. EF 65-70% 12/2010. c. Hx ASA intolerance.   . Hypercholesteremia     Hx of  myalgias felt to be statin related but taking Crestor as OP  . GERD (gastroesophageal reflux disease)     a. Minimal hiatal hernia on EGD 2006, no obvious esophageal source for CP at that time.  . Hypertension   . Osteoporosis   . Aortic dilatation     Seen on cath 2005  . Colitis     12/2010 associated with transient lower GI bleeding/anemia tx with abx  . Arthritis   . Aortic aneurysm, thoracic     4.4 cm    Past Surgical History  Procedure Laterality Date  . Back surgery      Disc disease  . Hysterectomy -- unknown type    . Shunt for syringomyelia 1987    . Temporal artery biopsy / ligation      Negative 2005  . Shoulder surgery    . Abdominal hysterectomy      Social History  Substance Use Topics  . Smoking status: Former Smoker    Quit date: 04/26/1959  . Smokeless tobacco: None  . Alcohol Use: No    Family History  Problem Relation Age of Onset  . Heart disease    . Heart failure    . Heart disease Mother   . Heart failure Mother   . Arthritis Mother   . Heart disease Brother     Allergies  Allergen Reactions  .  Aspirin Other (See Comments)    Feels like choking  . Bactrim Other (See Comments)    Unknown  . Crestor [Rosuvastatin] Other (See Comments)    Lower ext myalgia  . Niacin And Related Other (See Comments)    Turns red in the face  . Penicillins Other (See Comments)    Unknown  . Relafen [Nabumetone] Other (See Comments)    Unknown  . Tetracyclines & Related Rash    Medication list has been reviewed and updated.  Current Outpatient Prescriptions on File Prior to Visit  Medication Sig Dispense Refill  . Calcium Carbonate-Vitamin D (CALCIUM + D PO) Take 1 tablet by mouth every morning.     . Carboxymethylcellul-Glycerin (LUBRICATING EYE DROPS OP) Place 1 drop into both eyes 2 (two) times daily.    . cholecalciferol (VITAMIN D) 1000 UNITS tablet Take 1,000 Units by mouth daily.    . clopidogrel (PLAVIX) 75 MG tablet Take 1 tablet daily 30  tablet 11  . ESTRACE VAGINAL 0.1 MG/GM vaginal cream Place 1 Applicatorful vaginally once a week.     Marland Kitchen LORazepam (ATIVAN) 0.5 MG tablet Take 1 tablet (0.5 mg total) by mouth at bedtime as needed. For sleep. 30 tablet 5  . metoprolol succinate (TOPROL-XL) 25 MG 24 hr tablet TAKE 1/2 TABLET DAILY. 15 tablet 0  . Multiple Vitamins-Minerals (MULTIVITAMIN PO) Take 1 tablet by mouth daily.     . Pitavastatin Calcium (LIVALO) 1 MG TABS Take 1 tablet (1 mg total) by mouth daily. PATIENT NEEDS OFFICE VISIT FOR ADDITIONAL REFILLS 30 tablet 0  . polyethylene glycol powder (GLYCOLAX/MIRALAX) powder Take 17 g by mouth 2 (two) times daily as needed. 3350 g 1  . ranitidine (ZANTAC) 150 MG tablet TAKE 1 TABLET TWICE DAILY. 180 tablet 3   No current facility-administered medications on file prior to visit.    Review of Systems:  Review of Systems  Constitutional: Negative for fever, chills and malaise/fatigue.  Respiratory: Negative for shortness of breath.   Cardiovascular: Negative for chest pain, palpitations and leg swelling.  Gastrointestinal: Negative for nausea and vomiting.  Genitourinary: Negative for dysuria, urgency and frequency.  Musculoskeletal: Positive for falls.  Skin: Negative for rash.       Abrasion on right knee  Neurological: Negative for dizziness.  Endo/Heme/Allergies: Does not bruise/bleed easily.    Physical Examination: Filed Vitals:   08/10/15 1806  BP: 126/66  Pulse: 65  Temp: 97.9 F (36.6 C)  Resp: 16   Filed Vitals:   08/10/15 1806  Height: 5' 1.5" (1.562 m)  Weight: 130 lb (58.968 kg)   Body mass index is 24.17 kg/(m^2). Ideal Body Weight: Weight in (lb) to have BMI = 25: 134.2  GEN: WDWN, NAD, Non-toxic, A & O x 3 HEENT: Atraumatic, Normocephalic. Neck supple. No masses, No LAD. Ears and Nose: No external deformity. CV: RRR, No M/G/R. No JVD. No thrill. No extra heart sounds. PULM: CTAB, no wheezes, crackles, rhonchi. No retractions. No resp.  distress. No accessory muscle use. ABD: S, NT, ND, +BS. No rebound. No HSM. EXTR: No c/c/e.  4cm abrasion overlying right patella. 1 additional cm of surrounding erythema. 2nd abrasion, 2cm in diameter total. No warmth to palpation. No underlying fluctuance.  Full hip and knee RoM without discomfort. 5/5 strength with knee extension/flexion. Mildly tender along joint lines. Ligaments intact. No drainage.  NEURO Normal gait, non-antalgic PSYCH: Normally interactive. Conversant. Not depressed or anxious appearing.  Calm demeanor.   Assessment and Plan: Right  knee abrasion following fall 8 days ago:  - No signs of local infection. No systemic symptoms. - outer margin of wound circled with marker - Discussed risks and benefits and antibiotics and patient and decided to monitor at this time.   - f/u PRN for any progression of the local infection, failure to resolve, or development of systemic symptoms.   Signed Gerre Pebbles, MD

## 2015-08-12 NOTE — Progress Notes (Signed)
Patient was evaluated by me, Agree with A/P Dr Marin Comment

## 2015-08-30 ENCOUNTER — Ambulatory Visit (INDEPENDENT_AMBULATORY_CARE_PROVIDER_SITE_OTHER): Payer: PPO | Admitting: Emergency Medicine

## 2015-08-30 ENCOUNTER — Encounter: Payer: Self-pay | Admitting: Emergency Medicine

## 2015-08-30 VITALS — BP 120/62 | HR 61 | Temp 97.7°F | Resp 16 | Ht 60.5 in | Wt 128.0 lb

## 2015-08-30 DIAGNOSIS — Z0000 Encounter for general adult medical examination without abnormal findings: Secondary | ICD-10-CM

## 2015-08-30 DIAGNOSIS — G459 Transient cerebral ischemic attack, unspecified: Secondary | ICD-10-CM

## 2015-08-30 DIAGNOSIS — E7800 Pure hypercholesterolemia, unspecified: Secondary | ICD-10-CM

## 2015-08-30 DIAGNOSIS — Z23 Encounter for immunization: Secondary | ICD-10-CM

## 2015-08-30 NOTE — Progress Notes (Addendum)
This chart was scribed for Karen Queen, MD by [name], Medical Scribe. This patient was seen in Room 22 and the patient's care was started 3:00 PM.  Chief Complaint:  Chief Complaint  Patient presents with  . Annual Exam    no pap    HPI: [name] is a 81 y.o. female who reports to [facility] today for a physical exam.  She's called for the ambulance for her husband recently. He's doing better after some medication management. Overall, she's doing well.   Immunizations She had prevnar vaccine done Aug 2015. She also had shingles vaccine May 28th, 2014.  She also had pneumonia vaccine done. She was recommended for a flu shot and received one today.   Exercise She's still walking occasionally.   Right knee She was taking the trash bin outside but she fell and injured her right knee. She didn't see any providers and took care of the wound and bruising herself. But, she was concerned if she had an injection and came in on Aug 10th to get it checked. Dr. Marin Comment saw her and said it was fine.   Eye She sees eye doctor annually.   Cancer Screening She has an appointment for mammogram next week.   Skin Her next appointment with dermatologist is May 2017.     Past Medical History  Diagnosis Date  . CAD (coronary artery disease)     a. Nonobstructive by last cath in 05/2004 with 30% LAD. b. EF 65-70% 12/2010. c. Hx ASA intolerance.   . Hypercholesteremia     Hx of myalgias felt to be statin related but taking Crestor as OP  . GERD (gastroesophageal reflux disease)     a. Minimal hiatal hernia on EGD 2006, no obvious esophageal source for CP at that time.  . Hypertension   . Osteoporosis   . Aortic dilatation     Seen on cath 2005  . Colitis     12/2010 associated with transient lower GI bleeding/anemia tx with abx  . Arthritis   . Aortic aneurysm, thoracic     4.4 cm   Past Surgical History  Procedure Laterality Date  . Back surgery      Disc disease  .  Hysterectomy -- unknown type    . Shunt for syringomyelia 1987    . Temporal artery biopsy / ligation      Negative 2005  . Shoulder surgery    . Abdominal hysterectomy     Social History   Social History  . Marital Status: Married    Spouse Name: N/A  . Number of Children: 2  . Years of Education: college    Occupational History  . retired     Social History Main Topics  . Smoking status: Former Smoker    Quit date: 04/26/1959  . Smokeless tobacco: None  . Alcohol Use: No  . Drug Use: No  . Sexual Activity: No   Other Topics Concern  . None   Social History Narrative   Patient lives with her husband    Patient is right handed   Patient drinks coffee daily   Family History  Problem Relation Age of Onset  . Heart disease    . Heart failure    . Heart disease Mother   . Heart failure Mother   . Arthritis Mother   . Heart disease Brother    Allergies  Allergen Reactions  . Aspirin Other (See Comments)    Feels like  choking  . Bactrim Other (See Comments)    Unknown  . Crestor [Rosuvastatin] Other (See Comments)    Lower ext myalgia  . Niacin And Related Other (See Comments)    Turns red in the face  . Penicillins Other (See Comments)    Unknown  . Relafen [Nabumetone] Other (See Comments)    Unknown  . Tetracyclines & Related Rash   Prior to Admission medications   Medication Sig Start Date End Date Taking? Authorizing Provider  Calcium Carbonate-Vitamin D (CALCIUM + D PO) Take 1 tablet by mouth every morning.     Historical Provider, MD  Carboxymethylcellul-Glycerin (LUBRICATING EYE DROPS OP) Place 1 drop into both eyes 2 (two) times daily.    Historical Provider, MD  cholecalciferol (VITAMIN D) 1000 UNITS tablet Take 1,000 Units by mouth daily.    Historical Provider, MD  clopidogrel (PLAVIX) 75 MG tablet Take 1 tablet daily 03/15/15   Darlyne Russian, MD  ESTRACE VAGINAL 0.1 MG/GM vaginal cream Place 1 Applicatorful vaginally once a week.  05/13/14    Historical Provider, MD  LORazepam (ATIVAN) 0.5 MG tablet Take 1 tablet (0.5 mg total) by mouth at bedtime as needed. For sleep. 03/15/15   Darlyne Russian, MD  metoprolol succinate (TOPROL-XL) 25 MG 24 hr tablet TAKE 1/2 TABLET DAILY. 08/08/15   Darlyne Russian, MD  Multiple Vitamins-Minerals (MULTIVITAMIN PO) Take 1 tablet by mouth daily.     Historical Provider, MD  Pitavastatin Calcium (LIVALO) 1 MG TABS Take 1 tablet (1 mg total) by mouth daily. PATIENT NEEDS OFFICE VISIT FOR ADDITIONAL REFILLS 08/09/15   Gay Filler Copland, MD  polyethylene glycol powder (GLYCOLAX/MIRALAX) powder Take 17 g by mouth 2 (two) times daily as needed. 03/07/15   Roselee Culver, MD  ranitidine (ZANTAC) 150 MG tablet TAKE 1 TABLET TWICE DAILY. 03/15/15   Darlyne Russian, MD     ROS: The patient denies fevers, chills, night sweats, unintentional weight loss, chest pain, palpitations, wheezing, dyspnea on exertion, nausea, vomiting, abdominal pain, dysuria, hematuria, melena, numbness, weakness, or tingling.   All other systems have been reviewed and were otherwise negative with the exception of those mentioned in the HPI and as above.    PHYSICAL EXAM: Filed Vitals:   08/30/15 1440  BP: 120/62  Pulse: 61  Temp: 97.7 F (36.5 C)  Resp: 16   Body mass index is 24.58 kg/(m^2).   General: Alert, no acute distress HEENT:  Normocephalic, atraumatic, oropharynx patent. Eye: Juliette Mangle [facility] Cardiovascular:  Regular rate and rhythm, no rubs murmurs or gallops.  No Carotid bruits, radial pulse intact. No pedal edema.  Respiratory: Clear to auscultation bilaterally.  No wheezes, rales, or rhonchi.  No cyanosis, no use of accessory musculature Abdominal: No organomegaly, abdomen is soft and non-tender, positive bowel sounds.  No masses. Musculoskeletal: Gait intact. No edema, tenderness there is a healing wound over the right kneecap. Skin: No rashes. Neurologic: Facial musculature symmetric.  Psychiatric: Patient acts  appropriately throughout our interaction.  Lymphatic: No cervical or submandibular lymphadenopathy Genitourinary/Anorectal: No acute findings   LABS: Results for orders placed or performed in visit on 03/23/15  POCT glycosylated hemoglobin (Hb A1C)  Result Value Ref Range   Hemoglobin A1C 5.9   POCT glucose (manual entry)  Result Value Ref Range   POC Glucose 105 (A) 70 - 99 mg/dl     EKG/XRAY:   Primary read interpreted by Dr. Everlene Farrier at [facility].   ASSESSMENT/PLAN:  1. Annual physical exam  Physical exam is within normal limits.  2. Transient cerebral ischemia, unspecified transient cerebral ischemia type No further neurological symptoms.  3. Hypercholesteremia Continue statin and follow-up with your cardiologist.  4. Need for prophylactic vaccination and inoculation against influenza  - Flu Vaccine QUAD 36+ mos IM 5. Wound Knee  Patient has a healing wound over her knee. No evidence of infection. I personally performed the services described in this documentation, which was scribed in my presence. The recorded information has been reviewed and is accurate.  Karen Queen, MD  [facility]  08/30/2015 3:24 PM  Gross sideeffects, risk and benefits, and alternatives of medications d/w patient. Patient is aware that all medications have potential sideeffects and we are unable to predict every sideeffect or drug-drug interaction that may occur.  Karen Queen MD 08/30/2015 3:00 PM

## 2015-09-02 ENCOUNTER — Telehealth: Payer: Self-pay | Admitting: Emergency Medicine

## 2015-09-02 NOTE — Telephone Encounter (Signed)
Patient states that she needs 5 of her meds refilled. Lorazepam, Rinadine, and some others.   [phone]

## 2015-09-02 NOTE — Telephone Encounter (Signed)
Advise on refill for Lorazepam.

## 2015-09-03 ENCOUNTER — Other Ambulatory Visit: Payer: Self-pay | Admitting: Emergency Medicine

## 2015-09-03 DIAGNOSIS — G4700 Insomnia, unspecified: Secondary | ICD-10-CM

## 2015-09-03 MED ORDER — LORAZEPAM 0.5 MG PO TABS: 0.5000 mg | ORAL_TABLET | Freq: Every evening | ORAL | Status: DC | PRN

## 2015-09-03 NOTE — Telephone Encounter (Signed)
Prescription will be sent.

## 2015-09-05 MED ORDER — METOPROLOL SUCCINATE ER 25 MG PO TB24: 12.5000 mg | ORAL_TABLET | Freq: Every day | ORAL | Status: DC

## 2015-09-05 MED ORDER — CLOPIDOGREL BISULFATE 75 MG PO TABS: ORAL_TABLET | ORAL | Status: DC

## 2015-09-05 MED ORDER — RANITIDINE HCL 150 MG PO TABS: ORAL_TABLET | ORAL | Status: DC

## 2015-09-05 MED ORDER — PITAVASTATIN CALCIUM 1 MG PO TABS: 1.0000 | ORAL_TABLET | Freq: Every day | ORAL | Status: DC

## 2015-09-05 NOTE — Telephone Encounter (Signed)
Rxs sent

## 2015-09-05 NOTE — Addendum Note (Signed)
Addended by: Jannette Spanner on: 09/05/2015 09:47 AM   Modules accepted: Orders

## 2015-09-05 NOTE — Telephone Encounter (Signed)
Rx for Lorazepam.

## 2015-09-05 NOTE — Addendum Note (Signed)
Addended by: Jannette Spanner on: 09/05/2015 12:00 PM   Modules accepted: Orders

## 2015-09-06 ENCOUNTER — Other Ambulatory Visit: Payer: Self-pay | Admitting: Family Medicine

## 2015-09-06 ENCOUNTER — Telehealth: Payer: Self-pay | Admitting: Emergency Medicine

## 2015-09-06 DIAGNOSIS — Z0000 Encounter for general adult medical examination without abnormal findings: Secondary | ICD-10-CM

## 2015-09-06 NOTE — Telephone Encounter (Signed)
Please place a future order for CBC,  Cmet, hemoglobin A1c, and lipids.

## 2015-09-06 NOTE — Telephone Encounter (Signed)
Patient came in for wellness exam. She did not have blood work done. She wants to know if she needs to come back in for that.  [phone]

## 2015-09-07 NOTE — Telephone Encounter (Signed)
Orders placed. Pt notified.

## 2015-09-08 ENCOUNTER — Other Ambulatory Visit (INDEPENDENT_AMBULATORY_CARE_PROVIDER_SITE_OTHER): Payer: PPO

## 2015-09-08 DIAGNOSIS — Z0000 Encounter for general adult medical examination without abnormal findings: Secondary | ICD-10-CM

## 2015-09-08 LAB — COMPREHENSIVE METABOLIC PANEL
ALT: 10 U/L (ref 6–29)
AST: 22 U/L (ref 10–35)
Albumin: 3.7 g/dL (ref 3.6–5.1)
Alkaline Phosphatase: 48 U/L (ref 33–130)
BUN: 15 mg/dL (ref 7–25)
CHLORIDE: 104 mmol/L (ref 98–110)
CO2: 31 mmol/L (ref 20–31)
Calcium: 9.6 mg/dL (ref 8.6–10.4)
Creat: 0.67 mg/dL (ref 0.60–0.88)
GLUCOSE: 94 mg/dL (ref 65–99)
POTASSIUM: 4.7 mmol/L (ref 3.5–5.3)
SODIUM: 142 mmol/L (ref 135–146)
Total Bilirubin: 0.7 mg/dL (ref 0.2–1.2)
Total Protein: 6.4 g/dL (ref 6.1–8.1)

## 2015-09-08 LAB — CBC WITH DIFFERENTIAL/PLATELET
BASOS PCT: 1 % (ref 0–1)
Basophils Absolute: 0.1 10*3/uL (ref 0.0–0.1)
EOS ABS: 0.3 10*3/uL (ref 0.0–0.7)
EOS PCT: 5 % (ref 0–5)
HCT: 39.8 % (ref 36.0–46.0)
Hemoglobin: 14 g/dL (ref 12.0–15.0)
LYMPHS ABS: 1.1 10*3/uL (ref 0.7–4.0)
Lymphocytes Relative: 18 % (ref 12–46)
MCH: 31.8 pg (ref 26.0–34.0)
MCHC: 35.2 g/dL (ref 30.0–36.0)
MCV: 90.5 fL (ref 78.0–100.0)
MONOS PCT: 13 % — AB (ref 3–12)
MPV: 10.1 fL (ref 8.6–12.4)
Monocytes Absolute: 0.8 10*3/uL (ref 0.1–1.0)
Neutro Abs: 3.9 10*3/uL (ref 1.7–7.7)
Neutrophils Relative %: 63 % (ref 43–77)
PLATELETS: 211 10*3/uL (ref 150–400)
RBC: 4.4 MIL/uL (ref 3.87–5.11)
RDW: 13.6 % (ref 11.5–15.5)
WBC: 6.2 10*3/uL (ref 4.0–10.5)

## 2015-09-08 LAB — HEMOGLOBIN A1C
HEMOGLOBIN A1C: 6.1 % — AB (ref <5.7)
MEAN PLASMA GLUCOSE: 128 mg/dL — AB (ref <117)

## 2015-09-08 LAB — LIPID PANEL
CHOL/HDL RATIO: 3.3 ratio (ref <=5.0)
CHOLESTEROL: 214 mg/dL — AB (ref 125–200)
HDL: 64 mg/dL (ref >=46)
LDL CALC: 119 mg/dL (ref <130)
Triglycerides: 156 mg/dL — ABNORMAL HIGH (ref <150)
VLDL: 31 mg/dL — AB (ref <30)

## 2015-09-30 LAB — HM MAMMOGRAPHY

## 2015-10-04 ENCOUNTER — Encounter: Payer: Self-pay | Admitting: Emergency Medicine

## 2015-10-04 ENCOUNTER — Encounter: Payer: Self-pay | Admitting: Family Medicine

## 2015-11-07 ENCOUNTER — Other Ambulatory Visit: Payer: Self-pay | Admitting: Emergency Medicine

## 2015-12-08 ENCOUNTER — Other Ambulatory Visit: Payer: Self-pay | Admitting: Emergency Medicine

## 2016-01-09 ENCOUNTER — Other Ambulatory Visit: Payer: Self-pay | Admitting: Emergency Medicine

## 2016-02-07 ENCOUNTER — Other Ambulatory Visit: Payer: Self-pay | Admitting: Emergency Medicine

## 2016-02-13 ENCOUNTER — Other Ambulatory Visit: Payer: Self-pay | Admitting: Emergency Medicine

## 2016-02-21 DIAGNOSIS — Z87440 Personal history of urinary (tract) infections: Secondary | ICD-10-CM | POA: Diagnosis not present

## 2016-02-21 DIAGNOSIS — Z0000 Encounter for general adult medical examination without abnormal findings: Secondary | ICD-10-CM | POA: Diagnosis not present

## 2016-03-01 ENCOUNTER — Ambulatory Visit (INDEPENDENT_AMBULATORY_CARE_PROVIDER_SITE_OTHER): Payer: PPO | Admitting: Emergency Medicine

## 2016-03-01 ENCOUNTER — Encounter: Payer: Self-pay | Admitting: Emergency Medicine

## 2016-03-01 VITALS — BP 120/60 | HR 64 | Temp 97.5°F | Resp 16 | Ht 60.5 in | Wt 128.4 lb

## 2016-03-01 DIAGNOSIS — R739 Hyperglycemia, unspecified: Secondary | ICD-10-CM

## 2016-03-01 DIAGNOSIS — E7800 Pure hypercholesterolemia, unspecified: Secondary | ICD-10-CM

## 2016-03-01 DIAGNOSIS — G459 Transient cerebral ischemic attack, unspecified: Secondary | ICD-10-CM | POA: Diagnosis not present

## 2016-03-01 LAB — POCT GLYCOSYLATED HEMOGLOBIN (HGB A1C): HEMOGLOBIN A1C: 6.1

## 2016-03-01 LAB — BASIC METABOLIC PANEL WITH GFR
BUN: 16 mg/dL (ref 7–25)
CHLORIDE: 103 mmol/L (ref 98–110)
CO2: 32 mmol/L — AB (ref 20–31)
Calcium: 9.4 mg/dL (ref 8.6–10.4)
Creat: 0.6 mg/dL (ref 0.60–0.88)
GFR, EST NON AFRICAN AMERICAN: 82 mL/min (ref >=60)
GFR, Est African American: >89 mL/min (ref >=60)
Glucose, Bld: 100 mg/dL — ABNORMAL HIGH (ref 65–99)
POTASSIUM: 4.3 mmol/L (ref 3.5–5.3)
SODIUM: 141 mmol/L (ref 135–146)

## 2016-03-01 LAB — LIPID PANEL
Cholesterol: 200 mg/dL (ref 125–200)
HDL: 66 mg/dL (ref >=46)
LDL CALC: 106 mg/dL (ref <130)
Total CHOL/HDL Ratio: 3 Ratio (ref <=5.0)
Triglycerides: 138 mg/dL (ref <150)
VLDL: 28 mg/dL (ref <30)

## 2016-03-01 LAB — GLUCOSE, POCT (MANUAL RESULT ENTRY): POC GLUCOSE: 102 mg/dL — AB (ref 70–99)

## 2016-03-01 NOTE — Progress Notes (Signed)
Patient ID: [name], female   DOB: [birth year], 82 y.o.   MRN: [id number]    By signing my name below, I, Essence Howell, attest that this documentation has been prepared under the direction and in the presence of Darlyne Russian, MD Electronically Signed: Ladene Artist, ED Scribe 03/01/2016 at 10:28 AM.  Chief Complaint:  Chief Complaint  Patient presents with  . Follow-up  . Cholesterol    elevated   HPI: [name] is a 82 y.o. female who reports to [facility] today to discuss care after retirement. Pt states she is doing well overall, but she has been caring for her husband. No symptoms reported at this visit.   Exercise Pt walks around her neighborhood 4-5 days/week for 35-4 minutes.   Pt plans to go to the beach tomorrow to visit [place] condo.   Past Medical History  Diagnosis Date  . CAD (coronary artery disease)     a. Nonobstructive by last cath in 05/2004 with 30% LAD. b. EF 65-70% 12/2010. c. Hx ASA intolerance.   . Hypercholesteremia     Hx of myalgias felt to be statin related but taking Crestor as OP  . GERD (gastroesophageal reflux disease)     a. Minimal hiatal hernia on EGD 2006, no obvious esophageal source for CP at that time.  . Hypertension   . Osteoporosis   . Aortic dilatation ([place])     Seen on cath 2005  . Colitis     12/2010 associated with transient lower GI bleeding/anemia tx with abx  . Arthritis   . Aortic aneurysm, thoracic (HCC)     4.4 cm   Past Surgical History  Procedure Laterality Date  . Back surgery      Disc disease  . Hysterectomy -- unknown type    . Shunt for syringomyelia 1987    . Temporal artery biopsy / ligation      Negative 2005  . Shoulder surgery    . Abdominal hysterectomy     Social History   Social History  . Marital Status: Married    Spouse Name: N/A  . Number of Children: 2  . Years of Education: college    Occupational History  . retired     Social History Main Topics  . Smoking status:  Former Smoker    Quit date: 04/26/1959  . Smokeless tobacco: None  . Alcohol Use: No  . Drug Use: No  . Sexual Activity: No   Other Topics Concern  . None   Social History Narrative   Patient lives with her husband    Patient is right handed   Patient drinks coffee daily   Family History  Problem Relation Age of Onset  . Heart disease    . Heart failure    . Heart disease Mother   . Heart failure Mother   . Arthritis Mother   . Heart disease Brother    Allergies  Allergen Reactions  . Aspirin Other (See Comments)    Feels like choking  . Sulfa Antibiotics     Unknown  . Bactrim Other (See Comments)    Unknown  . Crestor [Rosuvastatin] Other (See Comments)    Lower ext myalgia  . Niacin And Related Other (See Comments)    Turns red in the face  . Penicillins Other (See Comments)    Unknown  . Relafen [Nabumetone] Other (See Comments)    Unknown  . Tetracyclines & Related Rash   Prior to Admission  medications   Medication Sig Start Date End Date Taking? Authorizing Provider  Calcium Carbonate-Vitamin D (CALCIUM + D PO) Take 1 tablet by mouth every morning.    Yes Historical Provider, MD  Carboxymethylcellul-Glycerin (LUBRICATING EYE DROPS OP) Place 1 drop into both eyes 2 (two) times daily.   Yes Historical Provider, MD  cholecalciferol (VITAMIN D) 1000 UNITS tablet Take 1,000 Units by mouth daily.   Yes Historical Provider, MD  clopidogrel (PLAVIX) 75 MG tablet Take 1 tablet daily 09/05/15  Yes Darlyne Russian, MD  LIVALO 1 MG TABS TAKE 1 TABLET ONCE DAILY. 02/08/16  Yes Darlyne Russian, MD  LORazepam (ATIVAN) 0.5 MG tablet Take 1 tablet (0.5 mg total) by mouth at bedtime as needed. For sleep. 09/03/15  Yes Darlyne Russian, MD  metoprolol succinate (TOPROL-XL) 25 MG 24 hr tablet TAKE 1/2 TABLET DAILY. 02/13/16  Yes Darlyne Russian, MD  Multiple Vitamins-Minerals (MULTIVITAMIN PO) Take 1 tablet by mouth daily.    Yes Historical Provider, MD  polyethylene glycol powder  (GLYCOLAX/MIRALAX) powder Take 17 g by mouth 2 (two) times daily as needed. 03/07/15  Yes Roselee Culver, MD  ranitidine (ZANTAC) 150 MG tablet TAKE 1 TABLET TWICE DAILY. 09/05/15  Yes Darlyne Russian, MD   ROS: The patient denies fevers, chills, night sweats, unintentional weight loss, chest pain, palpitations, wheezing, dyspnea on exertion, nausea, vomiting, abdominal pain, dysuria, hematuria, melena, numbness, weakness, or tingling.   All other systems have been reviewed and were otherwise negative with the exception of those mentioned in the HPI and as above.    PHYSICAL EXAM: Filed Vitals:   03/01/16 1007  BP: 120/60  Pulse: 64  Temp: 97.5 F (36.4 C)  Resp: 16   Body mass index is 24.65 kg/(m^2).  General: Alert, no acute distress HEENT:  Normocephalic, atraumatic, oropharynx patent. Eye: Juliette Mangle [facility] Cardiovascular: Grade 2 systolic murmur at L sternal border. No pedal edema.  Respiratory: Clear to auscultation bilaterally.  No wheezes, rales, or rhonchi.  No cyanosis, no use of accessory musculature Abdominal: No organomegaly, abdomen is soft and non-tender, positive bowel sounds.  No masses. Musculoskeletal: Gait intact. No edema, tenderness Skin: No rashes. Neurologic: Facial musculature symmetric. Psychiatric: Patient acts appropriately throughout our interaction. Lymphatic: No cervical or submandibular lymphadenopathy  LABS:  EKG/XRAY:   Primary read interpreted by Dr. Everlene Farrier at [facility]  ASSESSMENT/PLAN: Patient stable hemoglobin A1c 6.1. She is trying to decide if she wants to switch over to [facility] or lumbar health care. No change in medications.  I personally performed the services described in this documentation, which was scribed in my presence. The recorded information has been reviewed and is accurate.  Gross sideeffects, risk and benefits, and alternatives of medications d/w patient. Patient is aware that all medications have potential sideeffects and we are  unable to predict every sideeffect or drug-drug interaction that may occur.  [name] MD 03/01/2016 10:17 AM

## 2016-03-07 ENCOUNTER — Other Ambulatory Visit: Payer: Self-pay | Admitting: Emergency Medicine

## 2016-04-19 ENCOUNTER — Telehealth: Payer: Self-pay | Admitting: Cardiology

## 2016-04-19 NOTE — Telephone Encounter (Signed)
Pt having her tooth extracted next week.She wants to know can she stop her Plavix and for how many days?

## 2016-04-19 NOTE — Telephone Encounter (Signed)
Returned call and spoke to patient. Inquiring about plavix hold for a single tooth extraction. Advised this is not usually necessary, per protocol no interruption to anticoag/antiplatelet meds for removal of single tooth. Informed her i would call Dr. Jeanice Lim office and clarify if the dentist was requesting the interruption in her meds. I called and spoke to Dr. Jeanice Lim assistant who was informed of recommendations per cardiology protocol and informed me she did not think this would be an issue. Aware to call if Dr. Karena Addison would prefer plavix hold and I could obtain clearance for this. They understand this would typically be a minimum 5 day hold of med.  Called pt back and explained rationale for no med changes. She voiced understanding and thanks.

## 2016-05-02 DIAGNOSIS — Z85828 Personal history of other malignant neoplasm of skin: Secondary | ICD-10-CM | POA: Diagnosis not present

## 2016-05-02 DIAGNOSIS — L570 Actinic keratosis: Secondary | ICD-10-CM | POA: Diagnosis not present

## 2016-05-02 DIAGNOSIS — L821 Other seborrheic keratosis: Secondary | ICD-10-CM | POA: Diagnosis not present

## 2016-05-08 DIAGNOSIS — M25562 Pain in left knee: Secondary | ICD-10-CM | POA: Diagnosis not present

## 2016-05-24 DIAGNOSIS — Z1389 Encounter for screening for other disorder: Secondary | ICD-10-CM | POA: Diagnosis not present

## 2016-05-24 DIAGNOSIS — G43909 Migraine, unspecified, not intractable, without status migrainosus: Secondary | ICD-10-CM | POA: Diagnosis not present

## 2016-05-24 DIAGNOSIS — I10 Essential (primary) hypertension: Secondary | ICD-10-CM | POA: Diagnosis not present

## 2016-05-24 DIAGNOSIS — I2510 Atherosclerotic heart disease of native coronary artery without angina pectoris: Secondary | ICD-10-CM | POA: Diagnosis not present

## 2016-05-24 DIAGNOSIS — I712 Thoracic aortic aneurysm, without rupture: Secondary | ICD-10-CM | POA: Diagnosis not present

## 2016-05-24 DIAGNOSIS — E784 Other hyperlipidemia: Secondary | ICD-10-CM | POA: Diagnosis not present

## 2016-05-24 DIAGNOSIS — G459 Transient cerebral ischemic attack, unspecified: Secondary | ICD-10-CM | POA: Diagnosis not present

## 2016-06-04 ENCOUNTER — Telehealth: Payer: Self-pay | Admitting: Cardiology

## 2016-06-06 NOTE — Telephone Encounter (Signed)
Closed encounter °

## 2016-06-12 ENCOUNTER — Ambulatory Visit: Payer: PPO | Admitting: Cardiology

## 2016-06-12 ENCOUNTER — Encounter: Payer: Self-pay | Admitting: Cardiology

## 2016-06-12 ENCOUNTER — Ambulatory Visit (INDEPENDENT_AMBULATORY_CARE_PROVIDER_SITE_OTHER): Payer: PPO | Admitting: Cardiology

## 2016-06-12 VITALS — BP 126/76 | HR 61 | Ht 60.6 in | Wt 128.4 lb

## 2016-06-12 DIAGNOSIS — E7800 Pure hypercholesterolemia, unspecified: Secondary | ICD-10-CM

## 2016-06-12 DIAGNOSIS — G44209 Tension-type headache, unspecified, not intractable: Secondary | ICD-10-CM | POA: Diagnosis not present

## 2016-06-12 DIAGNOSIS — I712 Thoracic aortic aneurysm, without rupture, unspecified: Secondary | ICD-10-CM

## 2016-06-12 DIAGNOSIS — I2584 Coronary atherosclerosis due to calcified coronary lesion: Secondary | ICD-10-CM

## 2016-06-12 DIAGNOSIS — I2510 Atherosclerotic heart disease of native coronary artery without angina pectoris: Secondary | ICD-10-CM

## 2016-06-12 NOTE — Patient Instructions (Addendum)
Medication Instructions:   Your physician recommends that you continue on your current medications as directed. Please refer to the Current Medication list given to you today.   If you need a refill on your cardiac medications before your next appointment, please call your pharmacy.  Labwork:  NONE ORDER TODAY    Testing/Procedures: NONE ORDER TODAY    Follow-Up:  Your physician wants you to follow-up in: ONE YEAR WITH  [place].Marland Kitchen You will receive a reminder letter in the mail two months in advance. If you don't receive a letter, please call our office to schedule the follow-up appointment.     Any Other Special Instructions Will Be Listed Below (If Applicable).

## 2016-06-12 NOTE — Progress Notes (Signed)
06/12/2016 [name]   03-Jul-1926  [id number]  Primary Physician Sheela Stack, MD Primary Cardiologist: Dr Martinique  HPI:  Pleasant 82 y/o female followed by Dr Martinique. Her daughter [name] is an APP at [employer] in [place]. The pt has a history of CAD- asymptomatic, and a thoracic aortic aneurysm. This was last evaluated June 2015- 4.4 cm.  At the [facility] Dr Martinique noted with mild growth over the past 13 years and he felt the risk of dissection was low. This in no longer being followed. She is in the office today for routine follow up. She has had no problems since Dr Martinique saw her last.    Current Outpatient Prescriptions  Medication Sig Dispense Refill  . Calcium Carbonate-Vitamin D (CALCIUM + D PO) Take 1 tablet by mouth every morning.     . Carboxymethylcellul-Glycerin (LUBRICATING EYE DROPS OP) Place 1 drop into both eyes 2 (two) times daily.    . cholecalciferol (VITAMIN D) 1000 UNITS tablet Take 1,000 Units by mouth daily.    . clopidogrel (PLAVIX) 75 MG tablet Take 75 mg by mouth daily.    Marland Kitchen LORazepam (ATIVAN) 0.5 MG tablet Take 1 tablet (0.5 mg total) by mouth at bedtime as needed. For sleep. 30 tablet 5  . metoprolol succinate (TOPROL-XL) 25 MG 24 hr tablet Take 25 mg by mouth daily.    . Multiple Vitamins-Minerals (MULTIVITAMIN PO) Take 1 tablet by mouth daily.     . Pitavastatin Calcium (LIVALO) 1 MG TABS Take 1 tablet by mouth daily.    . polyethylene glycol powder (GLYCOLAX/MIRALAX) powder Take 17 g by mouth 2 (two) times daily as needed. 3350 g 1  . ranitidine (ZANTAC) 150 MG tablet Take 150 mg by mouth 2 (two) times daily.     No current facility-administered medications for this visit.    Allergies  Allergen Reactions  . Aspirin Other (See Comments)    Feels like choking  . Sulfa Antibiotics     Unknown  . Bactrim Other (See Comments)    Unknown  . Crestor [Rosuvastatin] Other (See Comments)    Lower ext myalgia  . Niacin And Related  Other (See Comments)    Turns red in the face  . Penicillins Other (See Comments)    Unknown  . Relafen [Nabumetone] Other (See Comments)    Unknown  . Tetracyclines & Related Rash    Social History   Social History  . Marital Status: Married    Spouse Name: N/A  . Number of Children: 2  . Years of Education: college    Occupational History  . retired     Social History Main Topics  . Smoking status: Former Smoker    Quit date: 04/26/1959  . Smokeless tobacco: Not on file  . Alcohol Use: No  . Drug Use: No  . Sexual Activity: No   Other Topics Concern  . Not on file   Social History Narrative   Patient lives with her husband    Patient is right handed   Patient drinks coffee daily     Review of Systems: General: negative for chills, fever, night sweats or weight changes.  Cardiovascular: negative for chest pain, dyspnea on exertion, edema, orthopnea, palpitations, paroxysmal nocturnal dyspnea or shortness of breath Dermatological: negative for rash Respiratory: negative for cough or wheezing Urologic: negative for hematuria Abdominal: negative for nausea, vomiting, diarrhea, bright red blood per rectum, melena, or hematemesis Neurologic: negative for visual changes, syncope, or  dizziness All other systems reviewed and are otherwise negative except as noted above.    Blood pressure 126/76, pulse 61, height 5' 0.6" (1.539 m), weight 128 lb 6.4 oz (58.242 kg).  General appearance: alert, cooperative and no distress Neck: no carotid bruit and no JVD Lungs: clear to auscultation bilaterally Heart: regular rate and rhythm and soft systolic murmur LSB Extremities: no edema Skin: Skin color, texture, turgor normal. No rashes or lesions Neurologic: Grossly normal  EKG NSR, poor ant RW  ASSESSMENT AND PLAN:   Aortic aneurysm, thoracic This is no longer being followed   CAD (coronary artery disease) Moderate CAD at cath 2005, coronary CA++ noted on past chest  CTs, no angina  Hypercholesteremia On Livalo  Tension headache She has had chronic headaches, seen by Neurology in the past   PLAN  No change in Rx. She is changing PCP to Dr Forde Dandy, will send office notes to him. F/U Dr Martinique in a year.   Kerin Ransom PA-C 06/12/2016 10:33 AM

## 2016-06-12 NOTE — Assessment & Plan Note (Signed)
She has had chronic headaches, seen by Neurology in the past

## 2016-06-12 NOTE — Assessment & Plan Note (Signed)
This is no longer being followed

## 2016-06-12 NOTE — Assessment & Plan Note (Signed)
Moderate CAD at cath 2005, coronary CA++ noted on past chest CTs, no angina

## 2016-06-29 DIAGNOSIS — H02834 Dermatochalasis of left upper eyelid: Secondary | ICD-10-CM | POA: Diagnosis not present

## 2016-06-29 DIAGNOSIS — H04123 Dry eye syndrome of bilateral lacrimal glands: Secondary | ICD-10-CM | POA: Diagnosis not present

## 2016-06-29 DIAGNOSIS — H40053 Ocular hypertension, bilateral: Secondary | ICD-10-CM | POA: Diagnosis not present

## 2016-06-29 DIAGNOSIS — Z961 Presence of intraocular lens: Secondary | ICD-10-CM | POA: Diagnosis not present

## 2016-06-29 DIAGNOSIS — H02831 Dermatochalasis of right upper eyelid: Secondary | ICD-10-CM | POA: Diagnosis not present

## 2016-06-29 DIAGNOSIS — H353131 Nonexudative age-related macular degeneration, bilateral, early dry stage: Secondary | ICD-10-CM | POA: Diagnosis not present

## 2016-07-30 ENCOUNTER — Telehealth: Payer: Self-pay | Admitting: Cardiology

## 2016-07-30 NOTE — Telephone Encounter (Signed)
Returned call to patient.Stated she had a episode of fast heart beat last week while at grocery store.Stated episode lasted appox 1 to 2 min.Stated no chest pain,no sob.Stated her brother and sister both have AFib.She has been under a lot of stress with her husband who has been having health problems.Stated her daughter who is a PA wanted her to call and let Dr.Jordan know.Advised Dr.Jordan out of office this afternoon.Dr.Jordan had a cancellation 07/31/16.Appointment scheduled with him 07/31/16 at 3:15 pm.

## 2016-07-30 NOTE — Telephone Encounter (Signed)
New message     The pt states last week she had a fast heart rate last about 2-3 mins, then it went away    The pt states she had a couple of episode the day before in the grocery store last a few seconds.   Pt c/o BP issue: STAT if pt c/o blurred vision, one-sided weakness or slurred speech  1. What are your last 5 BP readings? The pt took her heart rate it was 94 last week after 2. Are you having any other symptoms (ex. Dizziness, headache, blurred vision, passed out)? no  3. What is your BP issue? The pt states she is fine

## 2016-07-31 ENCOUNTER — Ambulatory Visit (INDEPENDENT_AMBULATORY_CARE_PROVIDER_SITE_OTHER): Payer: PPO | Admitting: Cardiology

## 2016-07-31 ENCOUNTER — Encounter: Payer: Self-pay | Admitting: Cardiology

## 2016-07-31 VITALS — BP 94/58 | HR 61 | Ht 61.5 in | Wt 130.4 lb

## 2016-07-31 DIAGNOSIS — R002 Palpitations: Secondary | ICD-10-CM | POA: Insufficient documentation

## 2016-07-31 DIAGNOSIS — E7800 Pure hypercholesterolemia, unspecified: Secondary | ICD-10-CM | POA: Diagnosis not present

## 2016-07-31 DIAGNOSIS — I712 Thoracic aortic aneurysm, without rupture, unspecified: Secondary | ICD-10-CM

## 2016-07-31 DIAGNOSIS — I2584 Coronary atherosclerosis due to calcified coronary lesion: Secondary | ICD-10-CM

## 2016-07-31 DIAGNOSIS — I2510 Atherosclerotic heart disease of native coronary artery without angina pectoris: Secondary | ICD-10-CM | POA: Diagnosis not present

## 2016-07-31 NOTE — Patient Instructions (Signed)
Continue your current therapy  Let me know if your heart racing increases in frequency or severity  I will see you in one year

## 2016-07-31 NOTE — Progress Notes (Signed)
[name] Date of Birth: Oct 07, 1926 Medical Record [id number]  History of Present Illness: [name] is seen today for evaluation of palpitations. She has a history of ascending aortic ectasia and mild aneurysm.  CT of the chest in 2002 showed mild aortic ectasia measured at 3.7 cm. She had some coronary calcification consistent with atherosclerosis. She had a Myoview study which was normal. She has a history of prior cardiac catheterization in 2005 which showed mild nonobstructive disease. CT in 2013 showed a maximal aortic diameter of 4.2 cm and more recent CT in June 2015 it was 4.4 cm. She remains on Plavix and metoprolol therapy. She remains active. She was intolerant to lipitor but is tolerating Livalo at 1 mg daily.  Echo In July 2015 was unremarkable.  She is seen today for evaluation of rapid heart beat. Normally her HR is 60. Last week she noted her HR to increase to 95. This lasted about 6 minutes. Felt a pounding and racing. Denied any SOB or chest pain. States she has this a few times over the years but it is rare. She has been under a lot of stress with her husband being wheelchair bound since June.   Current Outpatient Prescriptions on File Prior to Visit  Medication Sig Dispense Refill  . Calcium Carbonate-Vitamin D (CALCIUM + D PO) Take 1 tablet by mouth every morning.     . Carboxymethylcellul-Glycerin (LUBRICATING EYE DROPS OP) Place 1 drop into both eyes 2 (two) times daily.    . cholecalciferol (VITAMIN D) 1000 UNITS tablet Take 1,000 Units by mouth daily.    . clopidogrel (PLAVIX) 75 MG tablet Take 75 mg by mouth daily.    Marland Kitchen LORazepam (ATIVAN) 0.5 MG tablet Take 1 tablet (0.5 mg total) by mouth at bedtime as needed. For sleep. 30 tablet 5  . metoprolol succinate (TOPROL-XL) 25 MG 24 hr tablet Take 25 mg by mouth daily.    . Multiple Vitamins-Minerals (MULTIVITAMIN PO) Take 1 tablet by mouth daily.     . Pitavastatin Calcium (LIVALO) 1 MG TABS Take 1 tablet by  mouth daily.    . polyethylene glycol powder (GLYCOLAX/MIRALAX) powder Take 17 g by mouth 2 (two) times daily as needed. 3350 g 1  . ranitidine (ZANTAC) 150 MG tablet Take 150 mg by mouth 2 (two) times daily.     No current facility-administered medications on file prior to visit.     Allergies  Allergen Reactions  . Aspirin Other (See Comments)    Feels like choking  . Sulfa Antibiotics     Unknown  . Bactrim Other (See Comments)    Unknown  . Crestor [Rosuvastatin] Other (See Comments)    Lower ext myalgia  . Niacin And Related Other (See Comments)    Turns red in the face  . Penicillins Other (See Comments)    Unknown  . Relafen [Nabumetone] Other (See Comments)    Unknown  . Tetracyclines & Related Rash    Past Medical History:  Diagnosis Date  . Aortic aneurysm, thoracic (HCC)    4.4 cm  . Aortic dilatation ([place])    Seen on cath 2005  . Arthritis   . CAD (coronary artery disease)    a. Nonobstructive by last cath in 05/2004 with 30% LAD. b. EF 65-70% 12/2010. c. Hx ASA intolerance.   . Colitis    12/2010 associated with transient lower GI bleeding/anemia tx with abx  . GERD (gastroesophageal reflux disease)    a. Minimal  hiatal hernia on EGD 2006, no obvious esophageal source for CP at that time.  . Hypercholesteremia    Hx of myalgias felt to be statin related but taking Crestor as OP  . Hypertension   . Osteoporosis     Past Surgical History:  Procedure Laterality Date  . ABDOMINAL HYSTERECTOMY    . BACK SURGERY     Disc disease  . hysterectomy -- unknown type    . SHOULDER SURGERY    . Shunt for syringomyelia 1987    . TEMPORAL ARTERY BIOPSY / LIGATION     Negative 2005    History  Smoking Status  . Former Smoker  . Quit date: 04/26/1959  Smokeless Tobacco  . Not on file    History  Alcohol Use No    Family History  Problem Relation Age of Onset  . Heart disease    . Heart failure    . Heart disease Mother   . Heart failure Mother   .  Arthritis Mother   . Heart disease Brother     Review of Systems: As noted in history of present illness.   All other systems were reviewed and are negative.  Physical Exam: BP (!) 94/58   Pulse 61   Ht 5' 1.5" (1.562 m)   Wt 130 lb 6.4 oz (59.1 kg)   BMI 24.24 kg/m  She is a pleasant elderly white female in no acute distress. HEENT exam is unremarkable. She has no JVD, adenopathy, thyromegaly, or bruits. Lungs are clear. Cardiovascular regular rate and rhythm, normal S1 and S2. Grade 2/6 systolic murmur in the RUSB Abdomen is soft and nontender without masses or bruits. Extremities are without clubbing or edema. Pedal pulses are good. Neurologic exam she is alert and oriented x3. Cranial nerves II through XII are intact.  LABORATORY DATA:   Lab Results  Component Value Date   WBC 6.2 09/08/2015   HGB 14.0 09/08/2015   HCT 39.8 09/08/2015   PLT 211 09/08/2015   GLUCOSE 100 (H) 03/01/2016   CHOL 200 03/01/2016   TRIG 138 03/01/2016   HDL 66 03/01/2016   LDLCALC 106 03/01/2016   ALT 10 09/08/2015   AST 22 09/08/2015   NA 141 03/01/2016   K 4.3 03/01/2016   CL 103 03/01/2016   CREATININE 0.60 03/01/2016   BUN 16 03/01/2016   CO2 32 (H) 03/01/2016   TSH 3.334 07/29/2012   INR 1.05 07/30/2014   HGBA1C 6.1 03/01/2016   Ecg today shows NSR with LAFB and poor R wave progression in the precordial leads. No acute change. I have personally reviewed and interpreted this study.  Assessment / Plan: 1. History of Nonobstructive coronary disease with coronary calcification. Patient is  asymptomatic.  Myoview study July 2013 was normal. Continue risk factor modification.   2. Thoracic aortic aneurysm. 4.4 cm with mild growth over the past 13 years. Risk of dissection is low. BP control is good. She is not interested in any further follow up and I think it is unlikely that this will ever cause any problems.  3. History of sinus bradycardia. HR Ok on current metoprolol dose.  4.  Palpitations. No documented arrhythmia. Given rarity of symptoms it will be difficult to catch. Recommend she continue current beta blocker dose. Avoid stimulants. If symptoms become more frequent or sustained then we will have her wear and event monitor. Stress may be playing a role.

## 2016-10-01 ENCOUNTER — Other Ambulatory Visit: Payer: Self-pay | Admitting: Emergency Medicine

## 2016-10-02 ENCOUNTER — Other Ambulatory Visit: Payer: Self-pay | Admitting: Emergency Medicine

## 2016-10-12 ENCOUNTER — Other Ambulatory Visit: Payer: Self-pay | Admitting: Emergency Medicine

## 2016-11-12 DIAGNOSIS — Z1231 Encounter for screening mammogram for malignant neoplasm of breast: Secondary | ICD-10-CM | POA: Diagnosis not present

## 2016-11-20 DIAGNOSIS — E784 Other hyperlipidemia: Secondary | ICD-10-CM | POA: Diagnosis not present

## 2016-11-20 DIAGNOSIS — I10 Essential (primary) hypertension: Secondary | ICD-10-CM | POA: Diagnosis not present

## 2016-11-27 DIAGNOSIS — M810 Age-related osteoporosis without current pathological fracture: Secondary | ICD-10-CM | POA: Diagnosis not present

## 2016-11-27 DIAGNOSIS — I779 Disorder of arteries and arterioles, unspecified: Secondary | ICD-10-CM | POA: Diagnosis not present

## 2016-11-27 DIAGNOSIS — E784 Other hyperlipidemia: Secondary | ICD-10-CM | POA: Diagnosis not present

## 2016-11-27 DIAGNOSIS — I2510 Atherosclerotic heart disease of native coronary artery without angina pectoris: Secondary | ICD-10-CM | POA: Diagnosis not present

## 2016-11-27 DIAGNOSIS — Z1389 Encounter for screening for other disorder: Secondary | ICD-10-CM | POA: Diagnosis not present

## 2016-11-27 DIAGNOSIS — Z6825 Body mass index (BMI) 25.0-25.9, adult: Secondary | ICD-10-CM | POA: Diagnosis not present

## 2016-11-27 DIAGNOSIS — G458 Other transient cerebral ischemic attacks and related syndromes: Secondary | ICD-10-CM | POA: Diagnosis not present

## 2016-11-27 DIAGNOSIS — I10 Essential (primary) hypertension: Secondary | ICD-10-CM | POA: Diagnosis not present

## 2016-11-27 DIAGNOSIS — K219 Gastro-esophageal reflux disease without esophagitis: Secondary | ICD-10-CM | POA: Diagnosis not present

## 2016-11-27 DIAGNOSIS — I712 Thoracic aortic aneurysm, without rupture: Secondary | ICD-10-CM | POA: Diagnosis not present

## 2016-11-27 DIAGNOSIS — Z0000 Encounter for general adult medical examination without abnormal findings: Secondary | ICD-10-CM | POA: Diagnosis not present

## 2016-11-27 DIAGNOSIS — R7301 Impaired fasting glucose: Secondary | ICD-10-CM | POA: Diagnosis not present

## 2017-01-09 DIAGNOSIS — M545 Low back pain: Secondary | ICD-10-CM | POA: Diagnosis not present

## 2017-01-30 DIAGNOSIS — N3941 Urge incontinence: Secondary | ICD-10-CM | POA: Diagnosis not present

## 2017-01-30 DIAGNOSIS — Z87440 Personal history of urinary (tract) infections: Secondary | ICD-10-CM | POA: Diagnosis not present

## 2017-04-04 DIAGNOSIS — M25552 Pain in left hip: Secondary | ICD-10-CM | POA: Diagnosis not present

## 2017-04-04 DIAGNOSIS — S32030A Wedge compression fracture of third lumbar vertebra, initial encounter for closed fracture: Secondary | ICD-10-CM | POA: Diagnosis not present

## 2017-04-04 DIAGNOSIS — M545 Low back pain: Secondary | ICD-10-CM | POA: Diagnosis not present

## 2017-04-04 DIAGNOSIS — W1830XA Fall on same level, unspecified, initial encounter: Secondary | ICD-10-CM | POA: Diagnosis not present

## 2017-04-04 DIAGNOSIS — S79912A Unspecified injury of left hip, initial encounter: Secondary | ICD-10-CM | POA: Diagnosis not present

## 2017-04-04 DIAGNOSIS — M5136 Other intervertebral disc degeneration, lumbar region: Secondary | ICD-10-CM | POA: Diagnosis not present

## 2017-04-04 DIAGNOSIS — S3992XA Unspecified injury of lower back, initial encounter: Secondary | ICD-10-CM | POA: Diagnosis not present

## 2017-04-04 DIAGNOSIS — S300XXA Contusion of lower back and pelvis, initial encounter: Secondary | ICD-10-CM | POA: Diagnosis not present

## 2017-04-08 DIAGNOSIS — M545 Low back pain: Secondary | ICD-10-CM | POA: Diagnosis not present

## 2017-04-23 DIAGNOSIS — M545 Low back pain: Secondary | ICD-10-CM | POA: Diagnosis not present

## 2017-05-21 DIAGNOSIS — M545 Low back pain: Secondary | ICD-10-CM | POA: Diagnosis not present

## 2017-05-25 DIAGNOSIS — M549 Dorsalgia, unspecified: Secondary | ICD-10-CM | POA: Diagnosis not present

## 2017-05-28 DIAGNOSIS — I712 Thoracic aortic aneurysm, without rupture: Secondary | ICD-10-CM | POA: Diagnosis not present

## 2017-05-28 DIAGNOSIS — E784 Other hyperlipidemia: Secondary | ICD-10-CM | POA: Diagnosis not present

## 2017-05-28 DIAGNOSIS — I10 Essential (primary) hypertension: Secondary | ICD-10-CM | POA: Diagnosis not present

## 2017-05-28 DIAGNOSIS — G459 Transient cerebral ischemic attack, unspecified: Secondary | ICD-10-CM | POA: Diagnosis not present

## 2017-05-28 DIAGNOSIS — M5416 Radiculopathy, lumbar region: Secondary | ICD-10-CM | POA: Diagnosis not present

## 2017-05-28 DIAGNOSIS — I2510 Atherosclerotic heart disease of native coronary artery without angina pectoris: Secondary | ICD-10-CM | POA: Diagnosis not present

## 2017-05-28 DIAGNOSIS — R7301 Impaired fasting glucose: Secondary | ICD-10-CM | POA: Diagnosis not present

## 2017-05-28 DIAGNOSIS — Z6823 Body mass index (BMI) 23.0-23.9, adult: Secondary | ICD-10-CM | POA: Diagnosis not present

## 2017-05-28 DIAGNOSIS — I779 Disorder of arteries and arterioles, unspecified: Secondary | ICD-10-CM | POA: Diagnosis not present

## 2017-05-28 DIAGNOSIS — Z1389 Encounter for screening for other disorder: Secondary | ICD-10-CM | POA: Diagnosis not present

## 2017-06-10 DIAGNOSIS — M79662 Pain in left lower leg: Secondary | ICD-10-CM | POA: Diagnosis not present

## 2017-06-10 DIAGNOSIS — M4727 Other spondylosis with radiculopathy, lumbosacral region: Secondary | ICD-10-CM | POA: Diagnosis not present

## 2017-06-10 DIAGNOSIS — I824Z2 Acute embolism and thrombosis of unspecified deep veins of left distal lower extremity: Secondary | ICD-10-CM | POA: Diagnosis not present

## 2017-06-11 DIAGNOSIS — R102 Pelvic and perineal pain: Secondary | ICD-10-CM | POA: Diagnosis not present

## 2017-06-11 DIAGNOSIS — M25552 Pain in left hip: Secondary | ICD-10-CM | POA: Diagnosis not present

## 2017-06-19 DIAGNOSIS — J069 Acute upper respiratory infection, unspecified: Secondary | ICD-10-CM | POA: Diagnosis not present

## 2017-07-18 DIAGNOSIS — L821 Other seborrheic keratosis: Secondary | ICD-10-CM | POA: Diagnosis not present

## 2017-07-18 DIAGNOSIS — Q828 Other specified congenital malformations of skin: Secondary | ICD-10-CM | POA: Diagnosis not present

## 2017-07-18 DIAGNOSIS — L570 Actinic keratosis: Secondary | ICD-10-CM | POA: Diagnosis not present

## 2017-07-18 DIAGNOSIS — Z85828 Personal history of other malignant neoplasm of skin: Secondary | ICD-10-CM | POA: Diagnosis not present

## 2017-07-31 DIAGNOSIS — H60392 Other infective otitis externa, left ear: Secondary | ICD-10-CM | POA: Diagnosis not present

## 2017-07-31 DIAGNOSIS — H66002 Acute suppurative otitis media without spontaneous rupture of ear drum, left ear: Secondary | ICD-10-CM | POA: Diagnosis not present

## 2017-08-20 DIAGNOSIS — H02834 Dermatochalasis of left upper eyelid: Secondary | ICD-10-CM | POA: Diagnosis not present

## 2017-08-20 DIAGNOSIS — H02831 Dermatochalasis of right upper eyelid: Secondary | ICD-10-CM | POA: Diagnosis not present

## 2017-08-20 DIAGNOSIS — Z961 Presence of intraocular lens: Secondary | ICD-10-CM | POA: Diagnosis not present

## 2017-08-20 DIAGNOSIS — H353131 Nonexudative age-related macular degeneration, bilateral, early dry stage: Secondary | ICD-10-CM | POA: Diagnosis not present

## 2017-08-20 DIAGNOSIS — H04123 Dry eye syndrome of bilateral lacrimal glands: Secondary | ICD-10-CM | POA: Diagnosis not present

## 2017-08-20 DIAGNOSIS — H40053 Ocular hypertension, bilateral: Secondary | ICD-10-CM | POA: Diagnosis not present

## 2017-09-03 ENCOUNTER — Encounter (HOSPITAL_COMMUNITY): Payer: Self-pay | Admitting: Emergency Medicine

## 2017-09-03 ENCOUNTER — Observation Stay (HOSPITAL_COMMUNITY)
Admission: EM | Admit: 2017-09-03 | Discharge: 2017-09-04 | Disposition: A | Discharge status: Home / Self Care | Payer: PPO | Attending: Internal Medicine | Admitting: Internal Medicine

## 2017-09-03 ENCOUNTER — Emergency Department (HOSPITAL_COMMUNITY): Payer: PPO

## 2017-09-03 DIAGNOSIS — Z87891 Personal history of nicotine dependence: Secondary | ICD-10-CM | POA: Insufficient documentation

## 2017-09-03 DIAGNOSIS — K219 Gastro-esophageal reflux disease without esophagitis: Secondary | ICD-10-CM | POA: Insufficient documentation

## 2017-09-03 DIAGNOSIS — R0789 Other chest pain: Principal | ICD-10-CM | POA: Insufficient documentation

## 2017-09-03 DIAGNOSIS — Z7902 Long term (current) use of antithrombotics/antiplatelets: Secondary | ICD-10-CM | POA: Diagnosis not present

## 2017-09-03 DIAGNOSIS — E7800 Pure hypercholesterolemia, unspecified: Secondary | ICD-10-CM | POA: Diagnosis not present

## 2017-09-03 DIAGNOSIS — R079 Chest pain, unspecified: Secondary | ICD-10-CM | POA: Diagnosis not present

## 2017-09-03 DIAGNOSIS — Z8673 Personal history of transient ischemic attack (TIA), and cerebral infarction without residual deficits: Secondary | ICD-10-CM | POA: Insufficient documentation

## 2017-09-03 DIAGNOSIS — I6789 Other cerebrovascular disease: Secondary | ICD-10-CM | POA: Diagnosis not present

## 2017-09-03 DIAGNOSIS — I2510 Atherosclerotic heart disease of native coronary artery without angina pectoris: Secondary | ICD-10-CM | POA: Insufficient documentation

## 2017-09-03 DIAGNOSIS — I700 Atherosclerosis of aorta: Secondary | ICD-10-CM | POA: Insufficient documentation

## 2017-09-03 DIAGNOSIS — I119 Hypertensive heart disease without heart failure: Secondary | ICD-10-CM | POA: Diagnosis not present

## 2017-09-03 DIAGNOSIS — Z79899 Other long term (current) drug therapy: Secondary | ICD-10-CM | POA: Diagnosis not present

## 2017-09-03 DIAGNOSIS — R0602 Shortness of breath: Secondary | ICD-10-CM | POA: Diagnosis not present

## 2017-09-03 DIAGNOSIS — R200 Anesthesia of skin: Secondary | ICD-10-CM | POA: Diagnosis not present

## 2017-09-03 DIAGNOSIS — Z880 Allergy status to penicillin: Secondary | ICD-10-CM | POA: Insufficient documentation

## 2017-09-03 DIAGNOSIS — I712 Thoracic aortic aneurysm, without rupture: Secondary | ICD-10-CM | POA: Diagnosis not present

## 2017-09-03 LAB — BASIC METABOLIC PANEL
Anion gap: 9 (ref 5–15)
BUN: 13 mg/dL (ref 6–20)
CALCIUM: 9.2 mg/dL (ref 8.9–10.3)
CHLORIDE: 104 mmol/L (ref 101–111)
CO2: 28 mmol/L (ref 22–32)
CREATININE: 0.72 mg/dL (ref 0.44–1.00)
GFR calc non Af Amer: >60 mL/min (ref >60)
GLUCOSE: 166 mg/dL — AB (ref 65–99)
Potassium: 3.5 mmol/L (ref 3.5–5.1)
Sodium: 141 mmol/L (ref 135–145)

## 2017-09-03 LAB — CBC
HCT: 39.8 % (ref 36.0–46.0)
Hemoglobin: 13.1 g/dL (ref 12.0–15.0)
MCH: 31.1 pg (ref 26.0–34.0)
MCHC: 32.9 g/dL (ref 30.0–36.0)
MCV: 94.5 fL (ref 78.0–100.0)
PLATELETS: 201 10*3/uL (ref 150–400)
RBC: 4.21 MIL/uL (ref 3.87–5.11)
RDW: 12.5 % (ref 11.5–15.5)
WBC: 6.3 10*3/uL (ref 4.0–10.5)

## 2017-09-03 LAB — I-STAT TROPONIN, ED: Troponin i, poc: 0 ng/mL (ref 0.00–0.08)

## 2017-09-03 MED ORDER — ACETAMINOPHEN 325 MG PO TABS
650.0000 mg | ORAL_TABLET | ORAL | Status: DC | PRN
  Administered 2017-09-03: 650 mg via ORAL
  Filled 2017-09-03: qty 2

## 2017-09-03 MED ORDER — MORPHINE SULFATE (PF) 4 MG/ML IV SOLN: 2.0000 mg | INTRAVENOUS | Status: DC | PRN

## 2017-09-03 MED ORDER — ONDANSETRON HCL 4 MG/2ML IJ SOLN
4.0000 mg | Freq: Four times a day (QID) | INTRAMUSCULAR | Status: DC | PRN
Start: 2017-09-03 — End: 2017-09-04

## 2017-09-03 MED ORDER — NITROGLYCERIN 0.4 MG SL SUBL
SUBLINGUAL_TABLET | SUBLINGUAL | Status: AC
  Administered 2017-09-03: 0.4 mg via SUBLINGUAL
  Filled 2017-09-03: qty 1

## 2017-09-03 MED ORDER — NITROGLYCERIN 0.4 MG SL SUBL
0.4000 mg | SUBLINGUAL_TABLET | SUBLINGUAL | Status: DC | PRN
  Administered 2017-09-03 (×2): 0.4 mg via SUBLINGUAL
  Filled 2017-09-03: qty 1

## 2017-09-03 MED ORDER — PRAVASTATIN SODIUM 40 MG PO TABS: 20.0000 mg | ORAL_TABLET | Freq: Every day | ORAL | Status: DC

## 2017-09-03 MED ORDER — FAMOTIDINE 20 MG PO TABS: 20.0000 mg | ORAL_TABLET | Freq: Every day | ORAL | Status: DC

## 2017-09-03 MED ORDER — CLOPIDOGREL BISULFATE 75 MG PO TABS
75.0000 mg | ORAL_TABLET | Freq: Every day | ORAL | Status: DC
  Administered 2017-09-04: 75 mg via ORAL
  Filled 2017-09-03: qty 1

## 2017-09-03 MED ORDER — VITAMIN D 1000 UNITS PO TABS
1000.0000 [IU] | ORAL_TABLET | Freq: Every day | ORAL | Status: DC
  Administered 2017-09-04: 1000 [IU] via ORAL
  Filled 2017-09-03: qty 1

## 2017-09-03 MED ORDER — LORAZEPAM 0.5 MG PO TABS: 0.5000 mg | ORAL_TABLET | Freq: Every evening | ORAL | Status: DC | PRN

## 2017-09-03 MED ORDER — POLYETHYLENE GLYCOL 3350 17 GM/SCOOP PO POWD
17.0000 g | Freq: Two times a day (BID) | ORAL | Status: DC | PRN
  Filled 2017-09-03: qty 255

## 2017-09-03 MED ORDER — ENOXAPARIN SODIUM 40 MG/0.4ML ~~LOC~~ SOLN
40.0000 mg | SUBCUTANEOUS | Status: DC
  Filled 2017-09-03: qty 0.4

## 2017-09-03 MED ORDER — METOPROLOL SUCCINATE ER 25 MG PO TB24
25.0000 mg | ORAL_TABLET | Freq: Every day | ORAL | Status: DC
  Administered 2017-09-04: 25 mg via ORAL
  Filled 2017-09-03: qty 1

## 2017-09-03 MED ORDER — GI COCKTAIL ~~LOC~~: 30.0000 mL | Freq: Three times a day (TID) | ORAL | Status: DC | PRN

## 2017-09-03 NOTE — ED Provider Notes (Signed)
The patient is an 83 year old female, presents to the hospital with a complaint of chest discomfort and left arm numbness which occurred earlier this evening and this afternoon. She was standing on her porch getting ready to go out to get the mail, she developed acute onset of heaviness and squeezing in her chest which was quite severe and was involved with some left-sided arm numbness from her fingertips to her shoulder. She has never had these symptoms in the past, she does not have any history of significant exertion and thus has no history of exertional symptoms. She has had multiple heart evaluations in the past most recently with a catheterization in 2012 showing approximately 50% lesion in the left anterior descending artery. The patient states that her symptoms lasted approximately 40 minutes after which time he gradually improved and have now gone away and dissipated. She no longer has any symptoms. She does report that she had mild shortness of breath with this but denies nausea vomiting diaphoresis or swelling of the legs.  On exam the patient has clear heart and lung sounds, there is a very soft systolic ejection murmur in early systole, she has no peripheral edema, no JVD, soft abdomen, clear lung sounds and is in no distress.  EKG reviewed, showing no acute findings or acute changes  Labs and chest x-ray been ordered  The patient likely needs to be admitted for observation and a cardiac rule out given her underlying history and her symptoms which were quite symptomatically seeming. That being said I see no signs of ST elevation or depression on the EKG to necessitate a cardiology consultation emergently.  The pt takes Plavix - can't take ASA - she doesn't know why she takes plavix.   EKG Interpretation  Date/Time:  Tuesday September 03 2017 22:53:49 EDT Ventricular Rate:  69 PR Interval:    QRS Duration: 86 QT Interval:  408 QTC Calculation: 438 R Axis:   -32 Text Interpretation:   Sinus rhythm Atrial premature complex Left axis deviation Low voltage, precordial leads Consider anterior infarct No significant change since last tracing Confirmed by Isla Pence [phone]) on 09/04/2017 1:44:59 PM        Medical screening examination/treatment/procedure(s) were conducted as a shared visit with non-physician practitioner(s) and myself.  I personally evaluated the patient during the encounter.  Clinical Impression:   Final diagnoses:  Chest pain, unspecified type         Noemi Chapel, MD 09/04/17 1535

## 2017-09-03 NOTE — ED Notes (Signed)
Pt denies chest pain but is now having a mild headache along with dizziness

## 2017-09-03 NOTE — Progress Notes (Signed)
CP resolved after 2nd NTG, now just has headache / dizziness.  Has h/o "headaches all my life".  SBP 127  Trying tylenol for likely migraine.

## 2017-09-03 NOTE — ED Notes (Signed)
Pt ambulatory to the restroom without difficulty.

## 2017-09-03 NOTE — ED Provider Notes (Signed)
[facility] DEPT Provider Note   CSN: [id number] Arrival date & time: 09/03/17  2003     History   Chief Complaint Chief Complaint  Patient presents with  . Numbness  . Chest Pain    HPI [name] is a 83 y.o. female.  HPI  [name] is an 83yo female with a history of HLD, TIA, CAD who presents to the Emergency Department via EMS for evaluation of "I think something is wrong with my heart." Patient states that around 6:30 this evening she walked out on the porch and felt a sudden chest pressure and left arm numbness. She also endorses some shortness of breath at the time and maybe some jaw pain. She states that she immediately walked inside and checked her blood pressure which was 140/90. She states that this is atypical for her as her blood pressure typically runs low. She called EMS and said that she started to feel better on the ride over here. She estimates symptoms lasted about 1hr total. At this time she says that she feels "normal" and wants to go home. She denies dysarthria, dysphagia, facial droop, arm weakness, leg weakness, abdominal pain, N/V/D. Her last cardiac cath was in [address] which showed 30-50% narrowing in the midportion of the LAD. She also has thoracic aortic aneurysm which was 4.4cm in June, 2015.   Past Medical History:  Diagnosis Date  . Aortic aneurysm, thoracic (HCC)    4.4 cm  . Aortic dilatation ([place])    Seen on cath 2005  . Arthritis   . CAD (coronary artery disease)    a. Nonobstructive by last cath in 05/2004 with 30% LAD. b. EF 65-70% 12/2010. c. Hx ASA intolerance.   . Colitis    12/2010 associated with transient lower GI bleeding/anemia tx with abx  . GERD (gastroesophageal reflux disease)    a. Minimal hiatal hernia on EGD 2006, no obvious esophageal source for CP at that time.  . Hypercholesteremia    Hx of myalgias felt to be statin related but taking Crestor as OP  . Hypertension   . Osteoporosis     Patient Active Problem  List   Diagnosis Date Noted  . Chest pain, rule out acute myocardial infarction 09/03/2017  . Heart palpitations 07/31/2016  . Neck pain, musculoskeletal 04/26/2015  . Variants of migraine, not elsewhere classified, without mention of intractable migraine without mention of status migrainosus 08/17/2014  . Tension headache 08/17/2014  . TIA (transient ischemic attack) 07/30/2014  . HTN (hypertension) 07/30/2014  . Aortic aneurysm, thoracic ([place]) 06/25/2014  . Chest pain 07/29/2012  . CAD (coronary artery disease) 06/03/2012  . Hypercholesteremia   . GERD (gastroesophageal reflux disease)     Past Surgical History:  Procedure Laterality Date  . ABDOMINAL HYSTERECTOMY    . BACK SURGERY     Disc disease  . hysterectomy -- unknown type    . SHOULDER SURGERY    . Shunt for syringomyelia 1987    . TEMPORAL ARTERY BIOPSY / LIGATION     Negative 2005    OB History    No data available       Home Medications    Prior to Admission medications   Medication Sig Start Date End Date Taking? Authorizing Provider  Calcium Carbonate-Vitamin D (CALCIUM + D PO) Take 1 tablet by mouth every morning.    Yes [provider]  Carboxymethylcellul-Glycerin (LUBRICATING EYE DROPS OP) Place 1 drop into both eyes 2 (two) times daily.  Yes [provider]  cholecalciferol (VITAMIN D) 1000 UNITS tablet Take 1,000 Units by mouth daily.   Yes [provider]  clopidogrel (PLAVIX) 75 MG tablet Take 75 mg by mouth daily.   Yes [provider]  LORazepam (ATIVAN) 0.5 MG tablet Take 1 tablet (0.5 mg total) by mouth at bedtime as needed. For sleep. 09/03/15  Yes Darlyne Russian, MD  metoprolol succinate (TOPROL-XL) 25 MG 24 hr tablet Take 25 mg by mouth daily.   Yes [provider]  Multiple Vitamins-Minerals (MULTIVITAMIN PO) Take 1 tablet by mouth daily.    Yes [provider]  Pitavastatin Calcium (LIVALO) 1 MG TABS Take 1 tablet by mouth daily.   Yes  [provider]  polyethylene glycol powder (GLYCOLAX/MIRALAX) powder Take 17 g by mouth 2 (two) times daily as needed. Patient taking differently: Take 17 g by mouth 2 (two) times daily as needed for mild constipation.  03/07/15  Yes Roselee Culver, MD  ranitidine (ZANTAC) 150 MG tablet Take 150 mg by mouth at bedtime.    Yes [provider]    Family History Family History  Problem Relation Age of Onset  . Heart disease Mother   . Heart failure Mother   . Arthritis Mother   . Heart disease Brother   . Heart disease Unknown   . Heart failure Unknown     Social History Social History  Substance Use Topics  . Smoking status: Former Smoker    Quit date: 04/26/1959  . Smokeless tobacco: Never Used  . Alcohol use No     Allergies   Aspirin; Sulfa antibiotics; Bactrim; Crestor [rosuvastatin]; Niacin and related; Penicillins; Relafen [nabumetone]; and Tetracyclines & related   Review of Systems Review of Systems  Constitutional: Negative for chills, fatigue and fever.  HENT: Negative for hearing loss and trouble swallowing.   Eyes: Negative for visual disturbance.  Respiratory: Negative for cough, shortness of breath (At 6:30 this evening, no longer feels this way) and wheezing.   Cardiovascular: Negative for chest pain, palpitations and leg swelling.  Gastrointestinal: Negative for abdominal pain, diarrhea, nausea and vomiting.  Genitourinary: Negative for dysuria.  Musculoskeletal: Negative for myalgias.  Skin: Negative for wound.  Neurological: Negative for dizziness, speech difficulty, weakness, light-headedness and numbness (At 6:30 this evening, no longer c/o this).  Psychiatric/Behavioral: Negative for agitation.     Physical Exam Updated Vital Signs BP (!) 134/59 (BP Location: Right Arm)   Pulse 65   Temp 97.6 F (36.4 C) (Oral)   Resp (!) 22   Ht 5' (1.524 m)   Wt 56.1 kg (123 lb 9.6 oz) Comment: scale b  SpO2 97%   BMI 24.14 kg/m    Physical Exam  Constitutional: She is oriented to person, place, and time. She appears well-developed and well-nourished. No distress.  HENT:  Head: Normocephalic and atraumatic.  Mouth/Throat: Oropharynx is clear and moist.  Eyes: Pupils are equal, round, and reactive to light. EOM are normal. Right eye exhibits no discharge. Left eye exhibits no discharge.  Neck: Normal range of motion. Neck supple.  Cardiovascular: Normal rate and regular rhythm.  Exam reveals no gallop and no friction rub.   Murmur (Grade 3/6 systolic murmur, best heard at right upper sternal border. Does not radiate to carotids. ) heard. Pulmonary/Chest: Effort normal and breath sounds normal. No respiratory distress. She has no wheezes. She has no rales.  Abdominal: Soft. Bowel sounds are normal. There is no tenderness. There is no  guarding.  Musculoskeletal: Normal range of motion.  Neurological: She is alert and oriented to person, place, and time. Coordination normal.  Mental Status:  Alert, oriented, thought content appropriate, able to give a coherent history. Speech fluent without evidence of aphasia. Able to follow 2 step commands without difficulty.  Cranial Nerves:  II:  Peripheral visual fields grossly normal, pupils equal, round, reactive to light III,IV, VI: ptosis not present, extra-ocular motions intact bilaterally  V,VII: smile symmetric, facial light touch sensation equal VIII: hearing grossly normal to voice  X: uvula elevates symmetrically  XI: bilateral shoulder shrug symmetric and strong XII: midline tongue extension without fassiculations Motor:  Normal tone. 5/5 in upper and lower extremities bilaterally including strong and equal grip strength and dorsiflexion/plantar flexion Sensory: Pinprick and light touch normal in all extremities.  Deep Tendon Reflexes: 2+ and symmetric in the biceps and patella Cerebellar: normal finger-to-nose with bilateral upper extremities Gait: normal gait and  balance CV: distal pulses palpable throughout    Skin: Skin is warm and dry. Capillary refill takes less than 2 seconds.  Psychiatric: She has a normal mood and affect.     ED Treatments / Results  Labs (all labs ordered are listed, but only abnormal results are displayed) Labs Reviewed  BASIC METABOLIC PANEL - Abnormal; Notable for the following:       Result Value   Glucose, Bld 166 (*)    All other components within normal limits  CBC  TROPONIN I  TROPONIN I  TROPONIN I  I-STAT TROPONIN, ED    EKG  EKG Interpretation  Date/Time:  Tuesday September 03 2017 20:10:01 EDT Ventricular Rate:  86 PR Interval:    QRS Duration: 97 QT Interval:  391 QTC Calculation: 468 R Axis:   -51 Text Interpretation:  Sinus rhythm Consider left atrial enlargement Inferior infarct, old Consider anterior infarct since last tracing no significant change Confirmed by Noemi Chapel [phone]) on 09/03/2017 8:13:46 PM       Radiology Dg Chest 2 View  Result Date: 09/03/2017 CLINICAL DATA:  Chest pain with shortness of breath EXAM: CHEST  2 VIEW COMPARISON:  03/07/2015, 07/30/2014 FINDINGS: Mild hyperinflation. No focal infiltrate or effusion. Cardiomediastinal silhouette is stable. Aortic atherosclerosis. Mitral annular calcification. No pneumothorax. IMPRESSION: No active cardiopulmonary disease.  Stable borderline cardiomegaly Electronically Signed   By: Donavan Foil M.D.   On: 09/03/2017 21:37    Procedures Procedures (including critical care time)  Medications Ordered in ED Medications  nitroGLYCERIN (NITROSTAT) SL tablet 0.4 mg (0.4 mg Sublingual Given 09/03/17 2316)  gi cocktail (Maalox,Lidocaine,Donnatal) (not administered)  clopidogrel (PLAVIX) tablet 75 mg (not administered)  cholecalciferol (VITAMIN D) tablet 1,000 Units (not administered)  metoprolol succinate (TOPROL-XL) 24 hr tablet 25 mg (not administered)  famotidine (PEPCID) tablet 20 mg (not administered)  polyethylene glycol  powder (GLYCOLAX/MIRALAX) container 17 g (not administered)  pravastatin (PRAVACHOL) tablet 20 mg (not administered)  LORazepam (ATIVAN) tablet 0.5 mg (not administered)  acetaminophen (TYLENOL) tablet 650 mg (650 mg Oral Given 09/03/17 2336)  ondansetron (ZOFRAN) injection 4 mg (not administered)  enoxaparin (LOVENOX) injection 40 mg (not administered)  morphine 4 MG/ML injection 2 mg (not administered)     Initial Impression / Assessment and Plan / ED Course  I have reviewed the triage vital signs and the nursing notes.  Pertinent labs & imaging results that were available during my care of the patient were reviewed by me and considered in my medical decision making (see chart for details).  Cardiac labs and CXR ordered. No focal neurological deficits on exam to suggest acute stroke. Will not proceed with head imaging at this time. Do not think that patient is having a PE, as she is not tachycardic or tachypnic, no leg swelling, no history of cancer or recent prolonged sitting. She does have a history of thoracic aortic aneurysm, although do not think that she is having a dissection at this time because she does not currently have neurological deficits on exam and has equal 2+ radial pulses.    CXR does not show pneumothorax, pneumonia. Troponin is negative, EKG nonischemic. HEART score 4. Discussed patient who was also seen by Dr. Sabra Heck who suggests consult for overnight observation of CP. Hospitalist service has agreed to admit.   Final Clinical Impressions(s) / ED Diagnoses   Final diagnoses:  Chest pain, unspecified type    New Prescriptions Current Discharge Medication List       Bernarda Caffey 09/04/17 1610    Noemi Chapel, MD 09/04/17 1535

## 2017-09-03 NOTE — ED Notes (Signed)
ED Provider at bedside. 

## 2017-09-03 NOTE — ED Notes (Signed)
Pt ambulatory to restroom

## 2017-09-03 NOTE — H&P (Signed)
History and Physical    [name] PXT:[id number] DOB: [birth year] DOA: 09/03/2017  PCP: Reynold Bowen, MD  Patient coming from: Home  I have personally briefly reviewed patient's old medical records in [place]  Chief Complaint: Chest pain  HPI: [name] is a 83 y.o. female with medical history significant of CAD, non-obstructive on cath in 2005 with 30% LAD occlusion.  On plavix.  Patient presents to the ED with c/o chest pain and L arm numbness.  45 min episode that occurred earlier this evening.  Resolved at time of eval in ED but reoccurred in ED and now ongoing for past 30 mins.  No h/o exertion, mild SOB, no N/V.   ED Course: Trop 0.  EKG unremarkable for acute findings as is the repeat during CP episode.   Review of Systems: As per HPI otherwise 10 point review of systems negative.   Past Medical History:  Diagnosis Date  . Aortic aneurysm, thoracic (HCC)    4.4 cm  . Aortic dilatation ([place])    Seen on cath 2005  . Arthritis   . CAD (coronary artery disease)    a. Nonobstructive by last cath in 05/2004 with 30% LAD. b. EF 65-70% 12/2010. c. Hx ASA intolerance.   . Colitis    12/2010 associated with transient lower GI bleeding/anemia tx with abx  . GERD (gastroesophageal reflux disease)    a. Minimal hiatal hernia on EGD 2006, no obvious esophageal source for CP at that time.  . Hypercholesteremia    Hx of myalgias felt to be statin related but taking Crestor as OP  . Hypertension   . Osteoporosis     Past Surgical History:  Procedure Laterality Date  . ABDOMINAL HYSTERECTOMY    . BACK SURGERY     Disc disease  . hysterectomy -- unknown type    . SHOULDER SURGERY    . Shunt for syringomyelia 1987    . TEMPORAL ARTERY BIOPSY / LIGATION     Negative 2005     reports that she quit smoking about 58 years ago. She has never used smokeless tobacco. She reports that she does not drink alcohol or use drugs.  Allergies  Allergen Reactions    . Aspirin Other (See Comments)    Feels like choking  . Sulfa Antibiotics     Unknown  . Bactrim Other (See Comments)    Unknown  . Crestor [Rosuvastatin] Other (See Comments)    Lower ext myalgia  . Niacin And Related Other (See Comments)    Turns red in the face  . Penicillins Other (See Comments)    Unknown  . Relafen [Nabumetone] Other (See Comments)    Unknown  . Tetracyclines & Related Rash    Family History  Problem Relation Age of Onset  . Heart disease Mother   . Heart failure Mother   . Arthritis Mother   . Heart disease Brother   . Heart disease Unknown   . Heart failure Unknown      Prior to Admission medications   Medication Sig Start Date End Date Taking? Authorizing Provider  Calcium Carbonate-Vitamin D (CALCIUM + D PO) Take 1 tablet by mouth every morning.    Yes [provider]  Carboxymethylcellul-Glycerin (LUBRICATING EYE DROPS OP) Place 1 drop into both eyes 2 (two) times daily.   Yes [provider]  cholecalciferol (VITAMIN D) 1000 UNITS tablet Take 1,000 Units by mouth daily.   Yes [provider]  clopidogrel (PLAVIX) 75 MG tablet Take 75 mg by mouth daily.   Yes [provider]  LORazepam (ATIVAN) 0.5 MG tablet Take 1 tablet (0.5 mg total) by mouth at bedtime as needed. For sleep. 09/03/15  Yes Darlyne Russian, MD  metoprolol succinate (TOPROL-XL) 25 MG 24 hr tablet Take 25 mg by mouth daily.   Yes [provider]  Multiple Vitamins-Minerals (MULTIVITAMIN PO) Take 1 tablet by mouth daily.    Yes [provider]  Pitavastatin Calcium (LIVALO) 1 MG TABS Take 1 tablet by mouth daily.   Yes [provider]  polyethylene glycol powder (GLYCOLAX/MIRALAX) powder Take 17 g by mouth 2 (two) times daily as needed. Patient taking differently: Take 17 g by mouth 2 (two) times daily as needed for mild constipation.  03/07/15  Yes Roselee Culver, MD  ranitidine (ZANTAC) 150 MG tablet Take 150 mg by  mouth at bedtime.    Yes [provider]    Physical Exam: Vitals:   09/03/17 2215 09/03/17 2230 09/03/17 2245 09/03/17 2300  BP: (!) 149/71 134/67 (!) 141/67 133/69  Pulse: 79 75 72 70  Resp: (!) 22 (!) 24 (!) 24 15  Temp:      TempSrc:      SpO2: 96% 93% 90% 94%  Weight:      Height:        Constitutional: NAD, calm, comfortable Eyes: PERRL, lids and conjunctivae normal ENMT: Mucous membranes are moist. Posterior pharynx clear of any exudate or lesions.Normal dentition.  Neck: normal, supple, no masses, no thyromegaly Respiratory: clear to auscultation bilaterally, no wheezing, no crackles. Normal respiratory effort. No accessory muscle use.  Cardiovascular: Regular rate and rhythm, no murmurs / rubs / gallops. No extremity edema. 2+ pedal pulses. No carotid bruits.  Abdomen: no tenderness, no masses palpated. No hepatosplenomegaly. Bowel sounds positive.  Musculoskeletal: no clubbing / cyanosis. No joint deformity upper and lower extremities. Good ROM, no contractures. Normal muscle tone.  Skin: no rashes, lesions, ulcers. No induration Neurologic: CN 2-12 grossly intact. Sensation intact, DTR normal. Strength 5/5 in all 4.  Psychiatric: Normal judgment and insight. Alert and oriented x 3. Normal mood.    Labs on Admission: I have personally reviewed following labs and imaging studies  CBC:  Recent Labs Lab 09/03/17 2046  WBC 6.3  HGB 13.1  HCT 39.8  MCV 94.5  PLT 062   Basic Metabolic Panel:  Recent Labs Lab 09/03/17 2046  NA 141  K 3.5  CL 104  CO2 28  GLUCOSE 166*  BUN 13  CREATININE 0.72  CALCIUM 9.2   GFR: Estimated Creatinine Clearance: 37.1 mL/min (by C-G formula based on SCr of 0.72 mg/dL). Liver Function Tests: No results for input(s): AST, ALT, ALKPHOS, BILITOT, PROT, ALBUMIN in the last 168 hours. No results for input(s): LIPASE, AMYLASE in the last 168 hours. No results for input(s): AMMONIA in the last 168 hours. Coagulation  Profile: No results for input(s): INR, PROTIME in the last 168 hours. Cardiac Enzymes: No results for input(s): CKTOTAL, CKMB, CKMBINDEX, TROPONINI in the last 168 hours. BNP (last 3 results) No results for input(s): PROBNP in the last 8760 hours. HbA1C: No results for input(s): HGBA1C in the last 72 hours. CBG: No results for input(s): GLUCAP in the last 168 hours. Lipid Profile: No results for input(s): CHOL, HDL, LDLCALC, TRIG, CHOLHDL, LDLDIRECT in the last 72 hours. Thyroid Function Tests: No results for input(s): TSH, T4TOTAL, FREET4, T3FREE, THYROIDAB in the last 72  hours. Anemia Panel: No results for input(s): VITAMINB12, FOLATE, FERRITIN, TIBC, IRON, RETICCTPCT in the last 72 hours. Urine analysis:    Component Value Date/Time   COLORURINE YELLOW 07/30/2014 1236   APPEARANCEUR CLEAR 07/30/2014 1236   LABSPEC 1.012 07/30/2014 1236   PHURINE 7.5 07/30/2014 1236   GLUCOSEU NEGATIVE 07/30/2014 1236   HGBUR NEGATIVE 07/30/2014 1236   BILIRUBINUR neg 03/07/2015 1506   KETONESUR NEGATIVE 07/30/2014 1236   PROTEINUR neg 03/07/2015 1506   PROTEINUR NEGATIVE 07/30/2014 1236   UROBILINOGEN 0.2 03/07/2015 1506   UROBILINOGEN 0.2 07/30/2014 1236   NITRITE neg 03/07/2015 1506   NITRITE NEGATIVE 07/30/2014 1236   LEUKOCYTESUR Negative 03/07/2015 1506    Radiological Exams on Admission: Dg Chest 2 View  Result Date: 09/03/2017 CLINICAL DATA:  Chest pain with shortness of breath EXAM: CHEST  2 VIEW COMPARISON:  03/07/2015, 07/30/2014 FINDINGS: Mild hyperinflation. No focal infiltrate or effusion. Cardiomediastinal silhouette is stable. Aortic atherosclerosis. Mitral annular calcification. No pneumothorax. IMPRESSION: No active cardiopulmonary disease.  Stable borderline cardiomegaly Electronically Signed   By: Donavan Foil M.D.   On: 09/03/2017 21:37    EKG: Independently reviewed.  Assessment/Plan Active Problems:   Chest pain, rule out acute myocardial infarction    1. CP  r/o - 1. Cont plavix and statin 2. CP obs pathway 3. Serial trops 4. Tele monitor 5. Call cards in AM 6. Trying NTG SL for CP 7. If that fails then try GI cocktail 8. If that fails then try morphine  DVT prophylaxis: Lovenox Code Status: Full Family Communication: No family in room Disposition Plan: Home after admit Consults called: None Admission status: Place in obs   Jayshaun Phillips, [facility] Pager [phone]  If 7AM-7PM, please contact day team taking care of patient [email or web address] Password [email or web address]  09/03/2017, 11:16 PM

## 2017-09-03 NOTE — ED Notes (Signed)
Patient transported to X-ray 

## 2017-09-03 NOTE — ED Triage Notes (Addendum)
Per EMS, pt from home. Pt reports around 1830 tonight pt was walking out to her mailbox when she started having central chest pressure with sob and her left arm became numb. Pt reports hx of TIA. Pt reports sx started resolving once EMS arrived and sx lasted about an hour. Pt denies pain, respirations e/u and lungs clear. Pt reports taking plavix and metoprolol for preventative measures.

## 2017-09-03 NOTE — ED Notes (Signed)
Pt called out and stated she was having a little bit of central chest pressure. EKG shot. MD aware.

## 2017-09-04 ENCOUNTER — Encounter (HOSPITAL_COMMUNITY): Payer: Self-pay | Admitting: *Deleted

## 2017-09-04 ENCOUNTER — Other Ambulatory Visit: Payer: Self-pay | Admitting: Emergency Medicine

## 2017-09-04 ENCOUNTER — Observation Stay (HOSPITAL_BASED_OUTPATIENT_CLINIC_OR_DEPARTMENT_OTHER): Payer: PPO

## 2017-09-04 DIAGNOSIS — R079 Chest pain, unspecified: Secondary | ICD-10-CM | POA: Diagnosis not present

## 2017-09-04 DIAGNOSIS — I10 Essential (primary) hypertension: Secondary | ICD-10-CM

## 2017-09-04 DIAGNOSIS — I340 Nonrheumatic mitral (valve) insufficiency: Secondary | ICD-10-CM

## 2017-09-04 DIAGNOSIS — I77810 Thoracic aortic ectasia: Secondary | ICD-10-CM | POA: Diagnosis not present

## 2017-09-04 LAB — TROPONIN I: Troponin I: <0.03 ng/mL (ref <0.03)

## 2017-09-04 LAB — ECHOCARDIOGRAM COMPLETE
Height: 60 in
Weight: 1977.6 oz

## 2017-09-04 MED ORDER — POLYETHYLENE GLYCOL 3350 17 G PO PACK: 17.0000 g | PACK | Freq: Two times a day (BID) | ORAL | Status: DC | PRN

## 2017-09-04 NOTE — Progress Notes (Signed)
PROGRESS NOTE    [name]  ELF:[id number] DOB: Aug 01, 1926 DOA: 09/03/2017 PCP: Reynold Bowen, MD   Outpatient Specialists:    Brief Narrative:  [name] is a 83 y.o. female with medical history significant of CAD, non-obstructive on cath in 2005 with 30% LAD occlusion.  On plavix.  Patient presents to the ED with c/o chest pain and L arm numbness.  45 min episode that occurred earlier this evening.  Resolved at time of eval in ED but reoccurred in ED and now ongoing for past 30 mins.  No h/o exertion, mild SOB, no N/V.   Assessment & Plan:   Active Problems:   Chest pain, rule out acute myocardial infarction   Chest pain with pain down left arm -with exertion and at rest -relieved with nitro -CE negative -EKG with no new changes -cards consult -echo for possible mumur     DVT prophylaxis:  Lovenox   Code Status: Full Code   Family Communication:   Disposition Plan:     Consultants:   cards    Subjective: No current chest pain or numbness  Objective: Vitals:   09/03/17 2300 09/03/17 2330 09/04/17 0019 09/04/17 0510  BP: 133/69 133/68 (!) 134/59 (!) 117/54  Pulse: 70 80 65 (!) 59  Resp: 15 (!) 25 (!) 22 18  Temp:   97.6 F (36.4 C) 98.2 F (36.8 C)  TempSrc:   Oral Oral  SpO2: 94% 90% 97% 96%  Weight:   56.1 kg (123 lb 9.6 oz)   Height:   5' (1.524 m)     Intake/Output Summary (Last 24 hours) at 09/04/17 1047 Last data filed at 09/04/17 0516  Gross per 24 hour  Intake                0 ml  Output              300 ml  Net             -300 ml   Filed Weights   09/03/17 2016 09/04/17 0019  Weight: 54.9 kg (121 lb) 56.1 kg (123 lb 9.6 oz)    Examination:  General exam: Appears calm and comfortable  Respiratory system: Clear to auscultation. Respiratory effort normal. Cardiovascular system: S1 & S2 heard, RRR. No JVD, + murmurs- ? systolic, rubs, gallops or clicks. No pedal edema. Gastrointestinal system: Abdomen is  nondistended, soft and nontender. No organomegaly or masses felt. Normal bowel sounds heard. Central nervous system: Alert and oriented. No focal neurological deficits. Extremities: Symmetric 5 x 5 power. Skin: No rashes, lesions or ulcers Psychiatry: Judgement and insight appear normal. Mood & affect appropriate.     Data Reviewed: I have personally reviewed following labs and imaging studies  CBC:  Recent Labs Lab 09/03/17 2046  WBC 6.3  HGB 13.1  HCT 39.8  MCV 94.5  PLT 585   Basic Metabolic Panel:  Recent Labs Lab 09/03/17 2046  NA 141  K 3.5  CL 104  CO2 28  GLUCOSE 166*  BUN 13  CREATININE 0.72  CALCIUM 9.2   GFR: Estimated Creatinine Clearance: 37.4 mL/min (by C-G formula based on SCr of 0.72 mg/dL). Liver Function Tests: No results for input(s): AST, ALT, ALKPHOS, BILITOT, PROT, ALBUMIN in the last 168 hours. No results for input(s): LIPASE, AMYLASE in the last 168 hours. No results for input(s): AMMONIA in the last 168 hours. Coagulation Profile: No results for input(s): INR, PROTIME in the last 168 hours. Cardiac  Enzymes:  Recent Labs Lab 09/04/17 0033 09/04/17 0223 09/04/17 0525  TROPONINI <0.03 <0.03 <0.03   BNP (last 3 results) No results for input(s): PROBNP in the last 8760 hours. HbA1C: No results for input(s): HGBA1C in the last 72 hours. CBG: No results for input(s): GLUCAP in the last 168 hours. Lipid Profile: No results for input(s): CHOL, HDL, LDLCALC, TRIG, CHOLHDL, LDLDIRECT in the last 72 hours. Thyroid Function Tests: No results for input(s): TSH, T4TOTAL, FREET4, T3FREE, THYROIDAB in the last 72 hours. Anemia Panel: No results for input(s): VITAMINB12, FOLATE, FERRITIN, TIBC, IRON, RETICCTPCT in the last 72 hours. Urine analysis:    Component Value Date/Time   COLORURINE YELLOW 07/30/2014 1236   APPEARANCEUR CLEAR 07/30/2014 1236   LABSPEC 1.012 07/30/2014 1236   PHURINE 7.5 07/30/2014 1236   GLUCOSEU NEGATIVE 07/30/2014  1236   HGBUR NEGATIVE 07/30/2014 1236   BILIRUBINUR neg 03/07/2015 1506   KETONESUR NEGATIVE 07/30/2014 1236   PROTEINUR neg 03/07/2015 1506   PROTEINUR NEGATIVE 07/30/2014 1236   UROBILINOGEN 0.2 03/07/2015 1506   UROBILINOGEN 0.2 07/30/2014 1236   NITRITE neg 03/07/2015 1506   NITRITE NEGATIVE 07/30/2014 1236   LEUKOCYTESUR Negative 03/07/2015 1506     )No results found for this or any previous visit (from the past 240 hour(s)).    Anti-infectives    None       Radiology Studies: Dg Chest 2 View  Result Date: 09/03/2017 CLINICAL DATA:  Chest pain with shortness of breath EXAM: CHEST  2 VIEW COMPARISON:  03/07/2015, 07/30/2014 FINDINGS: Mild hyperinflation. No focal infiltrate or effusion. Cardiomediastinal silhouette is stable. Aortic atherosclerosis. Mitral annular calcification. No pneumothorax. IMPRESSION: No active cardiopulmonary disease.  Stable borderline cardiomegaly Electronically Signed   By: Donavan Foil M.D.   On: 09/03/2017 21:37        Scheduled Meds: . cholecalciferol  1,000 Units Oral Daily  . clopidogrel  75 mg Oral Daily  . enoxaparin (LOVENOX) injection  40 mg Subcutaneous Q24H  . famotidine  20 mg Oral QHS  . metoprolol succinate  25 mg Oral Daily  . pravastatin  20 mg Oral q1800   Continuous Infusions:   LOS: 0 days    Time spent: 25 min    [place], DO [facility] Pager [phone]  If 7PM-7AM, please contact night-coverage [email or web address] Password [email or web address] 09/04/2017, 10:47 AM

## 2017-09-04 NOTE — Progress Notes (Signed)
Pt has orders to be discharged. Discharge instructions and stress test information given and pt has no additional questions at this time. Medication regimen reviewed and pt educated. Pt verbalized understanding and has no additional questions. Telemetry box removed. IV removed and site in good condition. Pt stable and waiting for transportation.

## 2017-09-04 NOTE — Discharge Summary (Signed)
Physician Discharge Summary  [name] OZD:[id number] DOB: 26-Nov-1926 DOA: 09/03/2017  PCP: Reynold Bowen, MD  Admit date: 09/03/2017 Discharge date: 09/04/2017   Recommendations for Outpatient Follow-Up:   1. outpatient stress test   Discharge Diagnosis:   Active Problems:   Chest pain, rule out acute myocardial infarction   Discharge disposition:  Home.  Discharge Condition: Improved.  Diet recommendation: Low sodium, heart healthy.  Wound care: None.   History of Present Illness:   [name] is a 83 y.o. female with medical history significant of CAD, non-obstructive on cath in 2005 with 30% LAD occlusion.  On plavix.  Patient presents to the ED with c/o chest pain and L arm numbness.  45 min episode that occurred earlier this evening.  Resolved at time of eval in ED but reoccurred in ED and now ongoing for past 30 mins.  No h/o exertion, mild SOB, no N/V.   Hospital Course by Problem:   Chest pain with pain down left arm -with exertion and at rest -relieved with nitro -CE negative -EKG with no new changes -cards consult- OUTPATIENT STRESS TEST -echo: - Vigorous LV systolic function; mild diastolic dysfunction;   moderate LVH; elevated LV filling pressure; sclerotic aortic   valve with trace AI; mildly dilated ascending aorta (4.5 cm);   suggest CTA or MRA to further assess; mild MR; mild LAE; trace TR   with mildly elevated pulmonary pressure.    Medical Consultants:   cards  Discharge Exam:   Vitals:   09/04/17 0510 09/04/17 1200  BP: (!) 117/54 130/88  Pulse: (!) 59 63  Resp: 18 18  Temp: 98.2 F (36.8 C) 98 F (36.7 C)  SpO2: 96% 98%   Vitals:   09/03/17 2330 09/04/17 0019 09/04/17 0510 09/04/17 1200  BP: 133/68 (!) 134/59 (!) 117/54 130/88  Pulse: 80 65 (!) 59 63  Resp: (!) 25 (!) 22 18 18   Temp:  97.6 F (36.4 C) 98.2 F (36.8 C) 98 F (36.7 C)  TempSrc:  Oral Oral Oral  SpO2: 90% 97% 96% 98%  Weight:  56.1 kg (123  lb 9.6 oz)    Height:  5' (1.524 m)         The results of significant diagnostics from this hospitalization (including imaging, microbiology, ancillary and laboratory) are listed below for reference.     Procedures and Diagnostic Studies:   Dg Chest 2 View  Result Date: 09/03/2017 CLINICAL DATA:  Chest pain with shortness of breath EXAM: CHEST  2 VIEW COMPARISON:  03/07/2015, 07/30/2014 FINDINGS: Mild hyperinflation. No focal infiltrate or effusion. Cardiomediastinal silhouette is stable. Aortic atherosclerosis. Mitral annular calcification. No pneumothorax. IMPRESSION: No active cardiopulmonary disease.  Stable borderline cardiomegaly Electronically Signed   By: Donavan Foil M.D.   On: 09/03/2017 21:37     Labs:   Basic Metabolic Panel:  Recent Labs Lab 09/03/17 2046  NA 141  K 3.5  CL 104  CO2 28  GLUCOSE 166*  BUN 13  CREATININE 0.72  CALCIUM 9.2   GFR Estimated Creatinine Clearance: 37.4 mL/min (by C-G formula based on SCr of 0.72 mg/dL). Liver Function Tests: No results for input(s): AST, ALT, ALKPHOS, BILITOT, PROT, ALBUMIN in the last 168 hours. No results for input(s): LIPASE, AMYLASE in the last 168 hours. No results for input(s): AMMONIA in the last 168 hours. Coagulation profile No results for input(s): INR, PROTIME in the last 168 hours.  CBC:  Recent Labs Lab 09/03/17 2046  WBC  6.3  HGB 13.1  HCT 39.8  MCV 94.5  PLT 201   Cardiac Enzymes:  Recent Labs Lab 09/04/17 0033 09/04/17 0223 09/04/17 0525  TROPONINI <0.03 <0.03 <0.03   BNP: Invalid input(s): POCBNP CBG: No results for input(s): GLUCAP in the last 168 hours. D-Dimer No results for input(s): DDIMER in the last 72 hours. Hgb A1c No results for input(s): HGBA1C in the last 72 hours. Lipid Profile No results for input(s): CHOL, HDL, LDLCALC, TRIG, CHOLHDL, LDLDIRECT in the last 72 hours. Thyroid function studies No results for input(s): TSH, T4TOTAL, T3FREE, THYROIDAB in the  last 72 hours.  Invalid input(s): FREET3 Anemia work up No results for input(s): VITAMINB12, FOLATE, FERRITIN, TIBC, IRON, RETICCTPCT in the last 72 hours. Microbiology No results found for this or any previous visit (from the past 240 hour(s)).   Discharge Instructions:   Discharge Instructions    Diet - low sodium heart healthy    Complete by:  As directed    Discharge instructions    Complete by:  As directed    Outpatient stress test   Increase activity slowly    Complete by:  As directed      Allergies as of 09/04/2017      Reactions   Aspirin Other (See Comments)   Feels like choking   Sulfa Antibiotics    Unknown   Bactrim Other (See Comments)   Unknown   Crestor [rosuvastatin] Other (See Comments)   Lower ext myalgia   Niacin And Related Other (See Comments)   Turns red in the face   Penicillins Other (See Comments)   Unknown   Relafen [nabumetone] Other (See Comments)   Unknown   Tetracyclines & Related Rash      Medication List    TAKE these medications   CALCIUM + D PO Take 1 tablet by mouth every morning.   cholecalciferol 1000 units tablet Commonly known as:  VITAMIN D Take 1,000 Units by mouth daily.   clopidogrel 75 MG tablet Commonly known as:  PLAVIX Take 75 mg by mouth daily.   LIVALO 1 MG Tabs Generic drug:  Pitavastatin Calcium Take 1 tablet by mouth daily.   LORazepam 0.5 MG tablet Commonly known as:  ATIVAN Take 1 tablet (0.5 mg total) by mouth at bedtime as needed. For sleep.   LUBRICATING EYE DROPS OP Place 1 drop into both eyes 2 (two) times daily.   metoprolol succinate 25 MG 24 hr tablet Commonly known as:  TOPROL-XL Take 25 mg by mouth daily.   MULTIVITAMIN PO Take 1 tablet by mouth daily.   polyethylene glycol powder powder Commonly known as:  GLYCOLAX/MIRALAX Take 17 g by mouth 2 (two) times daily as needed. What changed:  reasons to take this   ranitidine 150 MG tablet Commonly known as:  ZANTAC Take 150 mg by  mouth at bedtime.            Discharge Care Instructions        Start     Ordered   09/04/17 0000  Increase activity slowly     09/04/17 1240   09/04/17 0000  Diet - low sodium heart healthy     09/04/17 1240   09/04/17 0000  Discharge instructions    Comments:  Outpatient stress test   09/04/17 1240     Follow-up Information    Reynold Bowen, MD Follow up in 1 week(s).   Specialty:  Endocrinology Contact information: [address] [place] [address] [phone]  Time coordinating discharge: 35 min  Signed:  Kasiya Burck U [place]   [facility] 09/04/2017, 12:40 PM

## 2017-09-04 NOTE — Consult Note (Signed)
Cardiology Consultation:   [name]; [id number]; 01-10-1926   Admit date: 09/03/2017 Date of Consult: 09/04/2017  Primary Care Provider: Reynold Bowen, MD Primary Cardiologist: Dr. Peter Martinique Primary Electrophysiologist:  None   Patient Profile:   [name] is a 83 y.o. female with a hx of ascending aortic ectasia and mild aneurysm (4.4 cm 2015), high cholesterol, HTN  who is being seen today for the evaluation of chest pain at the request of Dr. Eliseo Squires.  History of Present Illness:   [name]  Was last seen by Dr. Martinique on July 31, 2016 for palpitations. He mentions that the patient had a prior cardiac catheterization in 2005 showing nonobstructive disease. Negative Myoview study July 2013 was normal. Unremarkable echo in July 2015. She was having increased HR to 95 and was feeling pounding and racing. She had been under a lot of stress with her husband being wheelchair bound. On Plavix, Metoprolol and statin  She presented to the ER yesterday for chest pain and left arm numbness, the episode had lasted 45 minutes that occurred in the evening. By the time the patient arrived to the hospital the pain had resolved but then re-occured. She described the pain as an acute onset of heaviness and squeezing that was severe. She denies having had this in the past.  ER COURSE:   EKG showed no acute changes, Troponin negative x 3.Chets xray aortic atherosclerosis: Otherwise negative.  HOSPITAL COURSE:  Chest pain resolved after two nitroglycerin but the patient then  Developed headache and dizziness.      Past Medical History:  Diagnosis Date  . Aortic aneurysm, thoracic (HCC)    4.4 cm  . Aortic dilatation ([place])    Seen on cath 2005  . Arthritis   . CAD (coronary artery disease)    a. Nonobstructive by last cath in 05/2004 with 30% LAD. b. EF 65-70% 12/2010. c. Hx ASA intolerance.   . Colitis    12/2010 associated with transient lower GI  bleeding/anemia tx with abx  . GERD (gastroesophageal reflux disease)    a. Minimal hiatal hernia on EGD 2006, no obvious esophageal source for CP at that time.  . Hypercholesteremia    Hx of myalgias felt to be statin related but taking Crestor as OP  . Hypertension   . Osteoporosis     Past Surgical History:  Procedure Laterality Date  . ABDOMINAL HYSTERECTOMY    . BACK SURGERY     Disc disease  . hysterectomy -- unknown type    . SHOULDER SURGERY    . Shunt for syringomyelia 1987    . TEMPORAL ARTERY BIOPSY / LIGATION     Negative 2005     Inpatient Medications: Scheduled Meds: . cholecalciferol  1,000 Units Oral Daily  . clopidogrel  75 mg Oral Daily  . enoxaparin (LOVENOX) injection  40 mg Subcutaneous Q24H  . famotidine  20 mg Oral QHS  . metoprolol succinate  25 mg Oral Daily  . pravastatin  20 mg Oral q1800   Continuous Infusions:  PRN Meds: acetaminophen, gi cocktail, LORazepam, morphine injection, nitroGLYCERIN, ondansetron (ZOFRAN) IV, polyethylene glycol  Allergies:    Allergies  Allergen Reactions  . Aspirin Other (See Comments)    Feels like choking  . Sulfa Antibiotics     Unknown  . Bactrim Other (See Comments)    Unknown  . Crestor [Rosuvastatin] Other (See Comments)    Lower ext myalgia  . Niacin And Related Other (  See Comments)    Turns red in the face  . Penicillins Other (See Comments)    Unknown  . Relafen [Nabumetone] Other (See Comments)    Unknown  . Tetracyclines & Related Rash    Social History:   Social History   Social History  . Marital status: Married    Spouse name: N/A  . Number of children: 2  . Years of education: college    Occupational History  . retired     Social History Main Topics  . Smoking status: Former Smoker    Quit date: 04/26/1959  . Smokeless tobacco: Never Used  . Alcohol use No  . Drug use: No  . Sexual activity: No   Other Topics Concern  . Not on file   Social History Narrative   Patient  lives with her husband    Patient is right handed   Patient drinks coffee daily    Family History:   Family History  Problem Relation Age of Onset  . Heart disease Mother   . Heart failure Mother   . Arthritis Mother   . Heart disease Brother   . Heart disease Unknown   . Heart failure Unknown      ROS:  Please see the history of present illness.  ROS  All other ROS reviewed and negative.     Physical Exam/Data:   Vitals:   09/03/17 2300 09/03/17 2330 09/04/17 0019 09/04/17 0510  BP: 133/69 133/68 (!) 134/59 (!) 117/54  Pulse: 70 80 65 (!) 59  Resp: 15 (!) 25 (!) 22 18  Temp:   97.6 F (36.4 C) 98.2 F (36.8 C)  TempSrc:   Oral Oral  SpO2: 94% 90% 97% 96%  Weight:   123 lb 9.6 oz (56.1 kg)   Height:   5' (1.524 m)     Intake/Output Summary (Last 24 hours) at 09/04/17 1018 Last data filed at 09/04/17 0516  Gross per 24 hour  Intake                0 ml  Output              300 ml  Net             -300 ml   Filed Weights   09/03/17 2016 09/04/17 0019  Weight: 121 lb (54.9 kg) 123 lb 9.6 oz (56.1 kg)   Body mass index is 24.14 kg/m.  General:  Well nourished, well developed, in no acute distress, elderly caucasian female HEENT: normal Lymph: no adenopathy Neck: no JVD Endocrine:  No thryomegaly Vascular: No carotid bruits; FA pulses 2+ bilaterally without bruits  Cardiac:  normal S1, S2; RRR; no murmur  Lungs:  clear to auscultation bilaterally, no wheezing, rhonchi or rales  Abd: soft, nontender, no hepatomegaly  Ext: no edema Musculoskeletal:  No deformities, BUE and BLE strength normal and equal Skin: warm and dry  Neuro:  CNs 2-12 intact, no focal abnormalities noted Psych:  Normal affect   EKG:  The EKG was personally reviewed and demonstrates:  Sinus rhythm  Telemetry:  Telemetry was personally reviewed and demonstrates:  NSR  Relevant CV Studies:  US doppler carotid (06/2014) Summary:  - The vertebral arteries appear patent with antegrade  flow. - Findings consistent with 1- 39 percent stenosis involving the right internal carotid artery and the left internal carotid artery.  Myoview study July 2013 was normal Thoracic aortic aneurysm. 4.4 cm with mild growth over the past 13  years   Laboratory Data:  Chemistry Recent Labs Lab 09/03/17 2046  NA 141  K 3.5  CL 104  CO2 28  GLUCOSE 166*  BUN 13  CREATININE 0.72  CALCIUM 9.2  GFRNONAA >60  GFRAA >60  ANIONGAP 9    No results for input(s): PROT, ALBUMIN, AST, ALT, ALKPHOS, BILITOT in the last 168 hours. Hematology Recent Labs Lab 09/03/17 2046  WBC 6.3  RBC 4.21  HGB 13.1  HCT 39.8  MCV 94.5  MCH 31.1  MCHC 32.9  RDW 12.5  PLT 201   Cardiac Enzymes Recent Labs Lab 09/04/17 0033 09/04/17 0223 09/04/17 0525  TROPONINI <0.03 <0.03 <0.03    Recent Labs Lab 09/03/17 2051  TROPIPOC 0.00     Radiology/Studies:  Dg Chest 2 View  Result Date: 09/03/2017 CLINICAL DATA:  Chest pain with shortness of breath EXAM: CHEST  2 VIEW COMPARISON:  03/07/2015, 07/30/2014 FINDINGS: Mild hyperinflation. No focal infiltrate or effusion. Cardiomediastinal silhouette is stable. Aortic atherosclerosis. Mitral annular calcification. No pneumothorax. IMPRESSION: No active cardiopulmonary disease.  Stable borderline cardiomegaly Electronically Signed   By: Donavan Foil M.D.   On: 09/03/2017 21:37    Assessment and Plan:   1. Chest pain: hx of nonobstructive CAD with coronary calcifications, Normal Myoview study in 2013. CT scan 05/10/2015 noted atherosclerosis including left pain and 3 vessel coronary artery disease. Daughter-in-law transcribing the conversation  Patient has not had anymore pain. She has been having to be more of a caregiver to her husband lately. She admits to more shortness of breath over the past month but has not gained weight. Has not had cough. The "discomfort, with arm numbness and maybe a little bit of jaw pain" came out of the blue last  night. However her daughter had stents placed here at [facility] within the past few weeks. She says that if it is necessary to have a stent placed to prolong her life then that is what she would prefer. -- allergic to aspirin (makes her short of breath) -- continue Plavix, Metoprolol and Statin She has not had EKG changes and has had 3 neg trops.  Will plan for outpatient Nuclear Medicine stress test as an outpatient in the office.  2. Thoracic aortic aneurysm: 4.4 cm in 2015.  3. Hypertension:BP well controlled.   4. Hx of bradycardia: HR stable, 50-80s, no bradycardia  5. Hx of palpitations: Denies having recent palpitations  Signed, Linus Mako, PA-C  09/04/2017   Personally seen and examined. Agree with above.  83 year old female with dilated aortic arch, hypertension here with intermittent shortness of breath and transient left arm numbness. She ruled out for myocardial infarction with negative troponins. ECG with nonspecific ST-T wave changes.  On physical exam: Alert, younger than stated age, regular rate and rhythm, lungs are clear, abdomen soft, no edema  Atypical chest pain/shortness of breath  - Reassurance has been given that she has not had acute coronary syndrome. Daughter-in-law present in room for discussion as well.  - I gave her several options including invasive approach, noninvasive approach and conservative approach of no further testing. She would like to proceed with outpatient Lexiscan stress test. I think that this is a reasonable approach given her intermittent symptoms which could be anginal equivalent. Of course if her stress test is high risk further discussion of cardiac catheterization would need to take place. She was quite adamant about not proceeding in this fashion currently.  - Echocardiogram demonstrates normal ejection fraction and dilated aortic  root of 4.5 cm. Continue surveillance.  Continue with current medical therapy. I'm fine with her  being discharged. We will set up stress test evaluation. She has appointment with Dr. Martinique in October she states.  Candee Furbish, MD

## 2017-09-04 NOTE — Discharge Instructions (Signed)
° °  You have a Stress Test scheduled at [facility]. Your doctor has ordered this test to check the blood flow in your heart arteries. ° °Please arrive 15 minutes early for paperwork. The whole test will take several hours. You may want to bring reading material to remain occupied while undergoing different parts of the test. ° °Instructions: °· No food/drink after midnight the night before. °· It is OK to take your morning meds with a sip of water EXCEPT for those types of medicines listed below or otherwise instructed. °· No caffeine/decaf products 24 hours before, including medicines such as Excedrin or Goody Powders. Call if there are any questions.  °· Wear comfortable clothes and shoes.  ° °Special Medication Instructions: °· Beta blockers such as metoprolol (Lopressor/Toprol XL), atenolol (Tenormin), carvedilol (Coreg), nebivolol (Bystolic), bisoprolol (Zebeta), propranolol (Inderal) should not be taken for 24 hours before the test. °· Calcium channel blockers such as diltiazem (Cardizem) or verapmil (Calan) should not be taken for 24 hours before the test. °· Remove nitroglycerin patches and do not take nitrate preparations such as Imdur/isosorbide the day of your test. °· No Persantine/Theophylline or Aggrenox medicines should be used within 24 hours of the test.  °· If you are diabetic, please ask which medications to hold the day of the test ° °What To Expect: °When you arrive in the lab, the technician will inject a small amount of radioactive tracer into your arm through an IV while you are resting quietly. This helps us to form pictures of your heart. You will likely only feel a sting from the IV. After a waiting period, resting pictures will be obtained under a big camera. These are the "before" pictures. ° °Next, you will be prepped for the stress portion of the test. This may include either walking on a treadmill or receiving a medicine that helps to dilate blood vessels in  your heart to simulate the effect of exercise on your heart. If you are walking on a treadmill, you will walk at different paces to try to get your heart rate to a goal number that is based on your age. If your doctor has chosen the pharmacologic test, then you will receive a medicine through your IV that may cause temporary nausea, flushing, shortness of breath and sometimes chest discomfort or vomiting. This is typically short-lived and usually resolves quickly. If you experience symptoms, that does not automatically mean the test is abnormal. Some patients do not experience any symptoms at all. Your blood pressure and heart rate will be monitored, and we will be watching your EKG on a computer screen for any changes. During this portion of the test, the radiologist will inject another small amount of radioactive tracer into your IV. After a waiting period, you will undergo a second set of pictures. These are the "after" pictures. ° °The doctor reading the test will compare the before-and-after images to look for evidence of heart blockages or heart weakness. The test usually takes 1 day to complete, but in certain instances (for example, if a patient is over a certain weight limit), the test may be done over the span of 2 days. ° ° °

## 2017-09-04 NOTE — Progress Notes (Signed)
  Echocardiogram 2D Echocardiogram has been performed.  Darlina Sicilian M 09/04/2017, 11:47 AM

## 2017-09-05 ENCOUNTER — Telehealth: Payer: Self-pay | Admitting: *Deleted

## 2017-09-05 DIAGNOSIS — I2584 Coronary atherosclerosis due to calcified coronary lesion: Secondary | ICD-10-CM

## 2017-09-05 DIAGNOSIS — R079 Chest pain, unspecified: Secondary | ICD-10-CM

## 2017-09-05 DIAGNOSIS — I2510 Atherosclerotic heart disease of native coronary artery without angina pectoris: Secondary | ICD-10-CM

## 2017-09-05 NOTE — Telephone Encounter (Signed)
Per message from Delos Haring: Nuc Stress Test  Received: Yesterday   Schedule follow-up appointment  Message Contents  Carlota Raspberry, Tiffany, PA-C  P Cv Div [facility]        Good afternoon-  Can you please assist me to schedule Ms. Barkey for a Ambulance person at [address] location? She weighs 123 lbs. I will schedule follow-up appointment with Dr. Martinique or a team member to be seen in 3-4 weeks for test results.   Thank you,  Delos Haring    I have called the pt and she is agreeable to scheduling [place]. Pt aware our schedulers will call her to make appt. Pt aware she will need a 3-4 week f/u with Dr. Martinique for [place] f/u per Delos Haring. I went over Myoview instructions by phone with the pt and will mail her instructions today as well, I verified pt's address. Order for Carlton Adam has been placed. I will send a message to Cherie Dark to schedule Myoview. I will send a message to [facility] to schedule 3-4 week f/u with Dr. Martinique per Delos Haring. Pt thanked me for my call and my help today.

## 2017-09-05 NOTE — Telephone Encounter (Signed)
-----   Message from [name], [name] sent at 09/04/2017 12:36 PM EDT ----- Regarding: Nuc Stress Test Good afternoon- Can you please assist me to schedule [name] for a Ambulance person at [address] location? She weighs 123 lbs. I will schedule follow-up appointment with Dr. Martinique or a team member to be seen in 3-4 weeks for test results.  Thank you, [name]

## 2017-09-09 ENCOUNTER — Telehealth (HOSPITAL_COMMUNITY): Payer: Self-pay | Admitting: *Deleted

## 2017-09-09 NOTE — Telephone Encounter (Signed)
Patient given detailed instructions per Myocardial Perfusion Study Information Sheet for the test on 09/11/17 at 0945. Patient notified to arrive 15 minutes early and that it is imperative to arrive on time for appointment to keep from having the test rescheduled.  If you need to cancel or reschedule your appointment, please call the office within 24 hours of your appointment. . Patient verbalized understanding.Zadie Deemer, [place]

## 2017-09-11 ENCOUNTER — Ambulatory Visit (HOSPITAL_COMMUNITY): Payer: PPO | Attending: Cardiovascular Disease

## 2017-09-11 DIAGNOSIS — R9439 Abnormal result of other cardiovascular function study: Secondary | ICD-10-CM | POA: Diagnosis not present

## 2017-09-11 DIAGNOSIS — R0602 Shortness of breath: Secondary | ICD-10-CM | POA: Insufficient documentation

## 2017-09-11 DIAGNOSIS — R079 Chest pain, unspecified: Secondary | ICD-10-CM | POA: Insufficient documentation

## 2017-09-11 DIAGNOSIS — I2510 Atherosclerotic heart disease of native coronary artery without angina pectoris: Secondary | ICD-10-CM | POA: Diagnosis not present

## 2017-09-11 DIAGNOSIS — I10 Essential (primary) hypertension: Secondary | ICD-10-CM | POA: Diagnosis not present

## 2017-09-11 DIAGNOSIS — J449 Chronic obstructive pulmonary disease, unspecified: Secondary | ICD-10-CM | POA: Diagnosis not present

## 2017-09-11 DIAGNOSIS — I2584 Coronary atherosclerosis due to calcified coronary lesion: Secondary | ICD-10-CM | POA: Diagnosis not present

## 2017-09-11 DIAGNOSIS — Z8249 Family history of ischemic heart disease and other diseases of the circulatory system: Secondary | ICD-10-CM | POA: Insufficient documentation

## 2017-09-11 DIAGNOSIS — R0609 Other forms of dyspnea: Secondary | ICD-10-CM | POA: Insufficient documentation

## 2017-09-11 LAB — MYOCARDIAL PERFUSION IMAGING
CHL CUP NUCLEAR SDS: 0
CHL CUP NUCLEAR SRS: 7
CHL CUP NUCLEAR SSS: 3
LHR: 0.39
LV dias vol: 55 mL (ref 46–106)
LVSYSVOL: 14 mL
NUC STRESS TID: 0.9
Peak HR: 93 {beats}/min
Rest HR: 63 {beats}/min

## 2017-09-11 MED ORDER — TECHNETIUM TC 99M TETROFOSMIN IV KIT
32.1000 | PACK | Freq: Once | INTRAVENOUS | Status: AC | PRN
  Administered 2017-09-11: 32.1 via INTRAVENOUS
  Filled 2017-09-11: qty 33

## 2017-09-11 MED ORDER — TECHNETIUM TC 99M TETROFOSMIN IV KIT
10.3000 | PACK | Freq: Once | INTRAVENOUS | Status: AC | PRN
  Administered 2017-09-11: 10.3 via INTRAVENOUS
  Filled 2017-09-11: qty 11

## 2017-09-11 MED ORDER — REGADENOSON 0.4 MG/5ML IV SOLN
0.4000 mg | Freq: Once | INTRAVENOUS | Status: AC
  Administered 2017-09-11: 0.4 mg via INTRAVENOUS

## 2017-09-24 ENCOUNTER — Encounter: Payer: Self-pay | Admitting: Physician Assistant

## 2017-09-24 ENCOUNTER — Ambulatory Visit (INDEPENDENT_AMBULATORY_CARE_PROVIDER_SITE_OTHER): Payer: PPO | Admitting: Physician Assistant

## 2017-09-24 VITALS — BP 118/62 | HR 68 | Ht 60.0 in | Wt 123.0 lb

## 2017-09-24 DIAGNOSIS — E785 Hyperlipidemia, unspecified: Secondary | ICD-10-CM

## 2017-09-24 DIAGNOSIS — I712 Thoracic aortic aneurysm, without rupture, unspecified: Secondary | ICD-10-CM

## 2017-09-24 DIAGNOSIS — Z23 Encounter for immunization: Secondary | ICD-10-CM

## 2017-09-24 DIAGNOSIS — I10 Essential (primary) hypertension: Secondary | ICD-10-CM

## 2017-09-24 DIAGNOSIS — R079 Chest pain, unspecified: Secondary | ICD-10-CM | POA: Diagnosis not present

## 2017-09-24 DIAGNOSIS — I2510 Atherosclerotic heart disease of native coronary artery without angina pectoris: Secondary | ICD-10-CM | POA: Diagnosis not present

## 2017-09-24 NOTE — Patient Instructions (Addendum)
[name], [place] recommends that you continue on your current medications as directed. Please refer to the Current Medication list given to you today.  [name], [place] has recommended that you have FASTING blood work to check cholesterol within the next 3 months.  Your physician wants you to follow-up in: 6 months with Dr. Martinique. You will receive a reminder letter in the mail two months in advance. If you don't receive a letter, please call our office to schedule the follow-up appointment.  ** your previous appointment on October 11, 2017 with Dr. Martinique has been cancelled

## 2017-09-24 NOTE — Progress Notes (Signed)
Cardiology Office Note    Date:  09/25/2017   ID:  [name], DOB 1926-04-27, MRN [id number]  PCP:  Reynold Bowen, MD  Cardiologist:  Dr. Martinique   Chief Complaint  Patient presents with  . Follow-up    seen for Dr. Martinique    History of Present Illness:  [name] is a 83 y.o. female with PMH of ascending aortic ectasia and mild aneurysm, HLD, HTN and CAD. She is also the mother of another one of our patient, [name] who is a Family Medicine PA. Patient had a cardiac catheterization in 2005 showing nonobstructive CAD. She had negative Myoview in July 2013. Echocardiogram in July 2015 showed normal EF 65-70%, mild LVH, grade 1 DD. She recently presented to the hospital with chest pain on 09/03/2014. She has been under a lot of stress as her husband is wheelchair bound. Serial troponin was negative. Echocardiogram obtained on 09/04/2017 showed EF 65-70%, grade 1 DD, mild MR, PA peak pressure 35 mmHg, mildly dilated ascending aorta at 4.5 cm. After discussing with the patient various options including invasive versus noninvasive approach, patient agreed to outpatient stress test. This was completed on 09/11/2017 which showed EF 74%, small defect of mild severity present in the apical lateral location, no ischemia identified, overall low risk study.  She presents today for cardiology office visit, she has not had any further chest discomfort since she left the hospital. She denies any shortness of breath. She has been having mild productive cough for the past few weeks. Interestingly, her lungs actually sounds very clear without any wheezing, rhonchi or crackle. She does not have any sign of volume overload such as lower extremity edema, orthopnea or PND. She does have a chronic dull aching in her chest after eating dinner every night, she says this is consistent with her acid reflux symptom and it would go away with Tums. This discomfort does not occur when she ambulates or  clean the house. Given the chronic nature and the non-exertional component, I would not recommend any further workup at this time. She also requested flu shot today as well.    Past Medical History:  Diagnosis Date  . Aortic aneurysm, thoracic (HCC)    4.4 cm  . Aortic dilatation ([place])    Seen on cath 2005  . Arthritis   . CAD (coronary artery disease)    a. Nonobstructive by last cath in 05/2004 with 30% LAD. b. EF 65-70% 12/2010. c. Hx ASA intolerance.   . Colitis    12/2010 associated with transient lower GI bleeding/anemia tx with abx  . GERD (gastroesophageal reflux disease)    a. Minimal hiatal hernia on EGD 2006, no obvious esophageal source for CP at that time.  . Hypercholesteremia    Hx of myalgias felt to be statin related but taking Crestor as OP  . Hypertension   . Osteoporosis     Past Surgical History:  Procedure Laterality Date  . ABDOMINAL HYSTERECTOMY    . BACK SURGERY     Disc disease  . hysterectomy -- unknown type    . SHOULDER SURGERY    . Shunt for syringomyelia 1987    . TEMPORAL ARTERY BIOPSY / LIGATION     Negative 2005    Current Medications: Outpatient Medications Prior to Visit  Medication Sig Dispense Refill  . Calcium Carbonate-Vitamin D (CALCIUM + D PO) Take 1 tablet by mouth every morning.     . Carboxymethylcellul-Glycerin (LUBRICATING EYE DROPS OP)  Place 1 drop into both eyes 2 (two) times daily.    . cholecalciferol (VITAMIN D) 1000 UNITS tablet Take 1,000 Units by mouth daily.    . clopidogrel (PLAVIX) 75 MG tablet Take 75 mg by mouth daily.    Marland Kitchen LORazepam (ATIVAN) 0.5 MG tablet Take 1 tablet (0.5 mg total) by mouth at bedtime as needed. For sleep. 30 tablet 5  . metoprolol succinate (TOPROL-XL) 25 MG 24 hr tablet Take 12.5 mg by mouth daily.     . Multiple Vitamins-Minerals (MULTIVITAMIN PO) Take 1 tablet by mouth daily.     . Pitavastatin Calcium (LIVALO) 1 MG TABS Take 1 tablet by mouth daily.    . polyethylene glycol powder  (GLYCOLAX/MIRALAX) powder Take 17 g by mouth 2 (two) times daily as needed. (Patient taking differently: Take 17 g by mouth 2 (two) times daily as needed for mild constipation. ) 3350 g 1  . ranitidine (ZANTAC) 150 MG tablet Take 150 mg by mouth at bedtime.      No facility-administered medications prior to visit.      Allergies:   Aspirin; Crestor [rosuvastatin]; Bactrim; Demeclocycline; Niacin; Niacin and related; Penicillins; Relafen [nabumetone]; Sulfa antibiotics; and Tetracyclines & related   Social History   Social History  . Marital status: Married    Spouse name: N/A  . Number of children: 2  . Years of education: college    Occupational History  . retired     Social History Main Topics  . Smoking status: Former Smoker    Quit date: 04/26/1959  . Smokeless tobacco: Never Used  . Alcohol use No  . Drug use: No  . Sexual activity: No   Other Topics Concern  . None   Social History Narrative   Patient lives with her husband    Patient is right handed   Patient drinks coffee daily     Family History:  The patient's family history includes Arthritis in her mother; Heart disease in her brother, mother, and unknown relative; Heart failure in her mother and unknown relative.   ROS:   Please see the history of present illness.    ROS All other systems reviewed and are negative.   PHYSICAL EXAM:   VS:  BP 118/62   Pulse 68   Ht 5' (1.524 m)   Wt 123 lb (55.8 kg)   BMI 24.02 kg/m    GEN: Well nourished, well developed, in no acute distress  HEENT: normal  Neck: no JVD, carotid bruits, or masses Cardiac: RRR; no murmurs, rubs, or gallops,no edema  Respiratory:  clear to auscultation bilaterally, normal work of breathing GI: soft, nontender, nondistended, + BS MS: no deformity or atrophy  Skin: warm and dry, no rash Neuro:  Alert and Oriented x 3, Strength and sensation are intact Psych: euthymic mood, full affect  Wt Readings from Last 3 Encounters:  09/24/17  123 lb (55.8 kg)  09/04/17 123 lb 9.6 oz (56.1 kg)  07/31/16 130 lb 6.4 oz (59.1 kg)      Studies/Labs Reviewed:   EKG:  EKG is not ordered today.    Recent Labs: 09/03/2017: BUN 13; Creatinine, Ser 0.72; Hemoglobin 13.1; Platelets 201; Potassium 3.5; Sodium 141   Lipid Panel    Component Value Date/Time   CHOL 200 03/01/2016 1032   TRIG 138 03/01/2016 1032   HDL 66 03/01/2016 1032   CHOLHDL 3.0 03/01/2016 1032   VLDL 28 03/01/2016 1032   LDLCALC 106 03/01/2016 1032  Additional studies/ records that were reviewed today include:   Cath 06/23/2004  CORONARY ARTERIES:  The coronary arteries arise and distribute normally.  1. Right coronary artery is normal.  2. Left main coronary artery is normal.  3. Left circumflex is a moderate size vessel.  It is tortuous but normal.  4. Left anterior descending:  The left anterior descending has somewhat     diffuse 30% plaque in the proximal 1/2.  The distal vessel is relatively     free of disease.  There is no obstructive disease in the left anterior     descending.   LEFT VENTRICULOGRAPHY:  Left ventricular angiogram was performed in the RAO  position.  The overall cardiac size and silhouette were normal.  There was a  hyperdynamic left ventricle with an ejection fraction was 60-70%.  Regional  wall motion was normal.   The thoracic aortic root was somewhat dilated but showed no evidence of  dissection.   OVERALL IMPRESSION:  1. Normal and hyperdynamic left ventricle.  2. Mild single vessel coronary atherosclerosis.  3. Mild dilatation of the aortic root.    Echo 09/04/2017 LV EF: 65% -   70%  Study Conclusions  - Left ventricle: The cavity size was normal. Wall thickness was   increased in a pattern of moderate LVH. Systolic function was   vigorous. The estimated ejection fraction was in the range of 65%   to 70%. Wall motion was normal; there were no regional wall   motion abnormalities. Doppler parameters are  consistent with   abnormal left ventricular relaxation (grade 1 diastolic   dysfunction). Doppler parameters are consistent with high   ventricular filling pressure. - Aortic valve: There was trivial regurgitation. - Ascending aorta: The ascending aorta was mildly dilated. - Mitral valve: Calcified annulus. There was mild regurgitation. - Left atrium: The atrium was mildly dilated. - Pulmonary arteries: Systolic pressure was mildly increased. PA   peak pressure: 35 mm Hg (S).  Impressions:  - Vigorous LV systolic function; mild diastolic dysfunction;   moderate LVH; elevated LV filling pressure; sclerotic aortic   valve with trace AI; mildly dilated ascending aorta (4.5 cm);   suggest CTA or MRA to further assess; mild MR; mild LAE; trace TR   with mildly elevated pulmonary pressure.    Myoview 09/11/2017 Study Highlights     Nuclear stress EF: 74%. No wall motion abnormalities.  There was no ST segment deviation noted during stress.  Defect 1: There is a small defect of mild severity present in the apical lateral location. No ischemia identified  This is a low risk study. Overall normal study.     ASSESSMENT:    1. Chest pain, unspecified type   2. Need for immunization against influenza   3. Thoracic aortic aneurysm without rupture ([place])   4. Essential hypertension   5. Hyperlipidemia, unspecified hyperlipidemia type   6. Coronary artery disease involving native coronary artery of native heart without angina pectoris      PLAN:  In order of problems listed above:  1. Chest pain: Normal echocardiogram and a negative Myoview recently, chest pain resolved since recent hospitalization. She does have chronic GERD, for which she takes Zantac. The symptom only occurs with food and not with exertion. No further ischemic workup planned at this time.  2. Thoracic aortic aneurysm: Recent echocardiogram showed mildly dilated ascending aorta measuring at 4.5 cm. Previous CT  of the chest without contrast obtained on 06/14/2014 showed ascending aortic aneurysm  4.4 cm, no significant change in the last 3 years.  3. CAD: Only 30% proximal LAD diffuse disease on previous cardiac catheterization in 2005.  4. Hypertension: Blood pressure well-controlled 118/62  5. Hyperlipidemia: Continue Pitavastatin, obtain fasting lipid panel and LFTs at primary care provider's office  6. She requested flu shot today as well.    Medication Adjustments/Labs and Tests Ordered: Current medicines are reviewed at length with the patient today.  Concerns regarding medicines are outlined above.  Medication changes, Labs and Tests ordered today are listed in the Patient Instructions below. Patient Instructions  Almyra Deforest, [place] recommends that you continue on your current medications as directed. Please refer to the Current Medication list given to you today.  Almyra Deforest, [place] has recommended that you have FASTING blood work to check cholesterol within the next 3 months.  Your physician wants you to follow-up in: 6 months with Dr. Martinique. You will receive a reminder letter in the mail two months in advance. If you don't receive a letter, please call our office to schedule the follow-up appointment.  ** your previous appointment on October 11, 2017 with Dr. Martinique has been cancelled    Signed, Almyra Deforest, [place]  09/25/2017 5:57 AM    [address], [place], [place]  [address] Phone: [phone]; Fax: [phone]

## 2017-09-25 ENCOUNTER — Encounter: Payer: Self-pay | Admitting: Physician Assistant

## 2017-10-11 ENCOUNTER — Ambulatory Visit: Payer: PPO | Admitting: Cardiology

## 2017-11-06 NOTE — Telephone Encounter (Signed)
done

## 2017-11-25 DIAGNOSIS — Z111 Encounter for screening for respiratory tuberculosis: Secondary | ICD-10-CM | POA: Diagnosis not present

## 2017-11-26 DIAGNOSIS — R82998 Other abnormal findings in urine: Secondary | ICD-10-CM | POA: Diagnosis not present

## 2017-11-26 DIAGNOSIS — M810 Age-related osteoporosis without current pathological fracture: Secondary | ICD-10-CM | POA: Diagnosis not present

## 2017-11-26 DIAGNOSIS — R7301 Impaired fasting glucose: Secondary | ICD-10-CM | POA: Diagnosis not present

## 2017-11-26 DIAGNOSIS — I10 Essential (primary) hypertension: Secondary | ICD-10-CM | POA: Diagnosis not present

## 2017-11-28 DIAGNOSIS — Z029 Encounter for administrative examinations, unspecified: Secondary | ICD-10-CM | POA: Diagnosis not present

## 2017-11-28 DIAGNOSIS — E7849 Other hyperlipidemia: Secondary | ICD-10-CM | POA: Diagnosis not present

## 2017-11-28 DIAGNOSIS — K219 Gastro-esophageal reflux disease without esophagitis: Secondary | ICD-10-CM | POA: Diagnosis not present

## 2017-11-28 DIAGNOSIS — Z6823 Body mass index (BMI) 23.0-23.9, adult: Secondary | ICD-10-CM | POA: Diagnosis not present

## 2017-11-28 DIAGNOSIS — Z23 Encounter for immunization: Secondary | ICD-10-CM | POA: Diagnosis not present

## 2017-11-28 DIAGNOSIS — R42 Dizziness and giddiness: Secondary | ICD-10-CM | POA: Diagnosis not present

## 2017-11-28 DIAGNOSIS — I10 Essential (primary) hypertension: Secondary | ICD-10-CM | POA: Diagnosis not present

## 2017-12-03 DIAGNOSIS — G43909 Migraine, unspecified, not intractable, without status migrainosus: Secondary | ICD-10-CM | POA: Diagnosis not present

## 2017-12-03 DIAGNOSIS — K219 Gastro-esophageal reflux disease without esophagitis: Secondary | ICD-10-CM | POA: Diagnosis not present

## 2017-12-03 DIAGNOSIS — E7849 Other hyperlipidemia: Secondary | ICD-10-CM | POA: Diagnosis not present

## 2017-12-03 DIAGNOSIS — I10 Essential (primary) hypertension: Secondary | ICD-10-CM | POA: Diagnosis not present

## 2017-12-03 DIAGNOSIS — Z1389 Encounter for screening for other disorder: Secondary | ICD-10-CM | POA: Diagnosis not present

## 2017-12-03 DIAGNOSIS — Z6823 Body mass index (BMI) 23.0-23.9, adult: Secondary | ICD-10-CM | POA: Diagnosis not present

## 2017-12-03 DIAGNOSIS — I2510 Atherosclerotic heart disease of native coronary artery without angina pectoris: Secondary | ICD-10-CM | POA: Diagnosis not present

## 2017-12-03 DIAGNOSIS — I712 Thoracic aortic aneurysm, without rupture: Secondary | ICD-10-CM | POA: Diagnosis not present

## 2017-12-03 DIAGNOSIS — R7301 Impaired fasting glucose: Secondary | ICD-10-CM | POA: Diagnosis not present

## 2017-12-03 DIAGNOSIS — Z8673 Personal history of transient ischemic attack (TIA), and cerebral infarction without residual deficits: Secondary | ICD-10-CM | POA: Diagnosis not present

## 2017-12-03 DIAGNOSIS — Z0000 Encounter for general adult medical examination without abnormal findings: Secondary | ICD-10-CM | POA: Diagnosis not present

## 2017-12-03 DIAGNOSIS — M5416 Radiculopathy, lumbar region: Secondary | ICD-10-CM | POA: Diagnosis not present

## 2017-12-06 DIAGNOSIS — Z1212 Encounter for screening for malignant neoplasm of rectum: Secondary | ICD-10-CM | POA: Diagnosis not present

## 2018-03-03 ENCOUNTER — Emergency Department (HOSPITAL_COMMUNITY): Payer: PPO

## 2018-03-03 ENCOUNTER — Encounter (HOSPITAL_COMMUNITY): Payer: Self-pay

## 2018-03-03 ENCOUNTER — Emergency Department (HOSPITAL_COMMUNITY)
Admission: EM | Admit: 2018-03-03 | Discharge: 2018-03-04 | Disposition: A | Discharge status: Home / Self Care | Payer: PPO | Attending: Emergency Medicine | Admitting: Emergency Medicine

## 2018-03-03 DIAGNOSIS — I2510 Atherosclerotic heart disease of native coronary artery without angina pectoris: Secondary | ICD-10-CM | POA: Diagnosis not present

## 2018-03-03 DIAGNOSIS — R079 Chest pain, unspecified: Secondary | ICD-10-CM | POA: Diagnosis not present

## 2018-03-03 DIAGNOSIS — Z87891 Personal history of nicotine dependence: Secondary | ICD-10-CM | POA: Diagnosis not present

## 2018-03-03 DIAGNOSIS — Z79899 Other long term (current) drug therapy: Secondary | ICD-10-CM | POA: Diagnosis not present

## 2018-03-03 DIAGNOSIS — I10 Essential (primary) hypertension: Secondary | ICD-10-CM | POA: Insufficient documentation

## 2018-03-03 DIAGNOSIS — Z7902 Long term (current) use of antithrombotics/antiplatelets: Secondary | ICD-10-CM | POA: Insufficient documentation

## 2018-03-03 DIAGNOSIS — R0789 Other chest pain: Secondary | ICD-10-CM | POA: Insufficient documentation

## 2018-03-03 HISTORY — DX: Cerebral infarction, unspecified: I63.9

## 2018-03-03 LAB — CBC
HCT: 42.1 % (ref 36.0–46.0)
Hemoglobin: 13.7 g/dL (ref 12.0–15.0)
MCH: 30.5 pg (ref 26.0–34.0)
MCHC: 32.5 g/dL (ref 30.0–36.0)
MCV: 93.8 fL (ref 78.0–100.0)
PLATELETS: 208 10*3/uL (ref 150–400)
RBC: 4.49 MIL/uL (ref 3.87–5.11)
RDW: 13.7 % (ref 11.5–15.5)
WBC: 6.3 10*3/uL (ref 4.0–10.5)

## 2018-03-03 LAB — BASIC METABOLIC PANEL
Anion gap: 9 (ref 5–15)
BUN: 16 mg/dL (ref 6–20)
CHLORIDE: 103 mmol/L (ref 101–111)
CO2: 28 mmol/L (ref 22–32)
CREATININE: 0.72 mg/dL (ref 0.44–1.00)
Calcium: 9.4 mg/dL (ref 8.9–10.3)
GFR calc Af Amer: >60 mL/min (ref >60)
GFR calc non Af Amer: >60 mL/min (ref >60)
Glucose, Bld: 108 mg/dL — ABNORMAL HIGH (ref 65–99)
Potassium: 3.6 mmol/L (ref 3.5–5.1)
Sodium: 140 mmol/L (ref 135–145)

## 2018-03-03 LAB — I-STAT TROPONIN, ED: Troponin i, poc: 0 ng/mL (ref 0.00–0.08)

## 2018-03-03 LAB — PROTIME-INR
INR: 1
Prothrombin Time: 13.1 seconds (ref 11.4–15.2)

## 2018-03-03 NOTE — ED Triage Notes (Signed)
Pt arrived via EMS for Chest pain from; pt had sudden onset of mid chest pain radiating to left arm after going to retreive mail; Pt went back to room and called for staff; chest pain subsided and arrives chest pain free; Pt is a&ox 4 on arrival. Pt is allergic to ASA; pt on Plavix for prior TIA; pt states she sometimes amublates with cane-Monique,RN

## 2018-03-03 NOTE — ED Provider Notes (Signed)
[facility] EMERGENCY DEPARTMENT Provider Note   CSN: [id number] Arrival date & time: 03/03/18  2059     History   Chief Complaint Chief Complaint  Patient presents with  . Chest Pain    HPI [name] is a 84 y.o. female aortic aneurysm (4.4 cm, found on CT scan on 06/14/14), CAD, hypertension, hyperlipidemia, TIA who arrives by EMS to the emergency department today for chest pain.  Patient states that around 6:30 PM tonight she was walking from her couch to retrieve mail when she had the sudden onset of mid chest pain that she is unable to describe with radiation to her left arm and left jaw.  She denies any associated shortness of breath, nausea or diaphoresis with this.  Pain lasted for less than 10 minutes and was relieved by rest.  She is allergic to aspirin but is on Plavix daily.  She is asymptomatic when EMS arrived.  Patient denies any dysarthria, dysphasia, facial droop, arm weakness, leg weakness, abdominal pain.  Last heart catheterization was in 2012 which showed nonobstructive CAD.  Patient underwent a echocardiogram on 09/04/17 that showed ejection fraction of 88-50%, grade 1 diastolic dysfunction, mild mitral regurg, mild dilated ascending aorta at 4.5 cm.  Patient underwent a outpatient stress test on 09/11/2017 which showed an ejection fraction of 74%.  There was a small defect of mild severity present with the apical lateral location but no ischemia was identified and it was deemed a overall low risk study.  HPI  Past Medical History:  Diagnosis Date  . Aortic aneurysm, thoracic (HCC)    4.4 cm  . Aortic dilatation ([place])    Seen on cath 2005  . Arthritis   . CAD (coronary artery disease)    a. Nonobstructive by last cath in 05/2004 with 30% LAD. b. EF 65-70% 12/2010. c. Hx ASA intolerance.   . Colitis    12/2010 associated with transient lower GI bleeding/anemia tx with abx  . GERD (gastroesophageal reflux disease)    a. Minimal hiatal hernia on  EGD 2006, no obvious esophageal source for CP at that time.  . Hypercholesteremia    Hx of myalgias felt to be statin related but taking Crestor as OP  . Hypertension   . Osteoporosis   . Stroke [facility])    TIA    Patient Active Problem List   Diagnosis Date Noted  . Chest pain, rule out acute myocardial infarction 09/03/2017  . Heart palpitations 07/31/2016  . Neck pain, musculoskeletal 04/26/2015  . Variants of migraine, not elsewhere classified, without mention of intractable migraine without mention of status migrainosus 08/17/2014  . Tension headache 08/17/2014  . TIA (transient ischemic attack) 07/30/2014  . HTN (hypertension) 07/30/2014  . Aortic aneurysm, thoracic ([place]) 06/25/2014  . Chest pain 07/29/2012  . CAD (coronary artery disease) 06/03/2012  . Hypercholesteremia   . GERD (gastroesophageal reflux disease)     Past Surgical History:  Procedure Laterality Date  . ABDOMINAL HYSTERECTOMY    . BACK SURGERY     Disc disease  . hysterectomy -- unknown type    . SHOULDER SURGERY    . Shunt for syringomyelia 1987    . TEMPORAL ARTERY BIOPSY / LIGATION     Negative 2005    OB History    No data available       Home Medications    Prior to Admission medications   Medication Sig Start Date End Date Taking? Authorizing Provider  Calcium Carbonate-Vitamin  D (CALCIUM + D PO) Take 1 tablet by mouth every morning.     [provider]  Carboxymethylcellul-Glycerin (LUBRICATING EYE DROPS OP) Place 1 drop into both eyes 2 (two) times daily.    [provider]  cholecalciferol (VITAMIN D) 1000 UNITS tablet Take 1,000 Units by mouth daily.    [provider]  clopidogrel (PLAVIX) 75 MG tablet Take 75 mg by mouth daily.    [provider]  LORazepam (ATIVAN) 0.5 MG tablet Take 1 tablet (0.5 mg total) by mouth at bedtime as needed. For sleep. 09/03/15   Darlyne Russian, MD  metoprolol succinate (TOPROL-XL) 25 MG 24 hr tablet Take 12.5 mg by  mouth daily.     [provider]  Multiple Vitamins-Minerals (MULTIVITAMIN PO) Take 1 tablet by mouth daily.     [provider]  Pitavastatin Calcium (LIVALO) 1 MG TABS Take 1 tablet by mouth daily.    [provider]  polyethylene glycol powder (GLYCOLAX/MIRALAX) powder Take 17 g by mouth 2 (two) times daily as needed. Patient taking differently: Take 17 g by mouth 2 (two) times daily as needed for mild constipation.  03/07/15   Roselee Culver, MD  ranitidine (ZANTAC) 150 MG tablet Take 150 mg by mouth at bedtime.     [provider]    Family History Family History  Problem Relation Age of Onset  . Heart disease Mother   . Heart failure Mother   . Arthritis Mother   . Heart disease Brother   . Heart disease Unknown   . Heart failure Unknown     Social History Social History   Tobacco Use  . Smoking status: Former Smoker    Last attempt to quit: 04/26/1959    Years since quitting: 58.8  . Smokeless tobacco: Never Used  Substance Use Topics  . Alcohol use: No    Alcohol/week: 0.6 oz    Types: 1 Glasses of wine per week  . Drug use: No     Allergies   Aspirin; Crestor [rosuvastatin]; Bactrim; Demeclocycline; Niacin; Niacin and related; Penicillins; Relafen [nabumetone]; Sulfa antibiotics; and Tetracyclines & related   Review of Systems Review of Systems  All other systems reviewed and are negative.    Physical Exam Updated Vital Signs BP (!) 127/56   Pulse 64   Temp 97.6 F (36.4 C) (Oral)   Resp 20   Ht 5\' 1"  (1.549 m)   Wt 56.7 kg (125 lb)   SpO2 94%   BMI 23.62 kg/m   Physical Exam  Constitutional: She appears well-developed and well-nourished.  HENT:  Head: Normocephalic and atraumatic.  Right Ear: External ear normal.  Left Ear: External ear normal.  Nose: Nose normal.  Mouth/Throat: Uvula is midline, oropharynx is clear and moist and mucous membranes are normal. No tonsillar exudate.  Eyes: Pupils are equal,  round, and reactive to light. Right eye exhibits no discharge. Left eye exhibits no discharge. No scleral icterus.  Neck: Trachea normal. Neck supple. No spinous process tenderness present. No neck rigidity. Normal range of motion present.  Cardiovascular: Normal rate, regular rhythm and intact distal pulses.  Murmur heard.  Systolic murmur is present. Pulses:      Radial pulses are 2+ on the right side, and 2+ on the left side.       Dorsalis pedis pulses are 2+ on the right side, and 2+ on the left side.       Posterior tibial pulses are 2+ on  the right side, and 2+ on the left side.  No lower extremity swelling or edema. Calves symmetric in size bilaterally.  Pulmonary/Chest: Effort normal and breath sounds normal. She exhibits no tenderness.  Abdominal: Soft. Bowel sounds are normal. There is no tenderness. There is no rebound and no guarding.  Musculoskeletal: She exhibits no edema.  Lymphadenopathy:    She has no cervical adenopathy.  Neurological: She is alert.  Skin: Skin is warm and dry. No rash noted. She is not diaphoretic.  Psychiatric: She has a normal mood and affect.  Nursing note and vitals reviewed.    ED Treatments / Results  Labs (all labs ordered are listed, but only abnormal results are displayed) Labs Reviewed  BASIC METABOLIC PANEL - Abnormal; Notable for the following components:      Result Value   Glucose, Bld 108 (*)    All other components within normal limits  CBC  PROTIME-INR  I-STAT TROPONIN, ED  I-STAT TROPONIN, ED    EKG  EKG Interpretation None       Radiology Dg Chest 2 View  Result Date: 03/03/2018 CLINICAL DATA:  Central chest pain today. History of aortic dilatation, hypertension. EXAM: CHEST  2 VIEW COMPARISON:  Chest radiograph September 03, 2017 FINDINGS: Cardiac silhouette is mildly enlarged. Mitral annular calcifications. Calcified aortic knob. Chronic interstitial changes without pleural effusion or focal consolidation. No  pneumothorax. Osteopenia. Calcification at RIGHT humeral head associated with tendinopathy. IMPRESSION: Stable cardiomegaly and chronic interstitial changes. Aortic Atherosclerosis (ICD10-I70.0). Electronically Signed   By: Elon Alas M.D.   On: 03/03/2018 21:52    Procedures Procedures (including critical care time)  Medications Ordered in ED Medications - No data to display   Initial Impression / Assessment and Plan / ED Course  I have reviewed the triage vital signs and the nursing notes.  Pertinent labs & imaging results that were available during my care of the patient were reviewed by me and considered in my medical decision making (see chart for details).     84 y.o. female aortic aneurysm (4.4 cm, found on CT scan on 06/14/14), CAD, hypertension, hyperlipidemia, TIA who arrives by EMS to the emergency department today for chest pain.  Patient states that around 6:30 PM tonight she was walking from her couch to retrieve mail when she had the sudden onset of mid chest pain that she is unable to describe with radiation to her left arm and left jaw.  She denies any associated shortness of breath, nausea or diaphoresis with this.  Pain lasted for less than 10 minutes and was relieved by rest.  She is allergic to aspirin but is on Plavix daily.  She is asymptomatic when EMS arrived.  Vital signs are reassuring. Exam reassuring as above.   Last heart catheterization was in 2012 which showed nonobstructive CAD.  Patient underwent a echocardiogram on 09/04/17 that showed ejection fraction of 22-02%, grade 1 diastolic dysfunction, mild mitral regurg, mild dilated ascending aorta at 4.5 cm.  Patient underwent a outpatient stress test on 09/11/2017 which showed an ejection fraction of 74%.  There was a small defect of mild severity present with the apical lateral location but no ischemia was identified and it was deemed a overall low risk study.  Patient without ischemic changes on ecg. First Tn  0.00.  Patient without electrolyte abnormalities.  No acute kidney injury.  No leukocytosis.  No anemia.  Patient with stable cardiomegaly and chronic changes.  No focal consolidation.  Patient remains chest  pain-free in the department.  Will cycle troponin.   Second Tn 0.00. She has remained chest pain free in the department. Requesting to go home. Patient with recent stress test and Echo in the last year that were reassuring. Do not feel the patient needs admission for further workup of her episode of chest pain. I advised the patient to follow-up with PCP this week. Specific return precautions discussed. Time was given for all questions to be answered. The patient verbalized understanding and agreement with plan. The patient appears safe for discharge home.  Patient case seen and discussed with Dr. Tyrone Nine who is in agreement with plan.   Final Clinical Impressions(s) / ED Diagnoses   Final diagnoses:  Atypical chest pain    ED Discharge Orders    None       Jillyn Ledger, PA-C 03/04/18 [place], [place], DO 03/04/18 [phone]

## 2018-03-03 NOTE — ED Notes (Signed)
Patient transported to X-ray 

## 2018-03-04 LAB — I-STAT TROPONIN, ED: TROPONIN I, POC: 0 ng/mL (ref 0.00–0.08)

## 2018-03-04 NOTE — Discharge Instructions (Addendum)
Read instructions below for reasons to return to the Emergency Department. It is recommended that your follow up with your Primary Care Doctor in regards to today's visit. If you do not have a doctor, use the resource guide listed below to help you find one.   Tests performed today include: An EKG of your heart A chest x-ray Cardiac enzymes - a blood test for heart muscle damage Blood counts and electrolytes Vital signs. See below for your results today.   Chest Pain (Nonspecific)  HOME CARE INSTRUCTIONS  For the next few days, avoid physical activities that bring on chest pain. Continue physical activities as directed.  Do not smoke cigarettes or drink alcohol until your symptoms are gone. If you do smoke, it is time to quit. You may receive instructions and counseling on how to stop smoking. Only take over-the-counter or prescription medicine for pain, discomfort, or fever as directed by your caregiver.  Follow your caregiver's suggestions for further testing if your chest pain does not go away.  Keep any follow-up appointments you made. If you do not go to an appointment, you could develop lasting (chronic) problems with pain. If there is any problem keeping an appointment, you must call to reschedule.  SEEK MEDICAL CARE IF:  You think you are having problems from the medicine you are taking. Read your medicine instructions carefully.  Your chest pain does not go away, even after treatment.  You develop a rash with blisters on your chest.  SEEK IMMEDIATE MEDICAL CARE IF:  You have increased chest pain or pain that spreads to your arm, neck, jaw, back, or belly (abdomen).  You develop shortness of breath, an increasing cough, or you are coughing up blood.  You have severe back or abdominal pain, feel sick to your stomach (nauseous) or throw up (vomit).  You develop severe weakness, fainting, or chills.  You have an oral temperature above 102 F (38.9 C), not controlled by medicine.  THIS  IS AN EMERGENCY. Do not wait to see if the pain will go away. Get medical help at once. Call your local emergency services (911 in [place] Do not drive yourself to the hospital. Additional Information:  Your vital signs today were: BP (!) 134/58    Pulse 61    Temp 97.6 F (36.4 C) (Oral)    Resp 16    Ht 5\' 1"  (1.549 m)    Wt 56.7 kg (125 lb)    SpO2 96%    BMI 23.62 kg/m  If your blood pressure (BP) was elevated above 135/85 this visit, please have this repeated by your doctor within one month. ---------------

## 2018-05-29 DIAGNOSIS — M5441 Lumbago with sciatica, right side: Secondary | ICD-10-CM | POA: Diagnosis not present

## 2018-05-29 DIAGNOSIS — M533 Sacrococcygeal disorders, not elsewhere classified: Secondary | ICD-10-CM | POA: Diagnosis not present

## 2018-05-29 DIAGNOSIS — M545 Low back pain: Secondary | ICD-10-CM | POA: Diagnosis not present

## 2018-07-03 DIAGNOSIS — R7612 Nonspecific reaction to cell mediated immunity measurement of gamma interferon antigen response without active tuberculosis: Secondary | ICD-10-CM | POA: Diagnosis not present

## 2018-07-10 DIAGNOSIS — M1712 Unilateral primary osteoarthritis, left knee: Secondary | ICD-10-CM | POA: Diagnosis not present

## 2018-07-10 DIAGNOSIS — M25562 Pain in left knee: Secondary | ICD-10-CM | POA: Diagnosis not present

## 2018-07-31 DIAGNOSIS — Z6824 Body mass index (BMI) 24.0-24.9, adult: Secondary | ICD-10-CM | POA: Diagnosis not present

## 2018-07-31 DIAGNOSIS — Z1389 Encounter for screening for other disorder: Secondary | ICD-10-CM | POA: Diagnosis not present

## 2018-07-31 DIAGNOSIS — E7849 Other hyperlipidemia: Secondary | ICD-10-CM | POA: Diagnosis not present

## 2018-07-31 DIAGNOSIS — G43909 Migraine, unspecified, not intractable, without status migrainosus: Secondary | ICD-10-CM | POA: Diagnosis not present

## 2018-07-31 DIAGNOSIS — K219 Gastro-esophageal reflux disease without esophagitis: Secondary | ICD-10-CM | POA: Diagnosis not present

## 2018-07-31 DIAGNOSIS — R7301 Impaired fasting glucose: Secondary | ICD-10-CM | POA: Diagnosis not present

## 2018-07-31 DIAGNOSIS — I2510 Atherosclerotic heart disease of native coronary artery without angina pectoris: Secondary | ICD-10-CM | POA: Diagnosis not present

## 2018-07-31 DIAGNOSIS — I10 Essential (primary) hypertension: Secondary | ICD-10-CM | POA: Diagnosis not present

## 2018-07-31 DIAGNOSIS — I779 Disorder of arteries and arterioles, unspecified: Secondary | ICD-10-CM | POA: Diagnosis not present

## 2018-07-31 DIAGNOSIS — I712 Thoracic aortic aneurysm, without rupture: Secondary | ICD-10-CM | POA: Diagnosis not present

## 2018-07-31 DIAGNOSIS — M810 Age-related osteoporosis without current pathological fracture: Secondary | ICD-10-CM | POA: Diagnosis not present

## 2018-07-31 DIAGNOSIS — M5416 Radiculopathy, lumbar region: Secondary | ICD-10-CM | POA: Diagnosis not present

## 2018-08-13 ENCOUNTER — Encounter (HOSPITAL_COMMUNITY): Payer: Self-pay | Admitting: Emergency Medicine

## 2018-08-13 ENCOUNTER — Emergency Department (HOSPITAL_COMMUNITY): Payer: PPO

## 2018-08-13 ENCOUNTER — Emergency Department (HOSPITAL_COMMUNITY)
Admission: EM | Admit: 2018-08-13 | Discharge: 2018-08-14 | Disposition: A | Discharge status: Home / Self Care | Payer: PPO | Attending: Emergency Medicine | Admitting: Emergency Medicine

## 2018-08-13 DIAGNOSIS — Z79899 Other long term (current) drug therapy: Secondary | ICD-10-CM | POA: Diagnosis not present

## 2018-08-13 DIAGNOSIS — I2510 Atherosclerotic heart disease of native coronary artery without angina pectoris: Secondary | ICD-10-CM | POA: Diagnosis not present

## 2018-08-13 DIAGNOSIS — R0602 Shortness of breath: Secondary | ICD-10-CM | POA: Diagnosis not present

## 2018-08-13 DIAGNOSIS — Z87891 Personal history of nicotine dependence: Secondary | ICD-10-CM | POA: Diagnosis not present

## 2018-08-13 DIAGNOSIS — I10 Essential (primary) hypertension: Secondary | ICD-10-CM | POA: Diagnosis not present

## 2018-08-13 DIAGNOSIS — Z7902 Long term (current) use of antithrombotics/antiplatelets: Secondary | ICD-10-CM | POA: Diagnosis not present

## 2018-08-13 DIAGNOSIS — Z7982 Long term (current) use of aspirin: Secondary | ICD-10-CM | POA: Diagnosis not present

## 2018-08-13 DIAGNOSIS — R079 Chest pain, unspecified: Secondary | ICD-10-CM | POA: Diagnosis not present

## 2018-08-13 DIAGNOSIS — R072 Precordial pain: Secondary | ICD-10-CM | POA: Diagnosis not present

## 2018-08-13 DIAGNOSIS — E7800 Pure hypercholesterolemia, unspecified: Secondary | ICD-10-CM | POA: Diagnosis not present

## 2018-08-13 DIAGNOSIS — Z8673 Personal history of transient ischemic attack (TIA), and cerebral infarction without residual deficits: Secondary | ICD-10-CM | POA: Diagnosis not present

## 2018-08-13 DIAGNOSIS — R0902 Hypoxemia: Secondary | ICD-10-CM | POA: Diagnosis not present

## 2018-08-13 LAB — CBC
HCT: 43.6 % (ref 36.0–46.0)
Hemoglobin: 14.1 g/dL (ref 12.0–15.0)
MCH: 30.9 pg (ref 26.0–34.0)
MCHC: 32.3 g/dL (ref 30.0–36.0)
MCV: 95.6 fL (ref 78.0–100.0)
PLATELETS: 234 10*3/uL (ref 150–400)
RBC: 4.56 MIL/uL (ref 3.87–5.11)
RDW: 12.6 % (ref 11.5–15.5)
WBC: 6.6 10*3/uL (ref 4.0–10.5)

## 2018-08-13 LAB — BASIC METABOLIC PANEL
Anion gap: 8 (ref 5–15)
BUN: 13 mg/dL (ref 8–23)
CALCIUM: 10.3 mg/dL (ref 8.9–10.3)
CO2: 30 mmol/L (ref 22–32)
CREATININE: 0.78 mg/dL (ref 0.44–1.00)
Chloride: 103 mmol/L (ref 98–111)
GFR calc Af Amer: >60 mL/min (ref >60)
GFR calc non Af Amer: >60 mL/min (ref >60)
GLUCOSE: 108 mg/dL — AB (ref 70–99)
Potassium: 3.9 mmol/L (ref 3.5–5.1)
Sodium: 141 mmol/L (ref 135–145)

## 2018-08-13 LAB — I-STAT TROPONIN, ED: TROPONIN I, POC: 0.01 ng/mL (ref 0.00–0.08)

## 2018-08-13 NOTE — ED Notes (Signed)
Urine sample collected and remains in back of pt wheelchair if needed

## 2018-08-13 NOTE — ED Triage Notes (Signed)
BIB EMS from [facility]. Pt reports central CP/indigestion X3 days, hx GERD and states pain usually subsides when she takes Tums but has not this time. Also reports SOB with exertion and lightheadedness. VSS.

## 2018-08-13 NOTE — ED Provider Notes (Signed)
  Face-to-face evaluation   History: Patient presents for evaluation of chest pain which feels like indigestion.  She has tried Tums which usually helps but did not this time.  Last cardiac catheterization 2005.  She had nonobstructive disease at that time.  Physical exam: Alert elderly female who is comfortable.  Heart regular rate and rhythm with soft systolic murmur.  Lungs clear anteriorly.  She is lucid.   Medical screening examination/treatment/procedure(s) were conducted as a shared visit with non-physician practitioner(s) and myself.  I personally evaluated the patient during the encounter    Daleen Bo, MD 08/14/18 [phone]

## 2018-08-14 LAB — TROPONIN I: Troponin I: <0.03 ng/mL (ref <0.03)

## 2018-08-14 NOTE — ED Provider Notes (Signed)
[facility] EMERGENCY DEPARTMENT Provider Note   CSN: [id number] Arrival date & time: 08/13/18  2104     History   Chief Complaint Chief Complaint  Patient presents with  . Chest Pain    HPI [name] is a 84 y.o. female.  The history is provided by the patient. No language interpreter was used.  Chest Pain   This is a new problem. Episode onset: 3 days. The problem has not changed since onset.The pain is associated with rest. The pain is present in the substernal region. The pain is mild. The quality of the pain is described as brief. The pain does not radiate. She has tried nothing for the symptoms. There are no known risk factors.    Past Medical History:  Diagnosis Date  . Aortic aneurysm, thoracic (HCC)    4.4 cm  . Aortic dilatation ([place])    Seen on cath 2005  . Arthritis   . CAD (coronary artery disease)    a. Nonobstructive by last cath in 05/2004 with 30% LAD. b. EF 65-70% 12/2010. c. Hx ASA intolerance.   . Colitis    12/2010 associated with transient lower GI bleeding/anemia tx with abx  . GERD (gastroesophageal reflux disease)    a. Minimal hiatal hernia on EGD 2006, no obvious esophageal source for CP at that time.  . Hypercholesteremia    Hx of myalgias felt to be statin related but taking Crestor as OP  . Hypertension   . Osteoporosis   . Stroke [facility])    TIA    Patient Active Problem List   Diagnosis Date Noted  . Chest pain, rule out acute myocardial infarction 09/03/2017  . Heart palpitations 07/31/2016  . Neck pain, musculoskeletal 04/26/2015  . Variants of migraine, not elsewhere classified, without mention of intractable migraine without mention of status migrainosus 08/17/2014  . Tension headache 08/17/2014  . TIA (transient ischemic attack) 07/30/2014  . HTN (hypertension) 07/30/2014  . Aortic aneurysm, thoracic ([place]) 06/25/2014  . Chest pain 07/29/2012  . CAD (coronary artery disease) 06/03/2012  .  Hypercholesteremia   . GERD (gastroesophageal reflux disease)     Past Surgical History:  Procedure Laterality Date  . ABDOMINAL HYSTERECTOMY    . BACK SURGERY     Disc disease  . hysterectomy -- unknown type    . SHOULDER SURGERY    . Shunt for syringomyelia 1987    . TEMPORAL ARTERY BIOPSY / LIGATION     Negative 2005     OB History   None      Home Medications    Prior to Admission medications   Medication Sig Start Date End Date Taking? Authorizing Provider  acetaminophen (TYLENOL) 500 MG tablet Take 500 mg by mouth 3 (three) times daily as needed for mild pain.   Yes [provider]  aspirin 81 MG chewable tablet Chew 81 mg by mouth daily.   Yes [provider]  Calcium Carb-Cholecalciferol (CALCIUM-VITAMIN D) 500-400 MG-UNIT TABS Take 1 tablet by mouth daily.   Yes [provider]  cholecalciferol (VITAMIN D) 1000 UNITS tablet Take 2,000 Units by mouth daily.    Yes [provider]  clopidogrel (PLAVIX) 75 MG tablet Take 75 mg by mouth daily.   Yes [provider]  hydrocortisone (ANUSOL-HC) 2.5 % rectal cream Place 1 application rectally 2 (two) times daily as needed for hemorrhoids or anal itching.   Yes [provider]  LORazepam (ATIVAN) 0.5 MG tablet Take  1 tablet (0.5 mg total) by mouth at bedtime as needed. For sleep. Patient taking differently: Take 0.5 mg by mouth daily.  09/03/15  Yes Darlyne Russian, MD  metoprolol succinate (TOPROL-XL) 25 MG 24 hr tablet Take 25 mg by mouth daily.   Yes [provider]  Multiple Vitamin (MULTIVITAMIN WITH MINERALS) TABS tablet Take 1 tablet by mouth daily.   Yes [provider]  Pitavastatin Calcium (LIVALO) 1 MG TABS Take 1 mg by mouth daily.   Yes [provider]  Polyethyl Glycol-Propyl Glycol (SYSTANE OP) Apply 1-2 drops to eye 2 (two) times daily.   Yes [provider]  polyethylene glycol powder (GLYCOLAX/MIRALAX) powder Take 17 g by  mouth 2 (two) times daily as needed. Patient taking differently: Take 17 g by mouth daily as needed for mild constipation.  03/07/15  Yes Roselee Culver, MD  ranitidine (ZANTAC) 150 MG tablet Take 150 mg by mouth at bedtime.    Yes [provider]    Family History Family History  Problem Relation Age of Onset  . Heart disease Mother   . Heart failure Mother   . Arthritis Mother   . Heart disease Brother   . Heart disease Unknown   . Heart failure Unknown     Social History Social History   Tobacco Use  . Smoking status: Former Smoker    Last attempt to quit: 04/26/1959    Years since quitting: 59.3  . Smokeless tobacco: Never Used  Substance Use Topics  . Alcohol use: No    Alcohol/week: 1.0 standard drinks    Types: 1 Glasses of wine per week  . Drug use: No     Allergies   Aspirin; Crestor [rosuvastatin]; Bactrim; Demeclocycline; Niacin; Niacin and related; Penicillins; Relafen [nabumetone]; Sulfa antibiotics; and Tetracyclines & related   Review of Systems Review of Systems  Cardiovascular: Positive for chest pain.  All other systems reviewed and are negative.    Physical Exam Updated Vital Signs BP (!) 147/60   Pulse (!) 57   Temp 98.4 F (36.9 C) (Oral)   Resp (!) 25   Ht 5' (1.524 m)   Wt 56.2 kg   SpO2 93%   BMI 24.22 kg/m   Physical Exam  Constitutional: She appears well-developed and well-nourished.  HENT:  Head: Normocephalic and atraumatic.  Eyes: Pupils are equal, round, and reactive to light. EOM are normal.  Neck: Normal range of motion.  Cardiovascular: Normal rate, regular rhythm, intact distal pulses and normal pulses.  Murmur heard. Pulmonary/Chest: Effort normal.  Abdominal: Soft.  Musculoskeletal: Normal range of motion.       Right lower leg: Normal.       Left lower leg: Normal.  Neurological: She is alert.  Skin: Skin is warm.  Psychiatric: She has a normal mood and affect.  Nursing note and vitals  reviewed.    ED Treatments / Results  Labs (all labs ordered are listed, but only abnormal results are displayed) Labs Reviewed  BASIC METABOLIC PANEL - Abnormal; Notable for the following components:      Result Value   Glucose, Bld 108 (*)    All other components within normal limits  CBC  TROPONIN I  I-STAT TROPONIN, ED    EKG EKG Interpretation  Date/Time:  Wednesday August 13 2018 21:16:39 EDT Ventricular Rate:  66 PR Interval:  178 QRS Duration: 72 QT Interval:  422 QTC Calculation: 442 R Axis:   -50 Text Interpretation:  Normal  sinus rhythm Left anterior fascicular block Anterolateral infarct , age undetermined Abnormal ECG since last tracing no significant change Confirmed by Daleen Bo [phone]) on 08/13/2018 11:17:30 PM   Radiology Dg Chest 2 View  Result Date: 08/13/2018 CLINICAL DATA:  Initial evaluation for acute chest pain. EXAM: CHEST - 2 VIEW COMPARISON:  Prior radiograph from 03/03/2018. FINDINGS: Cardiomegaly, stable. Mediastinal silhouette within normal limits. Aortic atherosclerosis. Lungs normally inflated. Chronic coarsening of the interstitial markings. No focal infiltrates. No pulmonary edema or pleural effusion. No pneumothorax. No acute osseous abnormality. Mild wedging deformity within the lower lumbar spine is chronic in appearance. Osteopenia. IMPRESSION: 1. No active cardiopulmonary disease. 2. Stable cardiomegaly without edema. 3. Aortic atherosclerosis. Electronically Signed   By: Jeannine Boga M.D.   On: 08/13/2018 21:39    Procedures Procedures (including critical care time)  Medications Ordered in ED Medications - No data to display   Initial Impression / Assessment and Plan / ED Course  I have reviewed the triage vital signs and the nursing notes.  Pertinent labs & imaging results that were available during my care of the patient were reviewed by me and considered in my medical decision making (see chart for details).   MDM   Troponin negative x 2.  Pt advised to increase zantac to 150mg  bid.      Final Clinical Impressions(s) / ED Diagnoses   Final diagnoses:  Chest pain, unspecified type    ED Discharge Orders    None    An After Visit Summary was printed and given to the patient.    Fransico Meadow, PA-C 08/14/18 0111    Daleen Bo, MD 08/14/18 [phone]

## 2018-08-14 NOTE — Discharge Instructions (Addendum)
Increase zantac to 150 mg twice a day.

## 2018-08-20 DIAGNOSIS — H02831 Dermatochalasis of right upper eyelid: Secondary | ICD-10-CM | POA: Diagnosis not present

## 2018-08-20 DIAGNOSIS — H353131 Nonexudative age-related macular degeneration, bilateral, early dry stage: Secondary | ICD-10-CM | POA: Diagnosis not present

## 2018-08-20 DIAGNOSIS — H04123 Dry eye syndrome of bilateral lacrimal glands: Secondary | ICD-10-CM | POA: Diagnosis not present

## 2018-08-20 DIAGNOSIS — H02834 Dermatochalasis of left upper eyelid: Secondary | ICD-10-CM | POA: Diagnosis not present

## 2018-08-20 DIAGNOSIS — H40013 Open angle with borderline findings, low risk, bilateral: Secondary | ICD-10-CM | POA: Diagnosis not present

## 2018-08-27 DIAGNOSIS — H6983 Other specified disorders of Eustachian tube, bilateral: Secondary | ICD-10-CM | POA: Diagnosis not present

## 2018-08-27 DIAGNOSIS — J301 Allergic rhinitis due to pollen: Secondary | ICD-10-CM | POA: Diagnosis not present

## 2018-09-25 DIAGNOSIS — L821 Other seborrheic keratosis: Secondary | ICD-10-CM | POA: Diagnosis not present

## 2018-09-25 DIAGNOSIS — L570 Actinic keratosis: Secondary | ICD-10-CM | POA: Diagnosis not present

## 2018-12-11 DIAGNOSIS — R42 Dizziness and giddiness: Secondary | ICD-10-CM | POA: Diagnosis not present

## 2018-12-11 DIAGNOSIS — I10 Essential (primary) hypertension: Secondary | ICD-10-CM | POA: Diagnosis not present

## 2018-12-11 DIAGNOSIS — Z6825 Body mass index (BMI) 25.0-25.9, adult: Secondary | ICD-10-CM | POA: Diagnosis not present

## 2018-12-11 DIAGNOSIS — R609 Edema, unspecified: Secondary | ICD-10-CM | POA: Diagnosis not present

## 2018-12-11 DIAGNOSIS — R0609 Other forms of dyspnea: Secondary | ICD-10-CM | POA: Diagnosis not present

## 2018-12-15 DIAGNOSIS — R0600 Dyspnea, unspecified: Secondary | ICD-10-CM | POA: Diagnosis not present

## 2019-01-15 DIAGNOSIS — L570 Actinic keratosis: Secondary | ICD-10-CM | POA: Diagnosis not present

## 2019-01-15 DIAGNOSIS — B351 Tinea unguium: Secondary | ICD-10-CM | POA: Diagnosis not present

## 2019-01-15 DIAGNOSIS — D485 Neoplasm of uncertain behavior of skin: Secondary | ICD-10-CM | POA: Diagnosis not present

## 2019-01-15 DIAGNOSIS — Z23 Encounter for immunization: Secondary | ICD-10-CM | POA: Diagnosis not present

## 2019-01-27 DIAGNOSIS — E7849 Other hyperlipidemia: Secondary | ICD-10-CM | POA: Diagnosis not present

## 2019-01-27 DIAGNOSIS — M810 Age-related osteoporosis without current pathological fracture: Secondary | ICD-10-CM | POA: Diagnosis not present

## 2019-01-27 DIAGNOSIS — I2510 Atherosclerotic heart disease of native coronary artery without angina pectoris: Secondary | ICD-10-CM | POA: Diagnosis not present

## 2019-01-27 DIAGNOSIS — Z6825 Body mass index (BMI) 25.0-25.9, adult: Secondary | ICD-10-CM | POA: Diagnosis not present

## 2019-01-27 DIAGNOSIS — I712 Thoracic aortic aneurysm, without rupture: Secondary | ICD-10-CM | POA: Diagnosis not present

## 2019-01-27 DIAGNOSIS — I779 Disorder of arteries and arterioles, unspecified: Secondary | ICD-10-CM | POA: Diagnosis not present

## 2019-01-27 DIAGNOSIS — K219 Gastro-esophageal reflux disease without esophagitis: Secondary | ICD-10-CM | POA: Diagnosis not present

## 2019-01-27 DIAGNOSIS — R7301 Impaired fasting glucose: Secondary | ICD-10-CM | POA: Diagnosis not present

## 2019-01-27 DIAGNOSIS — I10 Essential (primary) hypertension: Secondary | ICD-10-CM | POA: Diagnosis not present

## 2019-02-10 ENCOUNTER — Other Ambulatory Visit: Payer: Self-pay

## 2019-02-10 ENCOUNTER — Emergency Department (HOSPITAL_COMMUNITY): Payer: PPO

## 2019-02-10 ENCOUNTER — Encounter (HOSPITAL_COMMUNITY): Payer: Self-pay | Admitting: Emergency Medicine

## 2019-02-10 ENCOUNTER — Inpatient Hospital Stay (HOSPITAL_COMMUNITY)
Admission: EM | Admit: 2019-02-10 | Discharge: 2019-02-12 | DRG: 689 | Disposition: A | Discharge status: Home Health | Payer: PPO | Attending: Internal Medicine | Admitting: Internal Medicine

## 2019-02-10 DIAGNOSIS — Z880 Allergy status to penicillin: Secondary | ICD-10-CM

## 2019-02-10 DIAGNOSIS — I10 Essential (primary) hypertension: Secondary | ICD-10-CM | POA: Diagnosis present

## 2019-02-10 DIAGNOSIS — E785 Hyperlipidemia, unspecified: Secondary | ICD-10-CM | POA: Diagnosis present

## 2019-02-10 DIAGNOSIS — Z66 Do not resuscitate: Secondary | ICD-10-CM | POA: Diagnosis not present

## 2019-02-10 DIAGNOSIS — Z87891 Personal history of nicotine dependence: Secondary | ICD-10-CM | POA: Diagnosis not present

## 2019-02-10 DIAGNOSIS — E876 Hypokalemia: Secondary | ICD-10-CM | POA: Diagnosis not present

## 2019-02-10 DIAGNOSIS — R0902 Hypoxemia: Secondary | ICD-10-CM | POA: Diagnosis not present

## 2019-02-10 DIAGNOSIS — N3000 Acute cystitis without hematuria: Secondary | ICD-10-CM | POA: Diagnosis not present

## 2019-02-10 DIAGNOSIS — K219 Gastro-esophageal reflux disease without esophagitis: Secondary | ICD-10-CM | POA: Diagnosis present

## 2019-02-10 DIAGNOSIS — S0990XA Unspecified injury of head, initial encounter: Secondary | ICD-10-CM | POA: Diagnosis not present

## 2019-02-10 DIAGNOSIS — Y92099 Unspecified place in other non-institutional residence as the place of occurrence of the external cause: Secondary | ICD-10-CM

## 2019-02-10 DIAGNOSIS — B9620 Unspecified Escherichia coli [E. coli] as the cause of diseases classified elsewhere: Secondary | ICD-10-CM | POA: Diagnosis present

## 2019-02-10 DIAGNOSIS — Z90710 Acquired absence of both cervix and uterus: Secondary | ICD-10-CM | POA: Diagnosis not present

## 2019-02-10 DIAGNOSIS — R410 Disorientation, unspecified: Secondary | ICD-10-CM | POA: Diagnosis not present

## 2019-02-10 DIAGNOSIS — G9340 Encephalopathy, unspecified: Secondary | ICD-10-CM | POA: Diagnosis present

## 2019-02-10 DIAGNOSIS — R4182 Altered mental status, unspecified: Secondary | ICD-10-CM

## 2019-02-10 DIAGNOSIS — W19XXXA Unspecified fall, initial encounter: Secondary | ICD-10-CM | POA: Diagnosis not present

## 2019-02-10 DIAGNOSIS — Z7902 Long term (current) use of antithrombotics/antiplatelets: Secondary | ICD-10-CM | POA: Diagnosis not present

## 2019-02-10 DIAGNOSIS — R509 Fever, unspecified: Secondary | ICD-10-CM | POA: Diagnosis not present

## 2019-02-10 DIAGNOSIS — M1990 Unspecified osteoarthritis, unspecified site: Secondary | ICD-10-CM | POA: Diagnosis present

## 2019-02-10 DIAGNOSIS — N390 Urinary tract infection, site not specified: Secondary | ICD-10-CM | POA: Diagnosis not present

## 2019-02-10 DIAGNOSIS — I2510 Atherosclerotic heart disease of native coronary artery without angina pectoris: Secondary | ICD-10-CM | POA: Diagnosis present

## 2019-02-10 DIAGNOSIS — E7800 Pure hypercholesterolemia, unspecified: Secondary | ICD-10-CM | POA: Diagnosis present

## 2019-02-10 DIAGNOSIS — Z8673 Personal history of transient ischemic attack (TIA), and cerebral infarction without residual deficits: Secondary | ICD-10-CM | POA: Diagnosis not present

## 2019-02-10 DIAGNOSIS — M810 Age-related osteoporosis without current pathological fracture: Secondary | ICD-10-CM | POA: Diagnosis not present

## 2019-02-10 DIAGNOSIS — Z8261 Family history of arthritis: Secondary | ICD-10-CM

## 2019-02-10 DIAGNOSIS — Z881 Allergy status to other antibiotic agents status: Secondary | ICD-10-CM

## 2019-02-10 DIAGNOSIS — G4700 Insomnia, unspecified: Secondary | ICD-10-CM

## 2019-02-10 DIAGNOSIS — I712 Thoracic aortic aneurysm, without rupture: Secondary | ICD-10-CM | POA: Diagnosis not present

## 2019-02-10 DIAGNOSIS — Z79899 Other long term (current) drug therapy: Secondary | ICD-10-CM | POA: Diagnosis not present

## 2019-02-10 DIAGNOSIS — Z888 Allergy status to other drugs, medicaments and biological substances status: Secondary | ICD-10-CM

## 2019-02-10 DIAGNOSIS — G9341 Metabolic encephalopathy: Secondary | ICD-10-CM | POA: Diagnosis present

## 2019-02-10 DIAGNOSIS — Z882 Allergy status to sulfonamides status: Secondary | ICD-10-CM | POA: Diagnosis not present

## 2019-02-10 DIAGNOSIS — Z886 Allergy status to analgesic agent status: Secondary | ICD-10-CM | POA: Diagnosis not present

## 2019-02-10 DIAGNOSIS — Z8249 Family history of ischemic heart disease and other diseases of the circulatory system: Secondary | ICD-10-CM | POA: Diagnosis not present

## 2019-02-10 DIAGNOSIS — I959 Hypotension, unspecified: Secondary | ICD-10-CM | POA: Diagnosis not present

## 2019-02-10 DIAGNOSIS — S199XXA Unspecified injury of neck, initial encounter: Secondary | ICD-10-CM | POA: Diagnosis not present

## 2019-02-10 LAB — COMPREHENSIVE METABOLIC PANEL
ALBUMIN: 3.5 g/dL (ref 3.5–5.0)
ALT: 16 U/L (ref 0–44)
AST: 33 U/L (ref 15–41)
Alkaline Phosphatase: 36 U/L — ABNORMAL LOW (ref 38–126)
Anion gap: 12 (ref 5–15)
BUN: 15 mg/dL (ref 8–23)
CO2: 25 mmol/L (ref 22–32)
Calcium: 9.3 mg/dL (ref 8.9–10.3)
Chloride: 107 mmol/L (ref 98–111)
Creatinine, Ser: 0.7 mg/dL (ref 0.44–1.00)
GFR calc Af Amer: >60 mL/min (ref >60)
GFR calc non Af Amer: >60 mL/min (ref >60)
GLUCOSE: 125 mg/dL — AB (ref 70–99)
Potassium: 3.2 mmol/L — ABNORMAL LOW (ref 3.5–5.1)
Sodium: 144 mmol/L (ref 135–145)
Total Bilirubin: 1.4 mg/dL — ABNORMAL HIGH (ref 0.3–1.2)
Total Protein: 6.2 g/dL — ABNORMAL LOW (ref 6.5–8.1)

## 2019-02-10 LAB — INFLUENZA PANEL BY PCR (TYPE A & B)
Influenza A By PCR: NEGATIVE
Influenza B By PCR: NEGATIVE

## 2019-02-10 LAB — URINALYSIS, ROUTINE W REFLEX MICROSCOPIC
Bilirubin Urine: NEGATIVE
Glucose, UA: NEGATIVE mg/dL
Ketones, ur: 20 mg/dL — AB
Nitrite: POSITIVE — AB
PH: 5 (ref 5.0–8.0)
Protein, ur: NEGATIVE mg/dL
SPECIFIC GRAVITY, URINE: 1.021 (ref 1.005–1.030)

## 2019-02-10 LAB — LACTIC ACID, PLASMA: LACTIC ACID, VENOUS: 1.8 mmol/L (ref 0.5–1.9)

## 2019-02-10 LAB — CBC WITH DIFFERENTIAL/PLATELET
Abs Immature Granulocytes: 0.2 10*3/uL — ABNORMAL HIGH (ref 0.00–0.07)
Band Neutrophils: 2 %
Basophils Absolute: 0 10*3/uL (ref 0.0–0.1)
Basophils Relative: 0 %
Eosinophils Absolute: 0 10*3/uL (ref 0.0–0.5)
Eosinophils Relative: 0 %
HCT: 40.6 % (ref 36.0–46.0)
Hemoglobin: 13.2 g/dL (ref 12.0–15.0)
Lymphocytes Relative: 5 %
Lymphs Abs: 0.4 10*3/uL — ABNORMAL LOW (ref 0.7–4.0)
MCH: 30.1 pg (ref 26.0–34.0)
MCHC: 32.5 g/dL (ref 30.0–36.0)
MCV: 92.7 fL (ref 80.0–100.0)
Monocytes Absolute: 0.5 10*3/uL (ref 0.1–1.0)
Monocytes Relative: 6 %
Myelocytes: 3 %
Neutro Abs: 7 10*3/uL (ref 1.7–7.7)
Neutrophils Relative %: 84 %
Platelets: 179 10*3/uL (ref 150–400)
RBC: 4.38 MIL/uL (ref 3.87–5.11)
RDW: 12.8 % (ref 11.5–15.5)
WBC: 8.1 10*3/uL (ref 4.0–10.5)
nRBC: 0 % (ref 0.0–0.2)
nRBC: 0 /100 WBC

## 2019-02-10 LAB — MRSA PCR SCREENING: MRSA by PCR: NEGATIVE

## 2019-02-10 MED ORDER — ENOXAPARIN SODIUM 40 MG/0.4ML ~~LOC~~ SOLN
40.0000 mg | SUBCUTANEOUS | Status: DC
  Administered 2019-02-10 – 2019-02-11 (×2): 40 mg via SUBCUTANEOUS
  Filled 2019-02-10 (×3): qty 0.4

## 2019-02-10 MED ORDER — CLOPIDOGREL BISULFATE 75 MG PO TABS
75.0000 mg | ORAL_TABLET | Freq: Every day | ORAL | Status: DC
  Administered 2019-02-10 – 2019-02-12 (×3): 75 mg via ORAL
  Filled 2019-02-10 (×4): qty 1

## 2019-02-10 MED ORDER — ACETAMINOPHEN 500 MG PO TABS: 500.0000 mg | ORAL_TABLET | Freq: Three times a day (TID) | ORAL | Status: DC | PRN

## 2019-02-10 MED ORDER — SODIUM CHLORIDE 0.9 % IV SOLN
1.0000 g | INTRAVENOUS | Status: DC
  Administered 2019-02-11 – 2019-02-12 (×2): 1 g via INTRAVENOUS
  Filled 2019-02-10 (×2): qty 1

## 2019-02-10 MED ORDER — FAMOTIDINE 20 MG PO TABS
20.0000 mg | ORAL_TABLET | Freq: Two times a day (BID) | ORAL | Status: DC
  Administered 2019-02-10 – 2019-02-12 (×5): 20 mg via ORAL
  Filled 2019-02-10 (×6): qty 1

## 2019-02-10 MED ORDER — METOPROLOL SUCCINATE ER 25 MG PO TB24: 25.0000 mg | ORAL_TABLET | Freq: Every day | ORAL | Status: DC

## 2019-02-10 MED ORDER — SODIUM CHLORIDE 0.9 % IV BOLUS (SEPSIS)
1000.0000 mL | Freq: Once | INTRAVENOUS | Status: AC
  Administered 2019-02-10: 1000 mL via INTRAVENOUS

## 2019-02-10 MED ORDER — POLYETHYLENE GLYCOL 3350 17 GM/SCOOP PO POWD
17.0000 g | Freq: Every day | ORAL | Status: DC | PRN
  Filled 2019-02-10: qty 255

## 2019-02-10 MED ORDER — SODIUM CHLORIDE 0.9 % IV SOLN
INTRAVENOUS | Status: DC
  Administered 2019-02-10: 16:00:00 via INTRAVENOUS

## 2019-02-10 MED ORDER — SODIUM CHLORIDE 0.9 % IV SOLN
2.0000 g | Freq: Once | INTRAVENOUS | Status: AC
  Administered 2019-02-10: 2 g via INTRAVENOUS
  Filled 2019-02-10 (×2): qty 2

## 2019-02-10 MED ORDER — METRONIDAZOLE IN NACL 5-0.79 MG/ML-% IV SOLN
500.0000 mg | Freq: Three times a day (TID) | INTRAVENOUS | Status: DC
  Administered 2019-02-10: 500 mg via INTRAVENOUS
  Filled 2019-02-10 (×2): qty 100

## 2019-02-10 MED ORDER — VANCOMYCIN HCL IN DEXTROSE 1-5 GM/200ML-% IV SOLN
1000.0000 mg | Freq: Once | INTRAVENOUS | Status: AC
  Administered 2019-02-10: 1000 mg via INTRAVENOUS
  Filled 2019-02-10: qty 200

## 2019-02-10 MED ORDER — SODIUM CHLORIDE 0.9 % IV BOLUS (SEPSIS)
250.0000 mL | Freq: Once | INTRAVENOUS | Status: AC
  Administered 2019-02-10: 250 mL via INTRAVENOUS

## 2019-02-10 MED ORDER — POTASSIUM CHLORIDE CRYS ER 20 MEQ PO TBCR
40.0000 meq | EXTENDED_RELEASE_TABLET | Freq: Once | ORAL | Status: AC
  Administered 2019-02-10: 40 meq via ORAL
  Filled 2019-02-10: qty 2

## 2019-02-10 MED ORDER — ONDANSETRON HCL 4 MG/2ML IJ SOLN: 4.0000 mg | Freq: Four times a day (QID) | INTRAMUSCULAR | Status: DC | PRN

## 2019-02-10 MED ORDER — VITAMIN D3 25 MCG (1000 UNIT) PO TABS
2000.0000 [IU] | ORAL_TABLET | Freq: Every day | ORAL | Status: DC
  Administered 2019-02-11 – 2019-02-12 (×2): 2000 [IU] via ORAL
  Filled 2019-02-10 (×5): qty 2

## 2019-02-10 MED ORDER — METOPROLOL SUCCINATE ER 25 MG PO TB24
25.0000 mg | ORAL_TABLET | Freq: Every day | ORAL | Status: DC
  Administered 2019-02-11 – 2019-02-12 (×2): 25 mg via ORAL
  Filled 2019-02-10 (×2): qty 1

## 2019-02-10 MED ORDER — ONDANSETRON HCL 4 MG PO TABS: 4.0000 mg | ORAL_TABLET | Freq: Four times a day (QID) | ORAL | Status: DC | PRN

## 2019-02-10 MED ORDER — SODIUM CHLORIDE 0.9 % IV BOLUS (SEPSIS)
500.0000 mL | Freq: Once | INTRAVENOUS | Status: AC
  Administered 2019-02-10: 500 mL via INTRAVENOUS

## 2019-02-10 MED ORDER — LORAZEPAM 0.5 MG PO TABS
0.5000 mg | ORAL_TABLET | Freq: Two times a day (BID) | ORAL | Status: DC | PRN
  Administered 2019-02-10: 0.5 mg via ORAL
  Filled 2019-02-10: qty 1

## 2019-02-10 MED ORDER — CALCIUM CARBONATE-VITAMIN D 500-200 MG-UNIT PO TABS
1.0000 | ORAL_TABLET | Freq: Every day | ORAL | Status: DC
  Administered 2019-02-11 – 2019-02-12 (×2): 1 via ORAL
  Filled 2019-02-10 (×3): qty 1

## 2019-02-10 MED ORDER — ACETAMINOPHEN 500 MG PO TABS
1000.0000 mg | ORAL_TABLET | Freq: Once | ORAL | Status: AC
  Administered 2019-02-10: 1000 mg via ORAL
  Filled 2019-02-10: qty 2

## 2019-02-10 NOTE — Progress Notes (Signed)
[name] is a 85 y.o. female patient admitted from ED awake, alert - oriented  X 4 - no acute distress noted. IV in place, occlusive dsg intact without redness.  Orientation to room, and floor completed with information packet given to patient/family.  Patient declined safety video at this time.  Admission INP armband ID verified with patient/family, and in place.   SR up x 2, fall assessment complete, with patient and family able to verbalize understanding of risk associated with falls, and verbalized understanding to call nsg before up out of bed.  Call light within reach, patient able to voice, and demonstrate understanding.  Skin, clean-dry- intact without evidence of bruising, or skin tears.   No evidence of skin break down noted on exam.     Will cont to eval and treat per MD orders.  Tama High, RN 02/10/2019 3:58 PM

## 2019-02-10 NOTE — ED Provider Notes (Signed)
TIME SEEN: 4:44 AM  CHIEF COMPLAINT: fall, confusion  HPI: Patient is a 85 year old female with history of thoracic aortic aneurysm, nonobstructive CAD, hypertension, hyperlipidemia, TIA who lives at [name] who presents today to the emergency department with unwitnessed fall.  Daughter reports that she was called by staff stating they found her on the floor.  Patient denies this.  Daughter reports patient has been confused today.  She tells her that the facility staff members pulled her out from underneath the bed.  She also told her daughter that the bed was "topsy turvy".  Daughter denies history of dementia.  No known fevers, cough, vomiting or diarrhea.  She is on Plavix for TIAs.  Patient denies any pain.  PCP - [name]  ROS: Level 5 caveat for confusion  PAST MEDICAL HISTORY/PAST SURGICAL HISTORY:  Past Medical History:  Diagnosis Date  . Aortic aneurysm, thoracic ([facility])    4.4 cm  . Aortic dilatation ([name])    Seen on cath 2005  . Arthritis   . CAD (coronary artery disease)    a. Nonobstructive by last cath in 05/2004 with 30% LAD. b. EF 65-70% 12/2010. c. Hx ASA intolerance.   . Colitis    12/2010 associated with transient lower GI bleeding/anemia tx with abx  . GERD (gastroesophageal reflux disease)    a. Minimal hiatal hernia on EGD 2006, no obvious esophageal source for CP at that time.  . Hypercholesteremia    Hx of myalgias felt to be statin related but taking Crestor as OP  . Hypertension   . Osteoporosis   . Stroke [facility])    TIA    MEDICATIONS:  Prior to Admission medications   Medication Sig Start Date End Date Taking? Authorizing Provider  acetaminophen (TYLENOL) 500 MG tablet Take 500 mg by mouth 3 (three) times daily as needed for mild pain.    [provider]  aspirin 81 MG chewable tablet Chew 81 mg by mouth daily.    [provider]  Calcium Carb-Cholecalciferol (CALCIUM-VITAMIN D) 500-400 MG-UNIT TABS Take 1 tablet by  mouth daily.    [provider]  cholecalciferol (VITAMIN D) 1000 UNITS tablet Take 2,000 Units by mouth daily.     [provider]  clopidogrel (PLAVIX) 75 MG tablet Take 75 mg by mouth daily.    [provider]  hydrocortisone (ANUSOL-HC) 2.5 % rectal cream Place 1 application rectally 2 (two) times daily as needed for hemorrhoids or anal itching.    [provider]  LORazepam (ATIVAN) 0.5 MG tablet Take 1 tablet (0.5 mg total) by mouth at bedtime as needed. For sleep. Patient taking differently: Take 0.5 mg by mouth daily.  09/03/15   Karen Russian, MD  metoprolol succinate (TOPROL-XL) 25 MG 24 hr tablet Take 25 mg by mouth daily.    [provider]  Multiple Vitamin (MULTIVITAMIN WITH MINERALS) TABS tablet Take 1 tablet by mouth daily.    [provider]  Pitavastatin Calcium (LIVALO) 1 MG TABS Take 1 mg by mouth daily.    [provider]  Polyethyl Glycol-Propyl Glycol (SYSTANE OP) Apply 1-2 drops to eye 2 (two) times daily.    [provider]  polyethylene glycol powder (GLYCOLAX/MIRALAX) powder Take 17 g by mouth 2 (two) times daily as needed. Patient taking differently: Take 17 g by mouth daily as needed for mild constipation.  03/07/15   Roselee Culver, MD  ranitidine (ZANTAC) 150 MG tablet Take 150 mg by mouth  at bedtime.     [provider]    ALLERGIES:  Allergies  Allergen Reactions  . Aspirin Other (See Comments)    Feels like choking  . Crestor [Rosuvastatin] Other (See Comments)    Lower Extremity Myalgia Lower ext myalgia  . Bactrim Other (See Comments)    Unknown  . Demeclocycline Rash  . Niacin Other (See Comments)    Turns Face Red  . Niacin And Related Other (See Comments)    Turns red in the face  . Penicillins Other (See Comments)    Unknown Reaction Per The Patient Unknown  . Relafen [Nabumetone] Other (See Comments)    Unknown Reaction Per The Patient Unknown  . Sulfa  Antibiotics Other (See Comments)    Unknown Reaction Per The Patient Unknown  . Tetracyclines & Related Rash    SOCIAL HISTORY:  Social History   Tobacco Use  . Smoking status: Former Smoker    Last attempt to quit: 04/26/1959    Years since quitting: 59.8  . Smokeless tobacco: Never Used  Substance Use Topics  . Alcohol use: No    Alcohol/week: 1.0 standard drinks    Types: 1 Glasses of wine per week    FAMILY HISTORY: Family History  Problem Relation Age of Onset  . Heart disease Mother   . Heart failure Mother   . Arthritis Mother   . Heart disease Brother   . Heart disease Other   . Heart failure Other     EXAM: BP (!) 113/56   Pulse 86   Temp (!) 100.9 F (38.3 C) (Rectal)   Resp (!) 23   SpO2 93%  CONSTITUTIONAL: Alert and oriented to person and place but not time or situation.  Elderly.  Smiling and appears in no distress. HEAD: Normocephalic; atraumatic EYES: Conjunctivae clear, PERRL, EOMI ENT: normal nose; no rhinorrhea; moist mucous membranes; pharynx without lesions noted; no dental injury; no septal hematoma NECK: Supple, no meningismus, no LAD; no midline spinal tenderness, step-off or deformity; trachea midline, cervical collar in place CARD: RRR; S1 and S2 appreciated; + systolic murmur, no clicks, no rubs, no gallops RESP: Normal chest excursion without splinting or tachypnea; breath sounds clear and equal bilaterally; no wheezes, no rhonchi, no rales; no hypoxia or respiratory distress CHEST:  chest wall stable, no crepitus or ecchymosis or deformity, nontender to palpation; no flail chest ABD/GI: Normal bowel sounds; non-distended; soft, non-tender, no rebound, no guarding; no ecchymosis or other lesions noted PELVIS:  stable, nontender to palpation BACK:  The back appears normal and is non-tender to palpation, there is no CVA tenderness; no midline spinal tenderness, step-off or deformity EXT: Normal ROM in all joints; non-tender to palpation; no  edema; normal capillary refill; no cyanosis, no bony tenderness or bony deformity of patient's extremities, no joint effusion, compartments are soft, extremities are warm and well-perfused, no ecchymosis SKIN: Normal color for age and race; warm NEURO: Moves all extremities equally, no pronator drift, sensation to light touch intact diffusely, cranial nerves II through XII intact, normal speech PSYCH: The patient's mood and manner are appropriate. Grooming and personal hygiene are appropriate.  MEDICAL DECISION MAKING: Patient here with unwitnessed fall.  She appears to be confused from her baseline per her daughter.  Found to be febrile here with rectal temperature of 100.9.  Intermittently tachypneic as well.  Will start septic work-up and give IV fluids, antibiotics.  Will obtain labs, cultures, chest x-ray, urine, flu swab.  Also obtain CT of  her head and cervical spine given unwitnessed fall with patient on Plavix.  No other sign of trauma on examination.  Other than confusion, patient is neurologically intact.  ED PROGRESS: Patient's labs are reassuring.  No leukocytosis.  Normal lactate.  Chest x-ray clear.  Flu swab negative.  Urine appears infected.  We will send urine culture.  Patient still seems to be intermittently confused.  Daughter reports this is not normal.  Daughter states they live at [facility] but they live in an independent living apartment and she is the primary caregiver for her elderly, demented husband who has had a stroke.  Daughter feels more comfortable with patient being admitted to the hospital due to her delirium.  I feel this is reasonable.  CT head and cervical spine negative.  Will discuss with medicine for admission.  PCP is Dr. Forde Dandy.  7:52 AM Discussed patient's case with hospitalist, Dr. Sloan Leiter.  I have recommended admission and patient (and family if present) agree with this plan. Admitting physician will place admission orders.   I reviewed all nursing notes,  vitals, pertinent previous records, EKGs, lab and urine results, imaging (as available).    EKG Interpretation  Date/Time:  Tuesday February 10 2019 04:13:51 EST Ventricular Rate:  89 PR Interval:    QRS Duration: 78 QT Interval:  413 QTC Calculation: 503 R Axis:   -43 Text Interpretation:  Sinus rhythm Left axis deviation Low voltage, precordial leads Consider anterior infarct Minimal ST depression, lateral leads Prolonged QT interval No significant change since last tracing Confirmed by , Cyril Mourning [phone]) on 02/10/2019 5:04:45 AM           , Delice Bison, DO 02/10/19 [id number]

## 2019-02-10 NOTE — H&P (Signed)
History and Physical    [name] ZHY:[id number] DOB: 1926-07-11 DOA: 02/10/2019  PCP: Reynold Bowen, MD  Patient coming from: ALF  I have personally briefly reviewed patient's old medical records available.   Chief Complaint: found on the floor. "I do not know what is wrong"  HPI: [name] is a 85 y.o. female with medical history significant of thoracic aortic aneurysm, nonobstructive coronary artery disease, hypertension, hyperlipidemia and TIA who lives at an assisted living facility with her husband presents today to the emergency room with unwitnessed fall.  Patient is poor historian.  She says she does not know anything.  She denies any complaints.  Assisted living staff found her on the floor.  Patient does not know why she is on the floor.  But she says that her husband has stroke and aphasia and many times she gets anxious when he starts acting out.  She occasionally takes Ativan at night but only 1 tablet last night.  Assisted living facility staff had to pull her back from underneath the bed. Denies any history of dementia. Denies any fever, chills, nausea, vomiting, recent change in medications.  Denies any trauma.  She did take Ativan last night but denies any other sleep medication use.  Patient occasionally have dysuria and had infections in the past but never have to be in the hospital. ED Course: T max 100.9. blood pressures low normal improved after fluid boluses. Potassium 3.2. renal functions normal.  Urinalysis was grossly infected. Patient was brought with C-spine precautions, CT head and CT cervical spine without obvious findings.  Chest X-ray is normal. A sepsis alert was called in the ER.  Patient received vancomycin Flagyl and cefepime.  Lactic acid is normal.  Blood pressures are stable.  Will treat as UTI without sepsis.   Review of Systems: As per HPI otherwise 10 point review of systems negative.  Patient is currently awake and alert.  She was  reportedly confused in the morning. She denies any problems right now.   Past Medical History:  Diagnosis Date  . Aortic aneurysm, thoracic (HCC)    4.4 cm  . Aortic dilatation ([place])    Seen on cath 2005  . Arthritis   . CAD (coronary artery disease)    a. Nonobstructive by last cath in 05/2004 with 30% LAD. b. EF 65-70% 12/2010. c. Hx ASA intolerance.   . Colitis    12/2010 associated with transient lower GI bleeding/anemia tx with abx  . GERD (gastroesophageal reflux disease)    a. Minimal hiatal hernia on EGD 2006, no obvious esophageal source for CP at that time.  . Hypercholesteremia    Hx of myalgias felt to be statin related but taking Crestor as OP  . Hypertension   . Osteoporosis   . Stroke [facility])    TIA    Past Surgical History:  Procedure Laterality Date  . ABDOMINAL HYSTERECTOMY    . BACK SURGERY     Disc disease  . hysterectomy -- unknown type    . SHOULDER SURGERY    . Shunt for syringomyelia 1987    . TEMPORAL ARTERY BIOPSY / LIGATION     Negative 2005     reports that she quit smoking about 59 years ago. She has never used smokeless tobacco. She reports that she does not drink alcohol or use drugs.  Allergies  Allergen Reactions  . Aspirin Other (See Comments)    Feels like choking  . Crestor [Rosuvastatin] Other (See Comments)  Lower Extremity Myalgia Lower ext myalgia  . Bactrim Other (See Comments)    Unknown  . Demeclocycline Rash  . Niacin Other (See Comments)    Turns Face Red  . Niacin And Related Other (See Comments)    Turns red in the face  . Penicillins Other (See Comments)    Unknown Reaction Per The Patient Unknown  . Relafen [Nabumetone] Other (See Comments)    Unknown Reaction Per The Patient Unknown  . Sulfa Antibiotics Other (See Comments)    Unknown Reaction Per The Patient Unknown  . Tetracyclines & Related Rash    Family History  Problem Relation Age of Onset  . Heart disease Mother   . Heart failure Mother   .  Arthritis Mother   . Heart disease Brother   . Heart disease Other   . Heart failure Other      Prior to Admission medications   Medication Sig Start Date End Date Taking? Authorizing Provider  acetaminophen (TYLENOL) 500 MG tablet Take 500 mg by mouth 3 (three) times daily as needed for mild pain.   Yes [provider]  Calcium Carb-Cholecalciferol (CALCIUM-VITAMIN D) 500-400 MG-UNIT TABS Take 1 tablet by mouth daily.   Yes [provider]  cholecalciferol (VITAMIN D) 1000 UNITS tablet Take 2,000 Units by mouth daily.    Yes [provider]  clopidogrel (PLAVIX) 75 MG tablet Take 75 mg by mouth daily.   Yes [provider]  famotidine (PEPCID) 10 MG tablet Take 20 mg by mouth 2 (two) times daily.   Yes [provider]  hydrocortisone (ANUSOL-HC) 2.5 % rectal cream Place 1 application rectally 2 (two) times daily as needed for hemorrhoids or anal itching.   Yes [provider]  LORazepam (ATIVAN) 0.5 MG tablet Take 1 tablet (0.5 mg total) by mouth at bedtime as needed. For sleep. Patient taking differently: Take 0.5 mg by mouth 2 (two) times daily as needed for anxiety.  09/03/15  Yes Darlyne Russian, MD  metoprolol succinate (TOPROL-XL) 25 MG 24 hr tablet Take 25 mg by mouth daily.   Yes [provider]  Multiple Vitamin (MULTIVITAMIN WITH MINERALS) TABS tablet Take 1 tablet by mouth daily.   Yes [provider]  Polyethyl Glycol-Propyl Glycol (SYSTANE OP) Apply 1-2 drops to eye 2 (two) times daily.   Yes [provider]  polyethylene glycol powder (GLYCOLAX/MIRALAX) powder Take 17 g by mouth 2 (two) times daily as needed. Patient taking differently: Take 17 g by mouth daily as needed for mild constipation.  03/07/15  Yes Roselee Culver, MD  Pitavastatin Calcium (LIVALO) 1 MG TABS Take 1 mg by mouth daily.    [provider]    Physical Exam: Vitals:   02/10/19 0800 02/10/19 0805 02/10/19 0815  02/10/19 0816  BP: (!) 108/39  (!) 92/39 (!) 99/43  Pulse: 86 84 81 82  Resp: (!) 25 18 20 16   Temp:      TempSrc:      SpO2: 93% 94% 90% 91%  Weight:      Height:        Constitutional: NAD, calm, comfortable Vitals:   02/10/19 0800 02/10/19 0805 02/10/19 0815 02/10/19 0816  BP: (!) 108/39  (!) 92/39 (!) 99/43  Pulse: 86 84 81 82  Resp: (!) 25 18 20 16   Temp:      TempSrc:      SpO2: 93% 94% 90% 91%  Weight:      Height:  Eyes: PERRL, lids and conjunctivae normal ENMT: Mucous membranes are moist. Posterior pharynx clear of any exudate or lesions.Normal dentition.  Neck: normal, supple, no masses, no thyromegaly Respiratory: clear to auscultation bilaterally, no wheezing, no crackles. Normal respiratory effort. No accessory muscle use.  Cardiovascular: Regular rate and rhythm, no murmurs / rubs / gallops. No extremity edema. 2+ pedal pulses. No carotid bruits.  Abdomen: no tenderness, no masses palpated. No hepatosplenomegaly. Bowel sounds positive.  Musculoskeletal: no clubbing / cyanosis. No joint deformity upper and lower extremities. Good ROM, no contractures. Normal muscle tone.  Skin: no rashes, lesions, ulcers. No induration Neurologic: CN 2-12 grossly intact. Sensation intact, DTR normal. Strength 5/5 in all 4.  Psychiatric: Normal judgment and insight. Alert and oriented x 3. Normal mood.  Currently patient is alert oriented and not confused.    Labs on Admission: I have personally reviewed following labs and imaging studies  CBC: Recent Labs  Lab 02/10/19 0446  WBC 8.1  NEUTROABS 7.0  HGB 13.2  HCT 40.6  MCV 92.7  PLT 315   Basic Metabolic Panel: Recent Labs  Lab 02/10/19 0446  NA 144  K 3.2*  CL 107  CO2 25  GLUCOSE 125*  BUN 15  CREATININE 0.70  CALCIUM 9.3   GFR: Estimated Creatinine Clearance: 38.7 mL/min (by C-G formula based on SCr of 0.7 mg/dL). Liver Function Tests: Recent Labs  Lab 02/10/19 0446  AST 33  ALT 16  ALKPHOS  36*  BILITOT 1.4*  PROT 6.2*  ALBUMIN 3.5   No results for input(s): LIPASE, AMYLASE in the last 168 hours. No results for input(s): AMMONIA in the last 168 hours. Coagulation Profile: No results for input(s): INR, PROTIME in the last 168 hours. Cardiac Enzymes: No results for input(s): CKTOTAL, CKMB, CKMBINDEX, TROPONINI in the last 168 hours. BNP (last 3 results) No results for input(s): PROBNP in the last 8760 hours. HbA1C: No results for input(s): HGBA1C in the last 72 hours. CBG: No results for input(s): GLUCAP in the last 168 hours. Lipid Profile: No results for input(s): CHOL, HDL, LDLCALC, TRIG, CHOLHDL, LDLDIRECT in the last 72 hours. Thyroid Function Tests: No results for input(s): TSH, T4TOTAL, FREET4, T3FREE, THYROIDAB in the last 72 hours. Anemia Panel: No results for input(s): VITAMINB12, FOLATE, FERRITIN, TIBC, IRON, RETICCTPCT in the last 72 hours. Urine analysis:    Component Value Date/Time   COLORURINE YELLOW 02/10/2019 0656   APPEARANCEUR HAZY (A) 02/10/2019 0656   LABSPEC 1.021 02/10/2019 0656   PHURINE 5.0 02/10/2019 0656   GLUCOSEU NEGATIVE 02/10/2019 0656   HGBUR MODERATE (A) 02/10/2019 0656   BILIRUBINUR NEGATIVE 02/10/2019 0656   BILIRUBINUR neg 03/07/2015 1506   KETONESUR 20 (A) 02/10/2019 0656   PROTEINUR NEGATIVE 02/10/2019 0656   UROBILINOGEN 0.2 03/07/2015 1506   UROBILINOGEN 0.2 07/30/2014 1236   NITRITE POSITIVE (A) 02/10/2019 0656   LEUKOCYTESUR SMALL (A) 02/10/2019 0656    Radiological Exams on Admission: Dg Chest 2 View  Result Date: 02/10/2019 CLINICAL DATA:  85 year old female with history of fever. Confusion. Fall. EXAM: CHEST - 2 VIEW COMPARISON:  Chest x-ray 08/13/2018. FINDINGS: Lung volumes are normal. No consolidative airspace disease. No pleural effusions. No pneumothorax. No pulmonary nodule or mass noted. Severe calcifications of the mitral annulus. Pulmonary vasculature and the cardiomediastinal silhouette are within normal  limits. Aortic atherosclerosis. IMPRESSION: 1. No radiographic evidence of acute cardiopulmonary disease. 2. Aortic atherosclerosis. Electronically Signed   By: Vinnie Langton M.D.   On: 02/10/2019 05:38  Ct Head Wo Contrast  Result Date: 02/10/2019 CLINICAL DATA:  85 y/o  F; unwitnessed fall. EXAM: CT HEAD WITHOUT CONTRAST CT CERVICAL SPINE WITHOUT CONTRAST TECHNIQUE: Multidetector CT imaging of the head and cervical spine was performed following the standard protocol without intravenous contrast. Multiplanar CT image reconstructions of the cervical spine were also generated. COMPARISON:  12/06/2011 CT head.  04/20/2009 cervical MRI. FINDINGS: CT HEAD FINDINGS Brain: No evidence of acute infarction, hemorrhage, hydrocephalus, extra-axial collection or mass lesion/mass effect. Nonspecific white matter hypodensities are compatible chronic microvascular ischemic changes and volume loss of brain. Findings are progressed from 2012. Vascular: Calcific atherosclerosis of the carotid siphons. No hyperdense vessel identified. Skull: Normal. Negative for fracture or focal lesion. Sinuses/Orbits: Mild mucosal thickening of the maxillary sinuses. Additional visible paranasal sinuses and the mastoid air cells are normally aerated. Orbits are unremarkable. Other: None. CT CERVICAL SPINE FINDINGS Alignment: C2-3 and C3-4 grade 1 anterolisthesis. Skull base and vertebrae: No acute fracture. No primary bone lesion or focal pathologic process. Small calcified odontoid pannus is increased in size. There are small erosive changes of the anterior aspect of the odontoid process. Productive changes of the anterior C1-2 articulation (series 8, image 22). Soft tissues and spinal canal: No prevertebral fluid or swelling. No visible canal hematoma. Disc levels: Multilevel discogenic degenerative changes greatest at the C6-7 level with there is moderate loss of intervertebral disc space height. Left-greater-than-right upper cervical  facet arthropathy is greatest at C2-C4. uncovertebral and facet hypertrophy results in left-sided C3-4, bilateral C5-6, and bilateral C6-7 foraminal stenosis. No high-grade spinal canal stenosis. Upper chest: Negative. Other: Negative. IMPRESSION: CT head: 1. No acute intracranial abnormality identified. 2. Progression of chronic microvascular ischemic changes and volume loss of the brain from 2012. CT cervical spine: 1. No acute fracture or dislocation. 2. Moderate cervical spondylosis greatest at the C6-7 level. Electronically Signed   By: Kristine Garbe M.D.   On: 02/10/2019 05:40   Ct Cervical Spine Wo Contrast  Result Date: 02/10/2019 CLINICAL DATA:  85 y/o  F; unwitnessed fall. EXAM: CT HEAD WITHOUT CONTRAST CT CERVICAL SPINE WITHOUT CONTRAST TECHNIQUE: Multidetector CT imaging of the head and cervical spine was performed following the standard protocol without intravenous contrast. Multiplanar CT image reconstructions of the cervical spine were also generated. COMPARISON:  12/06/2011 CT head.  04/20/2009 cervical MRI. FINDINGS: CT HEAD FINDINGS Brain: No evidence of acute infarction, hemorrhage, hydrocephalus, extra-axial collection or mass lesion/mass effect. Nonspecific white matter hypodensities are compatible chronic microvascular ischemic changes and volume loss of brain. Findings are progressed from 2012. Vascular: Calcific atherosclerosis of the carotid siphons. No hyperdense vessel identified. Skull: Normal. Negative for fracture or focal lesion. Sinuses/Orbits: Mild mucosal thickening of the maxillary sinuses. Additional visible paranasal sinuses and the mastoid air cells are normally aerated. Orbits are unremarkable. Other: None. CT CERVICAL SPINE FINDINGS Alignment: C2-3 and C3-4 grade 1 anterolisthesis. Skull base and vertebrae: No acute fracture. No primary bone lesion or focal pathologic process. Small calcified odontoid pannus is increased in size. There are small erosive changes  of the anterior aspect of the odontoid process. Productive changes of the anterior C1-2 articulation (series 8, image 22). Soft tissues and spinal canal: No prevertebral fluid or swelling. No visible canal hematoma. Disc levels: Multilevel discogenic degenerative changes greatest at the C6-7 level with there is moderate loss of intervertebral disc space height. Left-greater-than-right upper cervical facet arthropathy is greatest at C2-C4. uncovertebral and facet hypertrophy results in left-sided C3-4, bilateral C5-6, and bilateral C6-7 foraminal  stenosis. No high-grade spinal canal stenosis. Upper chest: Negative. Other: Negative. IMPRESSION: CT head: 1. No acute intracranial abnormality identified. 2. Progression of chronic microvascular ischemic changes and volume loss of the brain from 2012. CT cervical spine: 1. No acute fracture or dislocation. 2. Moderate cervical spondylosis greatest at the C6-7 level. Electronically Signed   By: Kristine Garbe M.D.   On: 02/10/2019 05:40    EKG: Independently reviewed.  Sinus rhythm.  Left bundle branch block pattern which is chronic.  Present on previous EKGs.  Assessment/Plan Principal Problem:   Acute lower UTI Active Problems:   Hypercholesteremia   GERD (gastroesophageal reflux disease)   CAD (coronary artery disease)   Acute metabolic encephalopathy   Encephalopathy acute     1.  Acute lower UTI: Present on admission.  Patient currently hemodynamically stable. Agree with admission given severity of symptoms. Patient was given vancomycin and Flagyl and cefepime in the ER.  Blood cultures and urine cultures were sent.  We will continue cefepime only.  Keep on maintenance IV fluids.  2.  Altered mental status/acute metabolic encephalopathy: Probably related to UTI.  Nonfocal exam.  CT head is normal.  Ambulate and work with PT OT. She can probably go back to assisted living facility once clinically improved.  3.  GERD: On PPI.   Continue.  4.  Coronary artery disease: Patient is without evidence of acute coronary syndrome.  She is on Plavix that she will continue.  No evidence of trauma or bleeding.  Discussed with patient's daughter at the bedside.  Patient is DNR/DNI.  She is currently at assisted living facility.  She will work with PT OT and will decide if she needs any therapies at assisted living place.  DVT prophylaxis: Lovenox. Code Status: DNR/DNI. Family Communication: Daughter at the bedside. Disposition Plan: Assisted living place Consults called: None. Admission status: Inpatient.   Barb Merino MD [facility] Pager [phone]  If 7PM-7AM, please contact night-coverage [email or web address] Password [facility]  02/10/2019, 8:41 AM

## 2019-02-10 NOTE — ED Triage Notes (Signed)
BIB [facility] from [facility] with c/o of unwitnessed fall. Pt states: "I'm not sure how I landed in the floor but I am certain I did not fall". Pt is on Plavix for TIAs. C-collar in placed on arrival.

## 2019-02-11 DIAGNOSIS — G9341 Metabolic encephalopathy: Secondary | ICD-10-CM

## 2019-02-11 DIAGNOSIS — I2510 Atherosclerotic heart disease of native coronary artery without angina pectoris: Secondary | ICD-10-CM

## 2019-02-11 LAB — BASIC METABOLIC PANEL
Anion gap: 8 (ref 5–15)
BUN: 12 mg/dL (ref 8–23)
CO2: 21 mmol/L — ABNORMAL LOW (ref 22–32)
CREATININE: 0.64 mg/dL (ref 0.44–1.00)
Calcium: 8.1 mg/dL — ABNORMAL LOW (ref 8.9–10.3)
Chloride: 111 mmol/L (ref 98–111)
GFR calc Af Amer: >60 mL/min (ref >60)
GFR calc non Af Amer: >60 mL/min (ref >60)
GLUCOSE: 108 mg/dL — AB (ref 70–99)
Potassium: 3.2 mmol/L — ABNORMAL LOW (ref 3.5–5.1)
Sodium: 140 mmol/L (ref 135–145)

## 2019-02-11 LAB — CBC
HCT: 35.3 % — ABNORMAL LOW (ref 36.0–46.0)
Hemoglobin: 11.3 g/dL — ABNORMAL LOW (ref 12.0–15.0)
MCH: 29.8 pg (ref 26.0–34.0)
MCHC: 32 g/dL (ref 30.0–36.0)
MCV: 93.1 fL (ref 80.0–100.0)
Platelets: 163 10*3/uL (ref 150–400)
RBC: 3.79 MIL/uL — ABNORMAL LOW (ref 3.87–5.11)
RDW: 13.2 % (ref 11.5–15.5)
WBC: 6.1 10*3/uL (ref 4.0–10.5)
nRBC: 0 % (ref 0.0–0.2)

## 2019-02-11 MED ORDER — POTASSIUM CHLORIDE CRYS ER 20 MEQ PO TBCR
40.0000 meq | EXTENDED_RELEASE_TABLET | ORAL | Status: AC
  Administered 2019-02-11 (×2): 40 meq via ORAL
  Filled 2019-02-11 (×2): qty 2

## 2019-02-11 NOTE — Progress Notes (Signed)
Patient ID: SKYLEY GRANDMAISON, female   DOB: Nov 01, 1926, 85 y.o.   MRN: [id number]  PROGRESS NOTE    LAURAMAE KNEISLEY  JSE:[id number] DOB: July 03, 1926 DOA: 02/10/2019 PCP: Reynold Bowen, MD   Brief Narrative:  85 year old female with history of thoracic aortic aneurysm, nonobstructive coronary artery disease, hypertension, hyperlipidemia and TIA presented with altered mental status and was admitted for fever and UTI and started on antibiotics.  Initial CT head was negative for acute abnormality.   Assessment & Plan:   Principal Problem:   Acute lower UTI Active Problems:   Hypercholesteremia   GERD (gastroesophageal reflux disease)   CAD (coronary artery disease)   Acute metabolic encephalopathy   Encephalopathy acute   UTI: Present on admission -Follow cultures.  Continue parenteral antibiotics.  Follow cultures.  Acute metabolic encephalopathy -Probably from UTI.  CT head on admission was negative for acute abnormality.  Mental status much better.  Follow PT recommendations.  Fall precautions  Hypokalemia -Replace.  Repeat a.m. labs  GERD  -continue PPI  Coronary artery disease -Stable.  Continue Plavix  DVT prophylaxis: Lovenox Code Status: DNR Family Communication: None at bedside Disposition Plan: Probably back to assisted living facility in 1 to 2 days if clinically improved  Consultants: None  Procedures: None  Antimicrobials:  Anti-infectives (From admission, onward)   Start     Dose/Rate Route Frequency Ordered Stop   02/11/19 0600  ceFEPIme (MAXIPIME) 1 g in sodium chloride 0.9 % 100 mL IVPB     1 g 200 mL/hr over 30 Minutes Intravenous Every 24 hours 02/10/19 0947     02/10/19 0500  ceFEPIme (MAXIPIME) 2 g in sodium chloride 0.9 % 100 mL IVPB     2 g 200 mL/hr over 30 Minutes Intravenous  Once 02/10/19 0447 02/10/19 0634   02/10/19 0500  metroNIDAZOLE (FLAGYL) IVPB 500 mg  Status:  Discontinued     500 mg 100 mL/hr over 60 Minutes Intravenous  Every 8 hours 02/10/19 0447 02/10/19 1024   02/10/19 0500  vancomycin (VANCOCIN) IVPB 1000 mg/200 mL premix     1,000 mg 200 mL/hr over 60 Minutes Intravenous  Once 02/10/19 0447 02/10/19 0809         Subjective: Patient seen and examined at bedside.  She is sitting on chair and feels much better.  No overnight fever or vomiting reported.  Objective: Vitals:   02/11/19 0840 02/11/19 0845 02/11/19 0848 02/11/19 0900  BP: 119/60 133/62 127/61 127/64  Pulse:    84  Resp:      Temp:      TempSrc:      SpO2:      Weight:      Height:        Intake/Output Summary (Last 24 hours) at 02/11/2019 1337 Last data filed at 02/11/2019 0900 Gross per 24 hour  Intake 240 ml  Output -  Net 240 ml   Filed Weights   02/10/19 0439 02/10/19 0500  Weight: 58.1 kg 59.4 kg    Examination:  General exam: Appears calm and comfortable.  More awake and answers questions.  No distress Respiratory system: Bilateral decreased breath sounds at bases Cardiovascular system: S1 & S2 heard, Rate controlled Gastrointestinal system: Abdomen is nondistended, soft and nontender. Normal bowel sounds heard. Extremities: No cyanosis, clubbing, edema  Central nervous system: Alert and oriented. No focal neurological deficits. Moving extremities    Data Reviewed: I have personally reviewed following labs and imaging studies  CBC: Recent Labs  Lab  02/10/19 0446 02/11/19 0348  WBC 8.1 6.1  NEUTROABS 7.0  --   HGB 13.2 11.3*  HCT 40.6 35.3*  MCV 92.7 93.1  PLT 179 097   Basic Metabolic Panel: Recent Labs  Lab 02/10/19 0446 02/11/19 0348  NA 144 140  K 3.2* 3.2*  CL 107 111  CO2 25 21*  GLUCOSE 125* 108*  BUN 15 12  CREATININE 0.70 0.64  CALCIUM 9.3 8.1*   GFR: Estimated Creatinine Clearance: 38.7 mL/min (by C-G formula based on SCr of 0.64 mg/dL). Liver Function Tests: Recent Labs  Lab 02/10/19 0446  AST 33  ALT 16  ALKPHOS 36*  BILITOT 1.4*  PROT 6.2*  ALBUMIN 3.5   No results  for input(s): LIPASE, AMYLASE in the last 168 hours. No results for input(s): AMMONIA in the last 168 hours. Coagulation Profile: No results for input(s): INR, PROTIME in the last 168 hours. Cardiac Enzymes: No results for input(s): CKTOTAL, CKMB, CKMBINDEX, TROPONINI in the last 168 hours. BNP (last 3 results) No results for input(s): PROBNP in the last 8760 hours. HbA1C: No results for input(s): HGBA1C in the last 72 hours. CBG: No results for input(s): GLUCAP in the last 168 hours. Lipid Profile: No results for input(s): CHOL, HDL, LDLCALC, TRIG, CHOLHDL, LDLDIRECT in the last 72 hours. Thyroid Function Tests: No results for input(s): TSH, T4TOTAL, FREET4, T3FREE, THYROIDAB in the last 72 hours. Anemia Panel: No results for input(s): VITAMINB12, FOLATE, FERRITIN, TIBC, IRON, RETICCTPCT in the last 72 hours. Sepsis Labs: Recent Labs  Lab 02/10/19 0456  LATICACIDVEN 1.8    Recent Results (from the past 240 hour(s))  Blood Culture (routine x 2)     Status: None (Preliminary result)   Collection Time: 02/10/19  4:56 AM  Result Value Ref Range Status   Specimen Description BLOOD RIGHT ANTECUBITAL  Final   Special Requests   Final    BOTTLES DRAWN AEROBIC AND ANAEROBIC Blood Culture adequate volume   Culture   Final    NO GROWTH 1 DAY Performed at [facility], [address] [place], [place] [address]    Report Status PENDING  Incomplete  Blood Culture (routine x 2)     Status: None (Preliminary result)   Collection Time: 02/10/19  5:06 AM  Result Value Ref Range Status   Specimen Description BLOOD LEFT ANTECUBITAL  Final   Special Requests   Final    BOTTLES DRAWN AEROBIC AND ANAEROBIC Blood Culture adequate volume   Culture   Final    NO GROWTH 1 DAY Performed at [facility], [place] [address] [place], [place] [address]    Report Status PENDING  Incomplete  Urine culture     Status: Abnormal (Preliminary result)   Collection Time: 02/10/19  6:56 AM    Result Value Ref Range Status   Specimen Description URINE, CATHETERIZED  Final   Special Requests   Final    NONE Performed at [facility], [place] [address], [place], [place] [address]    Culture 80,000 COLONIES/mL GRAM NEGATIVE RODS (A)  Final   Report Status PENDING  Incomplete  MRSA PCR Screening     Status: None   Collection Time: 02/10/19  4:09 PM  Result Value Ref Range Status   MRSA by PCR NEGATIVE NEGATIVE Final    Comment:        The GeneXpert MRSA Assay (FDA approved for NASAL specimens only), is one component of a comprehensive MRSA colonization surveillance program.  It is not intended to diagnose MRSA infection nor to guide or monitor treatment for MRSA infections. Performed at [facility], [place] [address], [place], [place] [address]          Radiology Studies: Dg Chest 2 View  Result Date: 02/10/2019 CLINICAL DATA:  85 year old female with history of fever. Confusion. Fall. EXAM: CHEST - 2 VIEW COMPARISON:  Chest x-ray 08/13/2018. FINDINGS: Lung volumes are normal. No consolidative airspace disease. No pleural effusions. No pneumothorax. No pulmonary nodule or mass noted. Severe calcifications of the mitral annulus. Pulmonary vasculature and the cardiomediastinal silhouette are within normal limits. Aortic atherosclerosis. IMPRESSION: 1. No radiographic evidence of acute cardiopulmonary disease. 2. Aortic atherosclerosis. Electronically Signed   By: Vinnie Langton M.D.   On: 02/10/2019 05:38   Ct Head Wo Contrast  Result Date: 02/10/2019 CLINICAL DATA:  85 y/o  F; unwitnessed fall. EXAM: CT HEAD WITHOUT CONTRAST CT CERVICAL SPINE WITHOUT CONTRAST TECHNIQUE: Multidetector CT imaging of the head and cervical spine was performed following the standard protocol without intravenous contrast. Multiplanar CT image reconstructions of the cervical spine were also generated. COMPARISON:  12/06/2011 CT head.  04/20/2009 cervical MRI. FINDINGS: CT HEAD FINDINGS  Brain: No evidence of acute infarction, hemorrhage, hydrocephalus, extra-axial collection or mass lesion/mass effect. Nonspecific white matter hypodensities are compatible chronic microvascular ischemic changes and volume loss of brain. Findings are progressed from 2012. Vascular: Calcific atherosclerosis of the carotid siphons. No hyperdense vessel identified. Skull: Normal. Negative for fracture or focal lesion. Sinuses/Orbits: Mild mucosal thickening of the maxillary sinuses. Additional visible paranasal sinuses and the mastoid air cells are normally aerated. Orbits are unremarkable. Other: None. CT CERVICAL SPINE FINDINGS Alignment: C2-3 and C3-4 grade 1 anterolisthesis. Skull base and vertebrae: No acute fracture. No primary bone lesion or focal pathologic process. Small calcified odontoid pannus is increased in size. There are small erosive changes of the anterior aspect of the odontoid process. Productive changes of the anterior C1-2 articulation (series 8, image 22). Soft tissues and spinal canal: No prevertebral fluid or swelling. No visible canal hematoma. Disc levels: Multilevel discogenic degenerative changes greatest at the C6-7 level with there is moderate loss of intervertebral disc space height. Left-greater-than-right upper cervical facet arthropathy is greatest at C2-C4. uncovertebral and facet hypertrophy results in left-sided C3-4, bilateral C5-6, and bilateral C6-7 foraminal stenosis. No high-grade spinal canal stenosis. Upper chest: Negative. Other: Negative. IMPRESSION: CT head: 1. No acute intracranial abnormality identified. 2. Progression of chronic microvascular ischemic changes and volume loss of the brain from 2012. CT cervical spine: 1. No acute fracture or dislocation. 2. Moderate cervical spondylosis greatest at the C6-7 level. Electronically Signed   By: Kristine Garbe M.D.   On: 02/10/2019 05:40   Ct Cervical Spine Wo Contrast  Result Date: 02/10/2019 CLINICAL DATA:   85 y/o  F; unwitnessed fall. EXAM: CT HEAD WITHOUT CONTRAST CT CERVICAL SPINE WITHOUT CONTRAST TECHNIQUE: Multidetector CT imaging of the head and cervical spine was performed following the standard protocol without intravenous contrast. Multiplanar CT image reconstructions of the cervical spine were also generated. COMPARISON:  12/06/2011 CT head.  04/20/2009 cervical MRI. FINDINGS: CT HEAD FINDINGS Brain: No evidence of acute infarction, hemorrhage, hydrocephalus, extra-axial collection or mass lesion/mass effect. Nonspecific white matter hypodensities are compatible chronic microvascular ischemic changes and volume loss of brain. Findings are progressed from 2012. Vascular: Calcific atherosclerosis of the carotid siphons. No hyperdense vessel identified. Skull: Normal. Negative for fracture or focal lesion. Sinuses/Orbits: Mild mucosal thickening of  the maxillary sinuses. Additional visible paranasal sinuses and the mastoid air cells are normally aerated. Orbits are unremarkable. Other: None. CT CERVICAL SPINE FINDINGS Alignment: C2-3 and C3-4 grade 1 anterolisthesis. Skull base and vertebrae: No acute fracture. No primary bone lesion or focal pathologic process. Small calcified odontoid pannus is increased in size. There are small erosive changes of the anterior aspect of the odontoid process. Productive changes of the anterior C1-2 articulation (series 8, image 22). Soft tissues and spinal canal: No prevertebral fluid or swelling. No visible canal hematoma. Disc levels: Multilevel discogenic degenerative changes greatest at the C6-7 level with there is moderate loss of intervertebral disc space height. Left-greater-than-right upper cervical facet arthropathy is greatest at C2-C4. uncovertebral and facet hypertrophy results in left-sided C3-4, bilateral C5-6, and bilateral C6-7 foraminal stenosis. No high-grade spinal canal stenosis. Upper chest: Negative. Other: Negative. IMPRESSION: CT head: 1. No acute  intracranial abnormality identified. 2. Progression of chronic microvascular ischemic changes and volume loss of the brain from 2012. CT cervical spine: 1. No acute fracture or dislocation. 2. Moderate cervical spondylosis greatest at the C6-7 level. Electronically Signed   By: Kristine Garbe M.D.   On: 02/10/2019 05:40        Scheduled Meds: . calcium-vitamin D  1 tablet Oral Daily  . cholecalciferol  2,000 Units Oral Daily  . clopidogrel  75 mg Oral Daily  . enoxaparin (LOVENOX) injection  40 mg Subcutaneous Q24H  . famotidine  20 mg Oral BID  . metoprolol succinate  25 mg Oral Daily  . potassium chloride  40 mEq Oral Q4H   Continuous Infusions: . sodium chloride 100 mL/hr at 02/10/19 1608  . ceFEPime (MAXIPIME) IV 1 g (02/11/19 0528)     LOS: 1 day        Aline August, MD [facility] Pager [phone]  If 7PM-7AM, please contact night-coverage [email or web address] Password [email or web address] 02/11/2019, 1:37 PM

## 2019-02-11 NOTE — Evaluation (Signed)
Physical Therapy Evaluation Patient Details Name: [name] MRN: [id number] DOB: [birth year] Today's Date: 02/11/2019   History of Present Illness  85 yo female s/p fall with (+) UTI. PMH significant for significant for aortic aneurysm, thoracic aortic dilation, CAD, HTN, osteoporosis, CVA.  Clinical Impression  Pt admitted with above diagnosis. Pt currently with functional limitations due to the deficits listed below (see PT Problem List). At the time of PT eval pt was able to perform transfers and ambulation with gross min assist for balance support and safety with the SPC (Hurry-cane). Would like to trial pt with a RW as she is at a high risk for falls with cane use. Overall, strength and tolerance for functional activity are low at this time and feel this patient will benefit from HHPT to follow up. Acutely pt will benefit from skilled PT to increase their independence and safety with mobility to allow discharge to the venue listed below.       Follow Up Recommendations Home health PT;Supervision/Assistance - 24 hour    Equipment Recommendations  Rolling walker with 5" wheels    Recommendations for Other Services       Precautions / Restrictions Precautions Precautions: Fall Restrictions Weight Bearing Restrictions: No      Mobility  Bed Mobility               General bed mobility comments: Pt received exiting the bathroom  Transfers Overall transfer level: Needs assistance Equipment used: Straight cane Transfers: Sit to/from Stand Sit to Stand: Min assist         General transfer comment: Min assist for balance support with SPC.   Ambulation/Gait Ambulation/Gait assistance: Min assist Gait Distance (Feet): 120 Feet Assistive device: Straight cane Gait Pattern/deviations: Step-through pattern;Decreased stride length;Trunk flexed Gait velocity: Decreased Gait velocity interpretation: <1.8 ft/sec, indicate of risk for recurrent falls General Gait  Details: VC's for sequencing and general safety. One LOB in which heavy min assist was required to recover. Pt with overall poor sequencing with the [facility] and is causing more of a fall risk.   Stairs            Wheelchair Mobility    Modified Rankin (Stroke Patients Only)       Balance Overall balance assessment: Needs assistance Sitting-balance support: Feet supported;No upper extremity supported Sitting balance-Leahy Scale: Fair     Standing balance support: No upper extremity supported Standing balance-Leahy Scale: Poor Standing balance comment: Assist required to recover from LOB                             Pertinent Vitals/Pain Pain Assessment: No/denies pain    Home Living Family/patient expects to be discharged to:: Assisted living               Home Equipment: Cane - single point(hurricane cane- states it does not stand up like its suppose)      Prior Function Level of Independence: Needs assistance      ADL's / Homemaking Assistance Needed: reports that she does nothing but lite dusting sometimes and goes to dining hall for all meals  Comments: reports using a cane "sometimes" she states "i call it my mile cane. i only use it when i have to walk a mile to the dining hall"     Hand Dominance   Dominant Hand: Right    Extremity/Trunk Assessment   Upper Extremity Assessment Upper Extremity Assessment: Defer to OT  evaluation         Cervical / Trunk Assessment Cervical / Trunk Assessment: Kyphotic  Communication   Communication: No difficulties  Cognition Arousal/Alertness: Awake/alert Behavior During Therapy: WFL for tasks assessed/performed Overall Cognitive Status: Impaired/Different from baseline Area of Impairment: Awareness                           Awareness: Emergent   General Comments: pt able to report location and city. pt states "they say i fell. i dont remember falling but i can't prove i didnt'"       General Comments      Exercises     Assessment/Plan    PT Assessment Patient needs continued PT services  PT Problem List Decreased strength;Decreased activity tolerance;Decreased balance;Decreased mobility;Decreased knowledge of use of DME;Decreased safety awareness;Decreased knowledge of precautions;Cardiopulmonary status limiting activity       PT Treatment Interventions DME instruction;Stair training;Functional mobility training;Therapeutic activities;Therapeutic exercise;Neuromuscular re-education;Patient/family education    PT Goals (Current goals can be found in the Care Plan section)  Acute Rehab PT Goals Patient Stated Goal: to return to exercise class PT Goal Formulation: With patient Time For Goal Achievement: 02/18/19 Potential to Achieve Goals: Good    Frequency Min 3X/week   Barriers to discharge Decreased caregiver support Husband is present however from chart review does not sound like he is able to provide reliable assistance due to his own health concerns.    Co-evaluation               AM-PAC PT "6 Clicks" Mobility  Outcome Measure Help needed turning from your back to your side while in a flat bed without using bedrails?: A Little Help needed moving from lying on your back to sitting on the side of a flat bed without using bedrails?: A Little Help needed moving to and from a bed to a chair (including a wheelchair)?: A Little Help needed standing up from a chair using your arms (e.g., wheelchair or bedside chair)?: A Little Help needed to walk in hospital room?: A Little Help needed climbing 3-5 steps with a railing? : A Lot 6 Click Score: 17    End of Session Equipment Utilized During Treatment: Gait belt Activity Tolerance: Patient limited by fatigue Patient left: in chair;with call bell/phone within reach;with chair alarm set Nurse Communication: Mobility status PT Visit Diagnosis: Unsteadiness on feet (R26.81);Muscle weakness (generalized)  (M62.81)    Time: 1700-1749 PT Time Calculation (min) (ACUTE ONLY): 16 min   Charges:   PT Evaluation $PT Eval Moderate Complexity: 1 Mod          Rolinda Roan, PT, DPT Acute Rehabilitation Services Pager: [phone] Office: [phone]   Thelma Comp 02/11/2019, 2:22 PM

## 2019-02-11 NOTE — Progress Notes (Signed)
CSW contacted [facility] to confirm that patient will be able to return.   Percell Locus Dakiyah Heinke LCSW [phone]

## 2019-02-11 NOTE — Evaluation (Signed)
Occupational Therapy Evaluation Patient Details Name: [name] MRN: [id number] DOB: 10/31/1926 Today's Date: 02/11/2019    History of Present Illness 85 yo female s/p fall with (+) UTI PMH:  Aortic aneurysm, thoracic Aortic dilatation, Arthritis, CAD, Colitis, GERD, Hypercholesteremia, Hypertension, Osteoporosis, and Stroke   Clinical Impression   PT admitted with s/p fall and UTI. Pt currently with functional limitiations due to the deficits listed below (see OT problem list). Pt currently Min (A) for basic transfers and higher executive function deficits noted. OT to continue to follow acutely to further assess cognition and balance.  Pt will benefit from skilled OT to increase their independence and safety with adls and balance to allow discharge [place].Pt reports dizziness in exercise last last week and required sitting due to the "spell". Pt also reports spouse had a recent fall and not going to the dining hall due to injury to his face. Spouse will not be a reliable (A) upon d/c. Question if facility can provide some respite care initially upon d/c. Pt reports "I am only there because he needs it and he wouldn't go without me. I am there to help him"    Follow Up Recommendations  Home health OT    Equipment Recommendations  None recommended by OT    Recommendations for Other Services       Precautions / Restrictions Precautions Precautions: Fall      Mobility Bed Mobility Overal bed mobility: Needs Assistance Bed Mobility: Rolling;Supine to Sit Rolling: Supervision   Supine to sit: Min assist     General bed mobility comments: pt able to reach and use rail to roll onto side. pt requires (A) to elevate trunk from surface  Transfers Overall transfer level: Needs assistance   Transfers: Sit to/from Stand Sit to Stand: Min assist         General transfer comment: pt requires min (A) for steady. pt reports "let me do it with posterior shift with standing"     Balance                                           ADL either performed or assessed with clinical judgement   ADL Overall ADL's : Needs assistance/impaired Eating/Feeding: Modified independent Eating/Feeding Details (indicate cue type and reason): pt lying flat on arrival eating fruit. Pt positioned in chair to finish meal and drink coffee. pt reports "ill drink as much of this as they will let me have"  Grooming: Wash/dry hands;Supervision/safety Grooming Details (indicate cue type and reason): requires sinlge UE for support                 Toilet Transfer: Min guard;Regular Toilet;Grab bars [employer] technical [employer] Details (indicate cue type and reason): pt requires incr time and reaching for toilet to control descend Toileting- Clothing Manipulation and Hygiene: Minimal assistance;Sitting/lateral lean       Functional mobility during ADLs: Min guard;Cane General ADL Comments: pt needs cues to not leave the cane in the bathroom. pt reports wearing depends only at night and this is new for her. pt reports i started wearing them only at night a few months ago     Vision Baseline Vision/History: Wears glasses Wears Glasses: At all times       Perception     Praxis      Pertinent Vitals/Pain Pain Assessment: No/denies pain     Hand  Dominance Right   Extremity/Trunk Assessment Upper Extremity Assessment Upper Extremity Assessment: Overall WFL for tasks assessed       Cervical / Trunk Assessment Cervical / Trunk Assessment: Kyphotic   Communication Communication Communication: No difficulties   Cognition Arousal/Alertness: Awake/alert Behavior During Therapy: WFL for tasks assessed/performed Overall Cognitive Status: Impaired/Different from baseline Area of Impairment: Awareness                           Awareness: Emergent   General Comments: pt able to report location and city. pt states "they say i fell. i dont remember falling but i  can't prove i didnt'"   General Comments       Exercises     Shoulder Instructions      Home Living Family/patient expects to be discharged to:: Assisted living                             Home Equipment: Cane - single point(hurricane cane- states it does not stand up like its suppose)          Prior Functioning/Environment Level of Independence: Needs assistance    ADL's / Homemaking Assistance Needed: reports that she does nothing but lite dusting sometimes and goes to dining hall for all meals   Comments: reports using a cane "sometimes" she states "i call it my mile cane. i only use it when i have to walk a mile to the dining hall"        OT Problem List: Decreased strength;Decreased activity tolerance;Impaired balance (sitting and/or standing);Decreased cognition;Decreased safety awareness;Decreased knowledge of use of DME or AE;Decreased knowledge of precautions;Cardiopulmonary status limiting activity      OT Treatment/Interventions: Self-care/ADL training;Therapeutic exercise;Neuromuscular education;Energy conservation;DME and/or AE instruction;Manual therapy;Therapeutic activities;Cognitive remediation/compensation;Patient/family education;Balance training    OT Goals(Current goals can be found in the care plan section) Acute Rehab OT Goals Patient Stated Goal: to return to exercise class OT Goal Formulation: With patient Time For Goal Achievement: 02/25/19 Potential to Achieve Goals: Good  OT Frequency: Min 2X/week   Barriers to D/C:            Co-evaluation              AM-PAC OT "6 Clicks" Daily Activity     Outcome Measure Help from another person eating meals?: None Help from another person taking care of personal grooming?: A Little Help from another person toileting, which includes using toliet, bedpan, or urinal?: A Little Help from another person bathing (including washing, rinsing, drying)?: A Little Help from another person to  put on and taking off regular upper body clothing?: A Little Help from another person to put on and taking off regular lower body clothing?: A Little 6 Click Score: 19   End of Session Equipment Utilized During Treatment: Gait belt Nurse Communication: Mobility status;Precautions  Activity Tolerance: Patient tolerated treatment well Patient left: in chair;with call bell/phone within reach;with chair alarm set  OT Visit Diagnosis: Unsteadiness on feet (R26.81);Repeated falls (R29.6)                Time: 6195-0932 OT Time Calculation (min): 22 min Charges:  OT General Charges $OT Visit: 1 Visit OT Evaluation $OT Eval Moderate Complexity: 1 Mod   Jeri Modena, OTR/L  Acute Rehabilitation Services Pager: [phone] Office: [phone] .   Jeri Modena 02/11/2019, 9:13 AM

## 2019-02-12 LAB — BASIC METABOLIC PANEL
ANION GAP: 9 (ref 5–15)
BUN: 9 mg/dL (ref 8–23)
CO2: 23 mmol/L (ref 22–32)
Calcium: 8.6 mg/dL — ABNORMAL LOW (ref 8.9–10.3)
Chloride: 111 mmol/L (ref 98–111)
Creatinine, Ser: 0.54 mg/dL (ref 0.44–1.00)
GFR calc Af Amer: >60 mL/min (ref >60)
GFR calc non Af Amer: >60 mL/min (ref >60)
Glucose, Bld: 101 mg/dL — ABNORMAL HIGH (ref 70–99)
Potassium: 3.8 mmol/L (ref 3.5–5.1)
Sodium: 143 mmol/L (ref 135–145)

## 2019-02-12 LAB — URINE CULTURE: Culture: 80000 — AB

## 2019-02-12 LAB — MAGNESIUM: Magnesium: 1.9 mg/dL (ref 1.7–2.4)

## 2019-02-12 MED ORDER — POLYETHYLENE GLYCOL 3350 17 GM/SCOOP PO POWD: 17.0000 g | Freq: Every day | ORAL | Status: DC | PRN

## 2019-02-12 MED ORDER — CEPHALEXIN 500 MG PO CAPS: 500.0000 mg | ORAL_CAPSULE | Freq: Three times a day (TID) | ORAL | 0 refills | Status: AC

## 2019-02-12 MED ORDER — LORAZEPAM 0.5 MG PO TABS: 0.5000 mg | ORAL_TABLET | Freq: Two times a day (BID) | ORAL | 0 refills | Status: DC | PRN

## 2019-02-12 NOTE — Discharge Summary (Signed)
Physician Discharge Summary  CECILIA VANCLEVE JIR:[id number] DOB: [birth year] DOA: 02/10/2019  PCP: Reynold Bowen, MD  Admit date: 02/10/2019 Discharge date: 02/12/2019  Admitted From: ALF Disposition:  ALF  Recommendations for Outpatient Follow-up:  1. Follow up with PCP in 1 week 2. Follow-up in the ED if symptoms worsen or new.   Home Health: Home health PT/OT Equipment/Devices: None  Discharge Condition: Stable CODE STATUS: DNR Diet recommendation: Heart Healthy   Brief/Interim Summary: 85 year old female with history of thoracic aortic aneurysm, nonobstructive coronary artery disease, hypertension, hyperlipidemia and TIA presented with altered mental status and was admitted for fever and UTI and started on antibiotics.  Initial CT head was negative for acute abnormality.  Her mental status improved during the hospitalization.  Urine culture grew E. coli.  She will be discharged on oral antibiotics.  Discharge Diagnoses:  Principal Problem:   Acute lower UTI Active Problems:   Hypercholesteremia   GERD (gastroesophageal reflux disease)   CAD (coronary artery disease)   Acute metabolic encephalopathy   Encephalopathy acute  E. coli UTI: Present on admission -Urine culture is growing E. coli.  She was treated with IV antibiotics.  Hemodynamically stable.  Not spiking temperatures.  Discharge on oral Keflex for 4 more days.  Acute metabolic encephalopathy -Probably from UTI.  CT head on admission was negative for acute abnormality.   -Mental status much improved.  Outpatient follow-up -PT/OT recommend home health PT/OT  Hypokalemia -Replaced.    Improved.  GERD  -continue famotidine  Coronary artery disease -Stable.  Continue Plavix  Discharge Instructions  Discharge Instructions    Call MD for:  difficulty breathing, headache or visual disturbances   Complete by:  As directed    Call MD for:  extreme fatigue   Complete by:  As directed    Call MD for:   hives   Complete by:  As directed    Call MD for:  persistant dizziness or light-headedness   Complete by:  As directed    Call MD for:  persistant nausea and vomiting   Complete by:  As directed    Call MD for:  severe uncontrolled pain   Complete by:  As directed    Call MD for:  temperature >100.4   Complete by:  As directed    Diet - low sodium heart healthy   Complete by:  As directed    Increase activity slowly   Complete by:  As directed      Allergies as of 02/12/2019      Reactions   Aspirin Other (See Comments)   Feels like choking   Crestor [rosuvastatin] Other (See Comments)   Lower Extremity Myalgia Lower ext myalgia   Bactrim Other (See Comments)   Unknown   Demeclocycline Rash   Niacin Other (See Comments)   Turns Face Red   Niacin And Related Other (See Comments)   Turns red in the face   Penicillins Other (See Comments)   Unknown Reaction Per The Patient Unknown   Relafen [nabumetone] Other (See Comments)   Unknown Reaction Per The Patient Unknown   Sulfa Antibiotics Other (See Comments)   Unknown Reaction Per The Patient Unknown   Tetracyclines & Related Rash      Medication List    TAKE these medications   acetaminophen 500 MG tablet Commonly known as:  TYLENOL Take 500 mg by mouth 3 (three) times daily as needed for mild pain.   Calcium-Vitamin D 500-400 MG-UNIT Tabs Take 1 tablet  by mouth daily.   cephALEXin 500 MG capsule Commonly known as:  KEFLEX Take 1 capsule (500 mg total) by mouth 3 (three) times daily for 4 days.   cholecalciferol 1000 units tablet Commonly known as:  VITAMIN D Take 2,000 Units by mouth daily.   clopidogrel 75 MG tablet Commonly known as:  PLAVIX Take 75 mg by mouth daily.   famotidine 10 MG tablet Commonly known as:  PEPCID Take 20 mg by mouth 2 (two) times daily.   hydrocortisone 2.5 % rectal cream Commonly known as:  ANUSOL-HC Place 1 application rectally 2 (two) times daily as needed for hemorrhoids  or anal itching.   LIVALO 1 MG Tabs Generic drug:  Pitavastatin Calcium Take 1 mg by mouth daily.   LORazepam 0.5 MG tablet Commonly known as:  ATIVAN Take 1 tablet (0.5 mg total) by mouth 2 (two) times daily as needed for anxiety.   metoprolol succinate 25 MG 24 hr tablet Commonly known as:  TOPROL-XL Take 25 mg by mouth daily.   multivitamin with minerals Tabs tablet Take 1 tablet by mouth daily.   polyethylene glycol powder powder Commonly known as:  GLYCOLAX/MIRALAX Take 17 g by mouth daily as needed for mild constipation.   SYSTANE OP Apply 1-2 drops to eye 2 (two) times daily.       Allergies  Allergen Reactions  . Aspirin Other (See Comments)    Feels like choking  . Crestor [Rosuvastatin] Other (See Comments)    Lower Extremity Myalgia Lower ext myalgia  . Bactrim Other (See Comments)    Unknown  . Demeclocycline Rash  . Niacin Other (See Comments)    Turns Face Red  . Niacin And Related Other (See Comments)    Turns red in the face  . Penicillins Other (See Comments)    Unknown Reaction Per The Patient Unknown  . Relafen [Nabumetone] Other (See Comments)    Unknown Reaction Per The Patient Unknown  . Sulfa Antibiotics Other (See Comments)    Unknown Reaction Per The Patient Unknown  . Tetracyclines & Related Rash    Consultations:  None   Procedures/Studies: Dg Chest 2 View  Result Date: 02/10/2019 CLINICAL DATA:  85 year old female with history of fever. Confusion. Fall. EXAM: CHEST - 2 VIEW COMPARISON:  Chest x-ray 08/13/2018. FINDINGS: Lung volumes are normal. No consolidative airspace disease. No pleural effusions. No pneumothorax. No pulmonary nodule or mass noted. Severe calcifications of the mitral annulus. Pulmonary vasculature and the cardiomediastinal silhouette are within normal limits. Aortic atherosclerosis. IMPRESSION: 1. No radiographic evidence of acute cardiopulmonary disease. 2. Aortic atherosclerosis. Electronically Signed   By:  Vinnie Langton M.D.   On: 02/10/2019 05:38   Ct Head Wo Contrast  Result Date: 02/10/2019 CLINICAL DATA:  85 y/o  F; unwitnessed fall. EXAM: CT HEAD WITHOUT CONTRAST CT CERVICAL SPINE WITHOUT CONTRAST TECHNIQUE: Multidetector CT imaging of the head and cervical spine was performed following the standard protocol without intravenous contrast. Multiplanar CT image reconstructions of the cervical spine were also generated. COMPARISON:  12/06/2011 CT head.  04/20/2009 cervical MRI. FINDINGS: CT HEAD FINDINGS Brain: No evidence of acute infarction, hemorrhage, hydrocephalus, extra-axial collection or mass lesion/mass effect. Nonspecific white matter hypodensities are compatible chronic microvascular ischemic changes and volume loss of brain. Findings are progressed from 2012. Vascular: Calcific atherosclerosis of the carotid siphons. No hyperdense vessel identified. Skull: Normal. Negative for fracture or focal lesion. Sinuses/Orbits: Mild mucosal thickening of the maxillary sinuses. Additional visible paranasal sinuses and the mastoid  air cells are normally aerated. Orbits are unremarkable. Other: None. CT CERVICAL SPINE FINDINGS Alignment: C2-3 and C3-4 grade 1 anterolisthesis. Skull base and vertebrae: No acute fracture. No primary bone lesion or focal pathologic process. Small calcified odontoid pannus is increased in size. There are small erosive changes of the anterior aspect of the odontoid process. Productive changes of the anterior C1-2 articulation (series 8, image 22). Soft tissues and spinal canal: No prevertebral fluid or swelling. No visible canal hematoma. Disc levels: Multilevel discogenic degenerative changes greatest at the C6-7 level with there is moderate loss of intervertebral disc space height. Left-greater-than-right upper cervical facet arthropathy is greatest at C2-C4. uncovertebral and facet hypertrophy results in left-sided C3-4, bilateral C5-6, and bilateral C6-7 foraminal stenosis. No  high-grade spinal canal stenosis. Upper chest: Negative. Other: Negative. IMPRESSION: CT head: 1. No acute intracranial abnormality identified. 2. Progression of chronic microvascular ischemic changes and volume loss of the brain from 2012. CT cervical spine: 1. No acute fracture or dislocation. 2. Moderate cervical spondylosis greatest at the C6-7 level. Electronically Signed   By: Kristine Garbe M.D.   On: 02/10/2019 05:40   Ct Cervical Spine Wo Contrast  Result Date: 02/10/2019 CLINICAL DATA:  85 y/o  F; unwitnessed fall. EXAM: CT HEAD WITHOUT CONTRAST CT CERVICAL SPINE WITHOUT CONTRAST TECHNIQUE: Multidetector CT imaging of the head and cervical spine was performed following the standard protocol without intravenous contrast. Multiplanar CT image reconstructions of the cervical spine were also generated. COMPARISON:  12/06/2011 CT head.  04/20/2009 cervical MRI. FINDINGS: CT HEAD FINDINGS Brain: No evidence of acute infarction, hemorrhage, hydrocephalus, extra-axial collection or mass lesion/mass effect. Nonspecific white matter hypodensities are compatible chronic microvascular ischemic changes and volume loss of brain. Findings are progressed from 2012. Vascular: Calcific atherosclerosis of the carotid siphons. No hyperdense vessel identified. Skull: Normal. Negative for fracture or focal lesion. Sinuses/Orbits: Mild mucosal thickening of the maxillary sinuses. Additional visible paranasal sinuses and the mastoid air cells are normally aerated. Orbits are unremarkable. Other: None. CT CERVICAL SPINE FINDINGS Alignment: C2-3 and C3-4 grade 1 anterolisthesis. Skull base and vertebrae: No acute fracture. No primary bone lesion or focal pathologic process. Small calcified odontoid pannus is increased in size. There are small erosive changes of the anterior aspect of the odontoid process. Productive changes of the anterior C1-2 articulation (series 8, image 22). Soft tissues and spinal canal: No  prevertebral fluid or swelling. No visible canal hematoma. Disc levels: Multilevel discogenic degenerative changes greatest at the C6-7 level with there is moderate loss of intervertebral disc space height. Left-greater-than-right upper cervical facet arthropathy is greatest at C2-C4. uncovertebral and facet hypertrophy results in left-sided C3-4, bilateral C5-6, and bilateral C6-7 foraminal stenosis. No high-grade spinal canal stenosis. Upper chest: Negative. Other: Negative. IMPRESSION: CT head: 1. No acute intracranial abnormality identified. 2. Progression of chronic microvascular ischemic changes and volume loss of the brain from 2012. CT cervical spine: 1. No acute fracture or dislocation. 2. Moderate cervical spondylosis greatest at the C6-7 level. Electronically Signed   By: Kristine Garbe M.D.   On: 02/10/2019 05:40     Subjective: Patient seen and examined at bedside.  She feels much better.  She wants to be discharged today.  No overnight fever, nausea or vomiting.  Tolerating her diet well.  Discharge Exam: Vitals:   02/11/19 2106 02/12/19 0452  BP: (!) 112/53 (!) 140/56  Pulse: 71 63  Resp: 20 16  Temp: 98.4 F (36.9 C) 97.9 F (36.6 C)  SpO2:  95% 93%   Vitals:   02/11/19 0900 02/11/19 1430 02/11/19 2106 02/12/19 0452  BP: 127/64 114/61 (!) 112/53 (!) 140/56  Pulse: 84 71 71 63  Resp:  18 20 16   Temp:  98.1 F (36.7 C) 98.4 F (36.9 C) 97.9 F (36.6 C)  TempSrc:  Oral Oral Oral  SpO2:  98% 95% 93%  Weight:      Height:        General: Pt is alert, awake, not in acute distress Cardiovascular: rate controlled, S1/S2 + Respiratory: bilateral decreased breath sounds at bases Abdominal: Soft, NT, ND, bowel sounds + Extremities: no edema, no cyanosis    The results of significant diagnostics from this hospitalization (including imaging, microbiology, ancillary and laboratory) are listed below for reference.     Microbiology: Recent Results (from the past  240 hour(s))  Blood Culture (routine x 2)     Status: None (Preliminary result)   Collection Time: 02/10/19  4:56 AM  Result Value Ref Range Status   Specimen Description BLOOD RIGHT ANTECUBITAL  Final   Special Requests   Final    BOTTLES DRAWN AEROBIC AND ANAEROBIC Blood Culture adequate volume   Culture   Final    NO GROWTH 2 DAYS Performed at [facility], [address], [place], [place] [address]    Report Status PENDING  Incomplete  Blood Culture (routine x 2)     Status: None (Preliminary result)   Collection Time: 02/10/19  5:06 AM  Result Value Ref Range Status   Specimen Description BLOOD LEFT ANTECUBITAL  Final   Special Requests   Final    BOTTLES DRAWN AEROBIC AND ANAEROBIC Blood Culture adequate volume   Culture   Final    NO GROWTH 2 DAYS Performed at [facility], [place] [address], [place], [place] [address]    Report Status PENDING  Incomplete  Urine culture     Status: Abnormal   Collection Time: 02/10/19  6:56 AM  Result Value Ref Range Status   Specimen Description URINE, CATHETERIZED  Final   Special Requests   Final    NONE Performed at [facility], [place] [address] [place], [place] [address]    Culture 80,000 COLONIES/mL ESCHERICHIA COLI (A)  Final   Report Status 02/12/2019 FINAL  Final   Organism ID, Bacteria ESCHERICHIA COLI (A)  Final      Susceptibility   Escherichia coli - MIC*    AMPICILLIN <=2 SENSITIVE Sensitive     CEFAZOLIN <=4 SENSITIVE Sensitive     CEFTRIAXONE <=1 SENSITIVE Sensitive     CIPROFLOXACIN <=0.25 SENSITIVE Sensitive     GENTAMICIN <=1 SENSITIVE Sensitive     IMIPENEM <=0.25 SENSITIVE Sensitive     NITROFURANTOIN <=16 SENSITIVE Sensitive     TRIMETH/SULFA <=20 SENSITIVE Sensitive     AMPICILLIN/SULBACTAM <=2 SENSITIVE Sensitive     PIP/TAZO <=4 SENSITIVE Sensitive     Extended ESBL NEGATIVE Sensitive     * 80,000 COLONIES/mL ESCHERICHIA COLI  MRSA PCR Screening     Status: None   Collection Time:  02/10/19  4:09 PM  Result Value Ref Range Status   MRSA by PCR NEGATIVE NEGATIVE Final    Comment:        The GeneXpert MRSA Assay (FDA approved for NASAL specimens only), is one component of a comprehensive MRSA colonization surveillance program. It is not intended to diagnose MRSA infection nor to guide or monitor treatment for MRSA infections. Performed at  [facility], [place] [address], [place], [place] [address]      Labs: BNP (last 3 results) No results for input(s): BNP in the last 8760 hours. Basic Metabolic Panel: Recent Labs  Lab 02/10/19 0446 02/11/19 0348 02/12/19 0439  NA 144 140 143  K 3.2* 3.2* 3.8  CL 107 111 111  CO2 25 21* 23  GLUCOSE 125* 108* 101*  BUN 15 12 9   CREATININE 0.70 0.64 0.54  CALCIUM 9.3 8.1* 8.6*  MG  --   --  1.9   Liver Function Tests: Recent Labs  Lab 02/10/19 0446  AST 33  ALT 16  ALKPHOS 36*  BILITOT 1.4*  PROT 6.2*  ALBUMIN 3.5   No results for input(s): LIPASE, AMYLASE in the last 168 hours. No results for input(s): AMMONIA in the last 168 hours. CBC: Recent Labs  Lab 02/10/19 0446 02/11/19 0348  WBC 8.1 6.1  NEUTROABS 7.0  --   HGB 13.2 11.3*  HCT 40.6 35.3*  MCV 92.7 93.1  PLT 179 163   Cardiac Enzymes: No results for input(s): CKTOTAL, CKMB, CKMBINDEX, TROPONINI in the last 168 hours. BNP: Invalid input(s): POCBNP CBG: No results for input(s): GLUCAP in the last 168 hours. D-Dimer No results for input(s): DDIMER in the last 72 hours. Hgb A1c No results for input(s): HGBA1C in the last 72 hours. Lipid Profile No results for input(s): CHOL, HDL, LDLCALC, TRIG, CHOLHDL, LDLDIRECT in the last 72 hours. Thyroid function studies No results for input(s): TSH, T4TOTAL, T3FREE, THYROIDAB in the last 72 hours.  Invalid input(s): FREET3 Anemia work up No results for input(s): VITAMINB12, FOLATE, FERRITIN, TIBC, IRON, RETICCTPCT in the last 72 hours. Urinalysis    Component Value Date/Time    COLORURINE YELLOW 02/10/2019 0656   APPEARANCEUR HAZY (A) 02/10/2019 0656   LABSPEC 1.021 02/10/2019 0656   PHURINE 5.0 02/10/2019 0656   GLUCOSEU NEGATIVE 02/10/2019 0656   HGBUR MODERATE (A) 02/10/2019 0656   BILIRUBINUR NEGATIVE 02/10/2019 0656   BILIRUBINUR neg 03/07/2015 1506   KETONESUR 20 (A) 02/10/2019 0656   PROTEINUR NEGATIVE 02/10/2019 0656   UROBILINOGEN 0.2 03/07/2015 1506   UROBILINOGEN 0.2 07/30/2014 1236   NITRITE POSITIVE (A) 02/10/2019 0656   LEUKOCYTESUR SMALL (A) 02/10/2019 0656   Sepsis Labs Invalid input(s): PROCALCITONIN,  WBC,  LACTICIDVEN Microbiology Recent Results (from the past 240 hour(s))  Blood Culture (routine x 2)     Status: None (Preliminary result)   Collection Time: 02/10/19  4:56 AM  Result Value Ref Range Status   Specimen Description BLOOD RIGHT ANTECUBITAL  Final   Special Requests   Final    BOTTLES DRAWN AEROBIC AND ANAEROBIC Blood Culture adequate volume   Culture   Final    NO GROWTH 2 DAYS Performed at [facility], [place] [address], [place], [place] [address]    Report Status PENDING  Incomplete  Blood Culture (routine x 2)     Status: None (Preliminary result)   Collection Time: 02/10/19  5:06 AM  Result Value Ref Range Status   Specimen Description BLOOD LEFT ANTECUBITAL  Final   Special Requests   Final    BOTTLES DRAWN AEROBIC AND ANAEROBIC Blood Culture adequate volume   Culture   Final    NO GROWTH 2 DAYS Performed at [facility], [place] [address] [place],  [address]    Report Status PENDING  Incomplete  Urine culture     Status: Abnormal   Collection Time: 02/10/19  6:56 AM  Result Value Ref Range Status   Specimen Description URINE, CATHETERIZED  Final   Special Requests   Final    NONE Performed at [facility], [address] [place], [place] [address]    Culture 80,000 COLONIES/mL ESCHERICHIA COLI (A)  Final   Report Status 02/12/2019 FINAL  Final   Organism ID, Bacteria ESCHERICHIA  COLI (A)  Final      Susceptibility   Escherichia coli - MIC*    AMPICILLIN <=2 SENSITIVE Sensitive     CEFAZOLIN <=4 SENSITIVE Sensitive     CEFTRIAXONE <=1 SENSITIVE Sensitive     CIPROFLOXACIN <=0.25 SENSITIVE Sensitive     GENTAMICIN <=1 SENSITIVE Sensitive     IMIPENEM <=0.25 SENSITIVE Sensitive     NITROFURANTOIN <=16 SENSITIVE Sensitive     TRIMETH/SULFA <=20 SENSITIVE Sensitive     AMPICILLIN/SULBACTAM <=2 SENSITIVE Sensitive     PIP/TAZO <=4 SENSITIVE Sensitive     Extended ESBL NEGATIVE Sensitive     * 80,000 COLONIES/mL ESCHERICHIA COLI  MRSA PCR Screening     Status: None   Collection Time: 02/10/19  4:09 PM  Result Value Ref Range Status   MRSA by PCR NEGATIVE NEGATIVE Final    Comment:        The GeneXpert MRSA Assay (FDA approved for NASAL specimens only), is one component of a comprehensive MRSA colonization surveillance program. It is not intended to diagnose MRSA infection nor to guide or monitor treatment for MRSA infections. Performed at [facility], [place] [address] [place], [place] [address]      Time coordinating discharge: 35 minutes  SIGNED:   Aline August, MD  [facility] 02/12/2019, 10:03 AM

## 2019-02-12 NOTE — NC FL2 (Signed)
[place] MEDICAID FL2 LEVEL OF CARE SCREENING TOOL     IDENTIFICATION  Patient Name: [name] Birthdate: [birth year] Sex: female Admission Date (Current Location): 02/10/2019  [place] and [place] Number:  Herbalist and Address:  [facility] [place], [place] [address], [place], [place] [address]      Provider Number: [id number]  Attending Physician Name and Address:  Aline August, MD  Relative Name and Phone Number:  Lanny Hurst, spouse, [phone]    Current Level of Care: Hospital Recommended Level of Care: [place] Prior Approval Number:    Date Approved/Denied:   PASRR Number:    Discharge Plan: Other (Comment)(ALF)    Current Diagnoses: Patient Active Problem List   Diagnosis Date Noted  . Acute lower UTI 02/10/2019  . Acute metabolic encephalopathy 56/21/3086  . Encephalopathy acute 02/10/2019  . Chest pain, rule out acute myocardial infarction 09/03/2017  . Heart palpitations 07/31/2016  . Neck pain, musculoskeletal 04/26/2015  . Variants of migraine, not elsewhere classified, without mention of intractable migraine without mention of status migrainosus 08/17/2014  . Tension headache 08/17/2014  . TIA (transient ischemic attack) 07/30/2014  . HTN (hypertension) 07/30/2014  . Aortic aneurysm, thoracic ([place]) 06/25/2014  . Chest pain 07/29/2012  . CAD (coronary artery disease) 06/03/2012  . Hypercholesteremia   . GERD (gastroesophageal reflux disease)     Orientation RESPIRATION BLADDER Height & Weight     Self, Time, Situation, Place  Normal Continent Weight: 59.4 kg Height:  5\' 1"  (154.9 cm)  BEHAVIORAL SYMPTOMS/MOOD NEUROLOGICAL BOWEL NUTRITION STATUS      Continent (Regular-no added salt)  AMBULATORY STATUS COMMUNICATION OF NEEDS Skin   Limited Assist Verbally Normal                       Personal Care Assistance Level of Assistance  Bathing, Feeding, Dressing Bathing Assistance: Limited  assistance Feeding assistance: Independent Dressing Assistance: Limited assistance     Functional Limitations Info  Sight, Hearing, Speech Sight Info: Adequate Hearing Info: Adequate Speech Info: Adequate    SPECIAL CARE FACTORS FREQUENCY  PT (By licensed PT), OT (By licensed OT)     PT Frequency: home health PT 5x/week OT Frequency: home health OT 3x/week            Contractures Contractures Info: Not present    Additional Factors Info  Code Status, Allergies Code Status Info: DNR Allergies Info: Aspirin, Crestor Rosuvastatin, Bactrim, Demeclocycline, Niacin, Niacin And Related, Penicillins, Relafen Nabumetone, Sulfa Antibiotics, Tetracyclines & Related           Current Medications (02/12/2019):   Discharge Medications: TAKE these medications   acetaminophen 500 MG tablet Commonly known as:  TYLENOL Take 500 mg by mouth 3 (three) times daily as needed for mild pain.   Calcium-Vitamin D 500-400 MG-UNIT Tabs Take 1 tablet by mouth daily.   cephALEXin 500 MG capsule Commonly known as:  KEFLEX Take 1 capsule (500 mg total) by mouth 3 (three) times daily for 4 days.   cholecalciferol 1000 units tablet Commonly known as:  VITAMIN D Take 2,000 Units by mouth daily.   clopidogrel 75 MG tablet Commonly known as:  PLAVIX Take 75 mg by mouth daily.   famotidine 10 MG tablet Commonly known as:  PEPCID Take 20 mg by mouth 2 (two) times daily.   hydrocortisone 2.5 % rectal cream Commonly known as:  ANUSOL-HC Place 1 application rectally 2 (two) times daily as needed for  hemorrhoids or anal itching.   LIVALO 1 MG Tabs Generic drug:  Pitavastatin Calcium Take 1 mg by mouth daily.   LORazepam 0.5 MG tablet Commonly known as:  ATIVAN Take 1 tablet (0.5 mg total) by mouth 2 (two) times daily as needed for anxiety.   metoprolol succinate 25 MG 24 hr tablet Commonly known as:  TOPROL-XL Take 25 mg by mouth daily.   multivitamin with minerals Tabs  tablet Take 1 tablet by mouth daily.   polyethylene glycol powder powder Commonly known as:  GLYCOLAX/MIRALAX Take 17 g by mouth daily as needed for mild constipation.   SYSTANE OP Apply 1-2 drops to eye 2 (two) times daily.      Relevant Imaging Results:  Relevant Lab Results:   Additional Information SSN: [place], [place]

## 2019-02-12 NOTE — Consult Note (Signed)
            [facility] CM Primary Care Navigator  02/12/2019  [name] [birth year] [id number]                                                                                                                     Went to seepatient in the room to identify possible discharge needs but she was already discharged per RN report.  Patient went back to [facility] facility where she resides prior to admission, with home health services per Inpatient CM report.   PerMD note,patient presented with altered mental status, was admitted for fever and UTI and was treated with antibiotics.                                                                                                                   Patient has discharge instruction to follow-up with primary care provider in 1 week.  Primary care provider's office is ;listed as providing transition of care (TOC).     For additional questions please contact:  Edwena Felty A. Kase Shughart, BSN, RN-BC [facility] PRIMARY CARE Navigator Cell: [phone]

## 2019-02-12 NOTE — Progress Notes (Signed)
[name]  [name]  per MD order.  Discharge instructions reviewed and discussed with the patient, all questions and concerns answered. Copy of instructions, care notes and scripts given to patient.  Allergies as of 02/12/2019      Reactions   Aspirin Other (See Comments)   Feels like choking   Crestor [rosuvastatin] Other (See Comments)   Lower Extremity Myalgia Lower ext myalgia   Bactrim Other (See Comments)   Unknown   Demeclocycline Rash   Niacin Other (See Comments)   Turns Face Red   Niacin And Related Other (See Comments)   Turns red in the face   Penicillins Other (See Comments)   Unknown Reaction Per The Patient Unknown   Relafen [nabumetone] Other (See Comments)   Unknown Reaction Per The Patient Unknown   Sulfa Antibiotics Other (See Comments)   Unknown Reaction Per The Patient Unknown   Tetracyclines & Related Rash      Medication List    TAKE these medications   acetaminophen 500 MG tablet Commonly known as:  TYLENOL Take 500 mg by mouth 3 (three) times daily as needed for mild pain.   Calcium-Vitamin D 500-400 MG-UNIT Tabs Take 1 tablet by mouth daily.   cephALEXin 500 MG capsule Commonly known as:  KEFLEX Take 1 capsule (500 mg total) by mouth 3 (three) times daily for 4 days.   cholecalciferol 1000 units tablet Commonly known as:  VITAMIN D Take 2,000 Units by mouth daily.   clopidogrel 75 MG tablet Commonly known as:  PLAVIX Take 75 mg by mouth daily.   famotidine 10 MG tablet Commonly known as:  PEPCID Take 20 mg by mouth 2 (two) times daily.   hydrocortisone 2.5 % rectal cream Commonly known as:  ANUSOL-HC Place 1 application rectally 2 (two) times daily as needed for hemorrhoids or anal itching.   LIVALO 1 MG Tabs Generic drug:  Pitavastatin Calcium Take 1 mg by mouth daily.   LORazepam 0.5 MG tablet Commonly known as:  ATIVAN Take 1 tablet (0.5 mg total) by mouth 2 (two) times daily as needed for  anxiety.   metoprolol succinate 25 MG 24 hr tablet Commonly known as:  TOPROL-XL Take 25 mg by mouth daily.   multivitamin with minerals Tabs tablet Take 1 tablet by mouth daily.   polyethylene glycol powder powder Commonly known as:  GLYCOLAX/MIRALAX Take 17 g by mouth daily as needed for mild constipation.   SYSTANE OP Apply 1-2 drops to eye 2 (two) times daily.     .  Patient escorted to car by NT/volunteer in a wheelchair,  no distress noted upon discharge.  [name] C 02/12/2019 10:30 AM

## 2019-02-12 NOTE — Progress Notes (Signed)
Removed IVs without complications.

## 2019-02-12 NOTE — Progress Notes (Addendum)
[facility] will [facility] to: [facility] Anticipated DC date: 02/12/2019 Family notified: Audiological scientist by: Daughter by car   CSW went in to complete assessment with patient, however, patient is gone. Per RN, patient's daughter already took her to the ALF.  Per MD patient ready for DC to . RN, patient, patient's family, and facility notified of DC. Discharge Summary and FL2 sent to facility with approval. RN to call report prior to discharge [phone]).   CSW will sign off for now as social work intervention is no longer needed. Please consult [place] again if new needs arise.  Cedric Fishman, LCSW Clinical Social Worker [phone]

## 2019-02-15 LAB — CULTURE, BLOOD (ROUTINE X 2)
Culture: NO GROWTH
Culture: NO GROWTH
Special Requests: ADEQUATE
Special Requests: ADEQUATE

## 2019-02-17 DIAGNOSIS — B9620 Unspecified Escherichia coli [E. coli] as the cause of diseases classified elsewhere: Secondary | ICD-10-CM | POA: Diagnosis not present

## 2019-02-17 DIAGNOSIS — I712 Thoracic aortic aneurysm, without rupture: Secondary | ICD-10-CM | POA: Diagnosis not present

## 2019-02-17 DIAGNOSIS — E7800 Pure hypercholesterolemia, unspecified: Secondary | ICD-10-CM | POA: Diagnosis not present

## 2019-02-17 DIAGNOSIS — I2510 Atherosclerotic heart disease of native coronary artery without angina pectoris: Secondary | ICD-10-CM | POA: Diagnosis not present

## 2019-02-17 DIAGNOSIS — Z8673 Personal history of transient ischemic attack (TIA), and cerebral infarction without residual deficits: Secondary | ICD-10-CM | POA: Diagnosis not present

## 2019-02-17 DIAGNOSIS — K219 Gastro-esophageal reflux disease without esophagitis: Secondary | ICD-10-CM | POA: Diagnosis not present

## 2019-02-17 DIAGNOSIS — Z9181 History of falling: Secondary | ICD-10-CM | POA: Diagnosis not present

## 2019-02-17 DIAGNOSIS — N390 Urinary tract infection, site not specified: Secondary | ICD-10-CM | POA: Diagnosis not present

## 2019-02-17 DIAGNOSIS — I10 Essential (primary) hypertension: Secondary | ICD-10-CM | POA: Diagnosis not present

## 2019-02-17 DIAGNOSIS — Z7902 Long term (current) use of antithrombotics/antiplatelets: Secondary | ICD-10-CM | POA: Diagnosis not present

## 2019-02-17 DIAGNOSIS — G43909 Migraine, unspecified, not intractable, without status migrainosus: Secondary | ICD-10-CM | POA: Diagnosis not present

## 2019-02-19 DIAGNOSIS — E7800 Pure hypercholesterolemia, unspecified: Secondary | ICD-10-CM | POA: Diagnosis not present

## 2019-02-19 DIAGNOSIS — G43909 Migraine, unspecified, not intractable, without status migrainosus: Secondary | ICD-10-CM | POA: Diagnosis not present

## 2019-02-19 DIAGNOSIS — N390 Urinary tract infection, site not specified: Secondary | ICD-10-CM | POA: Diagnosis not present

## 2019-02-19 DIAGNOSIS — Z9181 History of falling: Secondary | ICD-10-CM | POA: Diagnosis not present

## 2019-02-19 DIAGNOSIS — Z7902 Long term (current) use of antithrombotics/antiplatelets: Secondary | ICD-10-CM | POA: Diagnosis not present

## 2019-02-19 DIAGNOSIS — I712 Thoracic aortic aneurysm, without rupture: Secondary | ICD-10-CM | POA: Diagnosis not present

## 2019-02-19 DIAGNOSIS — I2510 Atherosclerotic heart disease of native coronary artery without angina pectoris: Secondary | ICD-10-CM | POA: Diagnosis not present

## 2019-02-19 DIAGNOSIS — I10 Essential (primary) hypertension: Secondary | ICD-10-CM | POA: Diagnosis not present

## 2019-02-19 DIAGNOSIS — B9620 Unspecified Escherichia coli [E. coli] as the cause of diseases classified elsewhere: Secondary | ICD-10-CM | POA: Diagnosis not present

## 2019-02-19 DIAGNOSIS — K219 Gastro-esophageal reflux disease without esophagitis: Secondary | ICD-10-CM | POA: Diagnosis not present

## 2019-02-19 DIAGNOSIS — Z8673 Personal history of transient ischemic attack (TIA), and cerebral infarction without residual deficits: Secondary | ICD-10-CM | POA: Diagnosis not present

## 2019-02-23 DIAGNOSIS — I10 Essential (primary) hypertension: Secondary | ICD-10-CM | POA: Diagnosis not present

## 2019-02-23 DIAGNOSIS — Z6825 Body mass index (BMI) 25.0-25.9, adult: Secondary | ICD-10-CM | POA: Diagnosis not present

## 2019-02-23 DIAGNOSIS — E7849 Other hyperlipidemia: Secondary | ICD-10-CM | POA: Diagnosis not present

## 2019-02-23 DIAGNOSIS — I2510 Atherosclerotic heart disease of native coronary artery without angina pectoris: Secondary | ICD-10-CM | POA: Diagnosis not present

## 2019-02-23 DIAGNOSIS — N390 Urinary tract infection, site not specified: Secondary | ICD-10-CM | POA: Diagnosis not present

## 2019-02-28 ENCOUNTER — Emergency Department (HOSPITAL_COMMUNITY): Payer: PPO

## 2019-02-28 ENCOUNTER — Encounter (HOSPITAL_COMMUNITY): Payer: Self-pay

## 2019-02-28 ENCOUNTER — Other Ambulatory Visit: Payer: Self-pay

## 2019-02-28 ENCOUNTER — Emergency Department (HOSPITAL_COMMUNITY)
Admission: EM | Admit: 2019-02-28 | Discharge: 2019-02-28 | Disposition: A | Discharge status: Home / Self Care | Payer: PPO | Attending: Emergency Medicine | Admitting: Emergency Medicine

## 2019-02-28 DIAGNOSIS — I10 Essential (primary) hypertension: Secondary | ICD-10-CM | POA: Diagnosis not present

## 2019-02-28 DIAGNOSIS — I959 Hypotension, unspecified: Secondary | ICD-10-CM | POA: Diagnosis not present

## 2019-02-28 DIAGNOSIS — Z87891 Personal history of nicotine dependence: Secondary | ICD-10-CM | POA: Insufficient documentation

## 2019-02-28 DIAGNOSIS — Z7902 Long term (current) use of antithrombotics/antiplatelets: Secondary | ICD-10-CM | POA: Diagnosis not present

## 2019-02-28 DIAGNOSIS — Z79899 Other long term (current) drug therapy: Secondary | ICD-10-CM | POA: Insufficient documentation

## 2019-02-28 DIAGNOSIS — R531 Weakness: Secondary | ICD-10-CM | POA: Diagnosis not present

## 2019-02-28 DIAGNOSIS — I2510 Atherosclerotic heart disease of native coronary artery without angina pectoris: Secondary | ICD-10-CM | POA: Diagnosis not present

## 2019-02-28 DIAGNOSIS — R400 Somnolence: Secondary | ICD-10-CM | POA: Diagnosis not present

## 2019-02-28 DIAGNOSIS — Z8673 Personal history of transient ischemic attack (TIA), and cerebral infarction without residual deficits: Secondary | ICD-10-CM | POA: Insufficient documentation

## 2019-02-28 DIAGNOSIS — R42 Dizziness and giddiness: Secondary | ICD-10-CM | POA: Diagnosis not present

## 2019-02-28 DIAGNOSIS — E7800 Pure hypercholesterolemia, unspecified: Secondary | ICD-10-CM | POA: Diagnosis not present

## 2019-02-28 DIAGNOSIS — R001 Bradycardia, unspecified: Secondary | ICD-10-CM | POA: Diagnosis not present

## 2019-02-28 LAB — CBC WITH DIFFERENTIAL/PLATELET
Abs Immature Granulocytes: 0.02 10*3/uL (ref 0.00–0.07)
Basophils Absolute: 0.1 10*3/uL (ref 0.0–0.1)
Basophils Relative: 1 %
EOS ABS: 0.2 10*3/uL (ref 0.0–0.5)
Eosinophils Relative: 3 %
HCT: 40.6 % (ref 36.0–46.0)
Hemoglobin: 12.8 g/dL (ref 12.0–15.0)
Immature Granulocytes: 0 %
Lymphocytes Relative: 22 %
Lymphs Abs: 1.2 10*3/uL (ref 0.7–4.0)
MCH: 29.6 pg (ref 26.0–34.0)
MCHC: 31.5 g/dL (ref 30.0–36.0)
MCV: 94 fL (ref 80.0–100.0)
Monocytes Absolute: 0.7 10*3/uL (ref 0.1–1.0)
Monocytes Relative: 12 %
Neutro Abs: 3.3 10*3/uL (ref 1.7–7.7)
Neutrophils Relative %: 62 %
Platelets: 221 10*3/uL (ref 150–400)
RBC: 4.32 MIL/uL (ref 3.87–5.11)
RDW: 12.8 % (ref 11.5–15.5)
WBC: 5.4 10*3/uL (ref 4.0–10.5)
nRBC: 0 % (ref 0.0–0.2)

## 2019-02-28 LAB — URINALYSIS, ROUTINE W REFLEX MICROSCOPIC
BILIRUBIN URINE: NEGATIVE
Glucose, UA: NEGATIVE mg/dL
Hgb urine dipstick: NEGATIVE
KETONES UR: NEGATIVE mg/dL
Leukocytes,Ua: NEGATIVE
Nitrite: NEGATIVE
Protein, ur: NEGATIVE mg/dL
Specific Gravity, Urine: 1.004 — ABNORMAL LOW (ref 1.005–1.030)
pH: 6 (ref 5.0–8.0)

## 2019-02-28 LAB — BASIC METABOLIC PANEL
ANION GAP: 9 (ref 5–15)
BUN: 13 mg/dL (ref 8–23)
CO2: 28 mmol/L (ref 22–32)
Calcium: 9.6 mg/dL (ref 8.9–10.3)
Chloride: 104 mmol/L (ref 98–111)
Creatinine, Ser: 0.74 mg/dL (ref 0.44–1.00)
GFR calc Af Amer: >60 mL/min (ref >60)
GFR calc non Af Amer: >60 mL/min (ref >60)
Glucose, Bld: 102 mg/dL — ABNORMAL HIGH (ref 70–99)
Potassium: 4.2 mmol/L (ref 3.5–5.1)
Sodium: 141 mmol/L (ref 135–145)

## 2019-02-28 LAB — I-STAT TROPONIN, ED: Troponin i, poc: 0.01 ng/mL (ref 0.00–0.08)

## 2019-02-28 MED ORDER — SODIUM CHLORIDE 0.9 % IV BOLUS: 1000.0000 mL | Freq: Once | INTRAVENOUS | Status: DC

## 2019-02-28 NOTE — Discharge Instructions (Addendum)
Follow-up with primary care doctor as discussed.  Return to the ED if symptoms worsen.

## 2019-02-28 NOTE — ED Notes (Signed)
Patient verbalizes understanding of discharge instructions. Opportunity for questioning and answers were provided. Armband removed by staff, pt discharged from ED.  

## 2019-02-28 NOTE — ED Triage Notes (Signed)
Pt from [facility] with ems for generalized weakness and dizziness since 10am. Pt alert and oriented x4. Denies n.v.d. Light sensitivity noted. Recently treated for a UTI. No unilateral weakness, facial droop or slurred speech noted at this time.  BP 117/62 P 58 rr18 CBG 126 20G LFA

## 2019-02-28 NOTE — ED Notes (Signed)
Patient transported to CT 

## 2019-02-28 NOTE — ED Provider Notes (Signed)
[facility] EMERGENCY DEPARTMENT Provider Note   CSN: [id number] Arrival date & time: 02/28/19  1215    History   Chief Complaint Chief Complaint  Patient presents with  . Weakness    HPI [name] is a 85 y.o. female.     The history is provided by the patient.  Weakness  Severity:  Moderate Onset quality:  Sudden Timing:  Rare Progression:  Improving Chronicity:  New Context: not alcohol use, not change in medication, not decreased sleep, not drug use, not recent infection and not stress   Relieved by:  Rest Worsened by:  Nothing Associated symptoms: no abdominal pain, no anorexia, no aphasia, no arthralgias, no ataxia, no chest pain, no cough, no diarrhea, no difficulty walking, no dizziness, no drooling, no dysphagia, no dysuria, no numbness in extremities, no falls, no fever, no foul-smelling urine, no frequency, no headaches, no lethargy, no loss of consciousness, no myalgias, no nausea, no seizures, no sensory-motor deficit, no shortness of breath, no urgency, no vision change and no vomiting   Risk factors: no new medications     Past Medical History:  Diagnosis Date  . Aortic aneurysm, thoracic (HCC)    4.4 cm  . Aortic dilatation ([place])    Seen on cath 2005  . Arthritis   . CAD (coronary artery disease)    a. Nonobstructive by last cath in 05/2004 with 30% LAD. b. EF 65-70% 12/2010. c. Hx ASA intolerance.   . Colitis    12/2010 associated with transient lower GI bleeding/anemia tx with abx  . GERD (gastroesophageal reflux disease)    a. Minimal hiatal hernia on EGD 2006, no obvious esophageal source for CP at that time.  . Hypercholesteremia    Hx of myalgias felt to be statin related but taking Crestor as OP  . Hypertension   . Osteoporosis   . Stroke [facility])    TIA    Patient Active Problem List   Diagnosis Date Noted  . Acute lower UTI 02/10/2019  . Acute metabolic encephalopathy 30/86/5784  . Encephalopathy acute  02/10/2019  . Chest pain, rule out acute myocardial infarction 09/03/2017  . Heart palpitations 07/31/2016  . Neck pain, musculoskeletal 04/26/2015  . Variants of migraine, not elsewhere classified, without mention of intractable migraine without mention of status migrainosus 08/17/2014  . Tension headache 08/17/2014  . TIA (transient ischemic attack) 07/30/2014  . HTN (hypertension) 07/30/2014  . Aortic aneurysm, thoracic ([place]) 06/25/2014  . Chest pain 07/29/2012  . CAD (coronary artery disease) 06/03/2012  . Hypercholesteremia   . GERD (gastroesophageal reflux disease)     Past Surgical History:  Procedure Laterality Date  . ABDOMINAL HYSTERECTOMY    . BACK SURGERY     Disc disease  . hysterectomy -- unknown type    . SHOULDER SURGERY    . Shunt for syringomyelia 1987    . TEMPORAL ARTERY BIOPSY / LIGATION     Negative 2005     OB History   No obstetric history on file.      Home Medications    Prior to Admission medications   Medication Sig Start Date End Date Taking? Authorizing Provider  acetaminophen (TYLENOL) 500 MG tablet Take 500 mg by mouth 3 (three) times daily as needed for mild pain.   Yes [provider]  Calcium Carb-Cholecalciferol (CALCIUM-VITAMIN D) 500-400 MG-UNIT TABS Take 1 tablet by mouth daily.   Yes [provider]  cholecalciferol (VITAMIN D) 1000 UNITS tablet Take 2,000  Units by mouth daily.    Yes [provider]  clopidogrel (PLAVIX) 75 MG tablet Take 75 mg by mouth daily.   Yes [provider]  famotidine (PEPCID) 10 MG tablet Take 20 mg by mouth 2 (two) times daily.   Yes [provider]  hydrocortisone (ANUSOL-HC) 2.5 % rectal cream Place 1 application rectally 2 (two) times daily as needed for hemorrhoids or anal itching.   Yes [provider]  LORazepam (ATIVAN) 0.5 MG tablet Take 1 tablet (0.5 mg total) by mouth 2 (two) times daily as needed for anxiety. 02/12/19  Yes Aline August, MD    metoprolol succinate (TOPROL-XL) 25 MG 24 hr tablet Take 25 mg by mouth daily.   Yes [provider]  Multiple Vitamins-Minerals (CENTRUM SILVER) CHEW Chew 1 tablet by mouth daily.   Yes [provider]  Polyethyl Glycol-Propyl Glycol (SYSTANE OP) Apply 1-2 drops to eye 2 (two) times daily.   Yes [provider]  polyethylene glycol powder (GLYCOLAX/MIRALAX) powder Take 17 g by mouth daily as needed for mild constipation. 02/12/19  Yes Aline August, MD  Multiple Vitamin (MULTIVITAMIN WITH MINERALS) TABS tablet Take 1 tablet by mouth daily.    [provider]    Family History Family History  Problem Relation Age of Onset  . Heart disease Mother   . Heart failure Mother   . Arthritis Mother   . Heart disease Brother   . Heart disease Other   . Heart failure Other     Social History Social History   Tobacco Use  . Smoking status: Former Smoker    Last attempt to quit: 04/26/1959    Years since quitting: 59.8  . Smokeless tobacco: Never Used  Substance Use Topics  . Alcohol use: No    Alcohol/week: 1.0 standard drinks    Types: 1 Glasses of wine per week  . Drug use: No     Allergies   Aspirin; Crestor [rosuvastatin]; Bactrim; Demeclocycline; Niacin; Niacin and related; Penicillins; Relafen [nabumetone]; Sulfa antibiotics; and Tetracyclines & related   Review of Systems Review of Systems  Constitutional: Negative for chills and fever.  HENT: Negative for drooling, ear pain and sore throat.   Eyes: Negative for pain and visual disturbance.  Respiratory: Negative for cough and shortness of breath.   Cardiovascular: Negative for chest pain and palpitations.  Gastrointestinal: Negative for abdominal pain, anorexia, diarrhea, dysphagia, nausea and vomiting.  Genitourinary: Negative for dysuria, frequency, hematuria and urgency.  Musculoskeletal: Negative for arthralgias, back pain, falls and myalgias.  Skin: Negative for color change and  rash.  Neurological: Positive for weakness. Negative for dizziness, tremors, seizures, loss of consciousness, syncope, facial asymmetry, light-headedness, numbness and headaches.  All other systems reviewed and are negative.    Physical Exam Updated Vital Signs  ED Triage Vitals  Enc Vitals Group     BP 02/28/19 1223 (!) 149/58     Pulse Rate 02/28/19 1223 (!) 56     Resp 02/28/19 1223 18     Temp 02/28/19 1230 (!) 97.4 F (36.3 C)     Temp Source 02/28/19 1223 Oral     SpO2 02/28/19 1223 97 %     Weight --      Height --      Head Circumference --      Peak Flow --      Pain Score 02/28/19 1222 0     Pain Loc --      Pain Edu? --  Excl. in [place]? --     Physical Exam Vitals signs and nursing note reviewed.  Constitutional:      General: She is not in acute distress.    Appearance: Normal appearance. She is well-developed. She is not ill-appearing.  HENT:     Head: Normocephalic and atraumatic.     Nose: Nose normal.     Mouth/Throat:     Mouth: Mucous membranes are moist.  Eyes:     Extraocular Movements: Extraocular movements intact.     Conjunctiva/sclera: Conjunctivae normal.     Pupils: Pupils are equal, round, and reactive to light.  Neck:     Musculoskeletal: Normal range of motion and neck supple. No muscular tenderness.  Cardiovascular:     Rate and Rhythm: Regular rhythm. Bradycardia present.     Pulses: Normal pulses.     Heart sounds: Normal heart sounds. No murmur.  Pulmonary:     Effort: Pulmonary effort is normal. No respiratory distress.     Breath sounds: Normal breath sounds.  Abdominal:     Palpations: Abdomen is soft.     Tenderness: There is no abdominal tenderness.  Skin:    General: Skin is warm and dry.  Neurological:     General: No focal deficit present.     Mental Status: She is alert and oriented to person, place, and time.     Cranial Nerves: No cranial nerve deficit.     Sensory: No sensory deficit.     Motor: No weakness.      Coordination: Coordination normal.     Gait: Gait normal.     Comments: 5+ out of 5 strength throughout, normal sensation, normal finger-nose-finger, normal gait, no aphasia, normal speech      ED Treatments / Results  Labs (all labs ordered are listed, but only abnormal results are displayed) Labs Reviewed  URINALYSIS, ROUTINE W REFLEX MICROSCOPIC - Abnormal; Notable for the following components:      Result Value   Specific Gravity, Urine 1.004 (*)    All other components within normal limits  BASIC METABOLIC PANEL - Abnormal; Notable for the following components:   Glucose, Bld 102 (*)    All other components within normal limits  CBC WITH DIFFERENTIAL/PLATELET  I-STAT TROPONIN, ED    EKG EKG Interpretation  Date/Time:  Saturday February 28 2019 12:23:42 EST Ventricular Rate:  59 PR Interval:    QRS Duration: 83 QT Interval:  460 QTC Calculation: 456 R Axis:   -48 Text Interpretation:  Sinus rhythm Left anterior fascicular block Low voltage, precordial leads Confirmed by Lennice Sites [phone]) on 02/28/2019 1:09:04 PM   Radiology Dg Chest 2 View  Result Date: 02/28/2019 CLINICAL DATA:  85 year old female with weakness. EXAM: CHEST - 2 VIEW COMPARISON:  02/11/2000 FINDINGS: Cardiomegaly again noted. There is no evidence of focal airspace disease, pulmonary edema, suspicious pulmonary nodule/mass, pleural effusion, or pneumothorax. No acute bony abnormalities are identified. IMPRESSION: Cardiomegaly without evidence of acute cardiopulmonary disease. Electronically Signed   By: Margarette Canada M.D.   On: 02/28/2019 14:00   Ct Head Wo Contrast  Result Date: 02/28/2019 CLINICAL DATA:  85 year old female with acute weakness and altered level of consciousness. EXAM: CT HEAD WITHOUT CONTRAST TECHNIQUE: Contiguous axial images were obtained from the base of the skull through the vertex without intravenous contrast. COMPARISON:  02/10/2019 and prior CTs FINDINGS: Brain: No evidence of  acute infarction, hemorrhage, hydrocephalus, extra-axial collection or mass lesion/mass effect. Atrophy and chronic small-vessel white matter ischemic  changes again noted. Vascular: Carotid and basilar atherosclerotic calcifications noted. Skull: Normal. Negative for fracture or focal lesion. Sinuses/Orbits: No acute finding. Other: None. IMPRESSION: No evidence of acute intracranial abnormality. Atrophy and chronic small-vessel white matter ischemic changes Electronically Signed   By: Margarette Canada M.D.   On: 02/28/2019 14:23    Procedures Procedures (including critical care time)  Medications Ordered in ED Medications - No data to display   Initial Impression / Assessment and Plan / ED Course  I have reviewed the triage vital signs and the nursing notes.  Pertinent labs & imaging results that were available during my care of the patient were reviewed by me and considered in my medical decision making (see chart for details).        [name] is a 85 year old female with history of nonobstructive CAD, hypertension, high cholesterol, TIA who presents to the ED after episode of weakness and lightheadedness.  Overall symptoms have improved.  Patient with normal vitals.  No fever.  Patient states that around 10 AM she had an episode of generalized weakness in her legs.  Felt lightheaded.  She thought possibly she had some difficulty with speaking but does not have any issues now.  Did not have any unilateral weakness in her arms or legs.  Did take her morning medications which included metoprolol.  She is mildly bradycardic here with a sinus rhythm.  Blood pressure is in the 768 systolic.  Overall she is neurologically intact.  She has felt increasingly better as she has rested.  She denies any chest pain, shortness of breath, abdominal pain. Lab work obtained showed no significant anemia, leukocytosis, electrolyte abnormality.  No kidney injury.  Troponin within normal limits.  CT of the  head showed no acute findings.  Chest x-ray showed no signs of pneumonia, pneumothorax, pleural effusion.  Patient had no urinary tract infection.  She is able to ambulate and eat and drink without any issues in the ED.  After prolonged discussion with the patient and her daughter there was shared decision to discharge the patient back to her skilled nursing facility.  They have follow-up with primary care doctor in place next week.  There was a small concern for possible TIA given possible speech changes during the event however she is already on Plavix.  Patient is a DNR and at this time does not want to pursue any further studies for stroke work up.  Possible also medication side effect possibly due to metoprolol as patient has blood pressure that is 115 systolic and a heart rate of 50.  Discussed with the patient's daughter about possibly cutting back on her beta-blocker as she may have some orthostasis as well. No chest pain and reassuring EKG and trop and doubt cardiac process. Patient overall asymptomatic at time of discharge and given return precautions.  This chart was dictated using voice recognition software.  Despite best efforts to proofread,  errors can occur which can change the documentation meaning.   Final Clinical Impressions(s) / ED Diagnoses   Final diagnoses:  Weakness    ED Discharge Orders    None       Lennice Sites, DO 02/28/19 1606

## 2019-02-28 NOTE — ED Notes (Signed)
Pt attempting bedpan

## 2019-03-02 ENCOUNTER — Emergency Department (HOSPITAL_COMMUNITY): Payer: PPO

## 2019-03-02 ENCOUNTER — Other Ambulatory Visit: Payer: Self-pay

## 2019-03-02 ENCOUNTER — Emergency Department (HOSPITAL_COMMUNITY)
Admission: EM | Admit: 2019-03-02 | Discharge: 2019-03-02 | Disposition: A | Discharge status: Home / Self Care | Payer: PPO | Attending: Emergency Medicine | Admitting: Emergency Medicine

## 2019-03-02 ENCOUNTER — Encounter (HOSPITAL_COMMUNITY): Payer: Self-pay | Admitting: Emergency Medicine

## 2019-03-02 DIAGNOSIS — I10 Essential (primary) hypertension: Secondary | ICD-10-CM | POA: Diagnosis not present

## 2019-03-02 DIAGNOSIS — R4020 Unspecified coma: Secondary | ICD-10-CM | POA: Diagnosis not present

## 2019-03-02 DIAGNOSIS — Z8673 Personal history of transient ischemic attack (TIA), and cerebral infarction without residual deficits: Secondary | ICD-10-CM | POA: Insufficient documentation

## 2019-03-02 DIAGNOSIS — R4182 Altered mental status, unspecified: Secondary | ICD-10-CM | POA: Diagnosis present

## 2019-03-02 DIAGNOSIS — N390 Urinary tract infection, site not specified: Secondary | ICD-10-CM | POA: Diagnosis not present

## 2019-03-02 DIAGNOSIS — I2510 Atherosclerotic heart disease of native coronary artery without angina pectoris: Secondary | ICD-10-CM | POA: Insufficient documentation

## 2019-03-02 DIAGNOSIS — Z87891 Personal history of nicotine dependence: Secondary | ICD-10-CM | POA: Insufficient documentation

## 2019-03-02 DIAGNOSIS — R404 Transient alteration of awareness: Secondary | ICD-10-CM | POA: Diagnosis not present

## 2019-03-02 DIAGNOSIS — Z79899 Other long term (current) drug therapy: Secondary | ICD-10-CM | POA: Insufficient documentation

## 2019-03-02 DIAGNOSIS — Z7901 Long term (current) use of anticoagulants: Secondary | ICD-10-CM | POA: Diagnosis not present

## 2019-03-02 LAB — CBC WITH DIFFERENTIAL/PLATELET
Abs Immature Granulocytes: 0.01 10*3/uL (ref 0.00–0.07)
Basophils Absolute: 0.1 10*3/uL (ref 0.0–0.1)
Basophils Relative: 2 %
EOS ABS: 0.2 10*3/uL (ref 0.0–0.5)
EOS PCT: 4 %
HCT: 44.6 % (ref 36.0–46.0)
Hemoglobin: 13.6 g/dL (ref 12.0–15.0)
Immature Granulocytes: 0 %
Lymphocytes Relative: 23 %
Lymphs Abs: 1.2 10*3/uL (ref 0.7–4.0)
MCH: 30 pg (ref 26.0–34.0)
MCHC: 30.5 g/dL (ref 30.0–36.0)
MCV: 98.2 fL (ref 80.0–100.0)
Monocytes Absolute: 0.8 10*3/uL (ref 0.1–1.0)
Monocytes Relative: 16 %
NRBC: 0 % (ref 0.0–0.2)
Neutro Abs: 2.7 10*3/uL (ref 1.7–7.7)
Neutrophils Relative %: 55 %
Platelets: 231 10*3/uL (ref 150–400)
RBC: 4.54 MIL/uL (ref 3.87–5.11)
RDW: 12.9 % (ref 11.5–15.5)
WBC: 4.9 10*3/uL (ref 4.0–10.5)

## 2019-03-02 LAB — URINALYSIS, ROUTINE W REFLEX MICROSCOPIC
Bilirubin Urine: NEGATIVE
GLUCOSE, UA: NEGATIVE mg/dL
Hgb urine dipstick: NEGATIVE
Ketones, ur: NEGATIVE mg/dL
Nitrite: NEGATIVE
Protein, ur: NEGATIVE mg/dL
Specific Gravity, Urine: 1.008 (ref 1.005–1.030)
pH: 7 (ref 5.0–8.0)

## 2019-03-02 LAB — COMPREHENSIVE METABOLIC PANEL
ALT: 13 U/L (ref 0–44)
ANION GAP: 9 (ref 5–15)
AST: 23 U/L (ref 15–41)
Albumin: 3.3 g/dL — ABNORMAL LOW (ref 3.5–5.0)
Alkaline Phosphatase: 44 U/L (ref 38–126)
BILIRUBIN TOTAL: 0.4 mg/dL (ref 0.3–1.2)
BUN: 13 mg/dL (ref 8–23)
CO2: 25 mmol/L (ref 22–32)
Calcium: 9.2 mg/dL (ref 8.9–10.3)
Chloride: 105 mmol/L (ref 98–111)
Creatinine, Ser: 0.69 mg/dL (ref 0.44–1.00)
GFR calc Af Amer: >60 mL/min (ref >60)
GFR calc non Af Amer: >60 mL/min (ref >60)
Glucose, Bld: 115 mg/dL — ABNORMAL HIGH (ref 70–99)
Potassium: 3.6 mmol/L (ref 3.5–5.1)
Sodium: 139 mmol/L (ref 135–145)
TOTAL PROTEIN: 6.4 g/dL — AB (ref 6.5–8.1)

## 2019-03-02 MED ORDER — CEPHALEXIN 250 MG PO CAPS
500.0000 mg | ORAL_CAPSULE | Freq: Once | ORAL | Status: AC
  Administered 2019-03-02: 500 mg via ORAL
  Filled 2019-03-02: qty 2

## 2019-03-02 MED ORDER — CEPHALEXIN 500 MG PO CAPS: 500.0000 mg | ORAL_CAPSULE | Freq: Three times a day (TID) | ORAL | 0 refills | Status: DC

## 2019-03-02 NOTE — ED Notes (Signed)
RN informed Pt can receive visitor  

## 2019-03-02 NOTE — ED Triage Notes (Signed)
Pt from [facility], pt takes care of husband at home, daughter spoke to pt at 1300 and she was at baseline, daughter came to visit at 1500 and reprotes pt was slightly confused (thought it was a different time of day), EMS reported 1720 that pupils were noticeably unequal and pt was confused about left vs right side of body but was LVO negative. Pt can now differentiate right vs left, pt ambulatory to bed from stretcher.

## 2019-03-02 NOTE — ED Provider Notes (Signed)
[facility] EMERGENCY DEPARTMENT Provider Note   CSN: [id number] Arrival date & time: 03/02/19  1813    History   Chief Complaint Chief Complaint  Patient presents with  . Altered Mental Status    HPI [name] is a 85 y.o. female.     85 year old female presents with altered mental status that started several hours ago.  Patient was noted to be confused but resolved on its own.  She has had no medication changes.  No recent history of trauma.  She denies any headache.  No recent urinary symptoms.  No fever or chills.  No weakness in her arms or legs.  She has had no facial droop.  Had transient confusion which is since resolved.  EMS was called blood sugar was above 100 and she was transported here     Past Medical History:  Diagnosis Date  . Aortic aneurysm, thoracic (HCC)    4.4 cm  . Aortic dilatation ([place])    Seen on cath 2005  . Arthritis   . CAD (coronary artery disease)    a. Nonobstructive by last cath in 05/2004 with 30% LAD. b. EF 65-70% 12/2010. c. Hx ASA intolerance.   . Colitis    12/2010 associated with transient lower GI bleeding/anemia tx with abx  . GERD (gastroesophageal reflux disease)    a. Minimal hiatal hernia on EGD 2006, no obvious esophageal source for CP at that time.  . Hypercholesteremia    Hx of myalgias felt to be statin related but taking Crestor as OP  . Hypertension   . Osteoporosis   . Stroke [facility])    TIA    Patient Active Problem List   Diagnosis Date Noted  . Acute lower UTI 02/10/2019  . Acute metabolic encephalopathy 42/70/6237  . Encephalopathy acute 02/10/2019  . Chest pain, rule out acute myocardial infarction 09/03/2017  . Heart palpitations 07/31/2016  . Neck pain, musculoskeletal 04/26/2015  . Variants of migraine, not elsewhere classified, without mention of intractable migraine without mention of status migrainosus 08/17/2014  . Tension headache 08/17/2014  . TIA (transient ischemic attack)  07/30/2014  . HTN (hypertension) 07/30/2014  . Aortic aneurysm, thoracic ([place]) 06/25/2014  . Chest pain 07/29/2012  . CAD (coronary artery disease) 06/03/2012  . Hypercholesteremia   . GERD (gastroesophageal reflux disease)     Past Surgical History:  Procedure Laterality Date  . ABDOMINAL HYSTERECTOMY    . BACK SURGERY     Disc disease  . hysterectomy -- unknown type    . SHOULDER SURGERY    . Shunt for syringomyelia 1987    . TEMPORAL ARTERY BIOPSY / LIGATION     Negative 2005     OB History   No obstetric history on file.      Home Medications    Prior to Admission medications   Medication Sig Start Date End Date Taking? Authorizing Provider  acetaminophen (TYLENOL) 500 MG tablet Take 500 mg by mouth 3 (three) times daily as needed for mild pain.    [provider]  Calcium Carb-Cholecalciferol (CALCIUM-VITAMIN D) 500-400 MG-UNIT TABS Take 1 tablet by mouth daily.    [provider]  cholecalciferol (VITAMIN D) 1000 UNITS tablet Take 2,000 Units by mouth daily.     [provider]  clopidogrel (PLAVIX) 75 MG tablet Take 75 mg by mouth daily.    [provider]  famotidine (PEPCID) 10 MG tablet Take 20 mg by mouth 2 (two) times daily.  [provider]  hydrocortisone (ANUSOL-HC) 2.5 % rectal cream Place 1 application rectally 2 (two) times daily as needed for hemorrhoids or anal itching.    [provider]  LORazepam (ATIVAN) 0.5 MG tablet Take 1 tablet (0.5 mg total) by mouth 2 (two) times daily as needed for anxiety. 02/12/19   Aline August, MD  metoprolol succinate (TOPROL-XL) 25 MG 24 hr tablet Take 25 mg by mouth daily.    [provider]  Multiple Vitamin (MULTIVITAMIN WITH MINERALS) TABS tablet Take 1 tablet by mouth daily.    [provider]  Multiple Vitamins-Minerals (CENTRUM SILVER) CHEW Chew 1 tablet by mouth daily.    [provider]  Polyethyl Glycol-Propyl Glycol (SYSTANE OP)  Apply 1-2 drops to eye 2 (two) times daily.    [provider]  polyethylene glycol powder (GLYCOLAX/MIRALAX) powder Take 17 g by mouth daily as needed for mild constipation. 02/12/19   Aline August, MD    Family History Family History  Problem Relation Age of Onset  . Heart disease Mother   . Heart failure Mother   . Arthritis Mother   . Heart disease Brother   . Heart disease Other   . Heart failure Other     Social History Social History   Tobacco Use  . Smoking status: Former Smoker    Last attempt to quit: 04/26/1959    Years since quitting: 59.8  . Smokeless tobacco: Never Used  Substance Use Topics  . Alcohol use: No    Alcohol/week: 1.0 standard drinks    Types: 1 Glasses of wine per week  . Drug use: No     Allergies   Aspirin; Crestor [rosuvastatin]; Bactrim; Demeclocycline; Niacin; Niacin and related; Penicillins; Relafen [nabumetone]; Sulfa antibiotics; and Tetracyclines & related   Review of Systems Review of Systems  All other systems reviewed and are negative.    Physical Exam Updated Vital Signs BP (!) 143/74 (BP Location: Left Arm)   Pulse 78   Temp 98.1 F (36.7 C) (Oral)   Resp 18   SpO2 95%   Physical Exam Vitals signs and nursing note reviewed.  Constitutional:      General: She is not in acute distress.    Appearance: Normal appearance. She is well-developed. She is not toxic-appearing.  HENT:     Head: Normocephalic and atraumatic.  Eyes:     General: Lids are normal.     Conjunctiva/sclera: Conjunctivae normal.     Pupils: Pupils are equal, round, and reactive to light.  Neck:     Musculoskeletal: Normal range of motion and neck supple.     Thyroid: No thyroid mass.     Trachea: No tracheal deviation.  Cardiovascular:     Rate and Rhythm: Normal rate and regular rhythm.     Heart sounds: Normal heart sounds. No murmur. No gallop.   Pulmonary:     Effort: Pulmonary effort is normal. No respiratory distress.      Breath sounds: Normal breath sounds. No stridor. No decreased breath sounds, wheezing, rhonchi or rales.  Abdominal:     General: Bowel sounds are normal. There is no distension.     Palpations: Abdomen is soft.     Tenderness: There is no abdominal tenderness. There is no rebound.  Musculoskeletal: Normal range of motion.        General: No tenderness.  Skin:    General: Skin is warm and dry.     Findings: No abrasion or rash.  Neurological:  Mental Status: She is alert and oriented to person, place, and time.     GCS: GCS eye subscore is 4. GCS verbal subscore is 5. GCS motor subscore is 6.     Cranial Nerves: No cranial nerve deficit or dysarthria.     Sensory: No sensory deficit.     Motor: No weakness or tremor.     Coordination: Finger-Nose-Finger Test normal.  Psychiatric:        Speech: Speech normal.        Behavior: Behavior normal.      ED Treatments / Results  Labs (all labs ordered are listed, but only abnormal results are displayed) Labs Reviewed  URINE CULTURE  CBC WITH DIFFERENTIAL/PLATELET  COMPREHENSIVE METABOLIC PANEL  URINALYSIS, ROUTINE W REFLEX MICROSCOPIC    EKG None  Radiology No results found.  Procedures Procedures (including critical care time)  Medications Ordered in ED Medications - No data to display   Initial Impression / Assessment and Plan / ED Course  I have reviewed the triage vital signs and the nursing notes.  Pertinent labs & imaging results that were available during my care of the patient were reviewed by me and considered in my medical decision making (see chart for details).        Patient is urinalysis shows early infection.  Given Keflex here as well as prescription for same.  Discussed possibility that this could be a TIA but patient already on Plavix and had recent visit here for same.  Family does not wish any further Evaluation for TIA.  Return precautions given Final Clinical Impressions(s) / ED Diagnoses    Final diagnoses:  None    ED Discharge Orders    None       Lacretia Leigh, MD 03/02/19 2102

## 2019-03-02 NOTE — ED Notes (Signed)
Discharge instructions and prescription discussed with Pt. Pt verbalized understanding. Pt stable and ambulatory.    

## 2019-03-02 NOTE — ED Notes (Signed)
Patient transported to CT 

## 2019-03-03 ENCOUNTER — Other Ambulatory Visit: Payer: Self-pay | Admitting: *Deleted

## 2019-03-03 NOTE — Patient Outreach (Signed)
[facility]) Care Management  03/03/2019  [name] 07-04-1926 [id number]   Referral received 3/3 (Telephone Assessment)  RN attempted the initial outreach call today to the home number that was unsuccessful and RN unable to leave a message to this contact number. RN also outreached to the daughter's contact noted via [place] [employer] [phone]). Unsuccessful but able to leave a HIPAA approved voice message requesting a call back. RN also sent outreach letter to the pt. Will scheduled another outreach call this week and further engage at the time of contact with this pt or involved caregivers.   [name], RN Care Management Coordinator [place] Office [phone]

## 2019-03-04 LAB — URINE CULTURE: Culture: <10000 — AB

## 2019-03-05 ENCOUNTER — Other Ambulatory Visit: Payer: Self-pay | Admitting: *Deleted

## 2019-03-05 DIAGNOSIS — R410 Disorientation, unspecified: Secondary | ICD-10-CM | POA: Diagnosis not present

## 2019-03-05 DIAGNOSIS — I712 Thoracic aortic aneurysm, without rupture: Secondary | ICD-10-CM | POA: Diagnosis not present

## 2019-03-05 DIAGNOSIS — I10 Essential (primary) hypertension: Secondary | ICD-10-CM | POA: Diagnosis not present

## 2019-03-05 DIAGNOSIS — E785 Hyperlipidemia, unspecified: Secondary | ICD-10-CM | POA: Diagnosis not present

## 2019-03-05 DIAGNOSIS — M1990 Unspecified osteoarthritis, unspecified site: Secondary | ICD-10-CM | POA: Diagnosis not present

## 2019-03-05 DIAGNOSIS — N390 Urinary tract infection, site not specified: Secondary | ICD-10-CM | POA: Diagnosis not present

## 2019-03-05 NOTE — Patient Outreach (Signed)
[name]) Care Management  03/05/2019  [name] 02/06/1926 [id number]  Telephone Outreach (2nd attempt)  Outreach attempt #2 to pt's residence however spouse answered  the phone and was slightly confused as RN introduced and requested the pt. RN offered to contact the pt's daughter [place], again with confusion on the phone as the call ended.  RN called and spoke with the daughter [place] and introduced [facility] services along with the purpose for today's call. RN confirmed identifiers and proceeded if this was a good time to discuss the pt's issues and possible needs. RN informed the daughter that a call was made to the residence however the party that answered the phone was confused but confirmed he was the spouse. Caregiver daughter indicated it was her father (the spouse) and he has had a stroke resulting in his confusion. States her mother (the pt) will not answer the phone unless she is familiar with the incoming number. Daughter was able to confirm that the pt and the spouse both reside in an ALF ([facility]) and has all the assistance needed. States they are provide additional services such as medication administration and she takes the pt to all her medical appointments with no delays mentioned. States the pt will be obtaining a new primary provider (Dr. Ina Homes Aguiar-[phone]) with a pending appointment this week.   RN explained other preventive services offered by [facility] along with face-to-face home visits and/or telephonic disease management if needed however this offer was declined. Caregiver grateful and took note of the available contact number for this RN case manager if she felt future services would be needed or had any inquires at a later date. RN offered to update the pt's provider of pt's disposition wit [facility] services (caregiver agreed). No further request, inquires or needs at this time. Status of this case will be in-active at this  time.  [name], RN Care Management Coordinator [place] Office [phone]

## 2019-03-13 DIAGNOSIS — N390 Urinary tract infection, site not specified: Secondary | ICD-10-CM | POA: Diagnosis not present

## 2019-03-17 ENCOUNTER — Other Ambulatory Visit: Payer: Self-pay | Admitting: *Deleted

## 2019-03-17 NOTE — Patient Outreach (Signed)
HTA High Risk Screen  Talked with pt's daughter, [place]. Inquired about appropriate level of care (ALF). She feel her parents are well care for. She report all 3 recent ED visits for her mother were related to UTI that was never resolved. Pt was continued on cephalexin and after last ED visit, daughter took her mother to new primary care provider who put her on cipro and now her UTI has resolved.   Reinforced information that was provided on 03/05/19 by [name], RN, [place]. Gave daughter my number for future questions or needs.  Eulah Pont. Myrtie Neither, MSN, [facility] Gerontological Nurse Practitioner [facility] Care Management [phone]

## 2019-03-24 DIAGNOSIS — N390 Urinary tract infection, site not specified: Secondary | ICD-10-CM | POA: Diagnosis not present

## 2019-03-24 DIAGNOSIS — R319 Hematuria, unspecified: Secondary | ICD-10-CM | POA: Diagnosis not present

## 2019-03-24 DIAGNOSIS — R7612 Nonspecific reaction to cell mediated immunity measurement of gamma interferon antigen response without active tuberculosis: Secondary | ICD-10-CM | POA: Diagnosis not present

## 2019-03-31 DIAGNOSIS — R319 Hematuria, unspecified: Secondary | ICD-10-CM | POA: Diagnosis not present

## 2019-03-31 DIAGNOSIS — N390 Urinary tract infection, site not specified: Secondary | ICD-10-CM | POA: Diagnosis not present

## 2019-04-15 DIAGNOSIS — F4323 Adjustment disorder with mixed anxiety and depressed mood: Secondary | ICD-10-CM | POA: Diagnosis not present

## 2019-04-21 DIAGNOSIS — G4700 Insomnia, unspecified: Secondary | ICD-10-CM | POA: Diagnosis not present

## 2019-04-21 DIAGNOSIS — G459 Transient cerebral ischemic attack, unspecified: Secondary | ICD-10-CM | POA: Diagnosis not present

## 2019-04-21 DIAGNOSIS — R4182 Altered mental status, unspecified: Secondary | ICD-10-CM | POA: Diagnosis not present

## 2019-04-21 DIAGNOSIS — F439 Reaction to severe stress, unspecified: Secondary | ICD-10-CM | POA: Diagnosis not present

## 2019-04-21 DIAGNOSIS — G4709 Other insomnia: Secondary | ICD-10-CM | POA: Diagnosis not present

## 2019-05-12 DIAGNOSIS — G4700 Insomnia, unspecified: Secondary | ICD-10-CM | POA: Diagnosis not present

## 2019-05-12 DIAGNOSIS — E786 Lipoprotein deficiency: Secondary | ICD-10-CM | POA: Diagnosis not present

## 2019-05-12 DIAGNOSIS — H9193 Unspecified hearing loss, bilateral: Secondary | ICD-10-CM | POA: Diagnosis not present

## 2019-05-12 DIAGNOSIS — K219 Gastro-esophageal reflux disease without esophagitis: Secondary | ICD-10-CM | POA: Diagnosis not present

## 2019-05-12 DIAGNOSIS — E876 Hypokalemia: Secondary | ICD-10-CM | POA: Diagnosis not present

## 2019-05-26 DIAGNOSIS — N390 Urinary tract infection, site not specified: Secondary | ICD-10-CM | POA: Diagnosis not present

## 2019-05-26 DIAGNOSIS — R319 Hematuria, unspecified: Secondary | ICD-10-CM | POA: Diagnosis not present

## 2019-06-02 DIAGNOSIS — N952 Postmenopausal atrophic vaginitis: Secondary | ICD-10-CM | POA: Diagnosis not present

## 2019-06-02 DIAGNOSIS — R350 Frequency of micturition: Secondary | ICD-10-CM | POA: Diagnosis not present

## 2019-06-02 DIAGNOSIS — E876 Hypokalemia: Secondary | ICD-10-CM | POA: Diagnosis not present

## 2019-06-02 DIAGNOSIS — T148XXA Other injury of unspecified body region, initial encounter: Secondary | ICD-10-CM | POA: Diagnosis not present

## 2019-06-22 ENCOUNTER — Telehealth: Payer: Self-pay | Admitting: Neurology

## 2019-06-22 NOTE — Telephone Encounter (Signed)
°  Due to current COVID 19 pandemic, our office is severely reducing in office visits until further notice, in order to minimize the risk to our patients and healthcare providers.     [name] previously spoke with pt and received consent for [place] to schedule a video visit with her daughter, [name]. Per her note, pt is at an assisted living facility and the nurse called [place] and gave the phone to the pt. Pt understands that although there may be some limitations with this type of visit, we will take all precautions to reduce any security or privacy concerns.  Pt understands that this will be treated like an in office visit and we will file with pt's insurance, and there may be a patient responsible charge related to this service.   Spoke with patient's daughter [name] and scheduled a virtual visit with Dr. Leonie Man for 7/2. She said she doesn't have access to her mother's mychart as that was set up years ago and the email address linked to it is the pt's husband's email address, who has passed. [name] verbalized understanding of the [email or web address] process and I have sent an e-mail to [email or web address] with link and instructions as well as my name and our office number.

## 2019-06-24 ENCOUNTER — Ambulatory Visit: Payer: PPO | Admitting: Diagnostic Neuroimaging

## 2019-07-02 ENCOUNTER — Other Ambulatory Visit: Payer: Self-pay

## 2019-07-02 ENCOUNTER — Encounter: Payer: Self-pay | Admitting: Neurology

## 2019-07-02 ENCOUNTER — Ambulatory Visit (INDEPENDENT_AMBULATORY_CARE_PROVIDER_SITE_OTHER): Payer: PPO | Admitting: Neurology

## 2019-07-02 ENCOUNTER — Telehealth: Payer: Self-pay | Admitting: Neurology

## 2019-07-02 DIAGNOSIS — G309 Alzheimer's disease, unspecified: Secondary | ICD-10-CM | POA: Insufficient documentation

## 2019-07-02 DIAGNOSIS — R413 Other amnesia: Secondary | ICD-10-CM

## 2019-07-02 DIAGNOSIS — F0280 Dementia in other diseases classified elsewhere without behavioral disturbance: Secondary | ICD-10-CM

## 2019-07-02 DIAGNOSIS — G301 Alzheimer's disease with late onset: Secondary | ICD-10-CM

## 2019-07-02 MED ORDER — DONEPEZIL HCL 10 MG PO TABS: 10.0000 mg | ORAL_TABLET | Freq: Every day | ORAL | 2 refills | Status: DC

## 2019-07-02 NOTE — Patient Instructions (Signed)
I had a long discussion with the patient and her daughter regarding her subacute cognitive decline and memory loss likely represents late onset Alzheimer's dementia.  I recommend further evaluation by checking memory panel labs for reversible etiologies as well as EEG and MRI scan.  Start Aricept 5 mg daily for a month to be increased if tolerated to 10 mg daily.  The patient will return for in person office visit in 4 to 6 weeks or call earlier if necessary.

## 2019-07-02 NOTE — Progress Notes (Signed)
Virtual Visit via Video Note  I connected with [name] on 07/02/19 at  2:30 PM EDT by a video enabled telemedicine application and verified that I am speaking with the correct person using two identifiers.  Location: Patient : at her daughter`s home Provider: at [facility]  Ref MD : Apolinar Junes Reason for referral : dementia  I discussed the limitations of evaluation and management by telemedicine and the availability of in person appointments. The patient expressed understanding and agreed to proceed.  This visit was performed using [email or web address] app for audio and visual.  Patient's daughter was present throughout this visit and facilitated it.  History of Present Illness: Ms. Karen is a 85 year old pleasant Caucasian lady seen today for initial virtual video office consultation visit for dementia.  She is accompanied by her daughter throughout this visit who facilitates it.  History is obtained from her and review of referral notes and electronic medical records.  I personally reviewed imaging films in PACS.  Ms. Turner is a pleasant 85 year old Caucasian lady who as per the daughter has had progressive cognitive decline since January 2020.  She used to get very occasional episodes of confusion in the setting of bladder infections and disorientation.  But gradually this seem to be getting worse.  She is currently living in assisted living facility since December 2018.  Over the last several months after the lockdown from the coronavirus pandemic she is significantly worse.  She has more episodic confusion and sudden onset of behavioral change as if a light switch is flipped she may act confused and disoriented for a couple of hours and then improved and be much calmer.  She was seen in the hospital in March this year with suspected urinary tract infection and was noted as having significant sundowning at that time.  Review of lab work from March show that comprehensive metabolic panel  and CBC were normal.  CT scan of the head was obtained which I personally reviewed shows mild age-related changes of generalized atrophy and small vessel disease.  No acute abnormalities.  There are days when she is unable to recognize her daughter of the day she is fine.  She is also noticed difficulties in remembering day-to-day activities and needs increasing help from her daughter.  She has occasional headaches but these are not severe or bothersome.  She is also noticed some dizziness and imbalance she is had a few minor falls but no injuries.  There is no family stable tremors dementia.  Patient has no history of loss of consciousness, significant head injury, seizures or strokes.  She did have 2 TIAs in the remote past greater than 5 years ago but she is unable to provide any details.  She was previously on Celexa and Ativan as needed for a while but these medications have since been stopped without any improvement in her cognition or confusion. Past surgical history back surgery, hysterectomy. Family history nobody with Alzheimer's dementia or stroke. Medication allergies aspirin, Crestor, demeclocycline, nabumetone, niacin, penicillins, sulfa Home medications Plavix, multivitamin, Toprol-XL 25 mg daily, Pepcid 10 mg daily, vitamin D thousand units daily, calcium carbonate Comprehensive 14 system review of systems is positive for confusion, disorientation, dizziness, imbalance, headaches, fall, memory loss and all other systems negative   Observations/Objective: Pleasant elderly Caucasian lady not in distress.  Physical and neurological exams limited due to constraints from virtual video visit.  She is awake alert she is oriented to person place and time.  She is unable to  tell me the day of the month.  She follows simple midline and one-step commands.  Her recall is poor 0/3.  Clock drawing score is 2/4.  She is able to come up with only names of 4 animals which walk on 4 legs.  She is unable to  copy intersecting pentagons.  She is clear spatial neglect while drawing a clock or copying pentagons.  Her speech is clear without aphasia or dysarthria.  Extraocular movements are full range without nystagmus.  Face is symmetric without weakness.  Tongue is midline.  Motor system exam reveals symmetric upper and lower extremity strength without focal weakness.  She gets up with slight difficulty and walks with a slow cautious gait without any assist device.  Assessment and Plan: 85 year old pleasant Caucasian lady with subacute decline in cognition, memory and increasing confusion and disorientation likely due to Alzheimer's dementia.  Prior history of TIAs in the remote past but stable from neurovascular standpoint. I had a long discussion with the patient and her daughter regarding her subacute cognitive decline and memory loss likely represents late onset Alzheimer's dementia.  I recommend further evaluation by checking memory panel labs for reversible etiologies as well as EEG and MRI scan.  Start Aricept 5 mg daily for a month to be increased if tolerated to 10 mg daily.  The patient will return for in person office visit in 4 to 6 weeks or call earlier if necessary. Follow Up Instructions: Follow-up for return visit in the office in 6 weeks after tests are done   I discussed the assessment and treatment plan with the patient. The patient was provided an opportunity to ask questions and all were answered. The patient agreed with the plan and demonstrated an understanding of the instructions.   The patient was advised to call back or seek an in-person evaluation if the symptoms worsen or if the condition fails to improve as anticipated.  I provided 45 minutes of non-face-to-face time during this encounter.   Antony Contras, MD

## 2019-07-02 NOTE — Telephone Encounter (Signed)
Health team order sent to GI. No auth they will reach out to the patient to schedule.  

## 2019-07-06 ENCOUNTER — Other Ambulatory Visit: Payer: Self-pay

## 2019-07-06 ENCOUNTER — Telehealth: Payer: Self-pay

## 2019-07-06 MED ORDER — DONEPEZIL HCL 10 MG PO TABS: 10.0000 mg | ORAL_TABLET | Freq: Every day | ORAL | 2 refills | Status: DC

## 2019-07-06 NOTE — Telephone Encounter (Signed)
I called the daughter to update pharmacy for pt. She stated her mom is in [place] at [facility] assisted living. Whenever she takes her mom out for testing or appts she has to be quarantine for 14 days. I stated the pts aricept rx printed out. She stated all meds are handle by [facility] in [place]. I sent donepezil via electronically to [facility].

## 2019-07-06 NOTE — Telephone Encounter (Signed)
I called [facility] and spoke with staff. I gave verbal order for donepezil per Dr.Sethi. I stated it was sent electronically.

## 2019-07-20 ENCOUNTER — Telehealth: Payer: Self-pay | Admitting: Neurology

## 2019-07-20 NOTE — Telephone Encounter (Signed)
If patients daughter call back please schedule her for a EEG before 08/10/2019 if possible. Pt has a in office visit with Dr.Sethi on 08/10/2019. Pt is in assisted living facility so EEG cannot be until August 2020. 'If no appts are available it will be done after 08/10/2019. DR Leonie Man is aware of this.  Left vm for patients daughter that nursing home and assisted living pts test were on hold due to COVID 19.

## 2019-07-20 NOTE — Telephone Encounter (Signed)
Pts daughter called in and stated pt hasnt been scheduled for her EEG as they were advised by Dr Leonie Man, educated her on the policy at the moment for Assisted Living / SNF patients coming into office, she states pt hasa office visit scheduled for 08/10 and she needs her EEG before. Please advise.

## 2019-08-03 ENCOUNTER — Other Ambulatory Visit: Payer: Self-pay

## 2019-08-03 ENCOUNTER — Ambulatory Visit
Admission: RE | Admit: 2019-08-03 | Discharge: 2019-08-03 | Disposition: A | Discharge status: Home / Self Care | Payer: PPO | Source: Ambulatory Visit | Attending: Neurology | Admitting: Neurology

## 2019-08-03 DIAGNOSIS — R413 Other amnesia: Secondary | ICD-10-CM

## 2019-08-03 MED ORDER — GADOBENATE DIMEGLUMINE 529 MG/ML IV SOLN
10.0000 mL | Freq: Once | INTRAVENOUS | Status: AC | PRN
  Administered 2019-08-03: 10 mL via INTRAVENOUS

## 2019-08-05 ENCOUNTER — Other Ambulatory Visit: Payer: PPO

## 2019-08-06 ENCOUNTER — Other Ambulatory Visit (INDEPENDENT_AMBULATORY_CARE_PROVIDER_SITE_OTHER): Payer: Self-pay

## 2019-08-06 ENCOUNTER — Other Ambulatory Visit: Payer: Self-pay

## 2019-08-06 DIAGNOSIS — R413 Other amnesia: Secondary | ICD-10-CM | POA: Diagnosis not present

## 2019-08-06 DIAGNOSIS — F0280 Dementia in other diseases classified elsewhere without behavioral disturbance: Secondary | ICD-10-CM | POA: Diagnosis not present

## 2019-08-06 DIAGNOSIS — Z0289 Encounter for other administrative examinations: Secondary | ICD-10-CM

## 2019-08-06 DIAGNOSIS — G301 Alzheimer's disease with late onset: Secondary | ICD-10-CM | POA: Diagnosis not present

## 2019-08-07 LAB — DEMENTIA PANEL
Homocysteine: 9.4 umol/L (ref 0.0–21.3)
RPR Ser Ql: NONREACTIVE
TSH: 2.99 u[IU]/mL (ref 0.450–4.500)
Vitamin B-12: 395 pg/mL (ref 232–1245)

## 2019-08-09 DIAGNOSIS — S4991XA Unspecified injury of right shoulder and upper arm, initial encounter: Secondary | ICD-10-CM | POA: Diagnosis not present

## 2019-08-10 ENCOUNTER — Other Ambulatory Visit: Payer: Self-pay

## 2019-08-10 ENCOUNTER — Ambulatory Visit (INDEPENDENT_AMBULATORY_CARE_PROVIDER_SITE_OTHER): Payer: PPO | Admitting: Neurology

## 2019-08-10 ENCOUNTER — Encounter: Payer: Self-pay | Admitting: Neurology

## 2019-08-10 VITALS — BP 143/76 | HR 75 | Temp 98.0°F | Wt 120.0 lb

## 2019-08-10 DIAGNOSIS — G301 Alzheimer's disease with late onset: Secondary | ICD-10-CM | POA: Diagnosis not present

## 2019-08-10 DIAGNOSIS — F0280 Dementia in other diseases classified elsewhere without behavioral disturbance: Secondary | ICD-10-CM | POA: Diagnosis not present

## 2019-08-10 MED ORDER — MEMANTINE HCL 28 X 5 MG & 21 X 10 MG PO TABS: ORAL_TABLET | ORAL | 12 refills | Status: DC

## 2019-08-10 NOTE — Patient Instructions (Signed)
I had a long discussion with the patient and her daughter regarding her subacute cognitive decline and confusion disorientation likely representing Alzheimer's dementia.  She seems to be tolerating Aricept well and has shown some improvement.  I recommend she continue Aricept and the current dose of 10 mg daily and try Namenda starter pack.  I discussed side effects with the patient and her daughter advised him to call me if needed.  She will likely benefit from some physical and occupational therapy but does have a planned orthopedic appointment tomorrow for her fracture shoulder and will likely get this ordered.  She will return for follow-up with me in 2 months or call earlier if necessary.  Alzheimer Disease Caregiver Guide  Alzheimer disease causes a person to lose the ability to remember things and make decisions. A person who has Alzheimer disease may not be able to take care of himself or herself. He or she may need help with simple tasks. The tips below can help you care for the person. What kind of changes does this condition cause? This condition makes a person:  Forget things.  Feel confused.  Act differently.  Have different moods. These things get worse with time. Tips to help with symptoms  Be calm and patient.  Respond with a simple, short answer.  Avoid correcting the person in a negative way.  Try not to take things personally, even if the person forgets your name.  Do not argue with the person. This may make the person more upset. Tips to lessen frustration  Make appointments and do daily tasks when the person is at his or her best.  Take your time. Simple tasks may take longer. Allow plenty of time to complete tasks.  Limit choices for the person.  Involve the person in what you are doing.  Keep a daily routine.  Avoid new or crowded places, if possible.  Use simple words, short sentences, and a calm voice. Only give one direction at a time.  Buy clothes  and shoes that are easy to put on and take off.  Organize medicines in a pillbox for each day of the week.  Keep a calendar in a central location to remind the person of meetings or other activities.  Let people help if they offer. Take a break when needed. Tips to prevent injury  Keep floors clear. Remove rugs, magazine racks, and floor lamps.  Keep hallways well-lit.  Put a handrail and non-slip mat in the bathtub or shower.  Put childproof locks on cabinets that have dangerous items in them. These items include medicine, alcohol, guns, toxic cleaning items, sharp tools, matches, and lighters.  Put locks on doors where the person cannot see or reach them. This helps the person to not wander out of the house and get lost.  Be prepared for emergencies. Keep a list of emergency phone numbers and addresses close by.  Bracelets may be worn that track location and identify the person as having memory problems. This should be worn at all times for safety. Tips for the future  Discuss financial and legal planning early. People with this disease have trouble managing their money as the disease gets worse. Get help from a professional.  Talk about advance directives, safety, and daily care. Take these steps: ? Create a living will and choose a power of attorney. This is someone who can make decisions for the person with Alzheimer disease when he or she can no longer do so. ? Discuss  driving safety and when to stop driving. The person's doctor can help with this. ? If the person lives alone, make sure he or she is safe. Some people need extra help at home. Other people need more care at a nursing home or care center. Where to find support You can find support by joining a support group near you. Some benefits of joining a support group include:  Learning ways to manage stress.  Sharing experiences with others.  Getting emotional comfort and support.  Learning about caregiving as the  disease progresses.  Knowing what community resources are available and making use of them. Where to find more information  Alzheimer's Association: CapitalMile.co.nz Contact a doctor if:  The person has a fever.  The person has a sudden behavior change that does not get better with calming strategies.  The person is not able to take care of himself or herself at home.  The person threatens you or anyone else, including himself or herself.  You are no longer able to care for the person. Summary  Alzheimer disease causes a person to forget things and to be confused.  A person who has this condition may not be able to take care of himself or herself.  Take steps to keep the person from getting hurt. Plan for future care.  You can find support by joining a support group near you. This information is not intended to replace advice given to you by your health care provider. Make sure you discuss any questions you have with your health care provider. Document Released: 03/10/2012 Document Revised: 04/07/2019 Document Reviewed: 12/12/2017 Elsevier Patient Education  2020 [employer].

## 2019-08-10 NOTE — Progress Notes (Signed)
[facility] [address] [place]. [place] [address] [phone]       OFFICE FOLLOW-UP NOTE  [name] Date of Birth:  [birth year] Medical Record Number:  [id number]   HPI: Initial virtual video consultation 07/02/2019 :[name] is a 85 year old pleasant Caucasian lady seen today for initial virtual video office consultation visit for dementia.  She is accompanied by her daughter throughout this visit who facilitates it.  History is obtained from her and review of referral notes and electronic medical records.  I personally reviewed imaging films in PACS.  [name] is a pleasant 85 year old Caucasian lady who as per the daughter has had progressive cognitive decline since January 2020.  She used to get very occasional episodes of confusion in the setting of bladder infections and disorientation.  But gradually this seem to be getting worse.  She is currently living in assisted living facility since December 2018.  Over the last several months after the lockdown from the coronavirus pandemic she is significantly worse.  She has more episodic confusion and sudden onset of behavioral change as if a light switch is flipped she may act confused and disoriented for a couple of hours and then improved and be much calmer.  She was seen in the hospital in March this year with suspected urinary tract infection and was noted as having significant sundowning at that time.  Review of lab work from March show that comprehensive metabolic panel and CBC were normal.  CT scan of the head was obtained which I personally reviewed shows mild age-related changes of generalized atrophy and small vessel disease.  No acute abnormalities.  There are days when she is unable to recognize her daughter of the day she is fine.  She is also noticed difficulties in remembering day-to-day activities and needs increasing help from her daughter.  She has occasional headaches but these are not severe  or bothersome.  She is also noticed some dizziness and imbalance she is had a few minor falls but no injuries.  There is no family stable tremors dementia.  Patient has no history of loss of consciousness, significant head injury, seizures or strokes.  She did have 2 TIAs in the remote past greater than 5 years ago but she is unable to provide any details.  She was previously on Celexa and Ativan as needed for a while but these medications have since been stopped without any improvement in her cognition or confusion. Past surgical history back surgery, hysterectomy. Family history nobody with Alzheimer's dementia or stroke. Medication allergies aspirin, Crestor, demeclocycline, nabumetone, niacin, penicillins, sulfa Home medications Plavix, multivitamin, Toprol-XL 25 mg daily, Pepcid 10 mg daily, vitamin D thousand units daily, calcium carbonate Comprehensive 14 system review of systems is positive for confusion, disorientation, dizziness, imbalance, headaches, fall, memory loss and all other systems negative  Update 08/09/2009 : Patient is seen today after initial virtual video consultation visit on 07/02/2019.  She is accompanied by her daughter.  At that visit I had ordered lab work for reversible causes of memory loss which was done on 08/06/2019 and vitamin B12, TSH, RPR and homocystine were normal.  EEG was ordered but had to be rescheduled due to delays due to to coronavirus pandemic and has not yet been done.  MRI scan of the brain done on 08/03/2019 shows moderate degree of generalized cerebral atrophy and mild degree of changes of age-appropriate chronic small vessel disease.  No acute abnormalities.  Patient was started on Aricept which  she initially had some GI side effects but after a few weeks is tolerating better she is currently on 10 mg daily.  Daughter feels she is noticed slight improvement in her memory and her cognitive abilities.  She still has some good days and bad days where she gets quite  confused and disoriented.  Patient had a fall 2 days ago and has hurt her right shoulder and has suspected fracture.  She has an appointment to see orthopedic surgeon tomorrow.  Patient still living in assisted living facility and is hoping to get all her medical test done in the next few days including EEG.  She has no new complaints.  ROS:   14 system review of systems is positive for memory loss, confusion, fall, gait difficulty, shoulder pain, arthritis and all other systems negative  PMH:  Past Medical History:  Diagnosis Date  . Aortic aneurysm, thoracic (HCC)    4.4 cm  . Aortic dilatation ([place])    Seen on cath 2005  . Arthritis   . CAD (coronary artery disease)    a. Nonobstructive by last cath in 05/2004 with 30% LAD. b. EF 65-70% 12/2010. c. Hx ASA intolerance.   . Colitis    12/2010 associated with transient lower GI bleeding/anemia tx with abx  . GERD (gastroesophageal reflux disease)    a. Minimal hiatal hernia on EGD 2006, no obvious esophageal source for CP at that time.  . Hypercholesteremia    Hx of myalgias felt to be statin related but taking Crestor as OP  . Hypertension   . Osteoporosis   . Stroke [facility])    TIA    Social History:  Social History   Socioeconomic History  . Marital status: Married    Spouse name: Not on file  . Number of children: 2  . Years of education: college   . Highest education level: Not on file  Occupational History  . Occupation: retired   Scientific laboratory technician  . Financial resource strain: Not on file  . Food insecurity    Worry: Not on file    Inability: Not on file  . Transportation needs    Medical: Not on file    Non-medical: Not on file  Tobacco Use  . Smoking status: Former Smoker    Quit date: 04/26/1959    Years since quitting: 60.3  . Smokeless tobacco: Never Used  Substance and Sexual Activity  . Alcohol use: No    Alcohol/week: 1.0 standard drinks    Types: 1 Glasses of wine per week  . Drug use: No  . Sexual  activity: Never  Lifestyle  . Physical activity    Days per week: Not on file    Minutes per session: Not on file  . Stress: Not on file  Relationships  . Social Herbalist on phone: Not on file    Gets together: Not on file    Attends religious service: Not on file    Active member of club or organization: Not on file    Attends meetings of clubs or organizations: Not on file    Relationship status: Not on file  . Intimate partner violence    Fear of current or ex partner: Not on file    Emotionally abused: Not on file    Physically abused: Not on file    Forced sexual activity: Not on file  Other Topics Concern  . Not on file  Social History Narrative   Patient lives  with her husband    Patient is right handed   Patient drinks coffee daily    Medications:   Current Outpatient Medications on File Prior to Visit  Medication Sig Dispense Refill  . acetaminophen (TYLENOL) 500 MG tablet Take 500 mg by mouth 3 (three) times daily as needed for mild pain.    . Calcium Carb-Cholecalciferol (CALCIUM-VITAMIN D) 500-400 MG-UNIT TABS Take 1 tablet by mouth daily.    . cholecalciferol (VITAMIN D) 1000 UNITS tablet Take 2,000 Units by mouth daily.     . clopidogrel (PLAVIX) 75 MG tablet Take 75 mg by mouth daily.    Marland Kitchen donepezil (ARICEPT) 10 MG tablet Take 1 tablet (10 mg total) by mouth at bedtime. Start 1/2 tablet daily x 4 weeks and then 1 tablet daily 30 tablet 2  . famotidine (PEPCID) 10 MG tablet Take 20 mg by mouth 2 (two) times daily.    . hydrocortisone (ANUSOL-HC) 2.5 % rectal cream Place 1 application rectally 2 (two) times daily as needed for hemorrhoids or anal itching.    . loperamide (IMODIUM A-D) 2 MG tablet Take by mouth.    Marland Kitchen LORazepam (ATIVAN) 0.5 MG tablet Take 1 tablet (0.5 mg total) by mouth 2 (two) times daily as needed for anxiety. 10 tablet 0  . metoprolol succinate (TOPROL-XL) 25 MG 24 hr tablet Take 25 mg by mouth daily.    . Multiple Vitamin  (MULTIVITAMIN WITH MINERALS) TABS tablet Take 1 tablet by mouth daily.    . Multiple Vitamins-Minerals (CENTRUM SILVER) CHEW Chew 1 tablet by mouth daily.    Vladimir Faster Glycol-Propyl Glycol (SYSTANE OP) Apply 1-2 drops to eye 2 (two) times daily.    . polyethylene glycol powder (GLYCOLAX/MIRALAX) powder Take 17 g by mouth daily as needed for mild constipation.     No current facility-administered medications on file prior to visit.     Allergies:   Allergies  Allergen Reactions  . Aspirin Other (See Comments)    Feels like choking  . Crestor [Rosuvastatin] Other (See Comments)    Lower Extremity Myalgia Lower ext myalgia  . Bactrim Other (See Comments)    Unknown  . Demeclocycline Rash  . Niacin Other (See Comments)    Turns Face Red  . Niacin And Related Other (See Comments)    Turns red in the face  . Penicillins Other (See Comments)    Unknown Reaction Per The Patient Unknown  . Relafen [Nabumetone] Other (See Comments)    Unknown Reaction Per The Patient Unknown  . Sulfa Antibiotics Other (See Comments)    Unknown Reaction Per The Patient Unknown  . Tetracyclines & Related Rash    Physical Exam General: Frail elderly malnourished looking Caucasian lady seated, in no evident distress Head: head normocephalic and atraumatic.  Neck: supple with no carotid or supraclavicular bruits Cardiovascular: regular rate and rhythm, no murmurs Musculoskeletal: no deformity Skin:  no rash/petichiae Vascular:  Normal pulses all extremities Vitals:   08/10/19 1055  BP: (!) 143/76  Pulse: 75  Temp: 98 F (36.7 C)   Neurologic Exam Mental Status: Awake and fully alert. Oriented to place and time. Recent and remote memory poor . Attention span, concentration and fund of knowledge diminished.  Mini-Mental status exam she scored 22/30 with deficits in orientation, attention, calculation and recall.  She was able to name only 7 animals which can walk on 4 legs.  She was unable to copy  intersecting pentagons.  Clock drawing score was 3/4.  Geriatric  depression scale she scored 6 suggestive of only mild depression.  Appropriate. Mood and affect appropriate.  Cranial Nerves: Fundoscopic exam reveals sharp disc margins. Pupils equal, briskly reactive to light. Extraocular movements full without nystagmus. Visual fields full to confrontation. Hearing diminished slightly bilaterally.. Facial sensation intact. Face, tongue, palate moves normally and symmetrically.  Motor: Normal bulk and tone. Normal strength in all tested extremity muscles.  Except right shoulder elevation limited due to recent fall and fracture. Sensory.: intact to touch ,pinprick .position and vibratory sensation.  Coordination: Rapid alternating movements normal in all extremities. Finger-to-nose and heel-to-shin performed accurately bilaterally. Gait and Station: Arises from chair without difficulty. Stance is normal. Gait demonstrates normal stride length and balance . Able to heel, toe and tandem walk without difficulty.  Reflexes: 1+ and symmetric. Toes downgoing.     ASSESSMENT: 85 year old pleasant Caucasian lady with subacute decline in cognition, memory and increasing confusion and disorientation likely due to Alzheimer's dementia who has shown slight response to Aricept..  Prior history of TIAs in the remote past but stable from neurovascular standpoint    PLAN: I had a long discussion with the patient and her daughter regarding her subacute cognitive decline and confusion disorientation likely representing Alzheimer's dementia.  She seems to be tolerating Aricept well and has shown some improvement.  I recommend she continue Aricept and the current dose of 10 mg daily and try Namenda starter pack.  I discussed side effects with the patient and her daughter advised him to call me if needed.  She will likely benefit from some physical and occupational therapy but does have a planned orthopedic appointment  tomorrow for her fracture shoulder and will likely get this ordered.  May consider possible participation in the [place] dementia study if there is no significant improvement after adding Namenda at follow-up visit.  She will return for follow-up with me in 2 months or call earlier if necessary. Greater than 50% of time during this 25 minute visit was spent on counseling,explanation of diagnosis of Alzheimer`s dementia, planning of further management, discussion with patient and family and coordination of care Antony Contras, MD  [facility] [address] [place], [place] [address]  Phone [phone] Fax [phone] Note: This document was prepared with digital dictation and possible smart phrase technology. Any transcriptional errors that result from this process are unintentional

## 2019-08-11 DIAGNOSIS — M67911 Unspecified disorder of synovium and tendon, right shoulder: Secondary | ICD-10-CM | POA: Diagnosis not present

## 2019-08-13 ENCOUNTER — Other Ambulatory Visit (HOSPITAL_COMMUNITY): Payer: Self-pay | Admitting: Neurology

## 2019-08-13 ENCOUNTER — Other Ambulatory Visit (INDEPENDENT_AMBULATORY_CARE_PROVIDER_SITE_OTHER): Payer: PPO | Admitting: Neurology

## 2019-08-13 ENCOUNTER — Other Ambulatory Visit: Payer: Self-pay

## 2019-08-13 DIAGNOSIS — F0280 Dementia in other diseases classified elsewhere without behavioral disturbance: Secondary | ICD-10-CM

## 2019-08-13 DIAGNOSIS — R410 Disorientation, unspecified: Secondary | ICD-10-CM

## 2019-08-13 DIAGNOSIS — R413 Other amnesia: Secondary | ICD-10-CM

## 2019-08-15 DIAGNOSIS — M545 Low back pain: Secondary | ICD-10-CM | POA: Diagnosis not present

## 2019-08-17 DIAGNOSIS — S32030A Wedge compression fracture of third lumbar vertebra, initial encounter for closed fracture: Secondary | ICD-10-CM | POA: Diagnosis not present

## 2019-08-20 DIAGNOSIS — Z7902 Long term (current) use of antithrombotics/antiplatelets: Secondary | ICD-10-CM | POA: Diagnosis not present

## 2019-08-20 DIAGNOSIS — I712 Thoracic aortic aneurysm, without rupture: Secondary | ICD-10-CM | POA: Diagnosis not present

## 2019-08-20 DIAGNOSIS — M8008XD Age-related osteoporosis with current pathological fracture, vertebra(e), subsequent encounter for fracture with routine healing: Secondary | ICD-10-CM | POA: Diagnosis not present

## 2019-08-20 DIAGNOSIS — K529 Noninfective gastroenteritis and colitis, unspecified: Secondary | ICD-10-CM | POA: Diagnosis not present

## 2019-08-20 DIAGNOSIS — Z8673 Personal history of transient ischemic attack (TIA), and cerebral infarction without residual deficits: Secondary | ICD-10-CM | POA: Diagnosis not present

## 2019-08-20 DIAGNOSIS — G43909 Migraine, unspecified, not intractable, without status migrainosus: Secondary | ICD-10-CM | POA: Diagnosis not present

## 2019-08-20 DIAGNOSIS — I77819 Aortic ectasia, unspecified site: Secondary | ICD-10-CM | POA: Diagnosis not present

## 2019-08-20 DIAGNOSIS — M80021D Age-related osteoporosis with current pathological fracture, right humerus, subsequent encounter for fracture with routine healing: Secondary | ICD-10-CM | POA: Diagnosis not present

## 2019-08-20 DIAGNOSIS — F0281 Dementia in other diseases classified elsewhere with behavioral disturbance: Secondary | ICD-10-CM | POA: Diagnosis not present

## 2019-08-20 DIAGNOSIS — Z87891 Personal history of nicotine dependence: Secondary | ICD-10-CM | POA: Diagnosis not present

## 2019-08-20 DIAGNOSIS — I10 Essential (primary) hypertension: Secondary | ICD-10-CM | POA: Diagnosis not present

## 2019-08-20 DIAGNOSIS — Z87440 Personal history of urinary (tract) infections: Secondary | ICD-10-CM | POA: Diagnosis not present

## 2019-08-20 DIAGNOSIS — Z9181 History of falling: Secondary | ICD-10-CM | POA: Diagnosis not present

## 2019-08-20 DIAGNOSIS — G301 Alzheimer's disease with late onset: Secondary | ICD-10-CM | POA: Diagnosis not present

## 2019-08-20 DIAGNOSIS — M1990 Unspecified osteoarthritis, unspecified site: Secondary | ICD-10-CM | POA: Diagnosis not present

## 2019-08-20 DIAGNOSIS — K219 Gastro-esophageal reflux disease without esophagitis: Secondary | ICD-10-CM | POA: Diagnosis not present

## 2019-08-20 DIAGNOSIS — I2510 Atherosclerotic heart disease of native coronary artery without angina pectoris: Secondary | ICD-10-CM | POA: Diagnosis not present

## 2019-09-01 DIAGNOSIS — M545 Low back pain: Secondary | ICD-10-CM | POA: Diagnosis not present

## 2019-09-02 DIAGNOSIS — Z87440 Personal history of urinary (tract) infections: Secondary | ICD-10-CM | POA: Diagnosis not present

## 2019-09-02 DIAGNOSIS — Z87891 Personal history of nicotine dependence: Secondary | ICD-10-CM | POA: Diagnosis not present

## 2019-09-02 DIAGNOSIS — M80021D Age-related osteoporosis with current pathological fracture, right humerus, subsequent encounter for fracture with routine healing: Secondary | ICD-10-CM | POA: Diagnosis not present

## 2019-09-02 DIAGNOSIS — I2510 Atherosclerotic heart disease of native coronary artery without angina pectoris: Secondary | ICD-10-CM | POA: Diagnosis not present

## 2019-09-02 DIAGNOSIS — M1990 Unspecified osteoarthritis, unspecified site: Secondary | ICD-10-CM | POA: Diagnosis not present

## 2019-09-02 DIAGNOSIS — I712 Thoracic aortic aneurysm, without rupture: Secondary | ICD-10-CM | POA: Diagnosis not present

## 2019-09-02 DIAGNOSIS — Z8673 Personal history of transient ischemic attack (TIA), and cerebral infarction without residual deficits: Secondary | ICD-10-CM | POA: Diagnosis not present

## 2019-09-02 DIAGNOSIS — Z9181 History of falling: Secondary | ICD-10-CM | POA: Diagnosis not present

## 2019-09-02 DIAGNOSIS — M8008XD Age-related osteoporosis with current pathological fracture, vertebra(e), subsequent encounter for fracture with routine healing: Secondary | ICD-10-CM | POA: Diagnosis not present

## 2019-09-02 DIAGNOSIS — I77819 Aortic ectasia, unspecified site: Secondary | ICD-10-CM | POA: Diagnosis not present

## 2019-09-02 DIAGNOSIS — K219 Gastro-esophageal reflux disease without esophagitis: Secondary | ICD-10-CM | POA: Diagnosis not present

## 2019-09-02 DIAGNOSIS — F0281 Dementia in other diseases classified elsewhere with behavioral disturbance: Secondary | ICD-10-CM | POA: Diagnosis not present

## 2019-09-02 DIAGNOSIS — G43909 Migraine, unspecified, not intractable, without status migrainosus: Secondary | ICD-10-CM | POA: Diagnosis not present

## 2019-09-02 DIAGNOSIS — Z7902 Long term (current) use of antithrombotics/antiplatelets: Secondary | ICD-10-CM | POA: Diagnosis not present

## 2019-09-02 DIAGNOSIS — I10 Essential (primary) hypertension: Secondary | ICD-10-CM | POA: Diagnosis not present

## 2019-09-02 DIAGNOSIS — G301 Alzheimer's disease with late onset: Secondary | ICD-10-CM | POA: Diagnosis not present

## 2019-09-02 DIAGNOSIS — K529 Noninfective gastroenteritis and colitis, unspecified: Secondary | ICD-10-CM | POA: Diagnosis not present

## 2019-09-03 DIAGNOSIS — Z03818 Encounter for observation for suspected exposure to other biological agents ruled out: Secondary | ICD-10-CM | POA: Diagnosis not present

## 2019-09-21 DIAGNOSIS — Z87440 Personal history of urinary (tract) infections: Secondary | ICD-10-CM | POA: Diagnosis not present

## 2019-09-21 DIAGNOSIS — I77819 Aortic ectasia, unspecified site: Secondary | ICD-10-CM | POA: Diagnosis not present

## 2019-09-21 DIAGNOSIS — Z9181 History of falling: Secondary | ICD-10-CM | POA: Diagnosis not present

## 2019-09-21 DIAGNOSIS — G301 Alzheimer's disease with late onset: Secondary | ICD-10-CM | POA: Diagnosis not present

## 2019-09-21 DIAGNOSIS — Z7902 Long term (current) use of antithrombotics/antiplatelets: Secondary | ICD-10-CM | POA: Diagnosis not present

## 2019-09-21 DIAGNOSIS — M8008XD Age-related osteoporosis with current pathological fracture, vertebra(e), subsequent encounter for fracture with routine healing: Secondary | ICD-10-CM | POA: Diagnosis not present

## 2019-09-21 DIAGNOSIS — F0281 Dementia in other diseases classified elsewhere with behavioral disturbance: Secondary | ICD-10-CM | POA: Diagnosis not present

## 2019-09-21 DIAGNOSIS — K529 Noninfective gastroenteritis and colitis, unspecified: Secondary | ICD-10-CM | POA: Diagnosis not present

## 2019-09-21 DIAGNOSIS — G43909 Migraine, unspecified, not intractable, without status migrainosus: Secondary | ICD-10-CM | POA: Diagnosis not present

## 2019-09-21 DIAGNOSIS — Z87891 Personal history of nicotine dependence: Secondary | ICD-10-CM | POA: Diagnosis not present

## 2019-09-21 DIAGNOSIS — K219 Gastro-esophageal reflux disease without esophagitis: Secondary | ICD-10-CM | POA: Diagnosis not present

## 2019-09-21 DIAGNOSIS — I2510 Atherosclerotic heart disease of native coronary artery without angina pectoris: Secondary | ICD-10-CM | POA: Diagnosis not present

## 2019-09-21 DIAGNOSIS — M1990 Unspecified osteoarthritis, unspecified site: Secondary | ICD-10-CM | POA: Diagnosis not present

## 2019-09-21 DIAGNOSIS — I10 Essential (primary) hypertension: Secondary | ICD-10-CM | POA: Diagnosis not present

## 2019-09-21 DIAGNOSIS — I712 Thoracic aortic aneurysm, without rupture: Secondary | ICD-10-CM | POA: Diagnosis not present

## 2019-09-21 DIAGNOSIS — M80021D Age-related osteoporosis with current pathological fracture, right humerus, subsequent encounter for fracture with routine healing: Secondary | ICD-10-CM | POA: Diagnosis not present

## 2019-09-21 DIAGNOSIS — Z8673 Personal history of transient ischemic attack (TIA), and cerebral infarction without residual deficits: Secondary | ICD-10-CM | POA: Diagnosis not present

## 2019-10-05 DIAGNOSIS — F0281 Dementia in other diseases classified elsewhere with behavioral disturbance: Secondary | ICD-10-CM | POA: Diagnosis not present

## 2019-10-05 DIAGNOSIS — M1990 Unspecified osteoarthritis, unspecified site: Secondary | ICD-10-CM | POA: Diagnosis not present

## 2019-10-05 DIAGNOSIS — I10 Essential (primary) hypertension: Secondary | ICD-10-CM | POA: Diagnosis not present

## 2019-10-05 DIAGNOSIS — K219 Gastro-esophageal reflux disease without esophagitis: Secondary | ICD-10-CM | POA: Diagnosis not present

## 2019-10-05 DIAGNOSIS — I77819 Aortic ectasia, unspecified site: Secondary | ICD-10-CM | POA: Diagnosis not present

## 2019-10-05 DIAGNOSIS — Z87891 Personal history of nicotine dependence: Secondary | ICD-10-CM | POA: Diagnosis not present

## 2019-10-05 DIAGNOSIS — G301 Alzheimer's disease with late onset: Secondary | ICD-10-CM | POA: Diagnosis not present

## 2019-10-05 DIAGNOSIS — Z8673 Personal history of transient ischemic attack (TIA), and cerebral infarction without residual deficits: Secondary | ICD-10-CM | POA: Diagnosis not present

## 2019-10-05 DIAGNOSIS — Z9181 History of falling: Secondary | ICD-10-CM | POA: Diagnosis not present

## 2019-10-05 DIAGNOSIS — I2510 Atherosclerotic heart disease of native coronary artery without angina pectoris: Secondary | ICD-10-CM | POA: Diagnosis not present

## 2019-10-05 DIAGNOSIS — M80021D Age-related osteoporosis with current pathological fracture, right humerus, subsequent encounter for fracture with routine healing: Secondary | ICD-10-CM | POA: Diagnosis not present

## 2019-10-05 DIAGNOSIS — M8008XD Age-related osteoporosis with current pathological fracture, vertebra(e), subsequent encounter for fracture with routine healing: Secondary | ICD-10-CM | POA: Diagnosis not present

## 2019-10-05 DIAGNOSIS — K529 Noninfective gastroenteritis and colitis, unspecified: Secondary | ICD-10-CM | POA: Diagnosis not present

## 2019-10-05 DIAGNOSIS — Z87440 Personal history of urinary (tract) infections: Secondary | ICD-10-CM | POA: Diagnosis not present

## 2019-10-05 DIAGNOSIS — I712 Thoracic aortic aneurysm, without rupture: Secondary | ICD-10-CM | POA: Diagnosis not present

## 2019-10-05 DIAGNOSIS — G43909 Migraine, unspecified, not intractable, without status migrainosus: Secondary | ICD-10-CM | POA: Diagnosis not present

## 2019-10-05 DIAGNOSIS — Z7902 Long term (current) use of antithrombotics/antiplatelets: Secondary | ICD-10-CM | POA: Diagnosis not present

## 2019-10-19 ENCOUNTER — Other Ambulatory Visit: Payer: Self-pay

## 2019-10-19 ENCOUNTER — Ambulatory Visit (INDEPENDENT_AMBULATORY_CARE_PROVIDER_SITE_OTHER): Payer: PPO | Admitting: Neurology

## 2019-10-19 ENCOUNTER — Encounter: Payer: Self-pay | Admitting: Neurology

## 2019-10-19 VITALS — BP 136/89 | HR 81 | Temp 97.6°F | Wt 125.0 lb

## 2019-10-19 DIAGNOSIS — G301 Alzheimer's disease with late onset: Secondary | ICD-10-CM | POA: Diagnosis not present

## 2019-10-19 DIAGNOSIS — F0280 Dementia in other diseases classified elsewhere without behavioral disturbance: Secondary | ICD-10-CM | POA: Diagnosis not present

## 2019-10-19 NOTE — Patient Instructions (Signed)
I had a long discussion with the patient and her daughter regarding her mild dementia which appears to have shown some improvement on Aricept and Namenda which she seems to be tolerating well without side effects.  I recommend she continue the current doses of both medications.  She was encouraged to use a walker while walking at all times and we discussed fall and safety precautions.  She will return for follow-up in the future in 6 months with my nurse practitioner Janett Billow or call earlier if necessary.  Fall Prevention in the Home, Adult Falls can cause injuries. They can happen to people of all ages. There are many things you can do to make your home safe and to help prevent falls. Ask for help when making these changes, if needed. What actions can I take to prevent falls? General Instructions  Use good lighting in all rooms. Replace any light bulbs that burn out.  Turn on the lights when you go into a dark area. Use night-lights.  Keep items that you use often in easy-to-reach places. Lower the shelves around your home if necessary.  Set up your furniture so you have a clear path. Avoid moving your furniture around.  Do not have throw rugs and other things on the floor that can make you trip.  Avoid walking on wet floors.  If any of your floors are uneven, fix them.  Add color or contrast paint or tape to clearly mark and help you see: ? Any grab bars or handrails. ? First and last steps of stairways. ? Where the edge of each step is.  If you use a stepladder: ? Make sure that it is fully opened. Do not climb a closed stepladder. ? Make sure that both sides of the stepladder are locked into place. ? Ask someone to hold the stepladder for you while you use it.  If there are any pets around you, be aware of where they are. What can I do in the bathroom?      Keep the floor dry. Clean up any water that spills onto the floor as soon as it happens.  Remove soap buildup in the  tub or shower regularly.  Use non-skid mats or decals on the floor of the tub or shower.  Attach bath mats securely with double-sided, non-slip rug tape.  If you need to sit down in the shower, use a plastic, non-slip stool.  Install grab bars by the toilet and in the tub and shower. Do not use towel bars as grab bars. What can I do in the bedroom?  Make sure that you have a light by your bed that is easy to reach.  Do not use any sheets or blankets that are too big for your bed. They should not hang down onto the floor.  Have a firm chair that has side arms. You can use this for support while you get dressed. What can I do in the kitchen?  Clean up any spills right away.  If you need to reach something above you, use a strong step stool that has a grab bar.  Keep electrical cords out of the way.  Do not use floor polish or wax that makes floors slippery. If you must use wax, use non-skid floor wax. What can I do with my stairs?  Do not leave any items on the stairs.  Make sure that you have a light switch at the top of the stairs and the bottom of the stairs.  If you do not have them, ask someone to add them for you.  Make sure that there are handrails on both sides of the stairs, and use them. Fix handrails that are broken or loose. Make sure that handrails are as long as the stairways.  Install non-slip stair treads on all stairs in your home.  Avoid having throw rugs at the top or bottom of the stairs. If you do have throw rugs, attach them to the floor with carpet tape.  Choose a carpet that does not hide the edge of the steps on the stairway.  Check any carpeting to make sure that it is firmly attached to the stairs. Fix any carpet that is loose or worn. What can I do on the outside of my home?  Use bright outdoor lighting.  Regularly fix the edges of walkways and driveways and fix any cracks.  Remove anything that might make you trip as you walk through a door,  such as a raised step or threshold.  Trim any bushes or trees on the path to your home.  Regularly check to see if handrails are loose or broken. Make sure that both sides of any steps have handrails.  Install guardrails along the edges of any raised decks and porches.  Clear walking paths of anything that might make someone trip, such as tools or rocks.  Have any leaves, snow, or ice cleared regularly.  Use sand or salt on walking paths during winter.  Clean up any spills in your garage right away. This includes grease or oil spills. What other actions can I take?  Wear shoes that: ? Have a low heel. Do not wear high heels. ? Have rubber bottoms. ? Are comfortable and fit you well. ? Are closed at the toe. Do not wear open-toe sandals.  Use tools that help you move around (mobility aids) if they are needed. These include: ? Canes. ? Walkers. ? Scooters. ? Crutches.  Review your medicines with your doctor. Some medicines can make you feel dizzy. This can increase your chance of falling. Ask your doctor what other things you can do to help prevent falls. Where to find more information  Centers for Disease Control and Prevention, [employer]AD[employer]: https://garcia.biz/  [employer] on Aging: BrainJudge.co.uk Contact a doctor if:  You are afraid of falling at home.  You feel weak, drowsy, or dizzy at home.  You fall at home. Summary  There are many simple things that you can do to make your home safe and to help prevent falls.  Ways to make your home safe include removing tripping hazards and installing grab bars in the bathroom.  Ask for help when making these changes in your home. This information is not intended to replace advice given to you by your health care provider. Make sure you discuss any questions you have with your health care provider. Document Released: 10/13/2009 Document Revised: 04/09/2019 Document Reviewed: 08/01/2017 Elsevier Patient Education   2020 [employer].

## 2019-10-19 NOTE — Progress Notes (Signed)
[facility] [address] [place]. [place] [address] [phone]       OFFICE FOLLOW-UP NOTE  [name] Date of Birth:  [birth year] Medical Record Number:  [id number]   HPI: Initial virtual video consultation 07/02/2019 :Ms. Ransier is a 85 year old pleasant Caucasian lady seen today for initial virtual video office consultation visit for dementia.  She is accompanied by her daughter throughout this visit who facilitates it.  History is obtained from her and review of referral notes and electronic medical records.  I personally reviewed imaging films in PACS.  [name] is a pleasant 85 year old Caucasian lady who as per the daughter has had progressive cognitive decline since January 2020.  She used to get very occasional episodes of confusion in the setting of bladder infections and disorientation.  But gradually this seem to be getting worse.  She is currently living in assisted living facility since December 2018.  Over the last several months after the lockdown from the coronavirus pandemic she is significantly worse.  She has more episodic confusion and sudden onset of behavioral change as if a light switch is flipped she may act confused and disoriented for a couple of hours and then improved and be much calmer.  She was seen in the hospital in March this year with suspected urinary tract infection and was noted as having significant sundowning at that time.  Review of lab work from March show that comprehensive metabolic panel and CBC were normal.  CT scan of the head was obtained which I personally reviewed shows mild age-related changes of generalized atrophy and small vessel disease.  No acute abnormalities.  There are days when she is unable to recognize her daughter of the day she is fine.  She is also noticed difficulties in remembering day-to-day activities and needs increasing help from her daughter.  She has occasional headaches but these are not severe  or bothersome.  She is also noticed some dizziness and imbalance she is had a few minor falls but no injuries.  There is no family stable tremors dementia.  Patient has no history of loss of consciousness, significant head injury, seizures or strokes.  She did have 2 TIAs in the remote past greater than 5 years ago but she is unable to provide any details.  She was previously on Celexa and Ativan as needed for a while but these medications have since been stopped without any improvement in her cognition or confusion. Past surgical history back surgery, hysterectomy. Family history nobody with Alzheimer's dementia or stroke. Medication allergies aspirin, Crestor, demeclocycline, nabumetone, niacin, penicillins, sulfa Home medications Plavix, multivitamin, Toprol-XL 25 mg daily, Pepcid 10 mg daily, vitamin D thousand units daily, calcium carbonate Comprehensive 14 system review of systems is positive for confusion, disorientation, dizziness, imbalance, headaches, fall, memory loss and all other systems negative  Update 08/09/2009 : Patient is seen today after initial virtual video consultation visit on 07/02/2019.  She is accompanied by her daughter.  At that visit I had ordered lab work for reversible causes of memory loss which was done on 08/06/2019 and vitamin B12, TSH, RPR and homocystine were normal.  EEG was ordered but had to be rescheduled due to delays due to to coronavirus pandemic and has not yet been done.  MRI scan of the brain done on 08/03/2019 shows moderate degree of generalized cerebral atrophy and mild degree of changes of age-appropriate chronic small vessel disease.  No acute abnormalities.  Patient was started on Aricept which  she initially had some GI side effects but after a few weeks is tolerating better she is currently on 10 mg daily.  Daughter feels she is noticed slight improvement in her memory and her cognitive abilities.  She still has some good days and bad days where she gets quite  confused and disoriented.  Patient had a fall 2 days ago and has hurt her right shoulder and has suspected fracture.  She has an appointment to see orthopedic surgeon tomorrow.  Patient still living in assisted living facility and is hoping to get all her medical test done in the next few days including EEG.  She has no new complaints. Update 10/19/2019: She returns for follow-up after last visit 2 months ago.  She is accompanied by her daughter.  She has been able to tolerate Namenda and Aricept well without significant side effects.  They have noticed some improvement in her cognition and memory.  She is not confused as much.  She is mostly independent and does most activities of daily living for herself.  She is more oriented to time and not as disoriented.  She is able to dress herself and participate in group activities as well as therapies and exercise class.  She has benefited with physical and occupational therapy and is now able to ambulate with a walker.  She walks several times a day and has had no interim falls.  She has no new complaints today.  She does not have any delusions, hallucinations or unsafe behavior. ROS:   14 system review of systems is positive for memory loss, confusion,  , gait difficulty, shoulder pain, arthritis and all other systems negative  PMH:  Past Medical History:  Diagnosis Date  . Aortic aneurysm, thoracic (HCC)    4.4 cm  . Aortic dilatation ([place])    Seen on cath 2005  . Arthritis   . CAD (coronary artery disease)    a. Nonobstructive by last cath in 05/2004 with 30% LAD. b. EF 65-70% 12/2010. c. Hx ASA intolerance.   . Colitis    12/2010 associated with transient lower GI bleeding/anemia tx with abx  . GERD (gastroesophageal reflux disease)    a. Minimal hiatal hernia on EGD 2006, no obvious esophageal source for CP at that time.  . Hypercholesteremia    Hx of myalgias felt to be statin related but taking Crestor as OP  . Hypertension   . Osteoporosis   .  Stroke [facility])    TIA    Social History:  Social History   Socioeconomic History  . Marital status: Married    Spouse name: Not on file  . Number of children: 2  . Years of education: college   . Highest education level: Not on file  Occupational History  . Occupation: retired   Scientific laboratory technician  . Financial resource strain: Not on file  . Food insecurity    Worry: Not on file    Inability: Not on file  . Transportation needs    Medical: Not on file    Non-medical: Not on file  Tobacco Use  . Smoking status: Former Smoker    Quit date: 04/26/1959    Years since quitting: 60.5  . Smokeless tobacco: Never Used  Substance and Sexual Activity  . Alcohol use: No    Alcohol/week: 1.0 standard drinks    Types: 1 Glasses of wine per week  . Drug use: No  . Sexual activity: Never  Lifestyle  . Physical activity  Days per week: Not on file    Minutes per session: Not on file  . Stress: Not on file  Relationships  . Social Herbalist on phone: Not on file    Gets together: Not on file    Attends religious service: Not on file    Active member of club or organization: Not on file    Attends meetings of clubs or organizations: Not on file    Relationship status: Not on file  . Intimate partner violence    Fear of current or ex partner: Not on file    Emotionally abused: Not on file    Physically abused: Not on file    Forced sexual activity: Not on file  Other Topics Concern  . Not on file  Social History Narrative   Patient lives with her husband    Patient is right handed   Patient drinks coffee daily    Medications:   Current Outpatient Medications on File Prior to Visit  Medication Sig Dispense Refill  . acetaminophen (TYLENOL) 500 MG tablet Take 500 mg by mouth 3 (three) times daily as needed for mild pain.    . Calcium Carb-Cholecalciferol (CALCIUM-VITAMIN D) 500-400 MG-UNIT TABS Take 1 tablet by mouth daily.    . cholecalciferol (VITAMIN D) 1000 UNITS  tablet Take 2,000 Units by mouth daily.     . clopidogrel (PLAVIX) 75 MG tablet Take 75 mg by mouth daily.    Marland Kitchen donepezil (ARICEPT) 10 MG tablet Take 1 tablet (10 mg total) by mouth at bedtime. Start 1/2 tablet daily x 4 weeks and then 1 tablet daily 30 tablet 2  . famotidine (PEPCID) 10 MG tablet Take 20 mg by mouth 2 (two) times daily.    . hydrocortisone (ANUSOL-HC) 2.5 % rectal cream Place 1 application rectally 2 (two) times daily as needed for hemorrhoids or anal itching.    . loperamide (IMODIUM) 2 MG capsule     . LORazepam (ATIVAN) 0.5 MG tablet Take 1 tablet (0.5 mg total) by mouth 2 (two) times daily as needed for anxiety. 10 tablet 0  . memantine (NAMENDA) 10 MG tablet     . methocarbamol (ROBAXIN) 500 MG tablet TK 1 T PO EVERY 6 TO 8 HOURS PRN FOR SPASMS/MUSCLE TENSION    . metoprolol succinate (TOPROL-XL) 25 MG 24 hr tablet Take 25 mg by mouth daily.    . Multiple Vitamin (MULTIVITAMIN WITH MINERALS) TABS tablet Take 1 tablet by mouth daily.    . Multiple Vitamins-Minerals (CENTRUM SILVER) CHEW Chew 1 tablet by mouth daily.    Vladimir Faster Glycol-Propyl Glycol (SYSTANE OP) Apply 1-2 drops to eye 2 (two) times daily.    . polyethylene glycol powder (GLYCOLAX/MIRALAX) powder Take 17 g by mouth daily as needed for mild constipation.    Marland Kitchen PROCTOZONE-HC 2.5 % rectal cream     . traMADol (ULTRAM) 50 MG tablet      No current facility-administered medications on file prior to visit.     Allergies:   Allergies  Allergen Reactions  . Aspirin Other (See Comments)    Feels like choking  . Crestor [Rosuvastatin] Other (See Comments)    Lower Extremity Myalgia Lower ext myalgia  . Bactrim Other (See Comments)    Unknown  . Demeclocycline Rash  . Niacin Other (See Comments)    Turns Face Red  . Niacin And Related Other (See Comments)    Turns red in the face  . Penicillins Other (See  Comments)    Unknown Reaction Per The Patient Unknown  . Relafen [Nabumetone] Other (See  Comments)    Unknown Reaction Per The Patient Unknown  . Sulfa Antibiotics Other (See Comments)    Unknown Reaction Per The Patient Unknown  . Tetracyclines & Related Rash    Physical Exam General: Frail elderly malnourished looking Caucasian lady seated, in no evident distress Head: head normocephalic and atraumatic.  Neck: supple with no carotid or supraclavicular bruits Cardiovascular: regular rate and rhythm, no murmurs Musculoskeletal: no deformity Skin:  no rash/petichiae Vascular:  Normal pulses all extremities Vitals:   10/19/19 1124  BP: 136/89  Pulse: 81  Temp: 97.6 F (36.4 C)   Neurologic Exam Mental Status: Awake and fully alert. Oriented to place and time. Recent and remote memory poor . Attention span, concentration and fund of knowledge diminished.  Mini-Mental status exam she scored 24/30 ( last visit 22/30) with deficits in orientation, attention, calculation and recall.  She was able to name  10 animals which can walk on 4 legs.  She was able to copy intersecting pentagons.  Clock drawing score was 3/4.  Geriatric depression scale not done  Appropriate. Mood and affect appropriate.  Cranial Nerves: Fundoscopic exam reveals sharp disc margins. Pupils equal, briskly reactive to light. Extraocular movements full without nystagmus. Visual fields full to confrontation. Hearing diminished slightly bilaterally.. Facial sensation intact. Face, tongue, palate moves normally and symmetrically.  Motor: Normal bulk and tone. Normal strength in all tested extremity muscles.  Except right shoulder elevation limited due to recent fall and fracture. Sensory.: intact to touch ,pinprick .position and vibratory sensation.  Coordination: Rapid alternating movements normal in all extremities. Finger-to-nose and heel-to-shin performed accurately bilaterally. Gait and Station: Arises from chair without difficulty. Stance is normal. Gait demonstrates normal stride length and balance . Able  to heel, toe and tandem walk without difficulty.  Reflexes: 1+ and symmetric. Toes downgoing.     ASSESSMENT: 85 year old pleasant Caucasian lady with subacute decline in cognition, memory and increasing confusion and disorientation likely due to Alzheimer's dementia who has shown slight response to Aricept.. Prior history of TIAs in the remote past but stable from neurovascular standpoint    PLAN: I had a long discussion with the patient and her daughter regarding her mild dementia which appears to have shown some improvement on Aricept and Namenda which she seems to be tolerating well without side effects.  I recommend she continue the current doses of both medications.  She was encouraged to use a walker while walking at all times and we discussed fall and safety precautions.  She will return for follow-up in the future in 6 months with my nurse practitioner Janett Billow or call earlier if necessary. Greater than 50% of time during this 25 minute visit was spent on counseling,explanation of diagnosis of Alzheimer`s dementia, planning of further management, discussion with patient and family and coordination of care Antony Contras, MD  [facility] [address] [place], [place] [address]  Phone [phone] Fax [phone] Note: This document was prepared with digital dictation and possible smart phrase technology. Any transcriptional errors that result from this process are unintentional

## 2019-12-01 DIAGNOSIS — Z03818 Encounter for observation for suspected exposure to other biological agents ruled out: Secondary | ICD-10-CM | POA: Diagnosis not present

## 2019-12-14 DIAGNOSIS — Z03818 Encounter for observation for suspected exposure to other biological agents ruled out: Secondary | ICD-10-CM | POA: Diagnosis not present

## 2019-12-21 DIAGNOSIS — Z03818 Encounter for observation for suspected exposure to other biological agents ruled out: Secondary | ICD-10-CM | POA: Diagnosis not present

## 2019-12-29 DIAGNOSIS — Z03818 Encounter for observation for suspected exposure to other biological agents ruled out: Secondary | ICD-10-CM | POA: Diagnosis not present

## 2020-01-03 DIAGNOSIS — K921 Melena: Secondary | ICD-10-CM | POA: Diagnosis not present

## 2020-01-03 DIAGNOSIS — K529 Noninfective gastroenteritis and colitis, unspecified: Secondary | ICD-10-CM | POA: Diagnosis not present

## 2020-01-10 ENCOUNTER — Other Ambulatory Visit: Payer: Self-pay

## 2020-01-10 ENCOUNTER — Inpatient Hospital Stay (HOSPITAL_COMMUNITY)
Admission: EM | Admit: 2020-01-10 | Discharge: 2020-01-14 | DRG: 871 | Disposition: A | Discharge status: Home Health | Payer: PPO | Source: Skilled Nursing Facility | Attending: Internal Medicine | Admitting: Internal Medicine

## 2020-01-10 ENCOUNTER — Emergency Department (HOSPITAL_COMMUNITY): Payer: PPO

## 2020-01-10 DIAGNOSIS — A419 Sepsis, unspecified organism: Secondary | ICD-10-CM | POA: Diagnosis not present

## 2020-01-10 DIAGNOSIS — G309 Alzheimer's disease, unspecified: Secondary | ICD-10-CM | POA: Diagnosis not present

## 2020-01-10 DIAGNOSIS — J189 Pneumonia, unspecified organism: Secondary | ICD-10-CM | POA: Diagnosis present

## 2020-01-10 DIAGNOSIS — F0390 Unspecified dementia without behavioral disturbance: Secondary | ICD-10-CM | POA: Diagnosis not present

## 2020-01-10 DIAGNOSIS — Z8249 Family history of ischemic heart disease and other diseases of the circulatory system: Secondary | ICD-10-CM | POA: Diagnosis not present

## 2020-01-10 DIAGNOSIS — Z7902 Long term (current) use of antithrombotics/antiplatelets: Secondary | ICD-10-CM

## 2020-01-10 DIAGNOSIS — R0902 Hypoxemia: Secondary | ICD-10-CM | POA: Diagnosis present

## 2020-01-10 DIAGNOSIS — Z8673 Personal history of transient ischemic attack (TIA), and cerebral infarction without residual deficits: Secondary | ICD-10-CM

## 2020-01-10 DIAGNOSIS — G301 Alzheimer's disease with late onset: Secondary | ICD-10-CM | POA: Diagnosis not present

## 2020-01-10 DIAGNOSIS — G9341 Metabolic encephalopathy: Secondary | ICD-10-CM | POA: Diagnosis not present

## 2020-01-10 DIAGNOSIS — I959 Hypotension, unspecified: Secondary | ICD-10-CM | POA: Diagnosis not present

## 2020-01-10 DIAGNOSIS — E876 Hypokalemia: Secondary | ICD-10-CM | POA: Diagnosis present

## 2020-01-10 DIAGNOSIS — Z886 Allergy status to analgesic agent status: Secondary | ICD-10-CM

## 2020-01-10 DIAGNOSIS — R410 Disorientation, unspecified: Secondary | ICD-10-CM | POA: Diagnosis not present

## 2020-01-10 DIAGNOSIS — Z79891 Long term (current) use of opiate analgesic: Secondary | ICD-10-CM | POA: Diagnosis not present

## 2020-01-10 DIAGNOSIS — M8088XA Other osteoporosis with current pathological fracture, vertebra(e), initial encounter for fracture: Secondary | ICD-10-CM | POA: Diagnosis present

## 2020-01-10 DIAGNOSIS — M4854XA Collapsed vertebra, not elsewhere classified, thoracic region, initial encounter for fracture: Secondary | ICD-10-CM | POA: Diagnosis not present

## 2020-01-10 DIAGNOSIS — R011 Cardiac murmur, unspecified: Secondary | ICD-10-CM | POA: Diagnosis not present

## 2020-01-10 DIAGNOSIS — A4189 Other specified sepsis: Secondary | ICD-10-CM | POA: Diagnosis not present

## 2020-01-10 DIAGNOSIS — I2510 Atherosclerotic heart disease of native coronary artery without angina pectoris: Secondary | ICD-10-CM | POA: Diagnosis not present

## 2020-01-10 DIAGNOSIS — Z20822 Contact with and (suspected) exposure to covid-19: Secondary | ICD-10-CM | POA: Diagnosis present

## 2020-01-10 DIAGNOSIS — R6510 Systemic inflammatory response syndrome (SIRS) of non-infectious origin without acute organ dysfunction: Secondary | ICD-10-CM | POA: Diagnosis not present

## 2020-01-10 DIAGNOSIS — Y95 Nosocomial condition: Secondary | ICD-10-CM | POA: Diagnosis present

## 2020-01-10 DIAGNOSIS — R7881 Bacteremia: Secondary | ICD-10-CM | POA: Diagnosis not present

## 2020-01-10 DIAGNOSIS — D7589 Other specified diseases of blood and blood-forming organs: Secondary | ICD-10-CM | POA: Diagnosis present

## 2020-01-10 DIAGNOSIS — R509 Fever, unspecified: Secondary | ICD-10-CM | POA: Diagnosis not present

## 2020-01-10 DIAGNOSIS — K219 Gastro-esophageal reflux disease without esophagitis: Secondary | ICD-10-CM | POA: Diagnosis present

## 2020-01-10 DIAGNOSIS — R0689 Other abnormalities of breathing: Secondary | ICD-10-CM | POA: Diagnosis not present

## 2020-01-10 DIAGNOSIS — I712 Thoracic aortic aneurysm, without rupture, unspecified: Secondary | ICD-10-CM | POA: Diagnosis present

## 2020-01-10 DIAGNOSIS — Z881 Allergy status to other antibiotic agents status: Secondary | ICD-10-CM

## 2020-01-10 DIAGNOSIS — I10 Essential (primary) hypertension: Secondary | ICD-10-CM | POA: Diagnosis present

## 2020-01-10 DIAGNOSIS — Z87891 Personal history of nicotine dependence: Secondary | ICD-10-CM | POA: Diagnosis not present

## 2020-01-10 DIAGNOSIS — G4489 Other headache syndrome: Secondary | ICD-10-CM | POA: Diagnosis not present

## 2020-01-10 DIAGNOSIS — Z8261 Family history of arthritis: Secondary | ICD-10-CM

## 2020-01-10 DIAGNOSIS — Z90710 Acquired absence of both cervix and uterus: Secondary | ICD-10-CM

## 2020-01-10 DIAGNOSIS — Z888 Allergy status to other drugs, medicaments and biological substances status: Secondary | ICD-10-CM

## 2020-01-10 DIAGNOSIS — A4101 Sepsis due to Methicillin susceptible Staphylococcus aureus: Secondary | ICD-10-CM | POA: Diagnosis not present

## 2020-01-10 DIAGNOSIS — Z882 Allergy status to sulfonamides status: Secondary | ICD-10-CM

## 2020-01-10 DIAGNOSIS — I340 Nonrheumatic mitral (valve) insufficiency: Secondary | ICD-10-CM | POA: Diagnosis not present

## 2020-01-10 DIAGNOSIS — R270 Ataxia, unspecified: Secondary | ICD-10-CM | POA: Diagnosis not present

## 2020-01-10 DIAGNOSIS — Z880 Allergy status to penicillin: Secondary | ICD-10-CM

## 2020-01-10 DIAGNOSIS — F0280 Dementia in other diseases classified elsewhere without behavioral disturbance: Secondary | ICD-10-CM | POA: Diagnosis not present

## 2020-01-10 DIAGNOSIS — M4856XA Collapsed vertebra, not elsewhere classified, lumbar region, initial encounter for fracture: Secondary | ICD-10-CM | POA: Diagnosis not present

## 2020-01-10 DIAGNOSIS — Z79899 Other long term (current) drug therapy: Secondary | ICD-10-CM

## 2020-01-10 DIAGNOSIS — I361 Nonrheumatic tricuspid (valve) insufficiency: Secondary | ICD-10-CM | POA: Diagnosis not present

## 2020-01-10 DIAGNOSIS — Z66 Do not resuscitate: Secondary | ICD-10-CM | POA: Diagnosis not present

## 2020-01-10 DIAGNOSIS — B9561 Methicillin susceptible Staphylococcus aureus infection as the cause of diseases classified elsewhere: Secondary | ICD-10-CM | POA: Diagnosis not present

## 2020-01-10 DIAGNOSIS — K625 Hemorrhage of anus and rectum: Secondary | ICD-10-CM | POA: Diagnosis not present

## 2020-01-10 DIAGNOSIS — J9811 Atelectasis: Secondary | ICD-10-CM | POA: Diagnosis not present

## 2020-01-10 DIAGNOSIS — Z209 Contact with and (suspected) exposure to unspecified communicable disease: Secondary | ICD-10-CM | POA: Diagnosis not present

## 2020-01-10 DIAGNOSIS — R000 Tachycardia, unspecified: Secondary | ICD-10-CM | POA: Diagnosis not present

## 2020-01-10 LAB — COMPREHENSIVE METABOLIC PANEL
ALT: 19 U/L (ref 0–44)
AST: 33 U/L (ref 15–41)
Albumin: 3.8 g/dL (ref 3.5–5.0)
Alkaline Phosphatase: 40 U/L (ref 38–126)
Anion gap: 10 (ref 5–15)
BUN: 12 mg/dL (ref 8–23)
CO2: 24 mmol/L (ref 22–32)
Calcium: 9.1 mg/dL (ref 8.9–10.3)
Chloride: 104 mmol/L (ref 98–111)
Creatinine, Ser: 0.62 mg/dL (ref 0.44–1.00)
GFR calc Af Amer: >60 mL/min (ref >60)
GFR calc non Af Amer: >60 mL/min (ref >60)
Glucose, Bld: 179 mg/dL — ABNORMAL HIGH (ref 70–99)
Potassium: 3.2 mmol/L — ABNORMAL LOW (ref 3.5–5.1)
Sodium: 138 mmol/L (ref 135–145)
Total Bilirubin: 1.1 mg/dL (ref 0.3–1.2)
Total Protein: 6.7 g/dL (ref 6.5–8.1)

## 2020-01-10 LAB — RESPIRATORY PANEL BY RT PCR (FLU A&B, COVID)
Influenza A by PCR: NEGATIVE
Influenza B by PCR: NEGATIVE
SARS Coronavirus 2 by RT PCR: NEGATIVE

## 2020-01-10 LAB — CBC WITH DIFFERENTIAL/PLATELET
Abs Immature Granulocytes: 0.03 10*3/uL (ref 0.00–0.07)
Basophils Absolute: 0 10*3/uL (ref 0.0–0.1)
Basophils Relative: 1 %
Eosinophils Absolute: 0 10*3/uL (ref 0.0–0.5)
Eosinophils Relative: 0 %
HCT: 43.4 % (ref 36.0–46.0)
Hemoglobin: 13.9 g/dL (ref 12.0–15.0)
Immature Granulocytes: 1 %
Lymphocytes Relative: 9 %
Lymphs Abs: 0.5 10*3/uL — ABNORMAL LOW (ref 0.7–4.0)
MCH: 32.2 pg (ref 26.0–34.0)
MCHC: 32 g/dL (ref 30.0–36.0)
MCV: 100.5 fL — ABNORMAL HIGH (ref 80.0–100.0)
Monocytes Absolute: 0.8 10*3/uL (ref 0.1–1.0)
Monocytes Relative: 14 %
Neutro Abs: 4.4 10*3/uL (ref 1.7–7.7)
Neutrophils Relative %: 75 %
Platelets: 216 10*3/uL (ref 150–400)
RBC: 4.32 MIL/uL (ref 3.87–5.11)
RDW: 12.5 % (ref 11.5–15.5)
WBC: 5.8 10*3/uL (ref 4.0–10.5)
nRBC: 0 % (ref 0.0–0.2)

## 2020-01-10 LAB — LACTIC ACID, PLASMA
Lactic Acid, Venous: 1.9 mmol/L (ref 0.5–1.9)
Lactic Acid, Venous: 1 mmol/L (ref 0.5–1.9)

## 2020-01-10 LAB — PROTIME-INR
INR: 1 (ref 0.8–1.2)
Prothrombin Time: 13.4 seconds (ref 11.4–15.2)

## 2020-01-10 LAB — APTT: aPTT: 27 seconds (ref 24–36)

## 2020-01-10 MED ORDER — SODIUM CHLORIDE 0.9 % IV SOLN: 1.0000 g | Freq: Once | INTRAVENOUS | Status: DC

## 2020-01-10 MED ORDER — SODIUM CHLORIDE 0.9 % IV SOLN
2.0000 g | INTRAVENOUS | Status: AC
  Administered 2020-01-10: 2 g via INTRAVENOUS
  Filled 2020-01-10: qty 2

## 2020-01-10 MED ORDER — SODIUM CHLORIDE 0.9 % IV BOLUS
1800.0000 mL | Freq: Once | INTRAVENOUS | Status: AC
  Administered 2020-01-10: 1800 mL via INTRAVENOUS

## 2020-01-10 MED ORDER — VANCOMYCIN HCL 1250 MG/250ML IV SOLN
1250.0000 mg | INTRAVENOUS | Status: AC
  Administered 2020-01-10: 1250 mg via INTRAVENOUS
  Filled 2020-01-10: qty 250

## 2020-01-10 NOTE — ED Triage Notes (Addendum)
[facility]  From urgent care  Abdominal problems 1 week ago - took 2 abx for it  Past 24 hours sudden change - fatigue, malaise, fever, generalized weakness, decreased mental status  Hx: dementia  Daughter says pt is more confused than normal   1,000 tylenol with EMS 20 L AC   124/56 100 HR 32 RR 101.9 oral 90% RA 94% 2 L [place] 178 CBG  Per daughter on phone with RN: - 1st covid shot on Friday - Clifford called daughter this AM stating the patient was very confused - patient normally ambulatory with walker, can't even stand now  - daughter took patient to urgent care - possible sepsis  - patient is definitely not at baseline  - hx: of UTI's, rectal bleeding 1x week ago, colitis (on abx for a week) - Daughter: [place] [phone])

## 2020-01-10 NOTE — Progress Notes (Signed)
A consult was received from an ED physician for Vancomycin per pharmacy dosing.  T  he patient's profile has been reviewed for ht/wt/allergies/indication/available labs.    A one time order has been placed for Vancomycin 1250mg  IV x 1.  Aztreonam 1gm IV x 1 originally ordered in patient with PCN allergy (reaction noted as unknown by patient).  Patient has tolerated by cefepime and cephalexin in the past.  Notified Dr Vanita Panda and received order to change Aztreonam to Cefepime.  Cefepime 2gm IV x 1    Further antibiotics/pharmacy consults should be ordered by admitting physician if indicated.                       Thank you, Everette Rank, PharmD 01/10/2020  7:44 PM

## 2020-01-10 NOTE — ED Notes (Signed)
POA is daughter [place] [phone]).

## 2020-01-10 NOTE — ED Notes (Signed)
[name], ([id number])

## 2020-01-10 NOTE — ED Provider Notes (Signed)
[facility] DEPT Provider Note   CSN: [id number] Arrival date & time: 01/10/20  1655     History No chief complaint on file.   [name] is a 86 y.o. female.  HPI    Patient presents from a nursing facility with concern of fever, hypoxia, altered mental status. Patient does have dementia, but seems to answer questions about her current situation appropriately, though with no additional details regarding the past few days or prior to that. Patient denies pain, states that she feels weak in general, not specific, denies confusion. She notes that she is on antibiotics for an intestinal infection, cannot specify which medication, nor for how long. She denies diarrhea, acknowledges nausea. Per EMS report the patient was febrile, hypoxic, and less interactive than usual at her nursing facility.  Not she received her first dose of the Covid vaccine 2 days ago.   Past Medical History:  Diagnosis Date  . Aortic aneurysm, thoracic (HCC)    4.4 cm  . Aortic dilatation ([place])    Seen on cath 2005  . Arthritis   . CAD (coronary artery disease)    a. Nonobstructive by last cath in 05/2004 with 30% LAD. b. EF 65-70% 12/2010. c. Hx ASA intolerance.   . Colitis    12/2010 associated with transient lower GI bleeding/anemia tx with abx  . GERD (gastroesophageal reflux disease)    a. Minimal hiatal hernia on EGD 2006, no obvious esophageal source for CP at that time.  . Hypercholesteremia    Hx of myalgias felt to be statin related but taking Crestor as OP  . Hypertension   . Osteoporosis   . Stroke [facility])    TIA    Patient Active Problem List   Diagnosis Date Noted  . Alzheimer's disease ([place]) 07/02/2019  . Acute lower UTI 02/10/2019  . Acute metabolic encephalopathy [id number]  . Encephalopathy acute 02/10/2019  . Chest pain, rule out acute myocardial infarction 09/03/2017  . Heart palpitations 07/31/2016  . Neck pain, musculoskeletal 04/26/2015   . Variants of migraine, not elsewhere classified, without mention of intractable migraine without mention of status migrainosus 08/17/2014  . Tension headache 08/17/2014  . TIA (transient ischemic attack) 07/30/2014  . HTN (hypertension) 07/30/2014  . Aortic aneurysm, thoracic ([place]) 06/25/2014  . Chest pain 07/29/2012  . CAD (coronary artery disease) 06/03/2012  . Hypercholesteremia   . GERD (gastroesophageal reflux disease)     Past Surgical History:  Procedure Laterality Date  . ABDOMINAL HYSTERECTOMY    . BACK SURGERY     Disc disease  . hysterectomy -- unknown type    . SHOULDER SURGERY    . Shunt for syringomyelia 1987    . TEMPORAL ARTERY BIOPSY / LIGATION     Negative 2005     OB History   No obstetric history on file.     Family History  Problem Relation Age of Onset  . Heart disease Mother   . Heart failure Mother   . Arthritis Mother   . Heart disease Brother   . Heart disease Other   . Heart failure Other     Social History   Tobacco Use  . Smoking status: Former Smoker    Quit date: 04/26/1959    Years since quitting: 60.7  . Smokeless tobacco: Never Used  Substance Use Topics  . Alcohol use: No    Alcohol/week: 1.0 standard drinks    Types: 1 Glasses of wine per week  . Drug  use: No    Home Medications Prior to Admission medications   Medication Sig Start Date End Date Taking? Authorizing Provider  acetaminophen (TYLENOL) 500 MG tablet Take 500 mg by mouth 3 (three) times daily as needed for mild pain.    [provider]  Calcium Carb-Cholecalciferol (CALCIUM-VITAMIN D) 500-400 MG-UNIT TABS Take 1 tablet by mouth daily.    [provider]  cholecalciferol (VITAMIN D) 1000 UNITS tablet Take 2,000 Units by mouth daily.     [provider]  clopidogrel (PLAVIX) 75 MG tablet Take 75 mg by mouth daily.    [provider]  donepezil (ARICEPT) 10 MG tablet Take 1 tablet (10 mg total) by mouth at bedtime. Start 1/2  tablet daily x 4 weeks and then 1 tablet daily 07/06/19 07/05/20  Garvin Fila, MD  famotidine (PEPCID) 10 MG tablet Take 20 mg by mouth 2 (two) times daily.    [provider]  hydrocortisone (ANUSOL-HC) 2.5 % rectal cream Place 1 application rectally 2 (two) times daily as needed for hemorrhoids or anal itching.    [provider]  loperamide (IMODIUM) 2 MG capsule  08/07/19   [provider]  LORazepam (ATIVAN) 0.5 MG tablet Take 1 tablet (0.5 mg total) by mouth 2 (two) times daily as needed for anxiety. 02/12/19   Aline August, MD  memantine (NAMENDA) 10 MG tablet  09/25/19   [provider]  methocarbamol (ROBAXIN) 500 MG tablet TK 1 T PO EVERY 6 TO 8 HOURS PRN FOR SPASMS/MUSCLE TENSION 08/15/19   [provider]  metoprolol succinate (TOPROL-XL) 25 MG 24 hr tablet Take 25 mg by mouth daily.    [provider]  Multiple Vitamin (MULTIVITAMIN WITH MINERALS) TABS tablet Take 1 tablet by mouth daily.    [provider]  Multiple Vitamins-Minerals (CENTRUM SILVER) CHEW Chew 1 tablet by mouth daily.    [provider]  Polyethyl Glycol-Propyl Glycol (SYSTANE OP) Apply 1-2 drops to eye 2 (two) times daily.    [provider]  polyethylene glycol powder (GLYCOLAX/MIRALAX) powder Take 17 g by mouth daily as needed for mild constipation. 02/12/19   Aline August, MD  PROCTOZONE-HC 2.5 % rectal cream  09/11/19   [provider]  traMADol Veatrice Bourbon) 50 MG tablet  10/10/19   [provider]    Allergies    Aspirin, Crestor [rosuvastatin], Bactrim, Demeclocycline, Niacin, Niacin and related, Penicillins, Relafen [nabumetone], Sulfa antibiotics, and Tetracyclines & related  Review of Systems   Review of Systems  Unable to perform ROS: Dementia  Constitutional: Positive for fatigue.  Respiratory: Negative for cough.   Cardiovascular: Negative for chest pain.  Gastrointestinal: Positive for nausea. Negative for  abdominal pain.  Neurological: Positive for weakness.    Physical Exam Updated Vital Signs BP (!) 95/52   Pulse 71   Temp 99.2 F (37.3 C) (Oral)   Resp 19   Wt 59 kg   SpO2 99%   BMI 24.56 kg/m   Physical Exam Vitals and nursing note reviewed.  Constitutional:      General: She is not in acute distress.    Appearance: She is well-developed.  HENT:     Head: Normocephalic and atraumatic.  Eyes:     Conjunctiva/sclera: Conjunctivae normal.  Cardiovascular:     Rate and Rhythm: Normal rate and regular rhythm.  Pulmonary:     Effort: Pulmonary effort is normal. No respiratory distress.     Breath sounds: Normal breath sounds. No stridor.  Abdominal:     General: There is no distension.     Tenderness: There is no abdominal tenderness. There is no guarding.  Skin:    General: Skin is warm and dry.  Neurological:     Mental Status: She is alert.     Cranial Nerves: No cranial nerve deficit.     Motor: Atrophy present.  Psychiatric:        Behavior: Behavior is slowed and withdrawn.     ED Results / Procedures / Treatments   Labs (all labs ordered are listed, but only abnormal results are displayed) Labs Reviewed  COMPREHENSIVE METABOLIC PANEL - Abnormal; Notable for the following components:      Result Value   Potassium 3.2 (*)    Glucose, Bld 179 (*)    All other components within normal limits  CBC WITH DIFFERENTIAL/PLATELET - Abnormal; Notable for the following components:   MCV 100.5 (*)    Lymphs Abs 0.5 (*)    All other components within normal limits  CULTURE, BLOOD (ROUTINE X 2)  RESPIRATORY PANEL BY RT PCR (FLU A&B, COVID)  CULTURE, BLOOD (ROUTINE X 2)  URINE CULTURE  LACTIC ACID, PLASMA  APTT  PROTIME-INR  LACTIC ACID, PLASMA  URINALYSIS, ROUTINE W REFLEX MICROSCOPIC    EKG None  Radiology DG Chest Port 1 View  Result Date: 01/10/2020 CLINICAL DATA:  Hypoxia. EXAM: PORTABLE CHEST 1 VIEW COMPARISON:  January 10, 2020 FINDINGS: Minimal  atelectasis in the left base. The heart, hila, mediastinum, lungs, and pleura are otherwise unremarkable. IMPRESSION: Minimal atelectasis in the left base.  No other acute abnormalities. Electronically Signed   By: Dorise Bullion III M.D   On: 01/10/2020 18:03    Procedures Procedures (including critical care time)  Medications Ordered in ED Medications  vancomycin (VANCOREADY) IVPB 1250 mg/250 mL (0 mg Intravenous Hold 01/10/20 2011)  sodium chloride 0.9 % bolus 1,800 mL (1,800 mLs Intravenous New Bag/Given 01/10/20 2008)  ceFEPIme (MAXIPIME) 2 g in sodium chloride 0.9 % 100 mL IVPB (2 g Intravenous New Bag/Given 01/10/20 2009)    ED Course  I have reviewed the triage vital signs and the nursing notes.  Pertinent labs & imaging results that were available during my care of the patient were reviewed by me and considered in my medical decision making (see chart for details).  Update:, Where the patient was initially normotensive, blood pressure has diminished after initial evaluation.  She is receiving fluid resuscitation per sepsis algorithm, with consideration of pneumonia, with slight opacification in the left lung base, patient will receive antibiotics appropriate.  Initial lactic acid reassuring.   9:04 PM Covid test negative. However, with hypotension, new oxygen requirement, consideration of pneumonia versus other respiratory pathology, the patient will require admission for ongoing therapy.  Patient meets multiple SIRS criteria, and with hypotension, hypoxia, will require monitoring.  Final Clinical Impression(s) / ED Diagnoses Health care associated pneumonia SIRS   Carmin Muskrat, MD 01/10/20 2106

## 2020-01-11 ENCOUNTER — Inpatient Hospital Stay (HOSPITAL_COMMUNITY): Payer: PPO

## 2020-01-11 ENCOUNTER — Encounter (HOSPITAL_COMMUNITY): Payer: Self-pay | Admitting: Internal Medicine

## 2020-01-11 DIAGNOSIS — E876 Hypokalemia: Secondary | ICD-10-CM

## 2020-01-11 DIAGNOSIS — I2510 Atherosclerotic heart disease of native coronary artery without angina pectoris: Secondary | ICD-10-CM

## 2020-01-11 DIAGNOSIS — F0390 Unspecified dementia without behavioral disturbance: Secondary | ICD-10-CM

## 2020-01-11 DIAGNOSIS — R7881 Bacteremia: Principal | ICD-10-CM

## 2020-01-11 DIAGNOSIS — Z87891 Personal history of nicotine dependence: Secondary | ICD-10-CM

## 2020-01-11 DIAGNOSIS — J189 Pneumonia, unspecified organism: Secondary | ICD-10-CM | POA: Diagnosis present

## 2020-01-11 DIAGNOSIS — B9561 Methicillin susceptible Staphylococcus aureus infection as the cause of diseases classified elsewhere: Secondary | ICD-10-CM

## 2020-01-11 DIAGNOSIS — K625 Hemorrhage of anus and rectum: Secondary | ICD-10-CM

## 2020-01-11 DIAGNOSIS — A419 Sepsis, unspecified organism: Secondary | ICD-10-CM

## 2020-01-11 DIAGNOSIS — Z8673 Personal history of transient ischemic attack (TIA), and cerebral infarction without residual deficits: Secondary | ICD-10-CM

## 2020-01-11 LAB — CREATININE, SERUM
Creatinine, Ser: 0.62 mg/dL (ref 0.44–1.00)
GFR calc Af Amer: >60 mL/min (ref >60)
GFR calc non Af Amer: >60 mL/min (ref >60)

## 2020-01-11 LAB — CBC WITH DIFFERENTIAL/PLATELET
Abs Immature Granulocytes: 0.02 10*3/uL (ref 0.00–0.07)
Basophils Absolute: 0 10*3/uL (ref 0.0–0.1)
Basophils Relative: 1 %
Eosinophils Absolute: 0.1 10*3/uL (ref 0.0–0.5)
Eosinophils Relative: 2 %
HCT: 38.7 % (ref 36.0–46.0)
Hemoglobin: 12.3 g/dL (ref 12.0–15.0)
Immature Granulocytes: 0 %
Lymphocytes Relative: 11 %
Lymphs Abs: 0.7 10*3/uL (ref 0.7–4.0)
MCH: 32.7 pg (ref 26.0–34.0)
MCHC: 31.8 g/dL (ref 30.0–36.0)
MCV: 102.9 fL — ABNORMAL HIGH (ref 80.0–100.0)
Monocytes Absolute: 0.9 10*3/uL (ref 0.1–1.0)
Monocytes Relative: 14 %
Neutro Abs: 4.8 10*3/uL (ref 1.7–7.7)
Neutrophils Relative %: 72 %
Platelets: 176 10*3/uL (ref 150–400)
RBC: 3.76 MIL/uL — ABNORMAL LOW (ref 3.87–5.11)
RDW: 13.1 % (ref 11.5–15.5)
WBC: 6.5 10*3/uL (ref 4.0–10.5)
nRBC: 0 % (ref 0.0–0.2)

## 2020-01-11 LAB — BLOOD CULTURE ID PANEL (REFLEXED)

## 2020-01-11 LAB — URINALYSIS, ROUTINE W REFLEX MICROSCOPIC
Bacteria, UA: NONE SEEN
Bilirubin Urine: NEGATIVE
Glucose, UA: NEGATIVE mg/dL
Hgb urine dipstick: NEGATIVE
Ketones, ur: 5 mg/dL — AB
Nitrite: NEGATIVE
Protein, ur: NEGATIVE mg/dL
Specific Gravity, Urine: 1.03 (ref 1.005–1.030)
pH: 5 (ref 5.0–8.0)

## 2020-01-11 LAB — SARS CORONAVIRUS 2 (TAT 6-24 HRS): SARS Coronavirus 2: NEGATIVE

## 2020-01-11 LAB — CBC
HCT: 34.4 % — ABNORMAL LOW (ref 36.0–46.0)
Hemoglobin: 10.9 g/dL — ABNORMAL LOW (ref 12.0–15.0)
MCH: 32.6 pg (ref 26.0–34.0)
MCHC: 31.7 g/dL (ref 30.0–36.0)
MCV: 103 fL — ABNORMAL HIGH (ref 80.0–100.0)
Platelets: 159 10*3/uL (ref 150–400)
RBC: 3.34 MIL/uL — ABNORMAL LOW (ref 3.87–5.11)
RDW: 12.9 % (ref 11.5–15.5)
WBC: 4.7 10*3/uL (ref 4.0–10.5)
nRBC: 0 % (ref 0.0–0.2)

## 2020-01-11 LAB — COMPREHENSIVE METABOLIC PANEL
ALT: 19 U/L (ref 0–44)
AST: 27 U/L (ref 15–41)
Albumin: 3.2 g/dL — ABNORMAL LOW (ref 3.5–5.0)
Alkaline Phosphatase: 33 U/L — ABNORMAL LOW (ref 38–126)
Anion gap: 11 (ref 5–15)
BUN: 14 mg/dL (ref 8–23)
CO2: 27 mmol/L (ref 22–32)
Calcium: 8.2 mg/dL — ABNORMAL LOW (ref 8.9–10.3)
Chloride: 104 mmol/L (ref 98–111)
Creatinine, Ser: 0.49 mg/dL (ref 0.44–1.00)
GFR calc Af Amer: >60 mL/min (ref >60)
GFR calc non Af Amer: >60 mL/min (ref >60)
Glucose, Bld: 113 mg/dL — ABNORMAL HIGH (ref 70–99)
Potassium: 3.3 mmol/L — ABNORMAL LOW (ref 3.5–5.1)
Sodium: 142 mmol/L (ref 135–145)
Total Bilirubin: 0.9 mg/dL (ref 0.3–1.2)
Total Protein: 5.8 g/dL — ABNORMAL LOW (ref 6.5–8.1)

## 2020-01-11 LAB — HEMOGLOBIN A1C
Hgb A1c MFr Bld: 5.6 % (ref 4.8–5.6)
Mean Plasma Glucose: 114.02 mg/dL

## 2020-01-11 LAB — LACTIC ACID, PLASMA: Lactic Acid, Venous: 0.8 mmol/L (ref 0.5–1.9)

## 2020-01-11 LAB — TROPONIN I (HIGH SENSITIVITY): Troponin I (High Sensitivity): 12 ng/L (ref <18)

## 2020-01-11 LAB — D-DIMER, QUANTITATIVE: D-Dimer, Quant: 2.44 ug/mL-FEU — ABNORMAL HIGH (ref 0.00–0.50)

## 2020-01-11 LAB — PROCALCITONIN: Procalcitonin: 0.1 ng/mL

## 2020-01-11 MED ORDER — DONEPEZIL HCL 10 MG PO TABS
10.0000 mg | ORAL_TABLET | Freq: Every day | ORAL | Status: DC
  Administered 2020-01-11 – 2020-01-13 (×3): 10 mg via ORAL
  Filled 2020-01-11: qty 2
  Filled 2020-01-11 (×2): qty 1

## 2020-01-11 MED ORDER — SODIUM CHLORIDE 0.9 % IV SOLN
500.0000 mg | Freq: Every day | INTRAVENOUS | Status: DC
  Administered 2020-01-11: 500 mg via INTRAVENOUS
  Filled 2020-01-11: qty 500

## 2020-01-11 MED ORDER — ENOXAPARIN SODIUM 30 MG/0.3ML ~~LOC~~ SOLN: 30.0000 mg | SUBCUTANEOUS | Status: DC

## 2020-01-11 MED ORDER — SODIUM CHLORIDE 0.9 % IV SOLN
2.0000 g | Freq: Every day | INTRAVENOUS | Status: DC
  Administered 2020-01-11: 2 g via INTRAVENOUS
  Filled 2020-01-11: qty 20

## 2020-01-11 MED ORDER — CLOPIDOGREL BISULFATE 75 MG PO TABS
75.0000 mg | ORAL_TABLET | Freq: Every day | ORAL | Status: DC
  Administered 2020-01-11 – 2020-01-14 (×4): 75 mg via ORAL
  Filled 2020-01-11 (×4): qty 1

## 2020-01-11 MED ORDER — LORAZEPAM 0.5 MG PO TABS
0.5000 mg | ORAL_TABLET | Freq: Two times a day (BID) | ORAL | Status: DC | PRN
  Administered 2020-01-11 – 2020-01-13 (×3): 0.5 mg via ORAL
  Filled 2020-01-11 (×3): qty 1

## 2020-01-11 MED ORDER — POTASSIUM CHLORIDE CRYS ER 20 MEQ PO TBCR
20.0000 meq | EXTENDED_RELEASE_TABLET | Freq: Once | ORAL | Status: AC
  Administered 2020-01-11: 20 meq via ORAL
  Filled 2020-01-11: qty 1

## 2020-01-11 MED ORDER — ONDANSETRON HCL 4 MG/2ML IJ SOLN: 4.0000 mg | Freq: Four times a day (QID) | INTRAMUSCULAR | Status: DC | PRN

## 2020-01-11 MED ORDER — ACETAMINOPHEN 650 MG RE SUPP: 650.0000 mg | Freq: Four times a day (QID) | RECTAL | Status: DC | PRN

## 2020-01-11 MED ORDER — ACETAMINOPHEN 325 MG PO TABS
650.0000 mg | ORAL_TABLET | Freq: Four times a day (QID) | ORAL | Status: DC | PRN
  Administered 2020-01-12 – 2020-01-14 (×6): 650 mg via ORAL
  Filled 2020-01-11 (×6): qty 2

## 2020-01-11 MED ORDER — ENOXAPARIN SODIUM 40 MG/0.4ML ~~LOC~~ SOLN: 40.0000 mg | SUBCUTANEOUS | Status: DC

## 2020-01-11 MED ORDER — CEFAZOLIN SODIUM-DEXTROSE 2-4 GM/100ML-% IV SOLN
2.0000 g | Freq: Three times a day (TID) | INTRAVENOUS | Status: DC
  Administered 2020-01-11 – 2020-01-14 (×10): 2 g via INTRAVENOUS
  Filled 2020-01-11 (×11): qty 100

## 2020-01-11 MED ORDER — METOPROLOL SUCCINATE ER 25 MG PO TB24: 25.0000 mg | ORAL_TABLET | Freq: Every day | ORAL | Status: DC

## 2020-01-11 MED ORDER — ONDANSETRON HCL 4 MG PO TABS: 4.0000 mg | ORAL_TABLET | Freq: Four times a day (QID) | ORAL | Status: DC | PRN

## 2020-01-11 MED ORDER — ENOXAPARIN SODIUM 40 MG/0.4ML ~~LOC~~ SOLN
40.0000 mg | SUBCUTANEOUS | Status: DC
  Administered 2020-01-11 – 2020-01-14 (×4): 40 mg via SUBCUTANEOUS
  Filled 2020-01-11 (×4): qty 0.4

## 2020-01-11 MED ORDER — MEMANTINE HCL 10 MG PO TABS
10.0000 mg | ORAL_TABLET | Freq: Every day | ORAL | Status: DC
  Administered 2020-01-11 – 2020-01-14 (×4): 10 mg via ORAL
  Filled 2020-01-11 (×4): qty 1

## 2020-01-11 MED ORDER — FAMOTIDINE 20 MG PO TABS
20.0000 mg | ORAL_TABLET | Freq: Two times a day (BID) | ORAL | Status: DC
  Administered 2020-01-11 – 2020-01-14 (×7): 20 mg via ORAL
  Filled 2020-01-11 (×7): qty 1

## 2020-01-11 NOTE — Progress Notes (Signed)
PHARMACY - PHYSICIAN COMMUNICATION CRITICAL VALUE ALERT - BLOOD CULTURE IDENTIFICATION (BCID)  [name] is an 86 y.o. female who presented to [facility] on 01/10/2020 with a chief complaint of fever & confusion  Assessment:  Unknown source  Name of physician (or Provider) Contacted: [place] via Mikes  Current antibiotics: Ceftriaxone & azithromycin  Changes to prescribed antibiotics recommended:  Ancef 2 gm IV q8h, automatic ID consult  Results for orders placed or performed during the hospital encounter of 01/10/20  Blood Culture ID Panel (Reflexed) (Collected: 01/10/2020  5:28 PM)  Result Value Ref Range   Enterococcus species NOT DETECTED NOT DETECTED   Listeria monocytogenes NOT DETECTED NOT DETECTED   Staphylococcus species DETECTED (A) NOT DETECTED   Staphylococcus aureus (BCID) DETECTED (A) NOT DETECTED   Methicillin resistance NOT DETECTED NOT DETECTED   Streptococcus species NOT DETECTED NOT DETECTED   Streptococcus agalactiae NOT DETECTED NOT DETECTED   Streptococcus pneumoniae NOT DETECTED NOT DETECTED   Streptococcus pyogenes NOT DETECTED NOT DETECTED   Acinetobacter baumannii NOT DETECTED NOT DETECTED   Enterobacteriaceae species NOT DETECTED NOT DETECTED   Enterobacter cloacae complex NOT DETECTED NOT DETECTED   Escherichia coli NOT DETECTED NOT DETECTED   Klebsiella oxytoca NOT DETECTED NOT DETECTED   Klebsiella pneumoniae NOT DETECTED NOT DETECTED   Proteus species NOT DETECTED NOT DETECTED   Serratia marcescens NOT DETECTED NOT DETECTED   Haemophilus influenzae NOT DETECTED NOT DETECTED   Neisseria meningitidis NOT DETECTED NOT DETECTED   Pseudomonas aeruginosa NOT DETECTED NOT DETECTED   Candida albicans NOT DETECTED NOT DETECTED   Candida glabrata NOT DETECTED NOT DETECTED   Candida krusei NOT DETECTED NOT DETECTED   Candida parapsilosis NOT DETECTED NOT DETECTED   Candida tropicalis NOT DETECTED NOT DETECTED    Eudelia Bunch,  Pharm.D [phone] 01/11/2020 1:06 PM

## 2020-01-11 NOTE — H&P (Addendum)
History and Physical    [name] [id number] DOB: Nov 05, 1926 DOA: 01/10/2020  PCP: Robyne Peers, MD  Patient coming from: Assisted living facility.  Chief Complaint: Confusion fever.  History obtained from patient's daughter.  HPI: [name] is a 86 y.o. female with history of TIA, nonobstructive CAD dementia was brought to the ER after patient was found to have fever and confusion.  As per the daughter who provided most of the history patient had an episode of rectal bleeding about 7 days ago at her assisted living facility and since there was some concern for possible colitis patient was started on Cipro and Flagyl.  Yesterday patient became more confused and at this point patient's daughter went to check on her and took her to the urgent care.  Patient is very weak to take to the urgent care.  At the urgent care patient had a temperature 101 F with confusion and given the symptoms patient's daughter was concerned for sepsis.  Per daughter patient had only 1 more episode of red bleeding from the rectum about 5 days ago which stopped without intervention.  Did not have any cough or chest pain nausea vomiting or diarrhea.  ED Course: In the ER patient was hypoxic hypotensive temperature of 99 F with chest x-ray showing some ankle ectasis blood cultures obtained since concern for sepsis.  Patient was started on empiric antibiotics.  Covid test was initially negative repeat has been ordered given the concerns.  Patient admitted for further management of possible sepsis not clear source known.  On my exam patient is oriented to her name and follows commands.  Per patient's daughter patient is more alert and awake than this.  Labs show mild hypokalemia of 3.2 lactic acid 1.9 CBC only showing macrocytosis otherwise unremarkable.  Chest x-ray showing atelectasis.  Review of Systems: As per HPI, rest all negative.   Past Medical History:  Diagnosis Date  . Aortic  aneurysm, thoracic (HCC)    4.4 cm  . Aortic dilatation ([place])    Seen on cath 2005  . Arthritis   . CAD (coronary artery disease)    a. Nonobstructive by last cath in 05/2004 with 30% LAD. b. EF 65-70% 12/2010. c. Hx ASA intolerance.   . Colitis    12/2010 associated with transient lower GI bleeding/anemia tx with abx  . GERD (gastroesophageal reflux disease)    a. Minimal hiatal hernia on EGD 2006, no obvious esophageal source for CP at that time.  . Hypercholesteremia    Hx of myalgias felt to be statin related but taking Crestor as OP  . Hypertension   . Osteoporosis   . Stroke [facility])    TIA    Past Surgical History:  Procedure Laterality Date  . ABDOMINAL HYSTERECTOMY    . BACK SURGERY     Disc disease  . hysterectomy -- unknown type    . SHOULDER SURGERY    . Shunt for syringomyelia 1987    . TEMPORAL ARTERY BIOPSY / LIGATION     Negative 2005     reports that she quit smoking about 60 years ago. She has never used smokeless tobacco. She reports that she does not drink alcohol or use drugs.  Allergies  Allergen Reactions  . Aspirin Other (See Comments)    Feels like choking  . Crestor [Rosuvastatin] Other (See Comments)    Lower Extremity Myalgia Lower ext myalgia  . Bactrim Other (See Comments)    Unknown  .  Demeclocycline Rash  . Niacin Other (See Comments)    Turns Face Red  . Niacin And Related Other (See Comments)    Turns red in the face  . Penicillins Other (See Comments)    Unknown Reaction Per The Patient Unknown  . Relafen [Nabumetone] Other (See Comments)    Unknown Reaction Per The Patient Unknown  . Sulfa Antibiotics Other (See Comments)    Unknown Reaction Per The Patient Unknown  . Tetracyclines & Related Rash    Family History  Problem Relation Age of Onset  . Heart disease Mother   . Heart failure Mother   . Arthritis Mother   . Heart disease Brother   . Heart disease Other   . Heart failure Other     Prior to Admission  medications   Medication Sig Start Date End Date Taking? Authorizing Provider  acetaminophen (TYLENOL) 500 MG tablet Take 500 mg by mouth 3 (three) times daily as needed for mild pain.    [provider]  Calcium Carb-Cholecalciferol (CALCIUM-VITAMIN D) 500-400 MG-UNIT TABS Take 1 tablet by mouth daily.    [provider]  cholecalciferol (VITAMIN D) 1000 UNITS tablet Take 2,000 Units by mouth daily.     [provider]  clopidogrel (PLAVIX) 75 MG tablet Take 75 mg by mouth daily.    [provider]  donepezil (ARICEPT) 10 MG tablet Take 1 tablet (10 mg total) by mouth at bedtime. Start 1/2 tablet daily x 4 weeks and then 1 tablet daily 07/06/19 07/05/20  Garvin Fila, MD  famotidine (PEPCID) 10 MG tablet Take 20 mg by mouth 2 (two) times daily.    [provider]  hydrocortisone (ANUSOL-HC) 2.5 % rectal cream Place 1 application rectally 2 (two) times daily as needed for hemorrhoids or anal itching.    [provider]  loperamide (IMODIUM) 2 MG capsule  08/07/19   [provider]  LORazepam (ATIVAN) 0.5 MG tablet Take 1 tablet (0.5 mg total) by mouth 2 (two) times daily as needed for anxiety. 02/12/19   Aline August, MD  memantine (NAMENDA) 10 MG tablet  09/25/19   [provider]  methocarbamol (ROBAXIN) 500 MG tablet TK 1 T PO EVERY 6 TO 8 HOURS PRN FOR SPASMS/MUSCLE TENSION 08/15/19   [provider]  metoprolol succinate (TOPROL-XL) 25 MG 24 hr tablet Take 25 mg by mouth daily.    [provider]  Multiple Vitamin (MULTIVITAMIN WITH MINERALS) TABS tablet Take 1 tablet by mouth daily.    [provider]  Multiple Vitamins-Minerals (CENTRUM SILVER) CHEW Chew 1 tablet by mouth daily.    [provider]  Polyethyl Glycol-Propyl Glycol (SYSTANE OP) Apply 1-2 drops to eye 2 (two) times daily.    [provider]  polyethylene glycol powder (GLYCOLAX/MIRALAX) powder Take 17 g by mouth daily  as needed for mild constipation. 02/12/19   Aline August, MD  PROCTOZONE-HC 2.5 % rectal cream  09/11/19   [provider]  traMADol Veatrice Bourbon) 50 MG tablet  10/10/19   [provider]    Physical Exam: Constitutional: Moderately built and nourished. Vitals:   01/10/20 2300 01/10/20 2330 01/11/20 0000 01/11/20 0030  BP: (!) 117/54 (!) 107/52 (!) 117/57 (!) 114/51  Pulse: 66 64 66 65  Resp: (!) 23 (!) 22 (!) 24 (!) 21  Temp:      TempSrc:      SpO2: 98% 97% 99% 97%  Weight:       Eyes: Anicteric  no pallor. ENMT: No discharge from the ears eyes nose or mouth. Neck: No mass or.  No neck rigidity. Respiratory: No rhonchi or crepitations. Cardiovascular: S1-S2 heard. Abdomen: Soft nontender bowel sounds present. Musculoskeletal: No edema. Skin: No rash. Neurologic: Alert awake oriented to name follows commands moves all extremities. Psychiatric: Oriented to her name.   Labs on Admission: I have personally reviewed following labs and imaging studies  CBC: Recent Labs  Lab 01/10/20 1723  WBC 5.8  NEUTROABS 4.4  HGB 13.9  HCT 43.4  MCV 100.5*  PLT 123XX123   Basic Metabolic Panel: Recent Labs  Lab 01/10/20 1723  NA 138  K 3.2*  CL 104  CO2 24  GLUCOSE 179*  BUN 12  CREATININE 0.62  CALCIUM 9.1   GFR: CrCl cannot be calculated (Unknown ideal weight.). Liver Function Tests: Recent Labs  Lab 01/10/20 1723  AST 33  ALT 19  ALKPHOS 40  BILITOT 1.1  PROT 6.7  ALBUMIN 3.8   No results for input(s): LIPASE, AMYLASE in the last 168 hours. No results for input(s): AMMONIA in the last 168 hours. Coagulation Profile: Recent Labs  Lab 01/10/20 1723  INR 1.0   Cardiac Enzymes: No results for input(s): CKTOTAL, CKMB, CKMBINDEX, TROPONINI in the last 168 hours. BNP (last 3 results) No results for input(s): PROBNP in the last 8760 hours. HbA1C: No results for input(s): HGBA1C in the last 72 hours. CBG: No results for input(s): GLUCAP in the last  168 hours. Lipid Profile: No results for input(s): CHOL, HDL, LDLCALC, TRIG, CHOLHDL, LDLDIRECT in the last 72 hours. Thyroid Function Tests: No results for input(s): TSH, T4TOTAL, FREET4, T3FREE, THYROIDAB in the last 72 hours. Anemia Panel: No results for input(s): VITAMINB12, FOLATE, FERRITIN, TIBC, IRON, RETICCTPCT in the last 72 hours. Urine analysis:    Component Value Date/Time   COLORURINE STRAW (A) 03/02/2019 1828   APPEARANCEUR CLEAR 03/02/2019 1828   LABSPEC 1.008 03/02/2019 1828   PHURINE 7.0 03/02/2019 1828   GLUCOSEU NEGATIVE 03/02/2019 1828   HGBUR NEGATIVE 03/02/2019 1828   BILIRUBINUR NEGATIVE 03/02/2019 1828   BILIRUBINUR neg 03/07/2015 1506   KETONESUR NEGATIVE 03/02/2019 1828   PROTEINUR NEGATIVE 03/02/2019 1828   UROBILINOGEN 0.2 03/07/2015 1506   UROBILINOGEN 0.2 07/30/2014 1236   NITRITE NEGATIVE 03/02/2019 1828   LEUKOCYTESUR SMALL (A) 03/02/2019 1828   Sepsis Labs: @LABRCNTIP (procalcitonin:4,lacticidven:4) ) Recent Results (from the past 240 hour(s))  Blood Culture (routine x 2)     Status: None (Preliminary result)   Collection Time: 01/10/20  5:23 PM   Specimen: BLOOD LEFT ARM  Result Value Ref Range Status   Specimen Description   Final    BLOOD LEFT ARM Performed at [facility], [place] [address], [place], [place] [address]    Special Requests   Final    BOTTLES DRAWN AEROBIC AND ANAEROBIC Blood Culture adequate volume Performed at [facility] [address] [place], [place] [address]    Culture PENDING  Incomplete   Report Status PENDING  Incomplete  Respiratory Panel by RT PCR (Flu A&B, Covid) - Nasopharyngeal Swab     Status: None   Collection Time: 01/10/20  5:24 PM   Specimen: Nasopharyngeal Swab  Result Value Ref Range Status   SARS Coronavirus 2 by RT PCR NEGATIVE NEGATIVE Final    Comment: (NOTE) SARS-CoV-2 target nucleic acids are NOT DETECTED. The SARS-CoV-2 RNA is generally detectable in upper  respiratoy specimens during the acute phase of infection. The lowest  concentration of SARS-CoV-2 viral copies this assay can detect is 131 copies/mL. A negative result does not preclude SARS-Cov-2 infection and should not be used as the sole basis for treatment or other patient management decisions. A negative result may occur with  improper specimen collection/handling, submission of specimen other than nasopharyngeal swab, presence of viral mutation(s) within the areas targeted by this assay, and inadequate number of viral copies (<131 copies/mL). A negative result must be combined with clinical observations, patient history, and epidemiological information. The expected result is Negative. Fact Sheet for Patients:  PinkCheek.be Fact Sheet for Healthcare Providers:  GravelBags.it This test is not yet ap proved or cleared by the [place] FDA and  has been authorized for detection and/or diagnosis of SARS-CoV-2 by FDA under an Emergency Use Authorization (EUA). This EUA will remain  in effect (meaning this test can be used) for the duration of the COVID-19 declaration under Section 564(b)(1) of the Act, 21 U.S.C. section 360bbb-3(b)(1), unless the authorization is terminated or revoked sooner.    Influenza A by PCR NEGATIVE NEGATIVE Final   Influenza B by PCR NEGATIVE NEGATIVE Final    Comment: (NOTE) The Xpert Xpress SARS-CoV-2/FLU/RSV assay is intended as an aid in  the diagnosis of influenza from Nasopharyngeal swab specimens and  should not be used as a sole basis for treatment. Nasal washings and  aspirates are unacceptable for Xpert Xpress SARS-CoV-2/FLU/RSV  testing. Fact Sheet for Patients: PinkCheek.be Fact Sheet for Healthcare Providers: GravelBags.it This test is not yet approved or cleared by the [place] FDA and  has been authorized for  detection and/or diagnosis of SARS-CoV-2 by  FDA under an Emergency Use Authorization (EUA). This EUA will remain  in effect (meaning this test can be used) for the duration of the  Covid-19 declaration under Section 564(b)(1) of the Act, 21  U.S.C. section 360bbb-3(b)(1), unless the authorization is  terminated or revoked. Performed at [facility], [place] [address], [place], [place] [address]      Radiological Exams on Admission: DG Chest Port 1 View  Result Date: 01/10/2020 CLINICAL DATA:  Hypoxia. EXAM: PORTABLE CHEST 1 VIEW COMPARISON:  January 10, 2020 FINDINGS: Minimal atelectasis in the left base. The heart, hila, mediastinum, lungs, and pleura are otherwise unremarkable. IMPRESSION: Minimal atelectasis in the left base.  No other acute abnormalities. Electronically Signed   By: Dorise Bullion III M.D   On: 01/10/2020 18:03     Assessment/Plan Principal Problem:   Sepsis ([place]) Active Problems:   CAD (coronary artery disease)   Aortic aneurysm, thoracic (HCC)   HTN (hypertension)   Alzheimer's disease ([place])   CAP (community acquired pneumonia)    1. Possible developing sepsis given that patient was hypoxic hypotensive and had a fever 101 degrees before coming to the ER at the urgent care center per the daughter has been started on empiric antibiotics suspecting possible pneumonia given the atelectasis at the lung.  Follow blood cultures check UA urine cultures and since clear source not known and patient had recent abdominal issues will check CT abdomen. 2. Acute encephalopathy likely from sepsis.  CT head pending. 3. History of TIA on Plavix. 4. History of dementia on Aricept and Namenda. 5. History of hypertension on Toprol XL which I am holding off now due to the hypotensive at presentation.  Given the sepsis-like picture will need inpatient status.  Repeat Covid test is pending.   DVT prophylaxis: Lovenox.  Note that patient has had recent GI  bleed. Code Status: DNR confirmed with patient's daughter. Family Communication: Patient's daughter. Disposition Plan: Back to facility when stable. Consults called: None. Admission status: Inpatient.   Rise Patience MD [facility] Pager [phone].  If 7PM-7AM, please contact night-coverage [email or web address] Password [email or web address]  01/11/2020, 1:01 AM

## 2020-01-11 NOTE — ED Notes (Signed)
Patient instructed that she has a purewick on and can urinate if she needs to. Will follow up for UA.

## 2020-01-11 NOTE — Progress Notes (Addendum)
Patient seen and examined this morning, admitted overnight, H&P reviewed and agree with the assessment and plan.  86 year old female with prior TIA, nonobstructive CAD, dementia who was brought in from her assisted living facility due to fever and confusion.  Patient received COVID-19 vaccine dose #1 on 1/8.  Daughter reports her episode of rectal bleeding about 7 days ago and she was treated with ciprofloxacin and metronidazole for possible colitis.  The day prior to admission patient became more confused and had a fever of 101, and she was brought to the hospital.  On my evaluation patient is alert, pleasant, does not really know why she is here but feels well currently.   Possible developing sepsis -Chest x-ray with concern for possible pneumonia/atelectasis, she was started on ceftriaxone and azithromycin, continue to monitor cultures -Potentially side effect from vaccine although dose #1 is less likely to cause this  Acute metabolic encephalopathy -Likely from sepsis, CT scan unremarkable  History of TIA -Continue Plavix   History of dementia -continue Aricept, Namenda  History of hypertension -Hold Toprol due to hypotension on presentation   Karen Prochnow M. Cruzita Lederer, MD, PhD [facility]  Between 7 am - 7 pm I am available, please contact me via Amion or Securechat Between 7 pm - 7 am I am not available, please contact night coverage MD/APP via Amion

## 2020-01-11 NOTE — ED Notes (Signed)
Patient given meal tray at this time.

## 2020-01-11 NOTE — Consult Note (Addendum)
[place] for Infectious Disease    Date of Admission:  01/10/2020   Total days of antibiotics: 1    azactam/vanco/cefepime --> ceftriaxone/azithro --> ancef.                Reason for Consult: Staph aureus bacteremia    Referring Provider: CHAMP!   Assessment: MSSA bacteremia Sepsis BRBPR Dementia CAD hypokalemia  Plan: 1. Continue ancef 2. Repeat BCx 3. Check TTE 4. Continue ancef 5. Appreciate pharm partnerering with [place].  6. Primary to address K+ 7. H/H has dropped from baseline, could consider GI eval? Again not clear of appropriateness of invasive procedures in this 86 yo F  Comment- Would be hesitant to get TEE on pt with these comorbidities.  Will await her current test prior to making plans about dispo.   Thank you so much for this interesting consult,  Principal Problem:   Sepsis ([place]) Active Problems:   CAD (coronary artery disease)   Aortic aneurysm, thoracic ([place])   HTN (hypertension)   Alzheimer's disease ([place])   CAP (community acquired pneumonia)   . clopidogrel  75 mg Oral Daily  . donepezil  10 mg Oral QHS  . enoxaparin (LOVENOX) injection  40 mg Subcutaneous Q24H  . famotidine  20 mg Oral BID  . memantine  10 mg Oral Daily    HPI: [name] is a 86 y.o. female with hx of TIA, dementia, comes to ED on 1-10 with fever and worsening confusion.  She lives in SNF and roughly 1 week ago she had episode of BRBPR and was started on cipro/flagyl for this. She has since had an additional episode on 1-4 but since resolved.  She was seen in urgent care for fever 101, fatigue and worsening confusion, on 1-10 and was then brought to [facility].  In ED there was concern for sepsis due to hypotension, hypoxia.  She was started on azactam/vanco/cefepime --> ceftriaxone/azithro --> ancef.  COVID (-). Received vaccine this month.  She denies any implanted devices.   Lives in ALF at [facility].   Review of Systems: Review of Systems    Unable to perform ROS: Dementia  Constitutional: Positive for fever.  Respiratory: Positive for shortness of breath. Negative for cough.   Genitourinary: Negative for dysuria.  Psychiatric/Behavioral: Positive for memory loss.  Please see HPI. All other systems reviewed and negative.   Past Medical History:  Diagnosis Date  . Aortic aneurysm, thoracic (HCC)    4.4 cm  . Aortic dilatation ([place])    Seen on cath 2005  . Arthritis   . CAD (coronary artery disease)    a. Nonobstructive by last cath in 05/2004 with 30% LAD. b. EF 65-70% 12/2010. c. Hx ASA intolerance.   . Colitis    12/2010 associated with transient lower GI bleeding/anemia tx with abx  . GERD (gastroesophageal reflux disease)    a. Minimal hiatal hernia on EGD 2006, no obvious esophageal source for CP at that time.  . Hypercholesteremia    Hx of myalgias felt to be statin related but taking Crestor as OP  . Hypertension   . Osteoporosis   . Stroke [facility])    TIA    Social History   Tobacco Use  . Smoking status: Former Smoker    Quit date: 04/26/1959    Years since quitting: 60.7  . Smokeless tobacco: Never Used  Substance Use Topics  . Alcohol use: No    Alcohol/week:  1.0 standard drinks    Types: 1 Glasses of wine per week  . Drug use: No    Family History  Problem Relation Age of Onset  . Heart disease Mother   . Heart failure Mother   . Arthritis Mother   . Heart disease Brother   . Heart disease Other   . Heart failure Other      Medications:  Scheduled: . clopidogrel  75 mg Oral Daily  . donepezil  10 mg Oral QHS  . enoxaparin (LOVENOX) injection  40 mg Subcutaneous Q24H  . famotidine  20 mg Oral BID  . memantine  10 mg Oral Daily    Abtx:  Anti-infectives (From admission, onward)   Start     Dose/Rate Route Frequency Ordered Stop   01/11/20 1400  ceFAZolin (ANCEF) IVPB 2g/100 mL premix     2 g 200 mL/hr over 30 Minutes Intravenous Every 8 hours 01/11/20 1303     01/11/20 0400   cefTRIAXone (ROCEPHIN) 2 g in sodium chloride 0.9 % 100 mL IVPB  Status:  Discontinued     2 g 200 mL/hr over 30 Minutes Intravenous Daily 01/11/20 0100 01/11/20 1305   01/11/20 0400  azithromycin (ZITHROMAX) 500 mg in sodium chloride 0.9 % 250 mL IVPB  Status:  Discontinued     500 mg 250 mL/hr over 60 Minutes Intravenous Daily 01/11/20 0100 01/11/20 1310   01/10/20 1945  aztreonam (AZACTAM) 1 g in sodium chloride 0.9 % 100 mL IVPB  Status:  Discontinued     1 g 200 mL/hr over 30 Minutes Intravenous  Once 01/10/20 1935 01/10/20 1943   01/10/20 1945  vancomycin (VANCOREADY) IVPB 1250 mg/250 mL     1,250 mg 166.7 mL/hr over 90 Minutes Intravenous STAT 01/10/20 1941 01/10/20 2324   01/10/20 1945  ceFEPIme (MAXIPIME) 2 g in sodium chloride 0.9 % 100 mL IVPB     2 g 200 mL/hr over 30 Minutes Intravenous STAT 01/10/20 1943 01/10/20 2114        OBJECTIVE: Blood pressure (!) 124/53, pulse 83, temperature 99.2 F (37.3 C), temperature source Oral, resp. rate (!) 26, weight 59 kg, SpO2 96 %.  Physical Exam Constitutional:      General: She is not in acute distress.    Appearance: She is not ill-appearing.  HENT:     Mouth/Throat:     Mouth: Mucous membranes are moist.     Pharynx: No oropharyngeal exudate.  Eyes:     Extraocular Movements: Extraocular movements intact.  Cardiovascular:     Rate and Rhythm: Normal rate and regular rhythm.  Pulmonary:     Effort: Pulmonary effort is normal.     Breath sounds: Normal breath sounds.  Abdominal:     General: Bowel sounds are normal. There is no distension.     Palpations: Abdomen is soft.     Tenderness: There is no abdominal tenderness.  Musculoskeletal:        General: No swelling.     Cervical back: Normal range of motion.     Right lower leg: No edema.     Left lower leg: No edema.  Neurological:     General: No focal deficit present.     Mental Status: She is alert.  Psychiatric:        Mood and Affect: Mood normal.      Lab Results Results for orders placed or performed during the hospital encounter of 01/10/20 (from the past 48 hour(s))  Lactic acid,  plasma     Status: None   Collection Time: 01/10/20  5:23 PM  Result Value Ref Range   Lactic Acid, Venous 1.9 0.5 - 1.9 mmol/L    Comment: Performed at [facility], [place] [address] [place], [place] [address]  Comprehensive metabolic panel     Status: Abnormal   Collection Time: 01/10/20  5:23 PM  Result Value Ref Range   Sodium 138 135 - 145 mmol/L   Potassium 3.2 (L) 3.5 - 5.1 mmol/L   Chloride 104 98 - 111 mmol/L   CO2 24 22 - 32 mmol/L   Glucose, Bld 179 (H) 70 - 99 mg/dL   BUN 12 8 - 23 mg/dL   Creatinine, Ser 0.62 0.44 - 1.00 mg/dL   Calcium 9.1 8.9 - 10.3 mg/dL   Total Protein 6.7 6.5 - 8.1 g/dL   Albumin 3.8 3.5 - 5.0 g/dL   AST 33 15 - 41 U/L   ALT 19 0 - 44 U/L   Alkaline Phosphatase 40 38 - 126 U/L   Total Bilirubin 1.1 0.3 - 1.2 mg/dL   GFR calc non Af Amer >60 >60 mL/min   GFR calc Af Amer >60 >60 mL/min   Anion gap 10 5 - 15    Comment: Performed at [facility], [place] [address], [place], [place] [address]  CBC WITH DIFFERENTIAL     Status: Abnormal   Collection Time: 01/10/20  5:23 PM  Result Value Ref Range   WBC 5.8 4.0 - 10.5 K/uL   RBC 4.32 3.87 - 5.11 MIL/uL   Hemoglobin 13.9 12.0 - 15.0 g/dL   HCT 43.4 36.0 - 46.0 %   MCV 100.5 (H) 80.0 - 100.0 fL   MCH 32.2 26.0 - 34.0 pg   MCHC 32.0 30.0 - 36.0 g/dL   RDW 12.5 11.5 - 15.5 %   Platelets 216 150 - 400 K/uL   nRBC 0.0 0.0 - 0.2 %   Neutrophils Relative % 75 %   Neutro Abs 4.4 1.7 - 7.7 K/uL   Lymphocytes Relative 9 %   Lymphs Abs 0.5 (L) 0.7 - 4.0 K/uL   Monocytes Relative 14 %   Monocytes Absolute 0.8 0.1 - 1.0 K/uL   Eosinophils Relative 0 %   Eosinophils Absolute 0.0 0.0 - 0.5 K/uL   Basophils Relative 1 %   Basophils Absolute 0.0 0.0 - 0.1 K/uL   Immature Granulocytes 1 %   Abs Immature Granulocytes 0.03 0.00 -  0.07 K/uL    Comment: Performed at [facility], [place] [address], [place], [place] [address]  APTT     Status: None   Collection Time: 01/10/20  5:23 PM  Result Value Ref Range   aPTT 27 24 - 36 seconds    Comment: Performed at [facility], [place] [address] [place], [place] [address]  Protime-INR     Status: None   Collection Time: 01/10/20  5:23 PM  Result Value Ref Range   Prothrombin Time 13.4 11.4 - 15.2 seconds   INR 1.0 0.8 - 1.2    Comment: (NOTE) INR goal varies based on device and disease states. Performed at [facility], [place] [address] [place], [place] [address]   Blood Culture (routine x 2)     Status: None (Preliminary result)   Collection Time: 01/10/20  5:23 PM   Specimen: BLOOD LEFT ARM  Result Value Ref Range   Specimen Description  BLOOD LEFT ARM Performed at [facility], [place] [address], [place], [place] [address]    Special Requests      BOTTLES DRAWN AEROBIC AND ANAEROBIC Blood Culture adequate volume Performed at [place] [address] [place], [place] [address]    Culture  Setup Time      GRAM POSITIVE COCCI ANAEROBIC BOTTLE ONLY CRITICAL RESULT CALLED TO, READ BACK BY AND VERIFIED WITH: A JACKSON [id number] AT 1024 AM BY CM Performed at [facility], [place] [address], [place], [place] [address]    Culture GRAM POSITIVE COCCI    Report Status PENDING   Respiratory Panel by RT PCR (Flu A&B, Covid) - Nasopharyngeal Swab     Status: None   Collection Time: 01/10/20  5:24 PM   Specimen: Nasopharyngeal Swab  Result Value Ref Range   SARS Coronavirus 2 by RT PCR NEGATIVE NEGATIVE    Comment: (NOTE) SARS-CoV-2 target nucleic acids are NOT DETECTED. The SARS-CoV-2 RNA is generally detectable in upper respiratoy specimens during the acute phase of infection. The lowest concentration of SARS-CoV-2 viral copies this assay can detect is 131 copies/mL. A negative  result does not preclude SARS-Cov-2 infection and should not be used as the sole basis for treatment or other patient management decisions. A negative result may occur with  improper specimen collection/handling, submission of specimen other than nasopharyngeal swab, presence of viral mutation(s) within the areas targeted by this assay, and inadequate number of viral copies (<131 copies/mL). A negative result must be combined with clinical observations, patient history, and epidemiological information. The expected result is Negative. Fact Sheet for Patients:  PinkCheek.be Fact Sheet for Healthcare Providers:  GravelBags.it This test is not yet ap proved or cleared by the [place] FDA and  has been authorized for detection and/or diagnosis of SARS-CoV-2 by FDA under an Emergency Use Authorization (EUA). This EUA will remain  in effect (meaning this test can be used) for the duration of the COVID-19 declaration under Section 564(b)(1) of the Act, 21 U.S.C. section 360bbb-3(b)(1), unless the authorization is terminated or revoked sooner.    Influenza A by PCR NEGATIVE NEGATIVE   Influenza B by PCR NEGATIVE NEGATIVE    Comment: (NOTE) The Xpert Xpress SARS-CoV-2/FLU/RSV assay is intended as an aid in  the diagnosis of influenza from Nasopharyngeal swab specimens and  should not be used as a sole basis for treatment. Nasal washings and  aspirates are unacceptable for Xpert Xpress SARS-CoV-2/FLU/RSV  testing. Fact Sheet for Patients: PinkCheek.be Fact Sheet for Healthcare Providers: GravelBags.it This test is not yet approved or cleared by the [place] FDA and  has been authorized for detection and/or diagnosis of SARS-CoV-2 by  FDA under an Emergency Use Authorization (EUA). This EUA will remain  in effect (meaning this test can be used) for the duration of the   Covid-19 declaration under Section 564(b)(1) of the Act, 21  U.S.C. section 360bbb-3(b)(1), unless the authorization is  terminated or revoked. Performed at [facility], [place] [address], [place], [place] [address]   Blood Culture (routine x 2)     Status: None (Preliminary result)   Collection Time: 01/10/20  5:28 PM   Specimen: BLOOD LEFT HAND  Result Value Ref Range   Specimen Description      BLOOD LEFT HAND Performed at [place] [address], [place], [place] [address]    Special Requests      BOTTLES DRAWN AEROBIC  AND ANAEROBIC Blood Culture adequate volume Performed at [place] [address], [place], [place] [address]    Culture  Setup Time      GRAM POSITIVE COCCI IN BOTH AEROBIC AND ANAEROBIC BOTTLES Organism ID to follow CRITICAL VALUE NOTED.  VALUE IS CONSISTENT WITH PREVIOUSLY REPORTED AND CALLED VALUE. Performed at [facility], [place] [address] [place], [place] [address]    Culture GRAM POSITIVE COCCI    Report Status PENDING   Blood Culture ID Panel (Reflexed)     Status: Abnormal   Collection Time: 01/10/20  5:28 PM  Result Value Ref Range   Enterococcus species NOT DETECTED NOT DETECTED   Listeria monocytogenes NOT DETECTED NOT DETECTED   Staphylococcus species DETECTED (A) NOT DETECTED    Comment: CRITICAL RESULT CALLED TO, READ BACK BY AND VERIFIED WITH: Seleta Rhymes PharmD 12:30 01/11/20 (wilsonm)    Staphylococcus aureus (BCID) DETECTED (A) NOT DETECTED    Comment: Methicillin (oxacillin) susceptible Staphylococcus aureus (MSSA). Preferred therapy is anti staphylococcal beta lactam antibiotic (Cefazolin or Nafcillin), unless clinically contraindicated. CRITICAL RESULT CALLED TO, READ BACK BY AND VERIFIED WITH: Seleta Rhymes PharmD 12:30 01/11/20 (wilsonm)    Methicillin resistance NOT DETECTED NOT DETECTED   Streptococcus species NOT DETECTED NOT DETECTED   Streptococcus agalactiae  NOT DETECTED NOT DETECTED   Streptococcus pneumoniae NOT DETECTED NOT DETECTED   Streptococcus pyogenes NOT DETECTED NOT DETECTED   Acinetobacter baumannii NOT DETECTED NOT DETECTED   Enterobacteriaceae species NOT DETECTED NOT DETECTED   Enterobacter cloacae complex NOT DETECTED NOT DETECTED   Escherichia coli NOT DETECTED NOT DETECTED   Klebsiella oxytoca NOT DETECTED NOT DETECTED   Klebsiella pneumoniae NOT DETECTED NOT DETECTED   Proteus species NOT DETECTED NOT DETECTED   Serratia marcescens NOT DETECTED NOT DETECTED   Haemophilus influenzae NOT DETECTED NOT DETECTED   Neisseria meningitidis NOT DETECTED NOT DETECTED   Pseudomonas aeruginosa NOT DETECTED NOT DETECTED   Candida albicans NOT DETECTED NOT DETECTED   Candida glabrata NOT DETECTED NOT DETECTED   Candida krusei NOT DETECTED NOT DETECTED   Candida parapsilosis NOT DETECTED NOT DETECTED   Candida tropicalis NOT DETECTED NOT DETECTED    Comment: Performed at [facility], [address] [place], [place] [address]  Lactic acid, plasma     Status: None   Collection Time: 01/10/20  9:52 PM  Result Value Ref Range   Lactic Acid, Venous 1.0 0.5 - 1.9 mmol/L    Comment: Performed at [facility], [place] [address], [place], [place] [address]  CBC     Status: Abnormal   Collection Time: 01/11/20  1:00 AM  Result Value Ref Range   WBC 4.7 4.0 - 10.5 K/uL   RBC 3.34 (L) 3.87 - 5.11 MIL/uL   Hemoglobin 10.9 (L) 12.0 - 15.0 g/dL   HCT 34.4 (L) 36.0 - 46.0 %   MCV 103.0 (H) 80.0 - 100.0 fL   MCH 32.6 26.0 - 34.0 pg   MCHC 31.7 30.0 - 36.0 g/dL   RDW 12.9 11.5 - 15.5 %   Platelets 159 150 - 400 K/uL   nRBC 0.0 0.0 - 0.2 %    Comment: Performed at [facility], [place] [address] [place], [place] [address]  Creatinine, serum     Status: None   Collection Time: 01/11/20  1:00 AM  Result Value Ref Range   Creatinine, Ser 0.62 0.44 - 1.00 mg/dL   GFR calc non Af  Amer >60 >60 mL/min   GFR  calc Af Amer >60 >60 mL/min    Comment: Performed at [facility], [place] [address], [place], [place] [address]  Comprehensive metabolic panel     Status: Abnormal   Collection Time: 01/11/20  6:22 AM  Result Value Ref Range   Sodium 142 135 - 145 mmol/L   Potassium 3.3 (L) 3.5 - 5.1 mmol/L   Chloride 104 98 - 111 mmol/L   CO2 27 22 - 32 mmol/L   Glucose, Bld 113 (H) 70 - 99 mg/dL   BUN 14 8 - 23 mg/dL   Creatinine, Ser 0.49 0.44 - 1.00 mg/dL   Calcium 8.2 (L) 8.9 - 10.3 mg/dL   Total Protein 5.8 (L) 6.5 - 8.1 g/dL   Albumin 3.2 (L) 3.5 - 5.0 g/dL   AST 27 15 - 41 U/L   ALT 19 0 - 44 U/L   Alkaline Phosphatase 33 (L) 38 - 126 U/L   Total Bilirubin 0.9 0.3 - 1.2 mg/dL   GFR calc non Af Amer >60 >60 mL/min   GFR calc Af Amer >60 >60 mL/min   Anion gap 11 5 - 15    Comment: Performed at [facility], [place] [address], [place], [place] [address]  CBC with Differential/Platelet     Status: Abnormal   Collection Time: 01/11/20  6:22 AM  Result Value Ref Range   WBC 6.5 4.0 - 10.5 K/uL   RBC 3.76 (L) 3.87 - 5.11 MIL/uL   Hemoglobin 12.3 12.0 - 15.0 g/dL   HCT 38.7 36.0 - 46.0 %   MCV 102.9 (H) 80.0 - 100.0 fL   MCH 32.7 26.0 - 34.0 pg   MCHC 31.8 30.0 - 36.0 g/dL   RDW 13.1 11.5 - 15.5 %   Platelets 176 150 - 400 K/uL   nRBC 0.0 0.0 - 0.2 %   Neutrophils Relative % 72 %   Neutro Abs 4.8 1.7 - 7.7 K/uL   Lymphocytes Relative 11 %   Lymphs Abs 0.7 0.7 - 4.0 K/uL   Monocytes Relative 14 %   Monocytes Absolute 0.9 0.1 - 1.0 K/uL   Eosinophils Relative 2 %   Eosinophils Absolute 0.1 0.0 - 0.5 K/uL   Basophils Relative 1 %   Basophils Absolute 0.0 0.0 - 0.1 K/uL   Immature Granulocytes 0 %   Abs Immature Granulocytes 0.02 0.00 - 0.07 K/uL    Comment: Performed at [facility], [place] [address], [place], [place] [address]  Procalcitonin - Baseline     Status: None   Collection Time: 01/11/20  6:22 AM  Result Value Ref Range    Procalcitonin 0.10 ng/mL    Comment:        Interpretation: PCT (Procalcitonin) <= 0.5 ng/mL: Systemic infection (sepsis) is not likely. Local bacterial infection is possible. (NOTE)       Sepsis PCT Algorithm           Lower Respiratory Tract                                      Infection PCT Algorithm    ----------------------------     ----------------------------         PCT < 0.25 ng/mL                PCT < 0.10 ng/mL  Strongly encourage             Strongly discourage   discontinuation of antibiotics    initiation of antibiotics    ----------------------------     -----------------------------       PCT 0.25 - 0.50 ng/mL            PCT 0.10 - 0.25 ng/mL               OR       >80% decrease in PCT            Discourage initiation of                                            antibiotics      Encourage discontinuation           of antibiotics    ----------------------------     -----------------------------         PCT >= 0.50 ng/mL              PCT 0.26 - 0.50 ng/mL               AND        <80% decrease in PCT             Encourage initiation of                                             antibiotics       Encourage continuation           of antibiotics    ----------------------------     -----------------------------        PCT >= 0.50 ng/mL                  PCT > 0.50 ng/mL               AND         increase in PCT                  Strongly encourage                                      initiation of antibiotics    Strongly encourage escalation           of antibiotics                                     -----------------------------                                           PCT <= 0.25 ng/mL                                                 OR                                        >   80% decrease in PCT                                     Discontinue / Do not initiate                                             antibiotics Performed at [place] [address], [place], [place] [address]   D-dimer, quantitative (not at [facility])     Status: Abnormal   Collection Time: 01/11/20  6:22 AM  Result Value Ref Range   D-Dimer, Quant 2.44 (H) 0.00 - 0.50 ug/mL-FEU    Comment: (NOTE) At the manufacturer cut-off of 0.50 ug/mL FEU, this assay has been documented to exclude PE with a sensitivity and negative predictive value of 97 to 99%.  At this time, this assay has not been approved by the FDA to exclude DVT/VTE. Results should be correlated with clinical presentation. Performed at [facility], [place] [address], [place], [place] [address]   Troponin I (High Sensitivity)     Status: None   Collection Time: 01/11/20  6:22 AM  Result Value Ref Range   Troponin I (High Sensitivity) 12 <18 ng/L    Comment: (NOTE) Elevated high sensitivity troponin I (hsTnI) values and significant  changes across serial measurements may suggest ACS but many other  chronic and acute conditions are known to elevate hsTnI results.  Refer to the "Links" section for chest pain algorithms and additional  guidance. Performed at [facility], [place] [address] [place], [place] [address]   Lactic acid, plasma     Status: None   Collection Time: 01/11/20  6:23 AM  Result Value Ref Range   Lactic Acid, Venous 0.8 0.5 - 1.9 mmol/L    Comment: Performed at [facility], [place] [address] [place], [place] [address]  Hemoglobin A1c     Status: None   Collection Time: 01/11/20  6:27 AM  Result Value Ref Range   Hgb A1c MFr Bld 5.6 4.8 - 5.6 %    Comment: (NOTE) Pre diabetes:          5.7%-6.4% Diabetes:              >6.4% Glycemic control for   <7.0% adults with diabetes    Mean Plasma Glucose 114.02 mg/dL    Comment: Performed at [place] [address], [place], [place] [address]      Component Value Date/Time   SDES  01/10/2020 1728    BLOOD LEFT HAND Performed at [facility], [place] [address], [place], [place] [address]    [place]  01/10/2020 1728    BOTTLES DRAWN AEROBIC AND ANAEROBIC Blood Culture adequate volume Performed at [place] [address], [place], [place] [address]    [place] Dose POSITIVE COCCI 01/10/2020 1728   REPTSTATUS PENDING 01/10/2020 1728   CT HEAD WO CONTRAST  Result Date: 01/11/2020 CLINICAL DATA:  Ataxia. EXAM: CT HEAD WITHOUT CONTRAST TECHNIQUE: Contiguous axial images were obtained from the base of the skull through the vertex without intravenous contrast. COMPARISON:  March 02, 2019. FINDINGS: Brain: Mild diffuse cortical atrophy is noted. Mild chronic ischemic white matter disease is noted. No mass effect or midline shift  is noted. Ventricular size is within normal limits. There is no evidence of mass lesion, hemorrhage or acute infarction. Vascular: No hyperdense vessel or unexpected calcification. Skull: Normal. Negative for fracture or focal lesion. Sinuses/Orbits: No acute finding. Other: None. IMPRESSION: Mild diffuse cortical atrophy. Mild chronic ischemic white matter disease. No acute intracranial abnormality seen. Electronically Signed   By: Marijo Conception M.D.   On: 01/11/2020 07:37   DG Chest Port 1 View  Result Date: 01/10/2020 CLINICAL DATA:  Hypoxia. EXAM: PORTABLE CHEST 1 VIEW COMPARISON:  January 10, 2020 FINDINGS: Minimal atelectasis in the left base. The heart, hila, mediastinum, lungs, and pleura are otherwise unremarkable. IMPRESSION: Minimal atelectasis in the left base.  No other acute abnormalities. Electronically Signed   By: Dorise Bullion III M.D   On: 01/10/2020 18:03   CT RENAL STONE STUDY  Result Date: 01/11/2020 CLINICAL DATA:  Fever and confusion EXAM: CT ABDOMEN AND PELVIS WITHOUT CONTRAST TECHNIQUE: Multidetector CT imaging of the abdomen and pelvis was performed following the standard protocol without IV contrast. COMPARISON:  05/10/2015 FINDINGS: Lower chest:  Coronary atherosclerosis. Mitral annular calcification. Atelectasis in the lower lungs with bronchiectasis in airways at the right anterior base. Hepatobiliary: No focal liver abnormality.No evidence of biliary obstruction or stone. Pancreas: Unremarkable. Spleen: Generalized atrophy. Adrenals/Urinary Tract: Negative adrenals. No hydronephrosis or ureteral stone. Small cystic density in the left kidney. Mild excavation/scar-like appearance of the right kidney. Small calcification at the left renal hilum appears vascular on reformats. Unremarkable bladder. Stomach/Bowel:  No obstruction. No appendicitis. Vascular/Lymphatic: No acute vascular abnormality. No mass or adenopathy. Reproductive:Hysterectomy Other: No ascites or pneumoperitoneum. Musculoskeletal: L1 compression fracture with moderate height loss. Anteriorly a fracture plane is still seen but this has an overall chronic appearance. Remote T12 and L3 superior endplate fractures. Laminectomy at the thoracolumbar junction with spinal catheter fragment. L5-S1 anterolisthesis. IMPRESSION: 1. No acute finding.  No evident urinary tract infection. 2. Nonacute appearing compression fractures at T12, L1, and L3 that are new since 2016. 3. Multiple incidental findings noted above. Electronically Signed   By: Monte Fantasia M.D.   On: 01/11/2020 07:51   Recent Results (from the past 240 hour(s))  Blood Culture (routine x 2)     Status: None (Preliminary result)   Collection Time: 01/10/20  5:23 PM   Specimen: BLOOD LEFT ARM  Result Value Ref Range Status   Specimen Description   Final    BLOOD LEFT ARM Performed at [facility], [address], [place], [place] [address]    Special Requests   Final    BOTTLES DRAWN AEROBIC AND ANAEROBIC Blood Culture adequate volume Performed at [facility] [address] [place], [place] [address]    Culture  Setup Time   Final    GRAM POSITIVE COCCI ANAEROBIC BOTTLE ONLY CRITICAL RESULT  CALLED TO, READ BACK BY AND VERIFIED WITH: A JACKSON [id number] AT 1024 AM BY CM Performed at [facility], [place] [address], [place], [place] [address]    Culture GRAM POSITIVE COCCI  Final   Report Status PENDING  Incomplete  Respiratory Panel by RT PCR (Flu A&B, Covid) - Nasopharyngeal Swab     Status: None   Collection Time: 01/10/20  5:24 PM   Specimen: Nasopharyngeal Swab  Result Value Ref Range Status   SARS Coronavirus 2 by RT PCR NEGATIVE NEGATIVE Final    Comment: (NOTE) SARS-CoV-2 target nucleic acids are NOT DETECTED. The SARS-CoV-2 RNA is generally  detectable in upper respiratoy specimens during the acute phase of infection. The lowest concentration of SARS-CoV-2 viral copies this assay can detect is 131 copies/mL. A negative result does not preclude SARS-Cov-2 infection and should not be used as the sole basis for treatment or other patient management decisions. A negative result may occur with  improper specimen collection/handling, submission of specimen other than nasopharyngeal swab, presence of viral mutation(s) within the areas targeted by this assay, and inadequate number of viral copies (<131 copies/mL). A negative result must be combined with clinical observations, patient history, and epidemiological information. The expected result is Negative. Fact Sheet for Patients:  PinkCheek.be Fact Sheet for Healthcare Providers:  GravelBags.it This test is not yet ap proved or cleared by the [place] FDA and  has been authorized for detection and/or diagnosis of SARS-CoV-2 by FDA under an Emergency Use Authorization (EUA). This EUA will remain  in effect (meaning this test can be used) for the duration of the COVID-19 declaration under Section 564(b)(1) of the Act, 21 U.S.C. section 360bbb-3(b)(1), unless the authorization is terminated or revoked sooner.    Influenza A by PCR NEGATIVE NEGATIVE Final    Influenza B by PCR NEGATIVE NEGATIVE Final    Comment: (NOTE) The Xpert Xpress SARS-CoV-2/FLU/RSV assay is intended as an aid in  the diagnosis of influenza from Nasopharyngeal swab specimens and  should not be used as a sole basis for treatment. Nasal washings and  aspirates are unacceptable for Xpert Xpress SARS-CoV-2/FLU/RSV  testing. Fact Sheet for Patients: PinkCheek.be Fact Sheet for Healthcare Providers: GravelBags.it This test is not yet approved or cleared by the [place] FDA and  has been authorized for detection and/or diagnosis of SARS-CoV-2 by  FDA under an Emergency Use Authorization (EUA). This EUA will remain  in effect (meaning this test can be used) for the duration of the  Covid-19 declaration under Section 564(b)(1) of the Act, 21  U.S.C. section 360bbb-3(b)(1), unless the authorization is  terminated or revoked. Performed at [facility], [place] [address] [place], [place] [address]   Blood Culture (routine x 2)     Status: None (Preliminary result)   Collection Time: 01/10/20  5:28 PM   Specimen: BLOOD LEFT HAND  Result Value Ref Range Status   Specimen Description   Final    BLOOD LEFT HAND Performed at [facility] [address], [place], [place] [address]    Special Requests   Final    BOTTLES DRAWN AEROBIC AND ANAEROBIC Blood Culture adequate volume Performed at [facility] [address], [place], [place] [address]    Culture  Setup Time   Final    GRAM POSITIVE COCCI IN BOTH AEROBIC AND ANAEROBIC BOTTLES Organism ID to follow CRITICAL VALUE NOTED.  VALUE IS CONSISTENT WITH PREVIOUSLY REPORTED AND CALLED VALUE. Performed at [facility], [place] [address] [place], [place] [address]    Culture GRAM POSITIVE COCCI  Final   Report Status PENDING  Incomplete  Blood Culture ID Panel (Reflexed)     Status: Abnormal   Collection  Time: 01/10/20  5:28 PM  Result Value Ref Range Status   Enterococcus species NOT DETECTED NOT DETECTED Final   Listeria monocytogenes NOT DETECTED NOT DETECTED Final   Staphylococcus species DETECTED (A) NOT DETECTED Final    Comment: CRITICAL RESULT CALLED TO, READ BACK BY AND VERIFIED WITH: Seleta Rhymes PharmD 12:30 01/11/20 ([email or web address])    Staphylococcus aureus (BCID) DETECTED (A) NOT  DETECTED Final    Comment: Methicillin (oxacillin) susceptible Staphylococcus aureus (MSSA). Preferred therapy is anti staphylococcal beta lactam antibiotic (Cefazolin or Nafcillin), unless clinically contraindicated. CRITICAL RESULT CALLED TO, READ BACK BY AND VERIFIED WITH: Seleta Rhymes PharmD 12:30 01/11/20 ([email or web address])    Methicillin resistance NOT DETECTED NOT DETECTED Final   Streptococcus species NOT DETECTED NOT DETECTED Final   Streptococcus agalactiae NOT DETECTED NOT DETECTED Final   Streptococcus pneumoniae NOT DETECTED NOT DETECTED Final   Streptococcus pyogenes NOT DETECTED NOT DETECTED Final   Acinetobacter baumannii NOT DETECTED NOT DETECTED Final   Enterobacteriaceae species NOT DETECTED NOT DETECTED Final   Enterobacter cloacae complex NOT DETECTED NOT DETECTED Final   Escherichia coli NOT DETECTED NOT DETECTED Final   Klebsiella oxytoca NOT DETECTED NOT DETECTED Final   Klebsiella pneumoniae NOT DETECTED NOT DETECTED Final   Proteus species NOT DETECTED NOT DETECTED Final   Serratia marcescens NOT DETECTED NOT DETECTED Final   Haemophilus influenzae NOT DETECTED NOT DETECTED Final   Neisseria meningitidis NOT DETECTED NOT DETECTED Final   Pseudomonas aeruginosa NOT DETECTED NOT DETECTED Final   Candida albicans NOT DETECTED NOT DETECTED Final   Candida glabrata NOT DETECTED NOT DETECTED Final   Candida krusei NOT DETECTED NOT DETECTED Final   Candida parapsilosis NOT DETECTED NOT DETECTED Final   Candida tropicalis NOT DETECTED NOT DETECTED Final    Comment: Performed at [facility], [place]. [address] [place], [place] [address]    Microbiology: Recent Results (from the past 240 hour(s))  Blood Culture (routine x 2)     Status: None (Preliminary result)   Collection Time: 01/10/20  5:23 PM   Specimen: BLOOD LEFT ARM  Result Value Ref Range Status   Specimen Description   Final    BLOOD LEFT ARM Performed at [facility], [address] [place], [place] [address]    Special Requests   Final    BOTTLES DRAWN AEROBIC AND ANAEROBIC Blood Culture adequate volume Performed at [facility] [address] [place], [place] [address]    Culture  Setup Time   Final    GRAM POSITIVE COCCI ANAEROBIC BOTTLE ONLY CRITICAL RESULT CALLED TO, READ BACK BY AND VERIFIED WITH: A JACKSON [id number] AT 1024 AM BY CM Performed at [facility], [place] [address] [place], [place] [address]    Culture GRAM POSITIVE COCCI  Final   Report Status PENDING  Incomplete  Respiratory Panel by RT PCR (Flu A&B, Covid) - Nasopharyngeal Swab     Status: None   Collection Time: 01/10/20  5:24 PM   Specimen: Nasopharyngeal Swab  Result Value Ref Range Status   SARS Coronavirus 2 by RT PCR NEGATIVE NEGATIVE Final    Comment: (NOTE) SARS-CoV-2 target nucleic acids are NOT DETECTED. The SARS-CoV-2 RNA is generally detectable in upper respiratoy specimens during the acute phase of infection. The lowest concentration of SARS-CoV-2 viral copies this assay can detect is 131 copies/mL. A negative result does not preclude SARS-Cov-2 infection and should not be used as the sole basis for treatment or other patient management decisions. A negative result may occur with  improper specimen collection/handling, submission of specimen other than nasopharyngeal swab, presence of viral mutation(s) within the areas targeted by this assay, and inadequate number of viral copies (<131 copies/mL). A negative result must be combined with clinical observations, patient history, and  epidemiological information. The expected result is Negative. Fact Sheet for Patients:  PinkCheek.be Fact Sheet for  Healthcare Providers:  GravelBags.it This test is not yet ap proved or cleared by the [place] and  has been authorized for detection and/or diagnosis of SARS-CoV-2 by FDA under an Emergency Use Authorization (EUA). This EUA will remain  in effect (meaning this test can be used) for the duration of the COVID-19 declaration under Section 564(b)(1) of the Act, 21 U.S.C. section 360bbb-3(b)(1), unless the authorization is terminated or revoked sooner.    Influenza A by PCR NEGATIVE NEGATIVE Final   Influenza B by PCR NEGATIVE NEGATIVE Final    Comment: (NOTE) The Xpert Xpress SARS-CoV-2/FLU/RSV assay is intended as an aid in  the diagnosis of influenza from Nasopharyngeal swab specimens and  should not be used as a sole basis for treatment. Nasal washings and  aspirates are unacceptable for Xpert Xpress SARS-CoV-2/FLU/RSV  testing. Fact Sheet for Patients: PinkCheek.be Fact Sheet for Healthcare Providers: GravelBags.it This test is not yet approved or cleared by the [place] FDA and  has been authorized for detection and/or diagnosis of SARS-CoV-2 by  FDA under an Emergency Use Authorization (EUA). This EUA will remain  in effect (meaning this test can be used) for the duration of the  Covid-19 declaration under Section 564(b)(1) of the Act, 21  U.S.C. section 360bbb-3(b)(1), unless the authorization is  terminated or revoked. Performed at [facility], [place] [address] [place], [place] [address]   Blood Culture (routine x 2)     Status: None (Preliminary result)   Collection Time: 01/10/20  5:28 PM   Specimen: BLOOD LEFT HAND  Result Value Ref Range Status   Specimen Description   Final    BLOOD LEFT HAND Performed at  [facility] [address], [place], [place] [address]    Special Requests   Final    BOTTLES DRAWN AEROBIC AND ANAEROBIC Blood Culture adequate volume Performed at [facility] [address], [place], [place] [address]    Culture  Setup Time   Final    GRAM POSITIVE COCCI IN BOTH AEROBIC AND ANAEROBIC BOTTLES Organism ID to follow CRITICAL VALUE NOTED.  VALUE IS CONSISTENT WITH PREVIOUSLY REPORTED AND CALLED VALUE. Performed at [facility], [place] [address] [place], [place] [address]    Culture GRAM POSITIVE COCCI  Final   Report Status PENDING  Incomplete  Blood Culture ID Panel (Reflexed)     Status: Abnormal   Collection Time: 01/10/20  5:28 PM  Result Value Ref Range Status   Enterococcus species NOT DETECTED NOT DETECTED Final   Listeria monocytogenes NOT DETECTED NOT DETECTED Final   Staphylococcus species DETECTED (A) NOT DETECTED Final    Comment: CRITICAL RESULT CALLED TO, READ BACK BY AND VERIFIED WITH: Seleta Rhymes PharmD 12:30 01/11/20 ([email or web address])    Staphylococcus aureus (BCID) DETECTED (A) NOT DETECTED Final    Comment: Methicillin (oxacillin) susceptible Staphylococcus aureus (MSSA). Preferred therapy is anti staphylococcal beta lactam antibiotic (Cefazolin or Nafcillin), unless clinically contraindicated. CRITICAL RESULT CALLED TO, READ BACK BY AND VERIFIED WITH: Seleta Rhymes PharmD 12:30 01/11/20 ([email or web address])    Methicillin resistance NOT DETECTED NOT DETECTED Final   Streptococcus species NOT DETECTED NOT DETECTED Final   Streptococcus agalactiae NOT DETECTED NOT DETECTED Final   Streptococcus pneumoniae NOT DETECTED NOT DETECTED Final   Streptococcus pyogenes NOT DETECTED NOT DETECTED Final   Acinetobacter baumannii NOT DETECTED NOT DETECTED Final   Enterobacteriaceae species NOT DETECTED NOT DETECTED Final   Enterobacter cloacae complex NOT DETECTED NOT  DETECTED Final   Escherichia coli NOT DETECTED NOT DETECTED Final    Klebsiella oxytoca NOT DETECTED NOT DETECTED Final   Klebsiella pneumoniae NOT DETECTED NOT DETECTED Final   Proteus species NOT DETECTED NOT DETECTED Final   Serratia marcescens NOT DETECTED NOT DETECTED Final   Haemophilus influenzae NOT DETECTED NOT DETECTED Final   Neisseria meningitidis NOT DETECTED NOT DETECTED Final   Pseudomonas aeruginosa NOT DETECTED NOT DETECTED Final   Candida albicans NOT DETECTED NOT DETECTED Final   Candida glabrata NOT DETECTED NOT DETECTED Final   Candida krusei NOT DETECTED NOT DETECTED Final   Candida parapsilosis NOT DETECTED NOT DETECTED Final   Candida tropicalis NOT DETECTED NOT DETECTED Final    Comment: Performed at [facility], [place] [address], [place], [place] [address]    Radiographs and labs were personally reviewed by me.   Bobby Rumpf, MD [facility] [phone] 01/11/2020, 2:54 PM

## 2020-01-11 NOTE — ED Notes (Signed)
Patient transported to CT 

## 2020-01-11 NOTE — ED Notes (Addendum)
Patient given meal tray at this time.

## 2020-01-12 ENCOUNTER — Inpatient Hospital Stay (HOSPITAL_COMMUNITY): Payer: PPO

## 2020-01-12 DIAGNOSIS — M4856XA Collapsed vertebra, not elsewhere classified, lumbar region, initial encounter for fracture: Secondary | ICD-10-CM

## 2020-01-12 DIAGNOSIS — G301 Alzheimer's disease with late onset: Secondary | ICD-10-CM

## 2020-01-12 DIAGNOSIS — R011 Cardiac murmur, unspecified: Secondary | ICD-10-CM

## 2020-01-12 DIAGNOSIS — I340 Nonrheumatic mitral (valve) insufficiency: Secondary | ICD-10-CM

## 2020-01-12 DIAGNOSIS — M4854XA Collapsed vertebra, not elsewhere classified, thoracic region, initial encounter for fracture: Secondary | ICD-10-CM

## 2020-01-12 DIAGNOSIS — I361 Nonrheumatic tricuspid (valve) insufficiency: Secondary | ICD-10-CM

## 2020-01-12 DIAGNOSIS — A4101 Sepsis due to Methicillin susceptible Staphylococcus aureus: Secondary | ICD-10-CM

## 2020-01-12 DIAGNOSIS — F0280 Dementia in other diseases classified elsewhere without behavioral disturbance: Secondary | ICD-10-CM

## 2020-01-12 LAB — BASIC METABOLIC PANEL
Anion gap: 8 (ref 5–15)
BUN: 16 mg/dL (ref 8–23)
CO2: 28 mmol/L (ref 22–32)
Calcium: 8.8 mg/dL — ABNORMAL LOW (ref 8.9–10.3)
Chloride: 106 mmol/L (ref 98–111)
Creatinine, Ser: 0.56 mg/dL (ref 0.44–1.00)
GFR calc Af Amer: >60 mL/min (ref >60)
GFR calc non Af Amer: >60 mL/min (ref >60)
Glucose, Bld: 108 mg/dL — ABNORMAL HIGH (ref 70–99)
Potassium: 3.4 mmol/L — ABNORMAL LOW (ref 3.5–5.1)
Sodium: 142 mmol/L (ref 135–145)

## 2020-01-12 LAB — CBC
HCT: 36.8 % (ref 36.0–46.0)
Hemoglobin: 11.5 g/dL — ABNORMAL LOW (ref 12.0–15.0)
MCH: 31.9 pg (ref 26.0–34.0)
MCHC: 31.3 g/dL (ref 30.0–36.0)
MCV: 101.9 fL — ABNORMAL HIGH (ref 80.0–100.0)
Platelets: 187 10*3/uL (ref 150–400)
RBC: 3.61 MIL/uL — ABNORMAL LOW (ref 3.87–5.11)
RDW: 12.8 % (ref 11.5–15.5)
WBC: 5.7 10*3/uL (ref 4.0–10.5)
nRBC: 0 % (ref 0.0–0.2)

## 2020-01-12 MED ORDER — POTASSIUM CHLORIDE CRYS ER 20 MEQ PO TBCR
40.0000 meq | EXTENDED_RELEASE_TABLET | Freq: Once | ORAL | Status: AC
  Administered 2020-01-12: 40 meq via ORAL
  Filled 2020-01-12: qty 2

## 2020-01-12 MED ORDER — ADULT MULTIVITAMIN W/MINERALS CH
1.0000 | ORAL_TABLET | Freq: Every day | ORAL | Status: DC
  Administered 2020-01-12 – 2020-01-14 (×3): 1 via ORAL
  Filled 2020-01-12 (×3): qty 1

## 2020-01-12 MED ORDER — ENSURE ENLIVE PO LIQD
237.0000 mL | Freq: Two times a day (BID) | ORAL | Status: DC
  Administered 2020-01-12: 237 mL via ORAL

## 2020-01-12 NOTE — Plan of Care (Signed)
Progressing

## 2020-01-12 NOTE — Progress Notes (Signed)
INFECTIOUS DISEASE PROGRESS NOTE  ID: [name] is a 86 y.o. female with  Principal Problem:   Sepsis ([place]) Active Problems:   CAD (coronary artery disease)   Aortic aneurysm, thoracic ([place])   HTN (hypertension)   Alzheimer's disease ([place])   CAP (community acquired pneumonia)  Subjective: Unclear if she has had BRBPR or BM  Abtx:  Anti-infectives (From admission, onward)   Start     Dose/Rate Route Frequency Ordered Stop   01/11/20 1400  ceFAZolin (ANCEF) IVPB 2g/100 mL premix     2 g 200 mL/hr over 30 Minutes Intravenous Every 8 hours 01/11/20 1303     01/11/20 0400  cefTRIAXone (ROCEPHIN) 2 g in sodium chloride 0.9 % 100 mL IVPB  Status:  Discontinued     2 g 200 mL/hr over 30 Minutes Intravenous Daily 01/11/20 0100 01/11/20 1305   01/11/20 0400  azithromycin (ZITHROMAX) 500 mg in sodium chloride 0.9 % 250 mL IVPB  Status:  Discontinued     500 mg 250 mL/hr over 60 Minutes Intravenous Daily 01/11/20 0100 01/11/20 1310   01/10/20 1945  aztreonam (AZACTAM) 1 g in sodium chloride 0.9 % 100 mL IVPB  Status:  Discontinued     1 g 200 mL/hr over 30 Minutes Intravenous  Once 01/10/20 1935 01/10/20 1943   01/10/20 1945  vancomycin (VANCOREADY) IVPB 1250 mg/250 mL     1,250 mg 166.7 mL/hr over 90 Minutes Intravenous STAT 01/10/20 1941 01/10/20 2324   01/10/20 1945  ceFEPIme (MAXIPIME) 2 g in sodium chloride 0.9 % 100 mL IVPB     2 g 200 mL/hr over 30 Minutes Intravenous STAT 01/10/20 1943 01/10/20 2114      Medications:  Scheduled: . clopidogrel  75 mg Oral Daily  . donepezil  10 mg Oral QHS  . enoxaparin (LOVENOX) injection  40 mg Subcutaneous Q24H  . famotidine  20 mg Oral BID  . memantine  10 mg Oral Daily  . potassium chloride  40 mEq Oral Once    Objective: Vital signs in last 24 hours: Temp:  [98 F (36.7 C)-98.6 F (37 C)] 98.6 F (37 C) (01/12 0604) Pulse Rate:  [63-101] 71 (01/12 0604) Resp:  [15-28] 16 (01/12 0604) BP: (108-145)/(44-68) 128/68  (01/12 0604) SpO2:  [91 %-100 %] 95 % (01/12 0604) Weight:  [58 kg] 58 kg (01/11 2253)   General appearance: alert, cooperative and no distress Resp: clear to auscultation bilaterally Cardio: regular rate and rhythm and systolic murmur: early systolic 2/6, crescendo at 2nd left intercostal space GI: normal findings: bowel sounds normal and soft, non-tender  Lab Results Recent Labs    01/11/20 0622 01/12/20 0441  WBC 6.5 5.7  HGB 12.3 11.5*  HCT 38.7 36.8  NA 142 142  K 3.3* 3.4*  CL 104 106  CO2 27 28  BUN 14 16  CREATININE 0.49 0.56   Liver Panel Recent Labs    01/10/20 1723 01/11/20 0622  PROT 6.7 5.8*  ALBUMIN 3.8 3.2*  AST 33 27  ALT 19 19  ALKPHOS 40 33*  BILITOT 1.1 0.9   Sedimentation Rate No results for input(s): ESRSEDRATE in the last 72 hours. C-Reactive Protein No results for input(s): CRP in the last 72 hours.  Microbiology: Recent Results (from the past 240 hour(s))  Blood Culture (routine x 2)     Status: Abnormal (Preliminary result)   Collection Time: 01/10/20  5:23 PM   Specimen: BLOOD LEFT ARM  Result Value Ref Range  Status   Specimen Description   Final    BLOOD LEFT ARM Performed at [facility], [place] [address] [place], [place] [address]    Special Requests   Final    BOTTLES DRAWN AEROBIC AND ANAEROBIC Blood Culture adequate volume Performed at [facility] [address], [place], [place] [address]    Culture  Setup Time   Final    GRAM POSITIVE COCCI ANAEROBIC BOTTLE ONLY CRITICAL RESULT CALLED TO, READ BACK BY AND VERIFIED WITH: A JACKSON [id number] AT 1024 AM BY CM Performed at [facility], [place] [address] [place], [place] [address]    Culture STAPHYLOCOCCUS AUREUS (A)  Final   Report Status PENDING  Incomplete  Respiratory Panel by RT PCR (Flu A&B, Covid) - Nasopharyngeal Swab     Status: None   Collection Time: 01/10/20  5:24 PM   Specimen: Nasopharyngeal Swab  Result Value Ref Range Status    SARS Coronavirus 2 by RT PCR NEGATIVE NEGATIVE Final    Comment: (NOTE) SARS-CoV-2 target nucleic acids are NOT DETECTED. The SARS-CoV-2 RNA is generally detectable in upper respiratoy specimens during the acute phase of infection. The lowest concentration of SARS-CoV-2 viral copies this assay can detect is 131 copies/mL. A negative result does not preclude SARS-Cov-2 infection and should not be used as the sole basis for treatment or other patient management decisions. A negative result may occur with  improper specimen collection/handling, submission of specimen other than nasopharyngeal swab, presence of viral mutation(s) within the areas targeted by this assay, and inadequate number of viral copies (<131 copies/mL). A negative result must be combined with clinical observations, patient history, and epidemiological information. The expected result is Negative. Fact Sheet for Patients:  PinkCheek.be Fact Sheet for Healthcare Providers:  GravelBags.it This test is not yet ap proved or cleared by the [place] FDA and  has been authorized for detection and/or diagnosis of SARS-CoV-2 by FDA under an Emergency Use Authorization (EUA). This EUA will remain  in effect (meaning this test can be used) for the duration of the COVID-19 declaration under Section 564(b)(1) of the Act, 21 U.S.C. section 360bbb-3(b)(1), unless the authorization is terminated or revoked sooner.    Influenza A by PCR NEGATIVE NEGATIVE Final   Influenza B by PCR NEGATIVE NEGATIVE Final    Comment: (NOTE) The Xpert Xpress SARS-CoV-2/FLU/RSV assay is intended as an aid in  the diagnosis of influenza from Nasopharyngeal swab specimens and  should not be used as a sole basis for treatment. Nasal washings and  aspirates are unacceptable for Xpert Xpress SARS-CoV-2/FLU/RSV  testing. Fact Sheet for Patients: PinkCheek.be Fact  Sheet for Healthcare Providers: GravelBags.it This test is not yet approved or cleared by the [place] FDA and  has been authorized for detection and/or diagnosis of SARS-CoV-2 by  FDA under an Emergency Use Authorization (EUA). This EUA will remain  in effect (meaning this test can be used) for the duration of the  Covid-19 declaration under Section 564(b)(1) of the Act, 21  U.S.C. section 360bbb-3(b)(1), unless the authorization is  terminated or revoked. Performed at [facility], [place] [address] [place], [place] [address]   Blood Culture (routine x 2)     Status: Abnormal (Preliminary result)   Collection Time: 01/10/20  5:28 PM   Specimen: BLOOD LEFT HAND  Result Value Ref Range Status   Specimen Description   Final    BLOOD LEFT HAND Performed at [facility], [place] [address], [place], [place] [address]    Special Requests   Final    BOTTLES DRAWN AEROBIC AND ANAEROBIC Blood Culture adequate volume Performed at [facility] [address], [place], [place] [address]    Culture  Setup Time   Final    GRAM POSITIVE COCCI IN BOTH AEROBIC AND ANAEROBIC BOTTLES Organism ID to follow CRITICAL VALUE NOTED.  VALUE IS CONSISTENT WITH PREVIOUSLY REPORTED AND CALLED VALUE. Performed at [facility], [place] [address], [place], [place] [address]    Culture STAPHYLOCOCCUS AUREUS (A)  Final   Report Status PENDING  Incomplete  Blood Culture ID Panel (Reflexed)     Status: Abnormal   Collection Time: 01/10/20  5:28 PM  Result Value Ref Range Status   Enterococcus species NOT DETECTED NOT DETECTED Final   Listeria monocytogenes NOT DETECTED NOT DETECTED Final   Staphylococcus species DETECTED (A) NOT DETECTED Final    Comment: CRITICAL RESULT CALLED TO, READ BACK BY AND VERIFIED WITH: Seleta Rhymes PharmD 12:30 01/11/20 ([email or web address])    Staphylococcus aureus (BCID) DETECTED (A) NOT DETECTED Final     Comment: Methicillin (oxacillin) susceptible Staphylococcus aureus (MSSA). Preferred therapy is anti staphylococcal beta lactam antibiotic (Cefazolin or Nafcillin), unless clinically contraindicated. CRITICAL RESULT CALLED TO, READ BACK BY AND VERIFIED WITH: Seleta Rhymes PharmD 12:30 01/11/20 ([email or web address])    Methicillin resistance NOT DETECTED NOT DETECTED Final   Streptococcus species NOT DETECTED NOT DETECTED Final   Streptococcus agalactiae NOT DETECTED NOT DETECTED Final   Streptococcus pneumoniae NOT DETECTED NOT DETECTED Final   Streptococcus pyogenes NOT DETECTED NOT DETECTED Final   Acinetobacter baumannii NOT DETECTED NOT DETECTED Final   Enterobacteriaceae species NOT DETECTED NOT DETECTED Final   Enterobacter cloacae complex NOT DETECTED NOT DETECTED Final   Escherichia coli NOT DETECTED NOT DETECTED Final   Klebsiella oxytoca NOT DETECTED NOT DETECTED Final   Klebsiella pneumoniae NOT DETECTED NOT DETECTED Final   Proteus species NOT DETECTED NOT DETECTED Final   Serratia marcescens NOT DETECTED NOT DETECTED Final   Haemophilus influenzae NOT DETECTED NOT DETECTED Final   Neisseria meningitidis NOT DETECTED NOT DETECTED Final   Pseudomonas aeruginosa NOT DETECTED NOT DETECTED Final   Candida albicans NOT DETECTED NOT DETECTED Final   Candida glabrata NOT DETECTED NOT DETECTED Final   Candida krusei NOT DETECTED NOT DETECTED Final   Candida parapsilosis NOT DETECTED NOT DETECTED Final   Candida tropicalis NOT DETECTED NOT DETECTED Final    Comment: Performed at [facility], [place]. [address] [place], [place] [address]  SARS CORONAVIRUS 2 (TAT 6-24 HRS) Nasopharyngeal Nasopharyngeal Swab     Status: None   Collection Time: 01/10/20  9:41 PM   Specimen: Nasopharyngeal Swab  Result Value Ref Range Status   SARS Coronavirus 2 NEGATIVE NEGATIVE Final    Comment: (NOTE) SARS-CoV-2 target nucleic acids are NOT DETECTED. The SARS-CoV-2 RNA is generally detectable in upper and  lower respiratory specimens during the acute phase of infection. Negative results do not preclude SARS-CoV-2 infection, do not rule out co-infections with other pathogens, and should not be used as the sole basis for treatment or other patient management decisions. Negative results must be combined with clinical observations, patient history, and epidemiological information. The expected result is Negative. Fact Sheet for Patients: SugarRoll.be Fact Sheet for Healthcare Providers: https://www.woods-mathews.com/ This test is not yet approved or cleared by the [place] FDA and  has been authorized for detection and/or diagnosis of SARS-CoV-2  by FDA under an Emergency Use Authorization (EUA). This EUA will remain  in effect (meaning this test can be used) for the duration of the COVID-19 declaration under Section 56 4(b)(1) of the Act, 21 U.S.C. section 360bbb-3(b)(1), unless the authorization is terminated or revoked sooner. Performed at [facility], [place] [address] [place], [place] [address]     Studies/Results: CT HEAD WO CONTRAST  Result Date: 01/11/2020 CLINICAL DATA:  Ataxia. EXAM: CT HEAD WITHOUT CONTRAST TECHNIQUE: Contiguous axial images were obtained from the base of the skull through the vertex without intravenous contrast. COMPARISON:  March 02, 2019. FINDINGS: Brain: Mild diffuse cortical atrophy is noted. Mild chronic ischemic white matter disease is noted. No mass effect or midline shift is noted. Ventricular size is within normal limits. There is no evidence of mass lesion, hemorrhage or acute infarction. Vascular: No hyperdense vessel or unexpected calcification. Skull: Normal. Negative for fracture or focal lesion. Sinuses/Orbits: No acute finding. Other: None. IMPRESSION: Mild diffuse cortical atrophy. Mild chronic ischemic white matter disease. No acute intracranial abnormality seen. Electronically Signed   By: Marijo Conception M.D.   On: 01/11/2020 07:37   DG Chest Port 1 View  Result Date: 01/10/2020 CLINICAL DATA:  Hypoxia. EXAM: PORTABLE CHEST 1 VIEW COMPARISON:  January 10, 2020 FINDINGS: Minimal atelectasis in the left base. The heart, hila, mediastinum, lungs, and pleura are otherwise unremarkable. IMPRESSION: Minimal atelectasis in the left base.  No other acute abnormalities. Electronically Signed   By: Dorise Bullion III M.D   On: 01/10/2020 18:03   CT RENAL STONE STUDY  Result Date: 01/11/2020 CLINICAL DATA:  Fever and confusion EXAM: CT ABDOMEN AND PELVIS WITHOUT CONTRAST TECHNIQUE: Multidetector CT imaging of the abdomen and pelvis was performed following the standard protocol without IV contrast. COMPARISON:  05/10/2015 FINDINGS: Lower chest: Coronary atherosclerosis. Mitral annular calcification. Atelectasis in the lower lungs with bronchiectasis in airways at the right anterior base. Hepatobiliary: No focal liver abnormality.No evidence of biliary obstruction or stone. Pancreas: Unremarkable. Spleen: Generalized atrophy. Adrenals/Urinary Tract: Negative adrenals. No hydronephrosis or ureteral stone. Small cystic density in the left kidney. Mild excavation/scar-like appearance of the right kidney. Small calcification at the left renal hilum appears vascular on reformats. Unremarkable bladder. Stomach/Bowel:  No obstruction. No appendicitis. Vascular/Lymphatic: No acute vascular abnormality. No mass or adenopathy. Reproductive:Hysterectomy Other: No ascites or pneumoperitoneum. Musculoskeletal: L1 compression fracture with moderate height loss. Anteriorly a fracture plane is still seen but this has an overall chronic appearance. Remote T12 and L3 superior endplate fractures. Laminectomy at the thoracolumbar junction with spinal catheter fragment. L5-S1 anterolisthesis. IMPRESSION: 1. No acute finding.  No evident urinary tract infection. 2. Nonacute appearing compression fractures at T12, L1, and L3 that are  new since 2016. 3. Multiple incidental findings noted above. Electronically Signed   By: Monte Fantasia M.D.   On: 01/11/2020 07:51     Assessment/Plan: MSSA bacteremia Sepsis BRBPR Dementia CAD Hypokalemia Nonacute compression fractures T12, L1, and L3  Total days of antibiotics: 2 ancef       Repeat BCx 1-12 are pending.  TTE pending.  H/H stable.  K+ still slightly low Will continue to watch    Bobby Rumpf MD, FACP Infectious Diseases (pager) [phone] [email or web address] 01/12/2020, 9:18 AM  LOS: 2 days

## 2020-01-12 NOTE — Progress Notes (Signed)
Initial Nutrition Assessment  RD working remotely.   DOCUMENTATION CODES:   Not applicable  INTERVENTION:  - will order Ensure Enlive BID, each supplement provides 350 kcal and 20 grams of protein. - will order Magic Cup with dinner meals, each supplement provides 290 kcal and 9 grams of protein. - will order daily multivitamin with minerals.  - continue to encourage PO intakes.    NUTRITION DIAGNOSIS:   Increased nutrient needs related to acute illness(sepsis) as evidenced by estimated needs.  GOAL:   Patient will meet greater than or equal to 90% of their needs  MONITOR:   PO intake, Supplement acceptance, Labs, Weight trends  REASON FOR ASSESSMENT:   Malnutrition Screening Tool  ASSESSMENT:   86 year old female with medical history of prior TIA, non-obstructive CAD, and dementia. She presented to the ED from ALF due to fever (101 degrees) and confusion. She received the first dose of the COVID-19 vaccine on 1/8. She was treated for possible colitis 7 days PTA. She was admitted for sepsis, MSSA.  Unable to see patient x2 attempts this afternoon d/t need for nursing assistance and planned ECHO. Per flow sheet documentation, patient consumed 50% of breakfast this AM (366 kcal, 5.5 grams protein). Patient noted to be a/o x4 despite H&P documentation indicating she has hx of dementia. Per chart review, current weight is 127 lb and weight on 10/19/19 was 125 lb. Weight on 02/10/19 was 131 lb. This indicates 4 lb weight loss in the past 11 months; not significant for time frame.  Per notes: - MSSA bacteremia - acute metabolic encephalopathy--resolved   Labs reviewed; K: 3.4 mmol/l, Ca: 8.8 mg/dl. Medications reviewed; 20 mg oral pepcid BID, 40 mEq Klor-Con x1 dose 1/12.    NUTRITION - FOCUSED PHYSICAL EXAM:  unable to complete at this time.   Diet Order:   Diet Order            Diet Heart Room service appropriate? Yes; Fluid consistency: Thin  Diet effective now               EDUCATION NEEDS:   No education needs have been identified at this time  Skin:  Skin Assessment: Reviewed RN Assessment  Last BM:  1/11  Height:   Ht Readings from Last 1 Encounters:  01/11/20 5' (1.524 m)    Weight:   Wt Readings from Last 1 Encounters:  01/11/20 58 kg    Ideal Body Weight:  45.4 kg  BMI:  Body mass index is 24.97 kg/m.  Estimated Nutritional Needs:   Kcal:  1625-1860 kcal  Protein:  70-85 grams  Fluid:  >/= 1.8 L/day     Jarome Matin, MS, RD, LDN, [facility] Inpatient Clinical Dietitian Pager # [phone] After hours/weekend pager # [phone]

## 2020-01-12 NOTE — Progress Notes (Signed)
  Echocardiogram 2D Echocardiogram has been performed.  [name] 01/12/2020, 2:21 PM

## 2020-01-12 NOTE — Progress Notes (Signed)
PROGRESS NOTE  [name] [id number] DOB: 10/06/1926 DOA: 01/10/2020 PCP: Robyne Peers, MD   LOS: 2 days   Brief Narrative / Interim history: 86 year old female with prior TIA, nonobstructive CAD, dementia who was brought in from her assisted living facility due to fever and confusion.  Patient received COVID-19 vaccine dose #1 on 1/8.  Daughter reports her episode of rectal bleeding about 7 days ago and she was treated with ciprofloxacin and metronidazole for possible colitis.  The day prior to admission patient became more confused and had a fever of 101, and she was brought to the hospital.  She was admitted with sepsis without a clear source but eventually blood cultures came back positive for MSSA.  ID consulted  Subjective / 24h Interval events: She is doing well this morning, denies any chest pain, denies any new rashes.  No abdominal pain, no nausea or vomiting.  Assessment & Plan: Principal Problem MSSA bacteremia, sepsis ruled out -No clear source, no skin lesions/boils, no rashes -ID automatically consulted -Placed on Ancef, continue -2D echo pending  Active Problems Acute metabolic encephalopathy -Likely from bacteremia, resolved and she appears back to baseline  History of TIA -Continue home Plavix  History of dementia -Continue Aricept, Namenda  History of hypertension -blood pressure normal   Scheduled Meds: . clopidogrel  75 mg Oral Daily  . donepezil  10 mg Oral QHS  . enoxaparin (LOVENOX) injection  40 mg Subcutaneous Q24H  . famotidine  20 mg Oral BID  . memantine  10 mg Oral Daily   Continuous Infusions: .  ceFAZolin (ANCEF) IV 2 g (01/12/20 0506)   PRN Meds:.acetaminophen **OR** acetaminophen, LORazepam, ondansetron **OR** ondansetron (ZOFRAN) IV  DVT prophylaxis: Lovenox Code Status: Full code Family Communication: daughter over the phone Disposition Plan: TBD  Consultants:  ID  Procedures:  2D echo:  pending  Microbiology  Blood cultures 01/10/2020-staph aureus Blood cultures 01/12/2020-pending SARS-CoV-2 1/10-negative  Antimicrobials: Ancef 1/11 >>   Objective: Vitals:   01/11/20 2130 01/11/20 2200 01/11/20 2253 01/12/20 0604  BP: (!) 126/58 (!) 124/53 (!) 133/58 128/68  Pulse: 70 75 67 71  Resp: (!) 22 (!) 25 16 16   Temp:   98 F (36.7 C) 98.6 F (37 C)  TempSrc:   Oral Oral  SpO2: 97% 97% 93% 95%  Weight:   58 kg   Height:   5' (1.524 m)     Intake/Output Summary (Last 24 hours) at 01/12/2020 1214 Last data filed at 01/12/2020 1047 Gross per 24 hour  Intake 387.04 ml  Output --  Net 387.04 ml   Filed Weights   01/10/20 1732 01/11/20 2253  Weight: 59 kg 58 kg    Examination:  Constitutional: NAD Eyes: no scleral icterus ENMT: Mucous membranes are moist.  Neck: normal, supple Respiratory: clear to auscultation bilaterally, no wheezing, no crackles. Normal respiratory effort. No accessory muscle use.  Cardiovascular: Regular rate and rhythm, 3/6 SEM. No LE edema. Good peripheral pulses Abdomen: non distended, no tenderness. Bowel sounds positive.  Musculoskeletal: no clubbing / cyanosis.  Skin: no rashes Neurologic: CN 2-12 grossly intact. Strength 5/5 in all 4.  Psychiatric: Normal judgment and insight. Alert and oriented x 3. Normal mood.   Data Reviewed: I have independently reviewed following labs and imaging studies   CBC: Recent Labs  Lab 01/10/20 1723 01/11/20 0100 01/11/20 0622 01/12/20 0441  WBC 5.8 4.7 6.5 5.7  NEUTROABS 4.4  --  4.8  --   HGB 13.9 10.9* 12.3  11.5*  HCT 43.4 34.4* 38.7 36.8  MCV 100.5* 103.0* 102.9* 101.9*  PLT 216 159 176 [id number]   Basic Metabolic Panel: Recent Labs  Lab 01/10/20 1723 01/11/20 0100 01/11/20 0622 01/12/20 0441  NA 138  --  142 142  K 3.2*  --  3.3* 3.4*  CL 104  --  104 106  CO2 24  --  27 28  GLUCOSE 179*  --  113* 108*  BUN 12  --  14 16  CREATININE 0.62 0.62 0.49 0.56  CALCIUM 9.1  --  8.2*  8.8*   Liver Function Tests: Recent Labs  Lab 01/10/20 1723 01/11/20 0622  AST 33 27  ALT 19 19  ALKPHOS 40 33*  BILITOT 1.1 0.9  PROT 6.7 5.8*  ALBUMIN 3.8 3.2*   Coagulation Profile: Recent Labs  Lab 01/10/20 1723  INR 1.0   HbA1C: Recent Labs    01/11/20 0627  HGBA1C 5.6   CBG: No results for input(s): GLUCAP in the last 168 hours.  Recent Results (from the past 240 hour(s))  Blood Culture (routine x 2)     Status: Abnormal (Preliminary result)   Collection Time: 01/10/20  5:23 PM   Specimen: BLOOD LEFT ARM  Result Value Ref Range Status   Specimen Description   Final    BLOOD LEFT ARM Performed at [facility], [address] [place], [place] [address]    Special Requests   Final    BOTTLES DRAWN AEROBIC AND ANAEROBIC Blood Culture adequate volume Performed at [facility] [address], [place], [place] [address]    Culture  Setup Time   Final    GRAM POSITIVE COCCI ANAEROBIC BOTTLE ONLY CRITICAL RESULT CALLED TO, READ BACK BY AND VERIFIED WITH: A JACKSON [id number] AT 1024 AM BY CM Performed at [facility], [place] [address] [place], [place] [address]    Culture STAPHYLOCOCCUS AUREUS (A)  Final   Report Status PENDING  Incomplete  Respiratory Panel by RT PCR (Flu A&B, Covid) - Nasopharyngeal Swab     Status: None   Collection Time: 01/10/20  5:24 PM   Specimen: Nasopharyngeal Swab  Result Value Ref Range Status   SARS Coronavirus 2 by RT PCR NEGATIVE NEGATIVE Final    Comment: (NOTE) SARS-CoV-2 target nucleic acids are NOT DETECTED. The SARS-CoV-2 RNA is generally detectable in upper respiratoy specimens during the acute phase of infection. The lowest concentration of SARS-CoV-2 viral copies this assay can detect is 131 copies/mL. A negative result does not preclude SARS-Cov-2 infection and should not be used as the sole basis for treatment or other patient management decisions. A negative result may occur with   improper specimen collection/handling, submission of specimen other than nasopharyngeal swab, presence of viral mutation(s) within the areas targeted by this assay, and inadequate number of viral copies (<131 copies/mL). A negative result must be combined with clinical observations, patient history, and epidemiological information. The expected result is Negative. Fact Sheet for Patients:  PinkCheek.be Fact Sheet for Healthcare Providers:  GravelBags.it This test is not yet ap proved or cleared by the [place] FDA and  has been authorized for detection and/or diagnosis of SARS-CoV-2 by FDA under an Emergency Use Authorization (EUA). This EUA will remain  in effect (meaning this test can be used) for the duration of the COVID-19 declaration under Section 564(b)(1) of the Act, 21 U.S.C. section 360bbb-3(b)(1), unless the authorization is terminated or revoked sooner.    Influenza  A by PCR NEGATIVE NEGATIVE Final   Influenza B by PCR NEGATIVE NEGATIVE Final    Comment: (NOTE) The Xpert Xpress SARS-CoV-2/FLU/RSV assay is intended as an aid in  the diagnosis of influenza from Nasopharyngeal swab specimens and  should not be used as a sole basis for treatment. Nasal washings and  aspirates are unacceptable for Xpert Xpress SARS-CoV-2/FLU/RSV  testing. Fact Sheet for Patients: PinkCheek.be Fact Sheet for Healthcare Providers: GravelBags.it This test is not yet approved or cleared by the [place] FDA and  has been authorized for detection and/or diagnosis of SARS-CoV-2 by  FDA under an Emergency Use Authorization (EUA). This EUA will remain  in effect (meaning this test can be used) for the duration of the  Covid-19 declaration under Section 564(b)(1) of the Act, 21  U.S.C. section 360bbb-3(b)(1), unless the authorization is  terminated or revoked. Performed at  [facility], [place] [address] [place], [place] [address]   Blood Culture (routine x 2)     Status: Abnormal (Preliminary result)   Collection Time: 01/10/20  5:28 PM   Specimen: BLOOD LEFT HAND  Result Value Ref Range Status   Specimen Description   Final    BLOOD LEFT HAND Performed at [facility] [address], [place], [place] [address]    Special Requests   Final    BOTTLES DRAWN AEROBIC AND ANAEROBIC Blood Culture adequate volume Performed at [place] [address], [place], [place] [address]    Culture  Setup Time   Final    GRAM POSITIVE COCCI IN BOTH AEROBIC AND ANAEROBIC BOTTLES Organism ID to follow CRITICAL VALUE NOTED.  VALUE IS CONSISTENT WITH PREVIOUSLY REPORTED AND CALLED VALUE. Performed at [facility], [place] [address], [place], [place] [address]    Culture STAPHYLOCOCCUS AUREUS (A)  Final   Report Status PENDING  Incomplete  Blood Culture ID Panel (Reflexed)     Status: Abnormal   Collection Time: 01/10/20  5:28 PM  Result Value Ref Range Status   Enterococcus species NOT DETECTED NOT DETECTED Final   Listeria monocytogenes NOT DETECTED NOT DETECTED Final   Staphylococcus species DETECTED (A) NOT DETECTED Final    Comment: CRITICAL RESULT CALLED TO, READ BACK BY AND VERIFIED WITH: Seleta Rhymes PharmD 12:30 01/11/20 ([email or web address])    Staphylococcus aureus (BCID) DETECTED (A) NOT DETECTED Final    Comment: Methicillin (oxacillin) susceptible Staphylococcus aureus (MSSA). Preferred therapy is anti staphylococcal beta lactam antibiotic (Cefazolin or Nafcillin), unless clinically contraindicated. CRITICAL RESULT CALLED TO, READ BACK BY AND VERIFIED WITH: Seleta Rhymes PharmD 12:30 01/11/20 ([email or web address])    Methicillin resistance NOT DETECTED NOT DETECTED Final   Streptococcus species NOT DETECTED NOT DETECTED Final   Streptococcus agalactiae NOT DETECTED NOT DETECTED Final   Streptococcus pneumoniae NOT  DETECTED NOT DETECTED Final   Streptococcus pyogenes NOT DETECTED NOT DETECTED Final   Acinetobacter baumannii NOT DETECTED NOT DETECTED Final   Enterobacteriaceae species NOT DETECTED NOT DETECTED Final   Enterobacter cloacae complex NOT DETECTED NOT DETECTED Final   Escherichia coli NOT DETECTED NOT DETECTED Final   Klebsiella oxytoca NOT DETECTED NOT DETECTED Final   Klebsiella pneumoniae NOT DETECTED NOT DETECTED Final   Proteus species NOT DETECTED NOT DETECTED Final   Serratia marcescens NOT DETECTED NOT DETECTED Final   Haemophilus influenzae NOT DETECTED NOT DETECTED Final   Neisseria meningitidis NOT DETECTED NOT DETECTED Final   Pseudomonas aeruginosa NOT DETECTED NOT DETECTED Final   Candida albicans  NOT DETECTED NOT DETECTED Final   Candida glabrata NOT DETECTED NOT DETECTED Final   Candida krusei NOT DETECTED NOT DETECTED Final   Candida parapsilosis NOT DETECTED NOT DETECTED Final   Candida tropicalis NOT DETECTED NOT DETECTED Final    Comment: Performed at [facility], [place] [address] [place], [place] [address]  SARS CORONAVIRUS 2 (TAT 6-24 HRS) Nasopharyngeal Nasopharyngeal Swab     Status: None   Collection Time: 01/10/20  9:41 PM   Specimen: Nasopharyngeal Swab  Result Value Ref Range Status   SARS Coronavirus 2 NEGATIVE NEGATIVE Final    Comment: (NOTE) SARS-CoV-2 target nucleic acids are NOT DETECTED. The SARS-CoV-2 RNA is generally detectable in upper and lower respiratory specimens during the acute phase of infection. Negative results do not preclude SARS-CoV-2 infection, do not rule out co-infections with other pathogens, and should not be used as the sole basis for treatment or other patient management decisions. Negative results must be combined with clinical observations, patient history, and epidemiological information. The expected result is Negative. Fact Sheet for Patients: SugarRoll.be Fact Sheet for Healthcare  Providers: https://www.woods-mathews.com/ This test is not yet approved or cleared by the [place] FDA and  has been authorized for detection and/or diagnosis of SARS-CoV-2 by FDA under an Emergency Use Authorization (EUA). This EUA will remain  in effect (meaning this test can be used) for the duration of the COVID-19 declaration under Section 56 4(b)(1) of the Act, 21 U.S.C. section 360bbb-3(b)(1), unless the authorization is terminated or revoked sooner. Performed at [facility], [place] [address] [place], [place] [address]      Radiology Studies: No results found.   Marzetta Board, MD, PhD [facility]  Between 7 am - 7 pm I am available, please contact me via Amion or Securechat  Between 7 pm - 7 am I am not available, please contact night coverage MD/APP via Amion

## 2020-01-13 DIAGNOSIS — I10 Essential (primary) hypertension: Secondary | ICD-10-CM

## 2020-01-13 LAB — COMPREHENSIVE METABOLIC PANEL
ALT: 13 U/L (ref 0–44)
AST: 21 U/L (ref 15–41)
Albumin: 2.9 g/dL — ABNORMAL LOW (ref 3.5–5.0)
Alkaline Phosphatase: 36 U/L — ABNORMAL LOW (ref 38–126)
Anion gap: 9 (ref 5–15)
BUN: 11 mg/dL (ref 8–23)
CO2: 27 mmol/L (ref 22–32)
Calcium: 8.5 mg/dL — ABNORMAL LOW (ref 8.9–10.3)
Chloride: 105 mmol/L (ref 98–111)
Creatinine, Ser: 0.52 mg/dL (ref 0.44–1.00)
GFR calc Af Amer: >60 mL/min (ref >60)
GFR calc non Af Amer: >60 mL/min (ref >60)
Glucose, Bld: 116 mg/dL — ABNORMAL HIGH (ref 70–99)
Potassium: 3.6 mmol/L (ref 3.5–5.1)
Sodium: 141 mmol/L (ref 135–145)
Total Bilirubin: 0.7 mg/dL (ref 0.3–1.2)
Total Protein: 5.7 g/dL — ABNORMAL LOW (ref 6.5–8.1)

## 2020-01-13 LAB — CBC
HCT: 36.8 % (ref 36.0–46.0)
Hemoglobin: 12.1 g/dL (ref 12.0–15.0)
MCH: 33.1 pg (ref 26.0–34.0)
MCHC: 32.9 g/dL (ref 30.0–36.0)
MCV: 100.5 fL — ABNORMAL HIGH (ref 80.0–100.0)
Platelets: 201 10*3/uL (ref 150–400)
RBC: 3.66 MIL/uL — ABNORMAL LOW (ref 3.87–5.11)
RDW: 12.7 % (ref 11.5–15.5)
WBC: 4.7 10*3/uL (ref 4.0–10.5)
nRBC: 0 % (ref 0.0–0.2)

## 2020-01-13 LAB — URINE CULTURE: Culture: 10000 — AB

## 2020-01-13 LAB — CULTURE, BLOOD (ROUTINE X 2)
Special Requests: ADEQUATE
Special Requests: ADEQUATE

## 2020-01-13 MED ORDER — SODIUM CHLORIDE 0.9% FLUSH
10.0000 mL | Freq: Two times a day (BID) | INTRAVENOUS | Status: DC
  Administered 2020-01-14: 10 mL

## 2020-01-13 MED ORDER — SODIUM CHLORIDE 0.9% FLUSH: 10.0000 mL | INTRAVENOUS | Status: DC | PRN

## 2020-01-13 NOTE — TOC Progression Note (Signed)
Transition of Care [facility]) - Progression Note    Patient Details  Name: [name] MRN: [id number] Date of Birth: 08/30/1926  Transition of Care [facility] CM/SW Contact  Angelo Prindle, Juliann Pulse, RN Phone Number: 01/13/2020, 12:15 PM  Clinical Narrative:Checking on [facility] for iv abx. [facility] rep already following for initial teaching of iv abx, & med. Patient will stay w/dtr [place].       Expected Discharge Plan: [place] Barriers to Discharge: Continued Medical Work up  Expected Discharge Plan and Services Expected Discharge Plan: [place]   Discharge Planning Services: CM Consult   Living arrangements for the past 2 months: Assisted Living Facility                                       Social Determinants of Health (SDOH) Interventions    Readmission Risk Interventions No flowsheet data found.

## 2020-01-13 NOTE — Evaluation (Signed)
Occupational Therapy Evaluation Patient Details Name: [name] MRN: [id number] DOB: 05-Jan-1926 Today's Date: 01/13/2020    History of Present Illness Pt is 86 year old female with prior TIA, nonobstructive CAD, dementia who was brought in from her assisted living facility due to fever and confusion. Pt admitted with acute metabolic encephalopathy and MSSA bactermia.   Clinical Impression   Pt admitted with fever and confusion. Pt currently with functional limitations due to the deficits listed below (see OT Problem List).  Pt will benefit from skilled OT to increase their safety and independence with ADL and functional mobility for ADL to facilitate discharge to venue listed below.      Follow Up Recommendations  Other (comment)(ALF)    Equipment Recommendations  None recommended by OT    Recommendations for Other Services       Precautions / Restrictions Precautions Precautions: Fall      Mobility Bed Mobility Overal bed mobility: Needs Assistance Bed Mobility: Supine to Sit;Sit to Supine     Supine to sit: Min guard Sit to supine: Min assist   General bed mobility comments: Pt requiring increased time and use of bedrails; required assist for LE back into bed; educated on log roll as pt reporting chronic back pain  Transfers Overall transfer level: Needs assistance Equipment used: Rolling walker (2 wheeled) Transfers: Sit to/from Stand Sit to Stand: Min guard         General transfer comment: increased time ; bed elevated; cued for safe hand placement    Balance Overall balance assessment: Needs assistance Sitting-balance support: Feet supported;No upper extremity supported Sitting balance-Leahy Scale: Good     Standing balance support: Bilateral upper extremity supported;During functional activity Standing balance-Leahy Scale: Fair                             ADL either performed or assessed with clinical judgement   ADL Overall  ADL's : Needs assistance/impaired Eating/Feeding: Set up;Sitting   Grooming: Standing;Minimal assistance   Upper Body Bathing: Minimal assistance;Sitting   Lower Body Bathing: Maximal assistance;Sit to/from stand;Cueing for safety;Cueing for sequencing   Upper Body Dressing : Minimal assistance;Sitting   Lower Body Dressing: Maximal assistance;Sit to/from stand;Cueing for safety;Cueing for sequencing   Toilet Transfer: Minimal assistance;RW;Stand-pivot;Cueing for safety;Cueing for sequencing;BSC   Toileting- Clothing Manipulation and Hygiene: Minimal assistance;Sit to/from stand;Cueing for safety;Cueing for compensatory techniques         General ADL Comments: pt agreeable to OT and OOB     Vision Patient Visual Report: No change from baseline              Pertinent Vitals/Pain Pain Assessment: No/denies pain Faces Pain Scale: Hurts a little bit Pain Location: "Sore all over" Pain Intervention(s): Limited activity within patient's tolerance     Hand Dominance     Extremity/Trunk Assessment Upper Extremity Assessment Upper Extremity Assessment: Generalized weakness RUE Deficits / Details: Strength 4-/5; ROM WFL LUE Deficits / Details: Strength 4/5; ROM WFL   Lower Extremity Assessment Lower Extremity Assessment: Overall WFL for tasks assessed   Cervical / Trunk Assessment Cervical / Trunk Assessment: Normal   Communication Communication Communication: No difficulties   Cognition Arousal/Alertness: Awake/alert Behavior During Therapy: WFL for tasks assessed/performed Overall Cognitive Status: Within Functional Limits for tasks assessed  General Comments  VSS: O2 sats 96% activity and HR 85 bpm with walking            Home Living Family/patient expects to be discharged to:: Assisted living([facility])                             Home Equipment: Gilford Rile - 2 wheels;Wheelchair -  manual;Cane - single point;Shower seat - built in          Prior Functioning/Environment Level of Independence: Independent with assistive device(s);Needs assistance  Gait / Transfers Assistance Needed: Can walk to dining hall when using RW ADL's / Homemaking Assistance Needed: Independent with ADLs; sits on shower bench            OT Problem List: Decreased strength;Decreased activity tolerance;Impaired balance (sitting and/or standing)      OT Treatment/Interventions: Self-care/ADL training;Patient/family education;DME and/or AE instruction    OT Goals(Current goals can be found in the care plan section) Acute Rehab OT Goals Patient Stated Goal: get stronger; return to ALF OT Goal Formulation: With patient Time For Goal Achievement: 01/13/20  OT Frequency: Min 2X/week   Barriers to D/C:            Co-evaluation              AM-PAC OT "6 Clicks" Daily Activity     Outcome Measure Help from another person eating meals?: A Little Help from another person taking care of personal grooming?: A Little Help from another person toileting, which includes using toliet, bedpan, or urinal?: A Little Help from another person bathing (including washing, rinsing, drying)?: A Little Help from another person to put on and taking off regular upper body clothing?: A Little Help from another person to put on and taking off regular lower body clothing?: A Little 6 Click Score: 18   End of Session Equipment Utilized During Treatment: Rolling walker Nurse Communication: Mobility status  Activity Tolerance: Patient tolerated treatment well Patient left: in chair;with family/visitor present  OT Visit Diagnosis: Unsteadiness on feet (R26.81);Repeated falls (R29.6);Muscle weakness (generalized) (M62.81);History of falling (Z91.81)                Time: [id number] OT Time Calculation (min): 19 min Charges:  OT General Charges $OT Visit: 1 Visit OT Evaluation $OT Eval Moderate  Complexity: 1 Mod  Kari Baars, OT Acute Rehabilitation Services [phone] Office- [phone], Edwena Felty D 01/13/2020, 3:36 PM

## 2020-01-13 NOTE — Progress Notes (Signed)
INFECTIOUS DISEASE PROGRESS NOTE  ID: [name] is a 86 y.o. female with  Principal Problem:   Sepsis ([place]) Active Problems:   CAD (coronary artery disease)   Aortic aneurysm, thoracic ([place])   HTN (hypertension)   Alzheimer's disease ([place])   CAP (community acquired pneumonia)  Subjective: No complaints  Abtx:  Anti-infectives (From admission, onward)   Start     Dose/Rate Route Frequency Ordered Stop   01/11/20 1400  ceFAZolin (ANCEF) IVPB 2g/100 mL premix     2 g 200 mL/hr over 30 Minutes Intravenous Every 8 hours 01/11/20 1303     01/11/20 0400  cefTRIAXone (ROCEPHIN) 2 g in sodium chloride 0.9 % 100 mL IVPB  Status:  Discontinued     2 g 200 mL/hr over 30 Minutes Intravenous Daily 01/11/20 0100 01/11/20 1305   01/11/20 0400  azithromycin (ZITHROMAX) 500 mg in sodium chloride 0.9 % 250 mL IVPB  Status:  Discontinued     500 mg 250 mL/hr over 60 Minutes Intravenous Daily 01/11/20 0100 01/11/20 1310   01/10/20 1945  aztreonam (AZACTAM) 1 g in sodium chloride 0.9 % 100 mL IVPB  Status:  Discontinued     1 g 200 mL/hr over 30 Minutes Intravenous  Once 01/10/20 1935 01/10/20 1943   01/10/20 1945  vancomycin (VANCOREADY) IVPB 1250 mg/250 mL     1,250 mg 166.7 mL/hr over 90 Minutes Intravenous STAT 01/10/20 1941 01/10/20 2324   01/10/20 1945  ceFEPIme (MAXIPIME) 2 g in sodium chloride 0.9 % 100 mL IVPB     2 g 200 mL/hr over 30 Minutes Intravenous STAT 01/10/20 1943 01/10/20 2114      Medications:  Scheduled: . clopidogrel  75 mg Oral Daily  . donepezil  10 mg Oral QHS  . enoxaparin (LOVENOX) injection  40 mg Subcutaneous Q24H  . famotidine  20 mg Oral BID  . feeding supplement (ENSURE ENLIVE)  237 mL Oral BID BM  . memantine  10 mg Oral Daily  . multivitamin with minerals  1 tablet Oral Daily    Objective: Vital signs in last 24 hours: Temp:  [98.1 F (36.7 C)] 98.1 F (36.7 C) (01/13 0423) Pulse Rate:  [65-79] 65 (01/13 0423) Resp:  [16-20] 16 (01/13  0423) BP: (109-139)/(46-57) 123/57 (01/13 0423) SpO2:  [90 %-97 %] 93 % (01/13 0423)   General appearance: alert, cooperative and no distress Resp: clear to auscultation bilaterally Cardio: regular rate and rhythm GI: normal findings: bowel sounds normal and soft, non-tender  Lab Results Recent Labs    01/12/20 0441 01/13/20 0529  WBC 5.7 4.7  HGB 11.5* 12.1  HCT 36.8 36.8  NA 142 141  K 3.4* 3.6  CL 106 105  CO2 28 27  BUN 16 11  CREATININE 0.56 0.52   Liver Panel Recent Labs    01/11/20 0622 01/13/20 0529  PROT 5.8* 5.7*  ALBUMIN 3.2* 2.9*  AST 27 21  ALT 19 13  ALKPHOS 33* 36*  BILITOT 0.9 0.7   Sedimentation Rate No results for input(s): ESRSEDRATE in the last 72 hours. C-Reactive Protein No results for input(s): CRP in the last 72 hours.  Microbiology: Recent Results (from the past 240 hour(s))  Blood Culture (routine x 2)     Status: Abnormal   Collection Time: 01/10/20  5:23 PM   Specimen: BLOOD LEFT ARM  Result Value Ref Range Status   Specimen Description   Final    BLOOD LEFT ARM Performed at [facility], [place] [address], [place], [place] [address]    Special Requests   Final    BOTTLES DRAWN AEROBIC AND ANAEROBIC Blood Culture adequate volume Performed at [place] [address], [place], [place] [address]    Culture  Setup Time   Final    GRAM POSITIVE COCCI ANAEROBIC BOTTLE ONLY CRITICAL RESULT CALLED TO, READ BACK BY AND VERIFIED WITH: A JACKSON [id number] AT 1024 AM BY CM    Culture (A)  Final    STAPHYLOCOCCUS AUREUS SUSCEPTIBILITIES PERFORMED ON PREVIOUS CULTURE WITHIN THE LAST 5 DAYS. Performed at [facility], [place] [address], [place], [place] [address]    Report Status 01/13/2020 FINAL  Final  Urine culture     Status: None (Preliminary result)   Collection Time: 01/10/20  5:23 PM   Specimen: In/Out Cath Urine  Result Value Ref Range Status   Specimen Description   Final    IN/OUT CATH  URINE Performed at [place] [address], [place], [place] [address]    Special Requests   Final    NONE Performed at [facility], [place] [address], [place], [place] [address]    Culture   Final    CULTURE REINCUBATED FOR BETTER GROWTH Performed at [facility], [place] [address], [place], [place] [address]    Report Status PENDING  Incomplete  Respiratory Panel by RT PCR (Flu A&B, Covid) - Nasopharyngeal Swab     Status: None   Collection Time: 01/10/20  5:24 PM   Specimen: Nasopharyngeal Swab  Result Value Ref Range Status   SARS Coronavirus 2 by RT PCR NEGATIVE NEGATIVE Final    Comment: (NOTE) SARS-CoV-2 target nucleic acids are NOT DETECTED. The SARS-CoV-2 RNA is generally detectable in upper respiratoy specimens during the acute phase of infection. The lowest concentration of SARS-CoV-2 viral copies this assay can detect is 131 copies/mL. A negative result does not preclude SARS-Cov-2 infection and should not be used as the sole basis for treatment or other patient management decisions. A negative result may occur with  improper specimen collection/handling, submission of specimen other than nasopharyngeal swab, presence of viral mutation(s) within the areas targeted by this assay, and inadequate number of viral copies (<131 copies/mL). A negative result must be combined with clinical observations, patient history, and epidemiological information. The expected result is Negative. Fact Sheet for Patients:  PinkCheek.be Fact Sheet for Healthcare Providers:  GravelBags.it This test is not yet ap proved or cleared by the [place] FDA and  has been authorized for detection and/or diagnosis of SARS-CoV-2 by FDA under an Emergency Use Authorization (EUA). This EUA will remain  in effect (meaning this test can be used) for the duration of the COVID-19 declaration under  Section 564(b)(1) of the Act, 21 U.S.C. section 360bbb-3(b)(1), unless the authorization is terminated or revoked sooner.    Influenza A by PCR NEGATIVE NEGATIVE Final   Influenza B by PCR NEGATIVE NEGATIVE Final    Comment: (NOTE) The Xpert Xpress SARS-CoV-2/FLU/RSV assay is intended as an aid in  the diagnosis of influenza from Nasopharyngeal swab specimens and  should not be used as a sole basis for treatment. Nasal washings and  aspirates are unacceptable for Xpert Xpress SARS-CoV-2/FLU/RSV  testing. Fact Sheet for Patients: PinkCheek.be Fact Sheet for Healthcare Providers: GravelBags.it This test is not yet approved or cleared by the [place] FDA and  has been authorized for detection and/or diagnosis of SARS-CoV-2  by  FDA under an Emergency Use Authorization (EUA). This EUA will remain  in effect (meaning this test can be used) for the duration of the  Covid-19 declaration under Section 564(b)(1) of the Act, 21  U.S.C. section 360bbb-3(b)(1), unless the authorization is  terminated or revoked. Performed at [facility], [place] [address] [place], [place] [address]   Blood Culture (routine x 2)     Status: Abnormal   Collection Time: 01/10/20  5:28 PM   Specimen: BLOOD LEFT HAND  Result Value Ref Range Status   Specimen Description   Final    BLOOD LEFT HAND Performed at [facility] [address], [place], [place] [address]    Special Requests   Final    BOTTLES DRAWN AEROBIC AND ANAEROBIC Blood Culture adequate volume Performed at [place] [address] [place],  [address]    Culture  Setup Time   Final    GRAM POSITIVE COCCI IN BOTH AEROBIC AND ANAEROBIC BOTTLES CRITICAL VALUE NOTED.  VALUE IS CONSISTENT WITH PREVIOUSLY REPORTED AND CALLED VALUE. Performed at [facility], [place] [address] [place],  [address]    Culture  STAPHYLOCOCCUS AUREUS (A)  Final   Report Status 01/13/2020 FINAL  Final   Organism ID, Bacteria STAPHYLOCOCCUS AUREUS  Final      Susceptibility   Staphylococcus aureus - MIC*    CIPROFLOXACIN >=8 RESISTANT Resistant     ERYTHROMYCIN <=0.25 SENSITIVE Sensitive     GENTAMICIN <=0.5 SENSITIVE Sensitive     OXACILLIN <=0.25 SENSITIVE Sensitive     TETRACYCLINE <=1 SENSITIVE Sensitive     VANCOMYCIN 1 SENSITIVE Sensitive     TRIMETH/SULFA <=10 SENSITIVE Sensitive     CLINDAMYCIN <=0.25 SENSITIVE Sensitive     RIFAMPIN <=0.5 SENSITIVE Sensitive     Inducible Clindamycin NEGATIVE Sensitive     * STAPHYLOCOCCUS AUREUS  Blood Culture ID Panel (Reflexed)     Status: Abnormal   Collection Time: 01/10/20  5:28 PM  Result Value Ref Range Status   Enterococcus species NOT DETECTED NOT DETECTED Final   Listeria monocytogenes NOT DETECTED NOT DETECTED Final   Staphylococcus species DETECTED (A) NOT DETECTED Final    Comment: CRITICAL RESULT CALLED TO, READ BACK BY AND VERIFIED WITH: Seleta Rhymes PharmD 12:30 01/11/20 (wilsonm)    Staphylococcus aureus (BCID) DETECTED (A) NOT DETECTED Final    Comment: Methicillin (oxacillin) susceptible Staphylococcus aureus (MSSA). Preferred therapy is anti staphylococcal beta lactam antibiotic (Cefazolin or Nafcillin), unless clinically contraindicated. CRITICAL RESULT CALLED TO, READ BACK BY AND VERIFIED WITH: Seleta Rhymes PharmD 12:30 01/11/20 (wilsonm)    Methicillin resistance NOT DETECTED NOT DETECTED Final   Streptococcus species NOT DETECTED NOT DETECTED Final   Streptococcus agalactiae NOT DETECTED NOT DETECTED Final   Streptococcus pneumoniae NOT DETECTED NOT DETECTED Final   Streptococcus pyogenes NOT DETECTED NOT DETECTED Final   Acinetobacter baumannii NOT DETECTED NOT DETECTED Final   Enterobacteriaceae species NOT DETECTED NOT DETECTED Final   Enterobacter cloacae complex NOT DETECTED NOT DETECTED Final   Escherichia coli NOT DETECTED NOT DETECTED  Final   Klebsiella oxytoca NOT DETECTED NOT DETECTED Final   Klebsiella pneumoniae NOT DETECTED NOT DETECTED Final   Proteus species NOT DETECTED NOT DETECTED Final   Serratia marcescens NOT DETECTED NOT DETECTED Final   Haemophilus influenzae NOT DETECTED NOT DETECTED Final   Neisseria meningitidis NOT DETECTED NOT DETECTED Final   Pseudomonas aeruginosa NOT DETECTED NOT DETECTED Final  Candida albicans NOT DETECTED NOT DETECTED Final   Candida glabrata NOT DETECTED NOT DETECTED Final   Candida krusei NOT DETECTED NOT DETECTED Final   Candida parapsilosis NOT DETECTED NOT DETECTED Final   Candida tropicalis NOT DETECTED NOT DETECTED Final    Comment: Performed at [facility], [place] [address] [place], [place] [address]  SARS CORONAVIRUS 2 (TAT 6-24 HRS) Nasopharyngeal Nasopharyngeal Swab     Status: None   Collection Time: 01/10/20  9:41 PM   Specimen: Nasopharyngeal Swab  Result Value Ref Range Status   SARS Coronavirus 2 NEGATIVE NEGATIVE Final    Comment: (NOTE) SARS-CoV-2 target nucleic acids are NOT DETECTED. The SARS-CoV-2 RNA is generally detectable in upper and lower respiratory specimens during the acute phase of infection. Negative results do not preclude SARS-CoV-2 infection, do not rule out co-infections with other pathogens, and should not be used as the sole basis for treatment or other patient management decisions. Negative results must be combined with clinical observations, patient history, and epidemiological information. The expected result is Negative. Fact Sheet for Patients: SugarRoll.be Fact Sheet for Healthcare Providers: https://www.woods-mathews.com/ This test is not yet approved or cleared by the [place] FDA and  has been authorized for detection and/or diagnosis of SARS-CoV-2 by FDA under an Emergency Use Authorization (EUA). This EUA will remain  in effect (meaning this test can be used) for the  duration of the COVID-19 declaration under Section 56 4(b)(1) of the Act, 21 U.S.C. section 360bbb-3(b)(1), unless the authorization is terminated or revoked sooner. Performed at [facility], [place] [address], [place], [place] [address]     Studies/Results: ECHOCARDIOGRAM COMPLETE  Result Date: 01/12/2020   ECHOCARDIOGRAM REPORT   Patient Name:   SHAELEY YAKLIN Date of Exam: 01/12/2020 Medical Rec #:  [id number]           Height:       60.0 in Accession #:    [id number]          Weight:       127.9 lb Date of Birth:  November 15, 1926           BSA:          1.54 m Patient Age:    63 years            BP:           128/68 mmHg Patient Gender: F                   HR:           70 bpm. Exam Location:  Inpatient Procedure: 2D Echo, Cardiac Doppler and Color Doppler Indications:    Bacteremia 790.7  History:        Patient has prior history of Echocardiogram examinations, most                 recent 09/04/2017. CAD; Stroke. Thoracic Aortic Aneurysm.  Sonographer:    Jonelle Sidle Dance Referring Phys: [place]  1. Left ventricular ejection fraction, by visual estimation, is 60 to 65%. The left ventricle has normal function. There is borderline left ventricular hypertrophy.  2. Elevated left atrial pressure.  3. Left ventricular diastolic parameters are consistent with Grade I diastolic dysfunction (impaired relaxation).  4. The left ventricle has no regional wall motion abnormalities.  5. Global right ventricle has normal systolic function.The right ventricular size is normal. No increase in right ventricular wall thickness.  6. Left atrial  size was mildly dilated.  7. Right atrial size was normal.  8. Moderate thickening of the mitral valve leaflet(s).  9. Moderate to severe mitral annular calcification. 10. The mitral valve is degenerative. Mild to moderate mitral valve regurgitation. No evidence of mitral stenosis. 11. The tricuspid valve is normal in structure. 12. The aortic valve is  tricuspid. Aortic valve regurgitation is not visualized. Mild to moderate aortic valve sclerosis/calcification without any evidence of aortic stenosis. 13. The pulmonic valve was normal in structure. Pulmonic valve regurgitation is not visualized. 14. Mildly elevated pulmonary artery systolic pressure. 15. No vegetations are seen, consider TEE if the suspicion for endocarditis is high. FINDINGS  Left Ventricle: Left ventricular ejection fraction, by visual estimation, is 60 to 65%. The left ventricle has normal function. The left ventricle has no regional wall motion abnormalities. There is borderline left ventricular hypertrophy. Left ventricular diastolic parameters are consistent with Grade I diastolic dysfunction (impaired relaxation). Elevated left atrial pressure. Right Ventricle: The right ventricular size is normal. No increase in right ventricular wall thickness. Global RV systolic function is has normal systolic function. The tricuspid regurgitant velocity is 2.85 m/s, and with an assumed right atrial pressure  of 3 mmHg, the estimated right ventricular systolic pressure is mildly elevated at 35.5 mmHg. Left Atrium: Left atrial size was mildly dilated. Right Atrium: Right atrial size was normal in size Pericardium: There is no evidence of pericardial effusion. Mitral Valve: The mitral valve is degenerative in appearance. There is moderate thickening of the mitral valve leaflet(s). Moderate to severe mitral annular calcification. Mild to moderate mitral valve regurgitation, with centrally-directed jet. No evidence of mitral valve stenosis by observation. MV peak gradient, 12.8 mmHg. Tricuspid Valve: The tricuspid valve is normal in structure. Tricuspid valve regurgitation is mild. Aortic Valve: The aortic valve is tricuspid. Aortic valve regurgitation is not visualized. Mild to moderate aortic valve sclerosis/calcification is present, without any evidence of aortic stenosis. Pulmonic Valve: The pulmonic  valve was normal in structure. Pulmonic valve regurgitation is not visualized. Pulmonic regurgitation is not visualized. Aorta: The aortic root is normal in size and structure. IAS/Shunts: No atrial level shunt detected by color flow Doppler.  LEFT VENTRICLE PLAX 2D LVIDd:         3.60 cm  Diastology LVIDs:         2.50 cm  LV e' lateral:   6.85 cm/s LV PW:         1.23 cm  LV E/e' lateral: 19.0 LV IVS:        1.10 cm  LV e' medial:    5.11 cm/s LVOT diam:     2.10 cm  LV E/e' medial:  25.4 LV SV:         32 ml LV SV Index:   20.32 LVOT Area:     3.46 cm  RIGHT VENTRICLE             IVC RV Basal diam:  2.60 cm     IVC diam: 1.80 cm RV S prime:     14.90 cm/s TAPSE (M-mode): 1.9 cm LEFT ATRIUM             Index       RIGHT ATRIUM          Index LA diam:        4.00 cm 2.59 cm/m  RA Area:     7.89 cm LA Vol (A2C):   93.5 ml 60.58 ml/m RA Volume:   9.84 ml  6.38 ml/m LA Vol (A4C):   71.1 ml 46.07 ml/m LA Biplane Vol: 86.0 ml 55.72 ml/m  AORTIC VALVE LVOT Vmax:   139.00 cm/s LVOT Vmean:  95.700 cm/s LVOT VTI:    0.339 m  AORTA Ao Root diam: 3.20 cm Ao Asc diam:  3.60 cm MITRAL VALVE                         TRICUSPID VALVE MV Area (PHT): 2.34 cm              TR Peak grad:   32.5 mmHg MV Peak grad:  12.8 mmHg             TR Vmax:        285.00 cm/s MV Mean grad:  4.0 mmHg MV Vmax:       1.79 m/s              SHUNTS MV Vmean:      94.0 cm/s             Systemic VTI:  0.34 m MV VTI:        0.55 m                Systemic Diam: 2.10 cm MV PHT:        93.96 msec MV Decel Time: 324 msec MV E velocity: 130.00 cm/s 103 cm/s MV A velocity: 143.00 cm/s 70.3 cm/s MV E/A ratio:  0.91        1.5  Mihai Croitoru MD Electronically signed by Sanda Klein MD Signature Date/Time: 01/12/2020/5:28:10 PM    Final      Assessment/Plan: MSSA bacteremia Sepsis BRBPR Dementia CAD Hypokalemia Nonacute compression fractures T12, L1, and L3  Total days of antibiotics: 3 ancef  TTE does not show vegetation.  Repeat BCx from  1-12 are pending.  Provided her BCx remain negative, would favor giving her 2 weeks of ancef followed by 4 weeks of keflex.  Midline ordered.  Available as needed.          Bobby Rumpf MD, FACP Infectious Diseases (pager) [phone] [email or web address] 01/13/2020, 8:50 AM  LOS: 3 days

## 2020-01-13 NOTE — Care Management Important Message (Signed)
Important Message  Patient Details IM Letter given to Dessa Phi RN Case Manager to present to the Patient Name: [name] MRN: [id number] Date of Birth: Nov 11, 1926   Medicare Important Message Given:  Yes     [name] 01/13/2020, 12:46 PM

## 2020-01-13 NOTE — Evaluation (Signed)
Physical Therapy Evaluation Patient Details Name: [name] MRN: [id number] DOB: Aug 29, 1926 Today's Date: 01/13/2020   History of Present Illness  Pt is 86 year old female with prior TIA, nonobstructive CAD, dementia who was brought in from her assisted living facility due to fever and confusion. Pt admitted with acute metabolic encephalopathy and MSSA bactermia.  Clinical Impression   Pt admitted with above diagnosis. Pt requiring min guard to min A for transfers.  Able to ambulate 63' with RW and mild unsteadiness. Fatigued easily.  Pt is from ALF. Pt currently with functional limitations due to the deficits listed below (see PT Problem List). Pt will benefit from skilled PT to increase their independence and safety with mobility to allow discharge to the venue listed below.       Follow Up Recommendations Home health PT(Return to ALF).  Discussed with pt having increased assist from ALF staff as needed (she reports only calls if emergency, encouraged to call more if needed).    Equipment Recommendations  None recommended by PT(has DME)    Recommendations for Other Services       Precautions / Restrictions Precautions Precautions: Fall      Mobility  Bed Mobility Overal bed mobility: Needs Assistance Bed Mobility: Supine to Sit;Sit to Supine     Supine to sit: Min guard Sit to supine: Min assist   General bed mobility comments: Pt requiring increased time and use of bedrails; required assist for LE back into bed; educated on log roll as pt reporting chronic back pain  Transfers Overall transfer level: Needs assistance Equipment used: Rolling walker (2 wheeled) Transfers: Sit to/from Stand Sit to Stand: Min guard         General transfer comment: increased time ; bed elevated; cued for safe hand placement  Ambulation/Gait Ambulation/Gait assistance: Min guard Gait Distance (Feet): 80 Feet Assistive device: Rolling walker (2 wheeled) Gait  Pattern/deviations: Step-through pattern;Decreased stride length Gait velocity: decreased   General Gait Details: cued for RW safety/placement with turns; fatigued easily; mild unsteadiness  Stairs            Wheelchair Mobility    Modified Rankin (Stroke Patients Only)       Balance Overall balance assessment: Needs assistance Sitting-balance support: Feet supported;No upper extremity supported Sitting balance-Leahy Scale: Good     Standing balance support: Bilateral upper extremity supported;During functional activity Standing balance-Leahy Scale: Fair                               Pertinent Vitals/Pain Pain Assessment: Faces Faces Pain Scale: Hurts a little bit Pain Location: "Sore all over" Pain Intervention(s): Limited activity within patient's tolerance    Home Living Family/patient expects to be discharged to:: Assisted living([facility])               Home Equipment: Gilford Rile - 2 wheels;Wheelchair - manual;Cane - single point;Shower seat - built in      Prior Function Level of Independence: Independent with assistive device(s);Needs assistance   Gait / Transfers Assistance Needed: Can walk to dining hall when using RW  ADL's / Homemaking Assistance Needed: Independent with ADLs; sits on shower bench        Hand Dominance        Extremity/Trunk Assessment   Upper Extremity Assessment Upper Extremity Assessment: RUE deficits/detail;LUE deficits/detail RUE Deficits / Details: Strength 4-/5; ROM WFL LUE Deficits / Details: Strength 4/5; ROM WFL    Lower  Extremity Assessment Lower Extremity Assessment: Overall WFL for tasks assessed    Cervical / Trunk Assessment Cervical / Trunk Assessment: Normal  Communication   Communication: No difficulties  Cognition Arousal/Alertness: Awake/alert Behavior During Therapy: WFL for tasks assessed/performed Overall Cognitive Status: Within Functional Limits for tasks assessed                                         General Comments General comments (skin integrity, edema, etc.): VSS: O2 sats 96% activity and HR 85 bpm with walking    Exercises     Assessment/Plan    PT Assessment Patient needs continued PT services  PT Problem List Decreased strength;Decreased mobility;Decreased safety awareness;Decreased activity tolerance;Decreased balance;Decreased knowledge of use of DME       PT Treatment Interventions DME instruction;Therapeutic exercise;Gait training;Balance training;Stair training;Functional mobility training;Therapeutic activities;Patient/family education    PT Goals (Current goals can be found in the Care Plan section)  Acute Rehab PT Goals Patient Stated Goal: get stronger; return to ALF PT Goal Formulation: With patient Time For Goal Achievement: 01/27/20 Potential to Achieve Goals: Good    Frequency Min 3X/week   Barriers to discharge        Co-evaluation               AM-PAC PT "6 Clicks" Mobility  Outcome Measure Help needed turning from your back to your side while in a flat bed without using bedrails?: A Little Help needed moving from lying on your back to sitting on the side of a flat bed without using bedrails?: A Little Help needed moving to and from a bed to a chair (including a wheelchair)?: None Help needed standing up from a chair using your arms (e.g., wheelchair or bedside chair)?: None Help needed to walk in hospital room?: None Help needed climbing 3-5 steps with a railing? : A Little 6 Click Score: 21    End of Session Equipment Utilized During Treatment: Gait belt Activity Tolerance: Patient tolerated treatment well Patient left: in bed;with call bell/phone within reach;with bed alarm set Nurse Communication: Mobility status PT Visit Diagnosis: Unsteadiness on feet (R26.81);Muscle weakness (generalized) (M62.81)    Time: [id number] PT Time Calculation (min) (ACUTE ONLY): 26 min   Charges:    PT Evaluation $PT Eval Low Complexity: 1 Low          Maggie Font, PT Acute Rehab Services Pager [phone] [facility] [phone] [facility] [phone]   Karlton Lemon 01/13/2020, 1:57 PM

## 2020-01-13 NOTE — Progress Notes (Signed)
Late entry: Arrived to room to place midline for home use. Pt tearful, stated several times she didn't know what to do. Explained procedure and rationale, pt was receptive, would like to discuss with her daughter first.   Martin Majestic back to reassess, Ria Comment, RN stated she will let IV Team know once daughter has been informed of plan and discussed with patient.

## 2020-01-13 NOTE — Progress Notes (Signed)
PROGRESS NOTE    [name]  [id number] DOB: [birth year] DOA: 01/10/2020 PCP: Robyne Peers, MD    Brief Narrative:  86 year old female with prior TIA, nonobstructive CAD, dementia who was brought in from her assisted living facility due to fever and confusion. Patient received COVID-19 vaccine dose #1 on 1/8. Daughter reports her episode of rectal bleeding about 7 days ago and she was treated with ciprofloxacin and metronidazole for possible colitis. The day prior to admission patient became more confused and had a fever of 101, and she was brought to the hospital.  She was admitted with sepsis without a clear source but eventually blood cultures came back positive for MSSA.  ID consulted  Assessment & Plan:   Principal Problem:   Sepsis ([place]) Active Problems:   CAD (coronary artery disease)   Aortic aneurysm, thoracic (HCC)   HTN (hypertension)   Alzheimer's disease ([place])   CAP (community acquired pneumonia)  Principal Problem MSSA bacteremia, sepsis ruled out -No clear source, no skin lesions/boils, no rashes -ID consulted and following -TTE neg for vegetations -ID recommends 2 weeks of ancef followed by 4 more weeks of keflex -Placed on Ancef, continue -2D echo pending  Active Problems Acute metabolic encephalopathy -Likely from bacteremia, resolved and she appears back to baseline -Stable at this time -Daughter at bedside states mentation now much improved  History of TIA -Continue home Plavix as tolerated  History of dementia -Continue Aricept, Namenda as tolerated -Stable at this time  History of hypertension -blood pressure normal  DVT prophylaxis: Lovenox subQ Code Status: DNR Family Communication: Pt in room, family at bedside Disposition Plan: Possible home in 24hrs after receiving PICC and after arranging home health  Consultants:   ID  Procedures:     Antimicrobials: Anti-infectives (From admission, onward)   Start      Dose/Rate Route Frequency Ordered Stop   01/11/20 1400  ceFAZolin (ANCEF) IVPB 2g/100 mL premix     2 g 200 mL/hr over 30 Minutes Intravenous Every 8 hours 01/11/20 1303     01/11/20 0400  cefTRIAXone (ROCEPHIN) 2 g in sodium chloride 0.9 % 100 mL IVPB  Status:  Discontinued     2 g 200 mL/hr over 30 Minutes Intravenous Daily 01/11/20 0100 01/11/20 1305   01/11/20 0400  azithromycin (ZITHROMAX) 500 mg in sodium chloride 0.9 % 250 mL IVPB  Status:  Discontinued     500 mg 250 mL/hr over 60 Minutes Intravenous Daily 01/11/20 0100 01/11/20 1310   01/10/20 1945  aztreonam (AZACTAM) 1 g in sodium chloride 0.9 % 100 mL IVPB  Status:  Discontinued     1 g 200 mL/hr over 30 Minutes Intravenous  Once 01/10/20 1935 01/10/20 1943   01/10/20 1945  vancomycin (VANCOREADY) IVPB 1250 mg/250 mL     1,250 mg 166.7 mL/hr over 90 Minutes Intravenous STAT 01/10/20 1941 01/10/20 2324   01/10/20 1945  ceFEPIme (MAXIPIME) 2 g in sodium chloride 0.9 % 100 mL IVPB     2 g 200 mL/hr over 30 Minutes Intravenous STAT 01/10/20 1943 01/10/20 2114       Subjective: Feeling better. Eager to go home  Objective: Vitals:   01/12/20 0604 01/12/20 1421 01/12/20 2120 01/13/20 0423  BP: 128/68 (!) 109/46 (!) 139/56 (!) 123/57  Pulse: 71 71 79 65  Resp: 16 20 18 16   Temp: 98.6 F (37 C) 98.1 F (36.7 C) 98.1 F (36.7 C) 98.1 F (36.7 C)  TempSrc: Oral Oral Oral Oral  SpO2: 95% 97% 90% 93%  Weight:      Height:        Intake/Output Summary (Last 24 hours) at 01/13/2020 1635 Last data filed at 01/13/2020 0756 Gross per 24 hour  Intake 400 ml  Output 830 ml  Net -430 ml   Filed Weights   01/10/20 1732 01/11/20 2253  Weight: 59 kg 58 kg    Examination:  General exam: Appears calm and comfortable  Respiratory system: Clear to auscultation. Respiratory effort normal. Cardiovascular system: S1 & S2 heard, Regular Gastrointestinal system: Abdomen is nondistended, soft and nontender. No organomegaly or  masses felt. Normal bowel sounds heard. Central nervous system: Alert and oriented. No focal neurological deficits. Extremities: Symmetric 5 x 5 power. Skin: No rashes, lesions Psychiatry: Judgement and insight appear normal. Mood & affect appropriate.   Data Reviewed: I have personally reviewed following labs and imaging studies  CBC: Recent Labs  Lab 01/10/20 1723 01/11/20 0100 01/11/20 0622 01/12/20 0441 01/13/20 0529  WBC 5.8 4.7 6.5 5.7 4.7  NEUTROABS 4.4  --  4.8  --   --   HGB 13.9 10.9* 12.3 11.5* 12.1  HCT 43.4 34.4* 38.7 36.8 36.8  MCV 100.5* 103.0* 102.9* 101.9* 100.5*  PLT 216 159 176 187 [id number]   Basic Metabolic Panel: Recent Labs  Lab 01/10/20 1723 01/11/20 0100 01/11/20 0622 01/12/20 0441 01/13/20 0529  NA 138  --  142 142 141  K 3.2*  --  3.3* 3.4* 3.6  CL 104  --  104 106 105  CO2 24  --  27 28 27   GLUCOSE 179*  --  113* 108* 116*  BUN 12  --  14 16 11   CREATININE 0.62 0.62 0.49 0.56 0.52  CALCIUM 9.1  --  8.2* 8.8* 8.5*   GFR: Estimated Creatinine Clearance: 36.5 mL/min (by C-G formula based on SCr of 0.52 mg/dL). Liver Function Tests: Recent Labs  Lab 01/10/20 1723 01/11/20 0622 01/13/20 0529  AST 33 27 21  ALT 19 19 13   ALKPHOS 40 33* 36*  BILITOT 1.1 0.9 0.7  PROT 6.7 5.8* 5.7*  ALBUMIN 3.8 3.2* 2.9*   No results for input(s): LIPASE, AMYLASE in the last 168 hours. No results for input(s): AMMONIA in the last 168 hours. Coagulation Profile: Recent Labs  Lab 01/10/20 1723  INR 1.0   Cardiac Enzymes: No results for input(s): CKTOTAL, CKMB, CKMBINDEX, TROPONINI in the last 168 hours. BNP (last 3 results) No results for input(s): PROBNP in the last 8760 hours. HbA1C: Recent Labs    01/11/20 0627  HGBA1C 5.6   CBG: No results for input(s): GLUCAP in the last 168 hours. Lipid Profile: No results for input(s): CHOL, HDL, LDLCALC, TRIG, CHOLHDL, LDLDIRECT in the last 72 hours. Thyroid Function Tests: No results for input(s): TSH,  T4TOTAL, FREET4, T3FREE, THYROIDAB in the last 72 hours. Anemia Panel: No results for input(s): VITAMINB12, FOLATE, FERRITIN, TIBC, IRON, RETICCTPCT in the last 72 hours. Sepsis Labs: Recent Labs  Lab 01/10/20 1723 01/10/20 2152 01/11/20 0622 01/11/20 [id number]  PROCALCITON  --   --  0.10  --   LATICACIDVEN 1.9 1.0  --  0.8    Recent Results (from the past 240 hour(s))  Blood Culture (routine x 2)     Status: Abnormal   Collection Time: 01/10/20  5:23 PM   Specimen: BLOOD LEFT ARM  Result Value Ref Range Status   Specimen Description   Final    BLOOD LEFT ARM Performed at [facility], [place] [address], [place], [place] [address]    Special Requests   Final    BOTTLES DRAWN AEROBIC AND ANAEROBIC Blood Culture adequate volume Performed at [place] [address] [place], [place] [address]    Culture  Setup Time   Final    GRAM POSITIVE COCCI ANAEROBIC BOTTLE ONLY CRITICAL RESULT CALLED TO, READ BACK BY AND VERIFIED WITH: A JACKSON [id number] AT 1024 AM BY CM    Culture (A)  Final    STAPHYLOCOCCUS AUREUS SUSCEPTIBILITIES PERFORMED ON PREVIOUS CULTURE WITHIN THE LAST 5 DAYS. Performed at [facility], [place] [address] [place], [place] [address]    Report Status 01/13/2020 FINAL  Final  Urine culture     Status: Abnormal   Collection Time: 01/10/20  5:23 PM   Specimen: In/Out Cath Urine  Result Value Ref Range Status   Specimen Description   Final    IN/OUT CATH URINE Performed at [facility] [place] [address], [place], [place] [address]    Special Requests   Final    NONE Performed at [facility], [place] [address] [place], [place] [address]    Culture 10,000 COLONIES/mL STAPHYLOCOCCUS HAEMOLYTICUS (A)  Final   Report Status 01/13/2020 FINAL  Final   Organism ID, Bacteria STAPHYLOCOCCUS HAEMOLYTICUS (A)  Final      Susceptibility   Staphylococcus haemolyticus - MIC*    CIPROFLOXACIN >=8 RESISTANT  Resistant     GENTAMICIN <=0.5 SENSITIVE Sensitive     NITROFURANTOIN 32 SENSITIVE Sensitive     OXACILLIN >=4 RESISTANT Resistant     TETRACYCLINE <=1 SENSITIVE Sensitive     VANCOMYCIN <=0.5 SENSITIVE Sensitive     TRIMETH/SULFA <=10 SENSITIVE Sensitive     CLINDAMYCIN RESISTANT Resistant     RIFAMPIN <=0.5 SENSITIVE Sensitive     Inducible Clindamycin POSITIVE Resistant     * 10,000 COLONIES/mL STAPHYLOCOCCUS HAEMOLYTICUS  Respiratory Panel by RT PCR (Flu A&B, Covid) - Nasopharyngeal Swab     Status: None   Collection Time: 01/10/20  5:24 PM   Specimen: Nasopharyngeal Swab  Result Value Ref Range Status   SARS Coronavirus 2 by RT PCR NEGATIVE NEGATIVE Final    Comment: (NOTE) SARS-CoV-2 target nucleic acids are NOT DETECTED. The SARS-CoV-2 RNA is generally detectable in upper respiratoy specimens during the acute phase of infection. The lowest concentration of SARS-CoV-2 viral copies this assay can detect is 131 copies/mL. A negative result does not preclude SARS-Cov-2 infection and should not be used as the sole basis for treatment or other patient management decisions. A negative result may occur with  improper specimen collection/handling, submission of specimen other than nasopharyngeal swab, presence of viral mutation(s) within the areas targeted by this assay, and inadequate number of viral copies (<131 copies/mL). A negative result must be combined with clinical observations, patient history, and epidemiological information. The expected result is Negative. Fact Sheet for Patients:  PinkCheek.be Fact Sheet for Healthcare Providers:  GravelBags.it This test is not yet ap proved or cleared by the [place] FDA and  has been authorized for detection and/or diagnosis of SARS-CoV-2 by FDA under an Emergency Use Authorization (EUA). This EUA will remain  in effect (meaning this test can be used) for the duration of  the COVID-19 declaration under Section 564(b)(1) of the Act, 21 U.S.C. section 360bbb-3(b)(1), unless the authorization is terminated or revoked sooner.    Influenza A by PCR NEGATIVE NEGATIVE Final   Influenza  B by PCR NEGATIVE NEGATIVE Final    Comment: (NOTE) The Xpert Xpress SARS-CoV-2/FLU/RSV assay is intended as an aid in  the diagnosis of influenza from Nasopharyngeal swab specimens and  should not be used as a sole basis for treatment. Nasal washings and  aspirates are unacceptable for Xpert Xpress SARS-CoV-2/FLU/RSV  testing. Fact Sheet for Patients: PinkCheek.be Fact Sheet for Healthcare Providers: GravelBags.it This test is not yet approved or cleared by the [place] FDA and  has been authorized for detection and/or diagnosis of SARS-CoV-2 by  FDA under an Emergency Use Authorization (EUA). This EUA will remain  in effect (meaning this test can be used) for the duration of the  Covid-19 declaration under Section 564(b)(1) of the Act, 21  U.S.C. section 360bbb-3(b)(1), unless the authorization is  terminated or revoked. Performed at [facility], [place] [address] [place], [place] [address]   Blood Culture (routine x 2)     Status: Abnormal   Collection Time: 01/10/20  5:28 PM   Specimen: BLOOD LEFT HAND  Result Value Ref Range Status   Specimen Description   Final    BLOOD LEFT HAND Performed at [place] [address], [place], [place] [address]    Special Requests   Final    BOTTLES DRAWN AEROBIC AND ANAEROBIC Blood Culture adequate volume Performed at [place] [address] [place],  [place] [address]    Culture  Setup Time   Final    GRAM POSITIVE COCCI IN BOTH AEROBIC AND ANAEROBIC BOTTLES CRITICAL VALUE NOTED.  VALUE IS CONSISTENT WITH PREVIOUSLY REPORTED AND CALLED VALUE. Performed at [facility], [place] [address]  [place], [place] [address]    Culture STAPHYLOCOCCUS AUREUS (A)  Final   Report Status 01/13/2020 FINAL  Final   Organism ID, Bacteria STAPHYLOCOCCUS AUREUS  Final      Susceptibility   Staphylococcus aureus - MIC*    CIPROFLOXACIN >=8 RESISTANT Resistant     ERYTHROMYCIN <=0.25 SENSITIVE Sensitive     GENTAMICIN <=0.5 SENSITIVE Sensitive     OXACILLIN <=0.25 SENSITIVE Sensitive     TETRACYCLINE <=1 SENSITIVE Sensitive     VANCOMYCIN 1 SENSITIVE Sensitive     TRIMETH/SULFA <=10 SENSITIVE Sensitive     CLINDAMYCIN <=0.25 SENSITIVE Sensitive     RIFAMPIN <=0.5 SENSITIVE Sensitive     Inducible Clindamycin NEGATIVE Sensitive     * STAPHYLOCOCCUS AUREUS  Blood Culture ID Panel (Reflexed)     Status: Abnormal   Collection Time: 01/10/20  5:28 PM  Result Value Ref Range Status   Enterococcus species NOT DETECTED NOT DETECTED Final   Listeria monocytogenes NOT DETECTED NOT DETECTED Final   Staphylococcus species DETECTED (A) NOT DETECTED Final    Comment: CRITICAL RESULT CALLED TO, READ BACK BY AND VERIFIED WITH: Seleta Rhymes PharmD 12:30 01/11/20 ([email or web address])    Staphylococcus aureus (BCID) DETECTED (A) NOT DETECTED Final    Comment: Methicillin (oxacillin) susceptible Staphylococcus aureus (MSSA). Preferred therapy is anti staphylococcal beta lactam antibiotic (Cefazolin or Nafcillin), unless clinically contraindicated. CRITICAL RESULT CALLED TO, READ BACK BY AND VERIFIED WITH: Seleta Rhymes PharmD 12:30 01/11/20 (wilsonm)    Methicillin resistance NOT DETECTED NOT DETECTED Final   Streptococcus species NOT DETECTED NOT DETECTED Final   Streptococcus agalactiae NOT DETECTED NOT DETECTED Final   Streptococcus pneumoniae NOT DETECTED NOT DETECTED Final   Streptococcus pyogenes NOT DETECTED NOT DETECTED Final   Acinetobacter baumannii NOT DETECTED NOT DETECTED Final   Enterobacteriaceae  species NOT DETECTED NOT DETECTED Final   Enterobacter cloacae complex NOT DETECTED NOT DETECTED Final    Escherichia coli NOT DETECTED NOT DETECTED Final   Klebsiella oxytoca NOT DETECTED NOT DETECTED Final   Klebsiella pneumoniae NOT DETECTED NOT DETECTED Final   Proteus species NOT DETECTED NOT DETECTED Final   Serratia marcescens NOT DETECTED NOT DETECTED Final   Haemophilus influenzae NOT DETECTED NOT DETECTED Final   Neisseria meningitidis NOT DETECTED NOT DETECTED Final   Pseudomonas aeruginosa NOT DETECTED NOT DETECTED Final   Candida albicans NOT DETECTED NOT DETECTED Final   Candida glabrata NOT DETECTED NOT DETECTED Final   Candida krusei NOT DETECTED NOT DETECTED Final   Candida parapsilosis NOT DETECTED NOT DETECTED Final   Candida tropicalis NOT DETECTED NOT DETECTED Final    Comment: Performed at [facility], [place] [address] [place], [place] [address]  SARS CORONAVIRUS 2 (TAT 6-24 HRS) Nasopharyngeal Nasopharyngeal Swab     Status: None   Collection Time: 01/10/20  9:41 PM   Specimen: Nasopharyngeal Swab  Result Value Ref Range Status   SARS Coronavirus 2 NEGATIVE NEGATIVE Final    Comment: (NOTE) SARS-CoV-2 target nucleic acids are NOT DETECTED. The SARS-CoV-2 RNA is generally detectable in upper and lower respiratory specimens during the acute phase of infection. Negative results do not preclude SARS-CoV-2 infection, do not rule out co-infections with other pathogens, and should not be used as the sole basis for treatment or other patient management decisions. Negative results must be combined with clinical observations, patient history, and epidemiological information. The expected result is Negative. Fact Sheet for Patients: SugarRoll.be Fact Sheet for Healthcare Providers: https://www.woods-mathews.com/ This test is not yet approved or cleared by the [place] FDA and  has been authorized for detection and/or diagnosis of SARS-CoV-2 by FDA under an Emergency Use Authorization (EUA). This EUA will remain  in effect  (meaning this test can be used) for the duration of the COVID-19 declaration under Section 56 4(b)(1) of the Act, 21 U.S.C. section 360bbb-3(b)(1), unless the authorization is terminated or revoked sooner. Performed at [facility], [place] [address], [place], [place] [address]   Culture, blood (routine x 2)     Status: None (Preliminary result)   Collection Time: 01/12/20  4:41 AM   Specimen: BLOOD RIGHT HAND  Result Value Ref Range Status   Specimen Description   Final    BLOOD RIGHT HAND Performed at [place] [address] [place], [place] [address]    Special Requests   Final    BOTTLES DRAWN AEROBIC ONLY Blood Culture adequate volume Performed at [place] [address], [place], [place] [address]    Culture   Final    NO GROWTH 1 DAY Performed at [facility], [place] [address], [place], [place] [address]    Report Status PENDING  Incomplete     Radiology Studies: ECHOCARDIOGRAM COMPLETE  Result Date: 01/12/2020   ECHOCARDIOGRAM REPORT   Patient Name:   DREENA BIEDRON Date of Exam: 01/12/2020 Medical Rec #:  [id number]           Height:       60.0 in Accession #:    [id number]          Weight:       127.9 lb Date of Birth:  08-Apr-1926           BSA:          1.54  m Patient Age:    36 years            BP:           128/68 mmHg Patient Gender: F                   HR:           70 bpm. Exam Location:  Inpatient Procedure: 2D Echo, Cardiac Doppler and Color Doppler Indications:    Bacteremia 790.7  History:        Patient has prior history of Echocardiogram examinations, most                 recent 09/04/2017. CAD; Stroke. Thoracic Aortic Aneurysm.  Sonographer:    Jonelle Sidle Dance Referring Phys: [place]  1. Left ventricular ejection fraction, by visual estimation, is 60 to 65%. The left ventricle has normal function. There is borderline left ventricular hypertrophy.  2. Elevated left atrial pressure.  3.  Left ventricular diastolic parameters are consistent with Grade I diastolic dysfunction (impaired relaxation).  4. The left ventricle has no regional wall motion abnormalities.  5. Global right ventricle has normal systolic function.The right ventricular size is normal. No increase in right ventricular wall thickness.  6. Left atrial size was mildly dilated.  7. Right atrial size was normal.  8. Moderate thickening of the mitral valve leaflet(s).  9. Moderate to severe mitral annular calcification. 10. The mitral valve is degenerative. Mild to moderate mitral valve regurgitation. No evidence of mitral stenosis. 11. The tricuspid valve is normal in structure. 12. The aortic valve is tricuspid. Aortic valve regurgitation is not visualized. Mild to moderate aortic valve sclerosis/calcification without any evidence of aortic stenosis. 13. The pulmonic valve was normal in structure. Pulmonic valve regurgitation is not visualized. 14. Mildly elevated pulmonary artery systolic pressure. 15. No vegetations are seen, consider TEE if the suspicion for endocarditis is high. FINDINGS  Left Ventricle: Left ventricular ejection fraction, by visual estimation, is 60 to 65%. The left ventricle has normal function. The left ventricle has no regional wall motion abnormalities. There is borderline left ventricular hypertrophy. Left ventricular diastolic parameters are consistent with Grade I diastolic dysfunction (impaired relaxation). Elevated left atrial pressure. Right Ventricle: The right ventricular size is normal. No increase in right ventricular wall thickness. Global RV systolic function is has normal systolic function. The tricuspid regurgitant velocity is 2.85 m/s, and with an assumed right atrial pressure  of 3 mmHg, the estimated right ventricular systolic pressure is mildly elevated at 35.5 mmHg. Left Atrium: Left atrial size was mildly dilated. Right Atrium: Right atrial size was normal in size Pericardium: There is no  evidence of pericardial effusion. Mitral Valve: The mitral valve is degenerative in appearance. There is moderate thickening of the mitral valve leaflet(s). Moderate to severe mitral annular calcification. Mild to moderate mitral valve regurgitation, with centrally-directed jet. No evidence of mitral valve stenosis by observation. MV peak gradient, 12.8 mmHg. Tricuspid Valve: The tricuspid valve is normal in structure. Tricuspid valve regurgitation is mild. Aortic Valve: The aortic valve is tricuspid. Aortic valve regurgitation is not visualized. Mild to moderate aortic valve sclerosis/calcification is present, without any evidence of aortic stenosis. Pulmonic Valve: The pulmonic valve was normal in structure. Pulmonic valve regurgitation is not visualized. Pulmonic regurgitation is not visualized. Aorta: The aortic root is normal in size and structure. IAS/Shunts: No atrial level shunt detected by color flow Doppler.  LEFT VENTRICLE PLAX 2D LVIDd:  3.60 cm  Diastology LVIDs:         2.50 cm  LV e' lateral:   6.85 cm/s LV PW:         1.23 cm  LV E/e' lateral: 19.0 LV IVS:        1.10 cm  LV e' medial:    5.11 cm/s LVOT diam:     2.10 cm  LV E/e' medial:  25.4 LV SV:         32 ml LV SV Index:   20.32 LVOT Area:     3.46 cm  RIGHT VENTRICLE             IVC RV Basal diam:  2.60 cm     IVC diam: 1.80 cm RV S prime:     14.90 cm/s TAPSE (M-mode): 1.9 cm LEFT ATRIUM             Index       RIGHT ATRIUM          Index LA diam:        4.00 cm 2.59 cm/m  RA Area:     7.89 cm LA Vol (A2C):   93.5 ml 60.58 ml/m RA Volume:   9.84 ml  6.38 ml/m LA Vol (A4C):   71.1 ml 46.07 ml/m LA Biplane Vol: 86.0 ml 55.72 ml/m  AORTIC VALVE LVOT Vmax:   139.00 cm/s LVOT Vmean:  95.700 cm/s LVOT VTI:    0.339 m  AORTA Ao Root diam: 3.20 cm Ao Asc diam:  3.60 cm MITRAL VALVE                         TRICUSPID VALVE MV Area (PHT): 2.34 cm              TR Peak grad:   32.5 mmHg MV Peak grad:  12.8 mmHg             TR Vmax:         285.00 cm/s MV Mean grad:  4.0 mmHg MV Vmax:       1.79 m/s              SHUNTS MV Vmean:      94.0 cm/s             Systemic VTI:  0.34 m MV VTI:        0.55 m                Systemic Diam: 2.10 cm MV PHT:        93.96 msec MV Decel Time: 324 msec MV E velocity: 130.00 cm/s 103 cm/s MV A velocity: 143.00 cm/s 70.3 cm/s MV E/A ratio:  0.91        1.5  Mihai Croitoru MD Electronically signed by Sanda Klein MD Signature Date/Time: 01/12/2020/5:28:10 PM    Final     Scheduled Meds: . clopidogrel  75 mg Oral Daily  . donepezil  10 mg Oral QHS  . enoxaparin (LOVENOX) injection  40 mg Subcutaneous Q24H  . famotidine  20 mg Oral BID  . feeding supplement (ENSURE ENLIVE)  237 mL Oral BID BM  . memantine  10 mg Oral Daily  . multivitamin with minerals  1 tablet Oral Daily   Continuous Infusions: .  ceFAZolin (ANCEF) IV Stopped (01/13/20 0553)     LOS: 3 days   Marylu Lund, MD [facility] Pager On Amion  If 7PM-7AM, please contact night-coverage  01/13/2020, 4:35 PM

## 2020-01-13 NOTE — TOC Progression Note (Signed)
Transition of Care [facility]) - Progression Note    Patient Details  Name: [name] MRN: [id number] Date of Birth: August 06, 1926  [facility]) CM/SW Contact  Alixander Rallis, Juliann Pulse, RN Phone Number: 01/13/2020, 2:28 PM  Clinical Narrative:checking on [facility] agency with a [facility] for tomorrow, & also with health insurance.HHRN-iv abx,HHPT needed. Pam w/[facility] rep is following for initial teaching & med.[name] will have patient stay with her @ home [address] [place].       Expected Discharge Plan: [place] Barriers to Discharge: Continued Medical Work up  Expected Discharge Plan and Services Expected Discharge Plan: [place]   Discharge Planning Services: CM Consult   Living arrangements for the past 2 months: Assisted Living Facility                           HH Arranged: RN, PT           Social Determinants of Health (SDOH) Interventions    Readmission Risk Interventions No flowsheet data found.

## 2020-01-13 NOTE — TOC Progression Note (Signed)
Transition of Care [facility]) - Progression Note    Patient Details  Name: [name] MRN: [id number] Date of Birth: 05/13/1926  [facility] CM/SW Contact  Haely Leyland, Juliann Pulse, RN Phone Number: 01/13/2020, 3:10 PM  Clinical Narrative: [facility] for HHRN-iv abx/HHPT. Awaiting midline.      Expected Discharge Plan: [place] Barriers to Discharge: Continued Medical Work up  Expected Discharge Plan and Services Expected Discharge Plan: [place]   Discharge Planning Services: CM Consult   Living arrangements for the past 2 months: Assisted Living Facility                           HH Arranged: RN, PT           Social Determinants of Health (SDOH) Interventions    Readmission Risk Interventions No flowsheet data found.

## 2020-01-14 MED ORDER — CEPHALEXIN 500 MG PO CAPS: 500.0000 mg | ORAL_CAPSULE | Freq: Four times a day (QID) | ORAL | 0 refills | Status: AC

## 2020-01-14 MED ORDER — CEFAZOLIN IV (FOR PTA / DISCHARGE USE ONLY): 2.0000 g | Freq: Three times a day (TID) | INTRAVENOUS | 0 refills | Status: AC

## 2020-01-14 NOTE — Discharge Summary (Signed)
Physician Discharge Summary  AUDRIELLA CREGAR [id number] DOB: Jun 14, 1926 DOA: 01/10/2020  PCP: Robyne Peers, MD  Admit date: 01/10/2020 Discharge date: 01/14/2020  Admitted From: Home Disposition:  Home  Recommendations for Outpatient Follow-up:  1. Follow up with PCP in 1-2 weeks 2. Routine midline care, please d/c midline after completing IV ancef on 1/24  Home Health:PT, RN     Discharge Condition:Improved CODE STATUS:DNR Diet recommendation: Regular   Brief/Interim Summary: 86 year old female with prior TIA, nonobstructive CAD, dementia who was brought in from her assisted living facility due to fever and confusion. Patient received COVID-19 vaccine dose #1 on 1/8. Daughter reports her episode of rectal bleeding about 7 days ago and she was treated with ciprofloxacin and metronidazole for possible colitis. The day prior to admission patient became more confused and had a fever of 101, and she was brought to the hospital.She was admitted with sepsis without a clear source but eventually blood cultures came back positive for MSSA. ID consulted   Discharge Diagnoses:  Principal Problem:   Sepsis ([place]) Active Problems:   CAD (coronary artery disease)   Aortic aneurysm, thoracic ([facility])   HTN (hypertension)   Alzheimer's disease ([place])   CAP (community acquired pneumonia)   Principal Problem MSSA bacteremia,sepsis ruled out -No clear source, no skin lesions/boils, no rashes -ID consulted and following -TTE neg for vegetations -ID recommends 2 weeks of ancef followed by 4 more weeks of keflex -Placed on Ancef, tolerating.  Active Problems Acute metabolic encephalopathy -Likely from bacteremia, resolved and she appears back to baseline -Stable at this time -Daughter at bedside stated mentation now much improved  History of TIA -Continue home Plavix as tolerated  History of dementia -Continue Aricept, Namenda as tolerated -Stable at this  time  History of hypertension -blood pressure normal   Discharge Instructions  Discharge Instructions    Home infusion instructions   Complete by: As directed    Instructions: Flushing of vascular access device: 0.9% NaCl pre/post medication administration and prn patency; Heparin 100 u/ml, 35ml for implanted ports and Heparin 10u/ml, 48ml for all other central venous catheters.     Allergies as of 01/14/2020      Reactions   Aspirin Other (See Comments)   Feels like choking   Crestor [rosuvastatin] Other (See Comments)   Lower Extremity Myalgia Lower ext myalgia   Bactrim Other (See Comments)   Unknown   Demeclocycline Rash   Niacin Other (See Comments)   Turns Face Red   Niacin And Related Other (See Comments)   Turns red in the face   Penicillins Other (See Comments)   Unknown Reaction Per The Patient Unknown   Relafen [nabumetone] Other (See Comments)   Unknown Reaction Per The Patient Unknown   Sulfa Antibiotics Other (See Comments)   Unknown Reaction Per The Patient Unknown   Tetracyclines & Related Rash      Medication List    STOP taking these medications   ciprofloxacin 500 MG tablet Commonly known as: CIPRO   metroNIDAZOLE 500 MG tablet Commonly known as: FLAGYL     TAKE these medications   acetaminophen 500 MG tablet Commonly known as: TYLENOL Take 1,000 mg by mouth 3 (three) times daily.   Calcium-Vitamin D 500-400 MG-UNIT Tabs Take 1 tablet by mouth 2 (two) times daily.   ceFAZolin  IVPB Commonly known as: ANCEF Inject 2 g into the vein every 8 (eight) hours for 10 days. Indication: MSSA bacteremia Last Day of Therapy:  01/24/2020 Labs -  Once weekly:  CBC/D and BMP   Centrum Silver Chew Chew 1 tablet by mouth daily.   cephALEXin 500 MG capsule Commonly known as: KEFLEX Take 1 capsule (500 mg total) by mouth 4 (four) times daily for 28 days. Start taking on: January 25, 2020   cholecalciferol 1000 units tablet Commonly known as:  VITAMIN D Take 2,000 Units by mouth daily.   clopidogrel 75 MG tablet Commonly known as: PLAVIX Take 75 mg by mouth daily.   donepezil 10 MG tablet Commonly known as: Aricept Take 1 tablet (10 mg total) by mouth at bedtime. Start 1/2 tablet daily x 4 weeks and then 1 tablet daily   famotidine 20 MG tablet Commonly known as: PEPCID Take 20 mg by mouth 2 (two) times daily.   hydrocortisone 2.5 % rectal cream Commonly known as: ANUSOL-HC Place 1 application rectally 2 (two) times daily as needed for hemorrhoids or anal itching.   Proctozone-HC 2.5 % rectal cream Generic drug: hydrocortisone Place 1 application rectally 2 (two) times daily as needed for hemorrhoids or anal itching.   loperamide 2 MG capsule Commonly known as: IMODIUM Take 2 mg by mouth as needed for diarrhea or loose stools.   LORazepam 0.5 MG tablet Commonly known as: ATIVAN Take 1 tablet (0.5 mg total) by mouth 2 (two) times daily as needed for anxiety.   memantine 10 MG tablet Commonly known as: NAMENDA Take 10 mg by mouth 2 (two) times daily.   methocarbamol 500 MG tablet Commonly known as: ROBAXIN Take 500 mg by mouth 3 (three) times daily as needed for muscle spasms.   polyethylene glycol powder 17 GM/SCOOP powder Commonly known as: GLYCOLAX/MIRALAX Take 17 g by mouth daily as needed for mild constipation.   potassium chloride SA 20 MEQ tablet Commonly known as: KLOR-CON Take 20 mEq by mouth 3 (three) times a week.   SYSTANE BALANCE OP Apply 2 drops to eye 2 (two) times daily.   SYSTANE OP Apply 1-2 drops to eye 2 (two) times daily.   traMADol 50 MG tablet Commonly known as: ULTRAM Take 50 mg by mouth every 8 (eight) hours as needed for moderate pain or severe pain.            Home Infusion Instuctions  (From admission, onward)         Start     Ordered   01/14/20 0000  Home infusion instructions    Question:  Instructions  Answer:  Flushing of vascular access device: 0.9% NaCl  pre/post medication administration and prn patency; Heparin 100 u/ml, 73ml for implanted ports and Heparin 10u/ml, 21ml for all other central venous catheters.   01/14/20 1343         Follow-up Information    Robyne Peers, MD. Schedule an appointment as soon as possible for a visit in 2 week(s).   Specialty: Family Medicine Contact information: [id number] [place] [address] [phone]        [facility] Follow up.   Why: IV abx,supplies       [facility] Follow up.   Specialty: Home Health Services Why: [facility] nursing/physical therapy Contact information: [place] [id number] [phone]          Allergies  Allergen Reactions  . Aspirin Other (See Comments)    Feels like choking  . Crestor [Rosuvastatin] Other (See Comments)    Lower Extremity Myalgia Lower ext myalgia  . Bactrim Other (See Comments)  Unknown  . Demeclocycline Rash  . Niacin Other (See Comments)    Turns Face Red  . Niacin And Related Other (See Comments)    Turns red in the face  . Penicillins Other (See Comments)    Unknown Reaction Per The Patient Unknown  . Relafen [Nabumetone] Other (See Comments)    Unknown Reaction Per The Patient Unknown  . Sulfa Antibiotics Other (See Comments)    Unknown Reaction Per The Patient Unknown  . Tetracyclines & Related Rash    Consultations:  ID  Procedures/Studies: CT HEAD WO CONTRAST  Result Date: 01/11/2020 CLINICAL DATA:  Ataxia. EXAM: CT HEAD WITHOUT CONTRAST TECHNIQUE: Contiguous axial images were obtained from the base of the skull through the vertex without intravenous contrast. COMPARISON:  March 02, 2019. FINDINGS: Brain: Mild diffuse cortical atrophy is noted. Mild chronic ischemic white matter disease is noted. No mass effect or midline shift is noted. Ventricular size is within normal limits. There is no evidence of mass lesion, hemorrhage or acute infarction. Vascular: No hyperdense  vessel or unexpected calcification. Skull: Normal. Negative for fracture or focal lesion. Sinuses/Orbits: No acute finding. Other: None. IMPRESSION: Mild diffuse cortical atrophy. Mild chronic ischemic white matter disease. No acute intracranial abnormality seen. Electronically Signed   By: Marijo Conception M.D.   On: 01/11/2020 07:37   DG Chest Port 1 View  Result Date: 01/10/2020 CLINICAL DATA:  Hypoxia. EXAM: PORTABLE CHEST 1 VIEW COMPARISON:  January 10, 2020 FINDINGS: Minimal atelectasis in the left base. The heart, hila, mediastinum, lungs, and pleura are otherwise unremarkable. IMPRESSION: Minimal atelectasis in the left base.  No other acute abnormalities. Electronically Signed   By: Dorise Bullion III M.D   On: 01/10/2020 18:03   ECHOCARDIOGRAM COMPLETE  Result Date: 01/12/2020   ECHOCARDIOGRAM REPORT   Patient Name:   ESSIEMAE MATER Date of Exam: 01/12/2020 Medical Rec #:  [id number]           Height:       60.0 in Accession #:    [id number]          Weight:       127.9 lb Date of Birth:  [birth year]           BSA:          1.54 m Patient Age:    86 years            BP:           128/68 mmHg Patient Gender: F                   HR:           70 bpm. Exam Location:  Inpatient Procedure: 2D Echo, Cardiac Doppler and Color Doppler Indications:    Bacteremia 790.7  History:        Patient has prior history of Echocardiogram examinations, most                 recent 09/04/2017. CAD; Stroke. Thoracic Aortic Aneurysm.  Sonographer:    Jonelle Sidle Dance Referring Phys: [place]  1. Left ventricular ejection fraction, by visual estimation, is 60 to 65%. The left ventricle has normal function. There is borderline left ventricular hypertrophy.  2. Elevated left atrial pressure.  3. Left ventricular diastolic parameters are consistent with Grade I diastolic dysfunction (impaired relaxation).  4. The left ventricle has no regional wall motion abnormalities.  5. Global right ventricle has  normal systolic  function.The right ventricular size is normal. No increase in right ventricular wall thickness.  6. Left atrial size was mildly dilated.  7. Right atrial size was normal.  8. Moderate thickening of the mitral valve leaflet(s).  9. Moderate to severe mitral annular calcification. 10. The mitral valve is degenerative. Mild to moderate mitral valve regurgitation. No evidence of mitral stenosis. 11. The tricuspid valve is normal in structure. 12. The aortic valve is tricuspid. Aortic valve regurgitation is not visualized. Mild to moderate aortic valve sclerosis/calcification without any evidence of aortic stenosis. 13. The pulmonic valve was normal in structure. Pulmonic valve regurgitation is not visualized. 14. Mildly elevated pulmonary artery systolic pressure. 15. No vegetations are seen, consider TEE if the suspicion for endocarditis is high. FINDINGS  Left Ventricle: Left ventricular ejection fraction, by visual estimation, is 60 to 65%. The left ventricle has normal function. The left ventricle has no regional wall motion abnormalities. There is borderline left ventricular hypertrophy. Left ventricular diastolic parameters are consistent with Grade I diastolic dysfunction (impaired relaxation). Elevated left atrial pressure. Right Ventricle: The right ventricular size is normal. No increase in right ventricular wall thickness. Global RV systolic function is has normal systolic function. The tricuspid regurgitant velocity is 2.85 m/s, and with an assumed right atrial pressure  of 3 mmHg, the estimated right ventricular systolic pressure is mildly elevated at 35.5 mmHg. Left Atrium: Left atrial size was mildly dilated. Right Atrium: Right atrial size was normal in size Pericardium: There is no evidence of pericardial effusion. Mitral Valve: The mitral valve is degenerative in appearance. There is moderate thickening of the mitral valve leaflet(s). Moderate to severe mitral annular calcification.  Mild to moderate mitral valve regurgitation, with centrally-directed jet. No evidence of mitral valve stenosis by observation. MV peak gradient, 12.8 mmHg. Tricuspid Valve: The tricuspid valve is normal in structure. Tricuspid valve regurgitation is mild. Aortic Valve: The aortic valve is tricuspid. Aortic valve regurgitation is not visualized. Mild to moderate aortic valve sclerosis/calcification is present, without any evidence of aortic stenosis. Pulmonic Valve: The pulmonic valve was normal in structure. Pulmonic valve regurgitation is not visualized. Pulmonic regurgitation is not visualized. Aorta: The aortic root is normal in size and structure. IAS/Shunts: No atrial level shunt detected by color flow Doppler.  LEFT VENTRICLE PLAX 2D LVIDd:         3.60 cm  Diastology LVIDs:         2.50 cm  LV e' lateral:   6.85 cm/s LV PW:         1.23 cm  LV E/e' lateral: 19.0 LV IVS:        1.10 cm  LV e' medial:    5.11 cm/s LVOT diam:     2.10 cm  LV E/e' medial:  25.4 LV SV:         32 ml LV SV Index:   20.32 LVOT Area:     3.46 cm  RIGHT VENTRICLE             IVC RV Basal diam:  2.60 cm     IVC diam: 1.80 cm RV S prime:     14.90 cm/s TAPSE (M-mode): 1.9 cm LEFT ATRIUM             Index       RIGHT ATRIUM          Index LA diam:        4.00 cm 2.59 cm/m  RA Area:     7.89  cm LA Vol (A2C):   93.5 ml 60.58 ml/m RA Volume:   9.84 ml  6.38 ml/m LA Vol (A4C):   71.1 ml 46.07 ml/m LA Biplane Vol: 86.0 ml 55.72 ml/m  AORTIC VALVE LVOT Vmax:   139.00 cm/s LVOT Vmean:  95.700 cm/s LVOT VTI:    0.339 m  AORTA Ao Root diam: 3.20 cm Ao Asc diam:  3.60 cm MITRAL VALVE                         TRICUSPID VALVE MV Area (PHT): 2.34 cm              TR Peak grad:   32.5 mmHg MV Peak grad:  12.8 mmHg             TR Vmax:        285.00 cm/s MV Mean grad:  4.0 mmHg MV Vmax:       1.79 m/s              SHUNTS MV Vmean:      94.0 cm/s             Systemic VTI:  0.34 m MV VTI:        0.55 m                Systemic Diam: 2.10 cm MV PHT:         93.96 msec MV Decel Time: 324 msec MV E velocity: 130.00 cm/s 103 cm/s MV A velocity: 143.00 cm/s 70.3 cm/s MV E/A ratio:  0.91        1.5  Mihai Croitoru MD Electronically signed by Sanda Klein MD Signature Date/Time: 01/12/2020/5:28:10 PM    Final    CT RENAL STONE STUDY  Result Date: 01/11/2020 CLINICAL DATA:  Fever and confusion EXAM: CT ABDOMEN AND PELVIS WITHOUT CONTRAST TECHNIQUE: Multidetector CT imaging of the abdomen and pelvis was performed following the standard protocol without IV contrast. COMPARISON:  05/10/2015 FINDINGS: Lower chest: Coronary atherosclerosis. Mitral annular calcification. Atelectasis in the lower lungs with bronchiectasis in airways at the right anterior base. Hepatobiliary: No focal liver abnormality.No evidence of biliary obstruction or stone. Pancreas: Unremarkable. Spleen: Generalized atrophy. Adrenals/Urinary Tract: Negative adrenals. No hydronephrosis or ureteral stone. Small cystic density in the left kidney. Mild excavation/scar-like appearance of the right kidney. Small calcification at the left renal hilum appears vascular on reformats. Unremarkable bladder. Stomach/Bowel:  No obstruction. No appendicitis. Vascular/Lymphatic: No acute vascular abnormality. No mass or adenopathy. Reproductive:Hysterectomy Other: No ascites or pneumoperitoneum. Musculoskeletal: L1 compression fracture with moderate height loss. Anteriorly a fracture plane is still seen but this has an overall chronic appearance. Remote T12 and L3 superior endplate fractures. Laminectomy at the thoracolumbar junction with spinal catheter fragment. L5-S1 anterolisthesis. IMPRESSION: 1. No acute finding.  No evident urinary tract infection. 2. Nonacute appearing compression fractures at T12, L1, and L3 that are new since 2016. 3. Multiple incidental findings noted above. Electronically Signed   By: Monte Fantasia M.D.   On: 01/11/2020 07:51    Subjective: Eager to go home  Discharge  Exam: Vitals:   01/13/20 2017 01/14/20 0424  BP: (!) 146/57 (!) 150/63  Pulse: 74 67  Resp: 18 16  Temp: 97.8 F (36.6 C) 98.7 F (37.1 C)  SpO2: 94% 93%   Vitals:   01/12/20 2120 01/13/20 0423 01/13/20 2017 01/14/20 0424  BP: (!) 139/56 (!) 123/57 (!) 146/57 (!) 150/63  Pulse: 79 65 74  67  Resp: 18 16 18 16   Temp: 98.1 F (36.7 C) 98.1 F (36.7 C) 97.8 F (36.6 C) 98.7 F (37.1 C)  TempSrc: Oral Oral Oral Oral  SpO2: 90% 93% 94% 93%  Weight:      Height:        General: Pt is alert, awake, not in acute distress Cardiovascular: RRR, S1/S2 +, no rubs, no gallops Respiratory: CTA bilaterally, no wheezing, no rhonchi Abdominal: Soft, NT, ND, bowel sounds + Extremities: no edema, no cyanosis   The results of significant diagnostics from this hospitalization (including imaging, microbiology, ancillary and laboratory) are listed below for reference.     Microbiology: Recent Results (from the past 240 hour(s))  Blood Culture (routine x 2)     Status: Abnormal   Collection Time: 01/10/20  5:23 PM   Specimen: BLOOD LEFT ARM  Result Value Ref Range Status   Specimen Description   Final    BLOOD LEFT ARM Performed at [facility], [address] [place], [place] [address]    Special Requests   Final    BOTTLES DRAWN AEROBIC AND ANAEROBIC Blood Culture adequate volume Performed at [facility] [address] [place], [place] [address]    Culture  Setup Time   Final    GRAM POSITIVE COCCI ANAEROBIC BOTTLE ONLY CRITICAL RESULT CALLED TO, READ BACK BY AND VERIFIED WITH: A JACKSON [id number] AT 1024 AM BY CM    Culture (A)  Final    STAPHYLOCOCCUS AUREUS SUSCEPTIBILITIES PERFORMED ON PREVIOUS CULTURE WITHIN THE LAST 5 DAYS. Performed at [facility], [place] [address] [place], [place] [address]    Report Status 01/13/2020 FINAL  Final  Urine culture     Status: Abnormal   Collection Time: 01/10/20  5:23 PM   Specimen: In/Out Cath Urine   Result Value Ref Range Status   Specimen Description   Final    IN/OUT CATH URINE Performed at [facility] [address] [place], [place] [address]    Special Requests   Final    NONE Performed at [facility], [place] [address] [place], [place] [address]    Culture 10,000 COLONIES/mL STAPHYLOCOCCUS HAEMOLYTICUS (A)  Final   Report Status 01/13/2020 FINAL  Final   Organism ID, Bacteria STAPHYLOCOCCUS HAEMOLYTICUS (A)  Final      Susceptibility   Staphylococcus haemolyticus - MIC*    CIPROFLOXACIN >=8 RESISTANT Resistant     GENTAMICIN <=0.5 SENSITIVE Sensitive     NITROFURANTOIN 32 SENSITIVE Sensitive     OXACILLIN >=4 RESISTANT Resistant     TETRACYCLINE <=1 SENSITIVE Sensitive     VANCOMYCIN <=0.5 SENSITIVE Sensitive     TRIMETH/SULFA <=10 SENSITIVE Sensitive     CLINDAMYCIN RESISTANT Resistant     RIFAMPIN <=0.5 SENSITIVE Sensitive     Inducible Clindamycin POSITIVE Resistant     * 10,000 COLONIES/mL STAPHYLOCOCCUS HAEMOLYTICUS  Respiratory Panel by RT PCR (Flu A&B, Covid) - Nasopharyngeal Swab     Status: None   Collection Time: 01/10/20  5:24 PM   Specimen: Nasopharyngeal Swab  Result Value Ref Range Status   SARS Coronavirus 2 by RT PCR NEGATIVE NEGATIVE Final    Comment: (NOTE) SARS-CoV-2 target nucleic acids are NOT DETECTED. The SARS-CoV-2 RNA is generally detectable in upper respiratoy specimens during the acute phase of infection. The lowest concentration of SARS-CoV-2 viral copies this assay can detect is 131 copies/mL. A negative result does not preclude SARS-Cov-2 infection  and should not be used as the sole basis for treatment or other patient management decisions. A negative result may occur with  improper specimen collection/handling, submission of specimen other than nasopharyngeal swab, presence of viral mutation(s) within the areas targeted by this assay, and inadequate number of viral copies (<131 copies/mL). A  negative result must be combined with clinical observations, patient history, and epidemiological information. The expected result is Negative. Fact Sheet for Patients:  PinkCheek.be Fact Sheet for Healthcare Providers:  GravelBags.it This test is not yet ap proved or cleared by the [place] FDA and  has been authorized for detection and/or diagnosis of SARS-CoV-2 by FDA under an Emergency Use Authorization (EUA). This EUA will remain  in effect (meaning this test can be used) for the duration of the COVID-19 declaration under Section 564(b)(1) of the Act, 21 U.S.C. section 360bbb-3(b)(1), unless the authorization is terminated or revoked sooner.    Influenza A by PCR NEGATIVE NEGATIVE Final   Influenza B by PCR NEGATIVE NEGATIVE Final    Comment: (NOTE) The Xpert Xpress SARS-CoV-2/FLU/RSV assay is intended as an aid in  the diagnosis of influenza from Nasopharyngeal swab specimens and  should not be used as a sole basis for treatment. Nasal washings and  aspirates are unacceptable for Xpert Xpress SARS-CoV-2/FLU/RSV  testing. Fact Sheet for Patients: PinkCheek.be Fact Sheet for Healthcare Providers: GravelBags.it This test is not yet approved or cleared by the [place] FDA and  has been authorized for detection and/or diagnosis of SARS-CoV-2 by  FDA under an Emergency Use Authorization (EUA). This EUA will remain  in effect (meaning this test can be used) for the duration of the  Covid-19 declaration under Section 564(b)(1) of the Act, 21  U.S.C. section 360bbb-3(b)(1), unless the authorization is  terminated or revoked. Performed at [facility], [place] [address], [place], [place] [address]   Blood Culture (routine x 2)     Status: Abnormal   Collection Time: 01/10/20  5:28 PM   Specimen: BLOOD LEFT HAND  Result Value Ref Range Status    Specimen Description   Final    BLOOD LEFT HAND Performed at [facility] [address], [place], [place] [address]    Special Requests   Final    BOTTLES DRAWN AEROBIC AND ANAEROBIC Blood Culture adequate volume Performed at [facility] [address], [place], [place] [address]    Culture  Setup Time   Final    GRAM POSITIVE COCCI IN BOTH AEROBIC AND ANAEROBIC BOTTLES CRITICAL VALUE NOTED.  VALUE IS CONSISTENT WITH PREVIOUSLY REPORTED AND CALLED VALUE. Performed at [facility], [place] [address], [place], [place] [address]    Culture STAPHYLOCOCCUS AUREUS (A)  Final   Report Status 01/13/2020 FINAL  Final   Organism ID, Bacteria STAPHYLOCOCCUS AUREUS  Final      Susceptibility   Staphylococcus aureus - MIC*    CIPROFLOXACIN >=8 RESISTANT Resistant     ERYTHROMYCIN <=0.25 SENSITIVE Sensitive     GENTAMICIN <=0.5 SENSITIVE Sensitive     OXACILLIN <=0.25 SENSITIVE Sensitive     TETRACYCLINE <=1 SENSITIVE Sensitive     VANCOMYCIN 1 SENSITIVE Sensitive     TRIMETH/SULFA <=10 SENSITIVE Sensitive     CLINDAMYCIN <=0.25 SENSITIVE Sensitive     RIFAMPIN <=0.5 SENSITIVE Sensitive     Inducible Clindamycin NEGATIVE Sensitive     * STAPHYLOCOCCUS AUREUS  Blood Culture ID Panel (Reflexed)     Status: Abnormal  Collection Time: 01/10/20  5:28 PM  Result Value Ref Range Status   Enterococcus species NOT DETECTED NOT DETECTED Final   Listeria monocytogenes NOT DETECTED NOT DETECTED Final   Staphylococcus species DETECTED (A) NOT DETECTED Final    Comment: CRITICAL RESULT CALLED TO, READ BACK BY AND VERIFIED WITH: Seleta Rhymes PharmD 12:30 01/11/20 ([email or web address])    Staphylococcus aureus (BCID) DETECTED (A) NOT DETECTED Final    Comment: Methicillin (oxacillin) susceptible Staphylococcus aureus (MSSA). Preferred therapy is anti staphylococcal beta lactam antibiotic (Cefazolin or Nafcillin), unless clinically contraindicated. CRITICAL RESULT CALLED  TO, READ BACK BY AND VERIFIED WITH: Seleta Rhymes PharmD 12:30 01/11/20 ([email or web address])    Methicillin resistance NOT DETECTED NOT DETECTED Final   Streptococcus species NOT DETECTED NOT DETECTED Final   Streptococcus agalactiae NOT DETECTED NOT DETECTED Final   Streptococcus pneumoniae NOT DETECTED NOT DETECTED Final   Streptococcus pyogenes NOT DETECTED NOT DETECTED Final   Acinetobacter baumannii NOT DETECTED NOT DETECTED Final   Enterobacteriaceae species NOT DETECTED NOT DETECTED Final   Enterobacter cloacae complex NOT DETECTED NOT DETECTED Final   Escherichia coli NOT DETECTED NOT DETECTED Final   Klebsiella oxytoca NOT DETECTED NOT DETECTED Final   Klebsiella pneumoniae NOT DETECTED NOT DETECTED Final   Proteus species NOT DETECTED NOT DETECTED Final   Serratia marcescens NOT DETECTED NOT DETECTED Final   Haemophilus influenzae NOT DETECTED NOT DETECTED Final   Neisseria meningitidis NOT DETECTED NOT DETECTED Final   Pseudomonas aeruginosa NOT DETECTED NOT DETECTED Final   Candida albicans NOT DETECTED NOT DETECTED Final   Candida glabrata NOT DETECTED NOT DETECTED Final   Candida krusei NOT DETECTED NOT DETECTED Final   Candida parapsilosis NOT DETECTED NOT DETECTED Final   Candida tropicalis NOT DETECTED NOT DETECTED Final    Comment: Performed at [facility], [place]. [address], [place], [place] [address]  SARS CORONAVIRUS 2 (TAT 6-24 HRS) Nasopharyngeal Nasopharyngeal Swab     Status: None   Collection Time: 01/10/20  9:41 PM   Specimen: Nasopharyngeal Swab  Result Value Ref Range Status   SARS Coronavirus 2 NEGATIVE NEGATIVE Final    Comment: (NOTE) SARS-CoV-2 target nucleic acids are NOT DETECTED. The SARS-CoV-2 RNA is generally detectable in upper and lower respiratory specimens during the acute phase of infection. Negative results do not preclude SARS-CoV-2 infection, do not rule out co-infections with other pathogens, and should not be used as the sole basis for  treatment or other patient management decisions. Negative results must be combined with clinical observations, patient history, and epidemiological information. The expected result is Negative. Fact Sheet for Patients: SugarRoll.be Fact Sheet for Healthcare Providers: https://www.woods-mathews.com/ This test is not yet approved or cleared by the [place] FDA and  has been authorized for detection and/or diagnosis of SARS-CoV-2 by FDA under an Emergency Use Authorization (EUA). This EUA will remain  in effect (meaning this test can be used) for the duration of the COVID-19 declaration under Section 56 4(b)(1) of the Act, 21 U.S.C. section 360bbb-3(b)(1), unless the authorization is terminated or revoked sooner. Performed at [facility], [place] [address] [place], [place] [address]   Culture, blood (routine x 2)     Status: None (Preliminary result)   Collection Time: 01/12/20  4:41 AM   Specimen: BLOOD RIGHT HAND  Result Value Ref Range Status   Specimen Description   Final    BLOOD RIGHT HAND Performed at [facility] [address], [place], [place] [address]  Special Requests   Final    BOTTLES DRAWN AEROBIC ONLY Blood Culture adequate volume Performed at [facility] [address], [place], [place] [address]    Culture   Final    NO GROWTH 2 DAYS Performed at [facility] [address] [place], [place] [address]    Report Status PENDING  Incomplete     Labs: BNP (last 3 results) No results for input(s): BNP in the last 8760 hours. Basic Metabolic Panel: Recent Labs  Lab 01/10/20 1723 01/11/20 0100 01/11/20 0622 01/12/20 0441 01/13/20 0529  NA 138  --  142 142 141  K 3.2*  --  3.3* 3.4* 3.6  CL 104  --  104 106 105  CO2 24  --  27 28 27   GLUCOSE 179*  --  113* 108* 116*  BUN 12  --  14 16 11   CREATININE 0.62 0.62 0.49 0.56 0.52  CALCIUM 9.1  --  8.2* 8.8* 8.5*    Liver Function Tests: Recent Labs  Lab 01/10/20 1723 01/11/20 0622 01/13/20 0529  AST 33 27 21  ALT 19 19 13   ALKPHOS 40 33* 36*  BILITOT 1.1 0.9 0.7  PROT 6.7 5.8* 5.7*  ALBUMIN 3.8 3.2* 2.9*   No results for input(s): LIPASE, AMYLASE in the last 168 hours. No results for input(s): AMMONIA in the last 168 hours. CBC: Recent Labs  Lab 01/10/20 1723 01/11/20 0100 01/11/20 0622 01/12/20 0441 01/13/20 0529  WBC 5.8 4.7 6.5 5.7 4.7  NEUTROABS 4.4  --  4.8  --   --   HGB 13.9 10.9* 12.3 11.5* 12.1  HCT 43.4 34.4* 38.7 36.8 36.8  MCV 100.5* 103.0* 102.9* 101.9* 100.5*  PLT 216 159 176 187 201   Cardiac Enzymes: No results for input(s): CKTOTAL, CKMB, CKMBINDEX, TROPONINI in the last 168 hours. BNP: Invalid input(s): POCBNP CBG: No results for input(s): GLUCAP in the last 168 hours. D-Dimer No results for input(s): DDIMER in the last 72 hours. Hgb A1c No results for input(s): HGBA1C in the last 72 hours. Lipid Profile No results for input(s): CHOL, HDL, LDLCALC, TRIG, CHOLHDL, LDLDIRECT in the last 72 hours. Thyroid function studies No results for input(s): TSH, T4TOTAL, T3FREE, THYROIDAB in the last 72 hours.  Invalid input(s): FREET3 Anemia work up No results for input(s): VITAMINB12, FOLATE, FERRITIN, TIBC, IRON, RETICCTPCT in the last 72 hours. Urinalysis    Component Value Date/Time   COLORURINE YELLOW 01/11/2020 [place] 01/11/2020 0644   LABSPEC 1.030 01/11/2020 0644   PHURINE 5.0 01/11/2020 0644   GLUCOSEU NEGATIVE 01/11/2020 0644   HGBUR NEGATIVE 01/11/2020 0644   BILIRUBINUR NEGATIVE 01/11/2020 0644   BILIRUBINUR neg 03/07/2015 1506   KETONESUR 5 (A) 01/11/2020 0644   PROTEINUR NEGATIVE 01/11/2020 0644   UROBILINOGEN 0.2 03/07/2015 1506   UROBILINOGEN 0.2 07/30/2014 1236   NITRITE NEGATIVE 01/11/2020 0644   LEUKOCYTESUR TRACE (A) 01/11/2020 0644   Sepsis Labs Invalid input(s): PROCALCITONIN,  WBC,   LACTICIDVEN Microbiology Recent Results (from the past 240 hour(s))  Blood Culture (routine x 2)     Status: Abnormal   Collection Time: 01/10/20  5:23 PM   Specimen: BLOOD LEFT ARM  Result Value Ref Range Status   Specimen Description   Final    BLOOD LEFT ARM Performed at [facility], [place] [address] [place], [place] [address]    Special Requests   Final    BOTTLES DRAWN AEROBIC AND ANAEROBIC Blood  Culture adequate volume Performed at [facility] [address], [place], [place] [address]    Culture  Setup Time   Final    GRAM POSITIVE COCCI ANAEROBIC BOTTLE ONLY CRITICAL RESULT CALLED TO, READ BACK BY AND VERIFIED WITH: A JACKSON [id number] AT 1024 AM BY CM    Culture (A)  Final    STAPHYLOCOCCUS AUREUS SUSCEPTIBILITIES PERFORMED ON PREVIOUS CULTURE WITHIN THE LAST 5 DAYS. Performed at [facility], [place] [address] [place], [place] [address]    Report Status 01/13/2020 FINAL  Final  Urine culture     Status: Abnormal   Collection Time: 01/10/20  5:23 PM   Specimen: In/Out Cath Urine  Result Value Ref Range Status   Specimen Description   Final    IN/OUT CATH URINE Performed at [facility] [address], [place], [place] [address]    Special Requests   Final    NONE Performed at [facility], [place] [address], [place], [place] [address]    Culture 10,000 COLONIES/mL STAPHYLOCOCCUS HAEMOLYTICUS (A)  Final   Report Status 01/13/2020 FINAL  Final   Organism ID, Bacteria STAPHYLOCOCCUS HAEMOLYTICUS (A)  Final      Susceptibility   Staphylococcus haemolyticus - MIC*    CIPROFLOXACIN >=8 RESISTANT Resistant     GENTAMICIN <=0.5 SENSITIVE Sensitive     NITROFURANTOIN 32 SENSITIVE Sensitive     OXACILLIN >=4 RESISTANT Resistant     TETRACYCLINE <=1 SENSITIVE Sensitive     VANCOMYCIN <=0.5 SENSITIVE Sensitive     TRIMETH/SULFA <=10 SENSITIVE Sensitive     CLINDAMYCIN RESISTANT Resistant     RIFAMPIN <=0.5  SENSITIVE Sensitive     Inducible Clindamycin POSITIVE Resistant     * 10,000 COLONIES/mL STAPHYLOCOCCUS HAEMOLYTICUS  Respiratory Panel by RT PCR (Flu A&B, Covid) - Nasopharyngeal Swab     Status: None   Collection Time: 01/10/20  5:24 PM   Specimen: Nasopharyngeal Swab  Result Value Ref Range Status   SARS Coronavirus 2 by RT PCR NEGATIVE NEGATIVE Final    Comment: (NOTE) SARS-CoV-2 target nucleic acids are NOT DETECTED. The SARS-CoV-2 RNA is generally detectable in upper respiratoy specimens during the acute phase of infection. The lowest concentration of SARS-CoV-2 viral copies this assay can detect is 131 copies/mL. A negative result does not preclude SARS-Cov-2 infection and should not be used as the sole basis for treatment or other patient management decisions. A negative result may occur with  improper specimen collection/handling, submission of specimen other than nasopharyngeal swab, presence of viral mutation(s) within the areas targeted by this assay, and inadequate number of viral copies (<131 copies/mL). A negative result must be combined with clinical observations, patient history, and epidemiological information. The expected result is Negative. Fact Sheet for Patients:  PinkCheek.be Fact Sheet for Healthcare Providers:  GravelBags.it This test is not yet ap proved or cleared by the [place] FDA and  has been authorized for detection and/or diagnosis of SARS-CoV-2 by FDA under an Emergency Use Authorization (EUA). This EUA will remain  in effect (meaning this test can be used) for the duration of the COVID-19 declaration under Section 564(b)(1) of the Act, 21 U.S.C. section 360bbb-3(b)(1), unless the authorization is terminated or revoked sooner.    Influenza A by PCR NEGATIVE NEGATIVE Final   Influenza B by PCR NEGATIVE NEGATIVE Final    Comment: (NOTE) The Xpert Xpress SARS-CoV-2/FLU/RSV assay is  intended as an aid in  the diagnosis of influenza  from Nasopharyngeal swab specimens and  should not be used as a sole basis for treatment. Nasal washings and  aspirates are unacceptable for Xpert Xpress SARS-CoV-2/FLU/RSV  testing. Fact Sheet for Patients: PinkCheek.be Fact Sheet for Healthcare Providers: GravelBags.it This test is not yet approved or cleared by the [place] FDA and  has been authorized for detection and/or diagnosis of SARS-CoV-2 by  FDA under an Emergency Use Authorization (EUA). This EUA will remain  in effect (meaning this test can be used) for the duration of the  Covid-19 declaration under Section 564(b)(1) of the Act, 21  U.S.C. section 360bbb-3(b)(1), unless the authorization is  terminated or revoked. Performed at [facility], [place] [address] [place], [place] [address]   Blood Culture (routine x 2)     Status: Abnormal   Collection Time: 01/10/20  5:28 PM   Specimen: BLOOD LEFT HAND  Result Value Ref Range Status   Specimen Description   Final    BLOOD LEFT HAND Performed at [facility] [address] [place], [place] [address]    Special Requests   Final    BOTTLES DRAWN AEROBIC AND ANAEROBIC Blood Culture adequate volume Performed at [facility] [address], [place], [place] [address]    Culture  Setup Time   Final    GRAM POSITIVE COCCI IN BOTH AEROBIC AND ANAEROBIC BOTTLES CRITICAL VALUE NOTED.  VALUE IS CONSISTENT WITH PREVIOUSLY REPORTED AND CALLED VALUE. Performed at [facility], [place] [address] [place], [place] [address]    Culture STAPHYLOCOCCUS AUREUS (A)  Final   Report Status 01/13/2020 FINAL  Final   Organism ID, Bacteria STAPHYLOCOCCUS AUREUS  Final      Susceptibility   Staphylococcus aureus - MIC*    CIPROFLOXACIN >=8 RESISTANT Resistant     ERYTHROMYCIN <=0.25 SENSITIVE Sensitive     GENTAMICIN  <=0.5 SENSITIVE Sensitive     OXACILLIN <=0.25 SENSITIVE Sensitive     TETRACYCLINE <=1 SENSITIVE Sensitive     VANCOMYCIN 1 SENSITIVE Sensitive     TRIMETH/SULFA <=10 SENSITIVE Sensitive     CLINDAMYCIN <=0.25 SENSITIVE Sensitive     RIFAMPIN <=0.5 SENSITIVE Sensitive     Inducible Clindamycin NEGATIVE Sensitive     * STAPHYLOCOCCUS AUREUS  Blood Culture ID Panel (Reflexed)     Status: Abnormal   Collection Time: 01/10/20  5:28 PM  Result Value Ref Range Status   Enterococcus species NOT DETECTED NOT DETECTED Final   Listeria monocytogenes NOT DETECTED NOT DETECTED Final   Staphylococcus species DETECTED (A) NOT DETECTED Final    Comment: CRITICAL RESULT CALLED TO, READ BACK BY AND VERIFIED WITH: Seleta Rhymes PharmD 12:30 01/11/20 ([email or web address])    Staphylococcus aureus (BCID) DETECTED (A) NOT DETECTED Final    Comment: Methicillin (oxacillin) susceptible Staphylococcus aureus (MSSA). Preferred therapy is anti staphylococcal beta lactam antibiotic (Cefazolin or Nafcillin), unless clinically contraindicated. CRITICAL RESULT CALLED TO, READ BACK BY AND VERIFIED WITH: Seleta Rhymes PharmD 12:30 01/11/20 ([email or web address])    Methicillin resistance NOT DETECTED NOT DETECTED Final   Streptococcus species NOT DETECTED NOT DETECTED Final   Streptococcus agalactiae NOT DETECTED NOT DETECTED Final   Streptococcus pneumoniae NOT DETECTED NOT DETECTED Final   Streptococcus pyogenes NOT DETECTED NOT DETECTED Final   Acinetobacter baumannii NOT DETECTED NOT DETECTED Final   Enterobacteriaceae species NOT DETECTED NOT DETECTED Final   Enterobacter cloacae complex NOT DETECTED NOT DETECTED Final   Escherichia coli NOT DETECTED NOT DETECTED Final  Klebsiella oxytoca NOT DETECTED NOT DETECTED Final   Klebsiella pneumoniae NOT DETECTED NOT DETECTED Final   Proteus species NOT DETECTED NOT DETECTED Final   Serratia marcescens NOT DETECTED NOT DETECTED Final   Haemophilus influenzae NOT DETECTED NOT DETECTED Final    Neisseria meningitidis NOT DETECTED NOT DETECTED Final   Pseudomonas aeruginosa NOT DETECTED NOT DETECTED Final   Candida albicans NOT DETECTED NOT DETECTED Final   Candida glabrata NOT DETECTED NOT DETECTED Final   Candida krusei NOT DETECTED NOT DETECTED Final   Candida parapsilosis NOT DETECTED NOT DETECTED Final   Candida tropicalis NOT DETECTED NOT DETECTED Final    Comment: Performed at [facility], [place] [address], [place], [place] [address]  SARS CORONAVIRUS 2 (TAT 6-24 HRS) Nasopharyngeal Nasopharyngeal Swab     Status: None   Collection Time: 01/10/20  9:41 PM   Specimen: Nasopharyngeal Swab  Result Value Ref Range Status   SARS Coronavirus 2 NEGATIVE NEGATIVE Final    Comment: (NOTE) SARS-CoV-2 target nucleic acids are NOT DETECTED. The SARS-CoV-2 RNA is generally detectable in upper and lower respiratory specimens during the acute phase of infection. Negative results do not preclude SARS-CoV-2 infection, do not rule out co-infections with other pathogens, and should not be used as the sole basis for treatment or other patient management decisions. Negative results must be combined with clinical observations, patient history, and epidemiological information. The expected result is Negative. Fact Sheet for Patients: SugarRoll.be Fact Sheet for Healthcare Providers: https://www.woods-mathews.com/ This test is not yet approved or cleared by the [place] FDA and  has been authorized for detection and/or diagnosis of SARS-CoV-2 by FDA under an Emergency Use Authorization (EUA). This EUA will remain  in effect (meaning this test can be used) for the duration of the COVID-19 declaration under Section 56 4(b)(1) of the Act, 21 U.S.C. section 360bbb-3(b)(1), unless the authorization is terminated or revoked sooner. Performed at [facility], [place] [address] [place], [place] [address]   Culture, blood (routine x 2)      Status: None (Preliminary result)   Collection Time: 01/12/20  4:41 AM   Specimen: BLOOD RIGHT HAND  Result Value Ref Range Status   Specimen Description   Final    BLOOD RIGHT HAND Performed at [facility] [address] [place], [place] [address]    Special Requests   Final    BOTTLES DRAWN AEROBIC ONLY Blood Culture adequate volume Performed at [facility] [address], [place], [place] [address]    Culture   Final    NO GROWTH 2 DAYS Performed at [facility] [address], [place], [place] [address]    Report Status PENDING  Incomplete   Time spent: 30 min  SIGNED:   Marylu Lund, MD  [facility] 01/14/2020, 1:45 PM  If 7PM-7AM, please contact night-coverage

## 2020-01-14 NOTE — TOC Transition Note (Signed)
Transition of Care [facility]) - CM/SW Discharge Note   Patient Details  Name: [name] MRN: [id number] Date of Birth: 1926-04-15  Transition of Care [facility] CM/SW Contact:  Dessa Phi, RN Phone Number: 01/14/2020, 12:32 PM   Clinical Narrative:  D/c home w/HHC/iv abx infusion. Dtr to come to hospital for initial teaching.transport home on own.No further CM needs.     Final next level of care: [place] Barriers to Discharge: No Barriers Identified   Patient Goals and CMS Choice        Discharge Placement                       Discharge Plan and Services   Discharge Planning Services: CM Consult                      HH Arranged: RN, IV Antibiotics, PT HH Agency: [facility], [facility] Date [place]: 01/14/20 Time [place]: 1232 Representative spoke with at [place]: [facility]  Social Determinants of Health (SDOH) Interventions     Readmission Risk Interventions No flowsheet data found.

## 2020-01-14 NOTE — Progress Notes (Signed)
PHARMACY CONSULT NOTE FOR:  OUTPATIENT  PARENTERAL ANTIBIOTIC THERAPY (OPAT)  Indication: MSSA bacteremia Regimen: cefazolin 2gm IV q8h until 01/24/2020 then cephalexin x additional 4 weeks.   End date: 01/24/2020  IV antibiotic discharge orders are pended. To discharging provider:  please sign these orders via discharge navigator,  Select New Orders & click on the button choice - Manage This Unsigned Work.     Thank you for allowing pharmacy to be a part of this patient's care.  Doreene Eland, PharmD, BCPS.   Work Cell: [phone] 01/14/2020 1:32 PM

## 2020-01-15 DIAGNOSIS — J189 Pneumonia, unspecified organism: Secondary | ICD-10-CM | POA: Diagnosis not present

## 2020-01-15 DIAGNOSIS — I10 Essential (primary) hypertension: Secondary | ICD-10-CM | POA: Diagnosis not present

## 2020-01-15 DIAGNOSIS — A4101 Sepsis due to Methicillin susceptible Staphylococcus aureus: Secondary | ICD-10-CM | POA: Diagnosis not present

## 2020-01-15 DIAGNOSIS — F0280 Dementia in other diseases classified elsewhere without behavioral disturbance: Secondary | ICD-10-CM | POA: Diagnosis not present

## 2020-01-15 DIAGNOSIS — I2510 Atherosclerotic heart disease of native coronary artery without angina pectoris: Secondary | ICD-10-CM | POA: Diagnosis not present

## 2020-01-15 DIAGNOSIS — Z452 Encounter for adjustment and management of vascular access device: Secondary | ICD-10-CM | POA: Diagnosis not present

## 2020-01-15 DIAGNOSIS — G309 Alzheimer's disease, unspecified: Secondary | ICD-10-CM | POA: Diagnosis not present

## 2020-01-17 LAB — CULTURE, BLOOD (ROUTINE X 2)
Culture: NO GROWTH
Special Requests: ADEQUATE

## 2020-01-19 DIAGNOSIS — J189 Pneumonia, unspecified organism: Secondary | ICD-10-CM | POA: Diagnosis not present

## 2020-01-19 DIAGNOSIS — A4101 Sepsis due to Methicillin susceptible Staphylococcus aureus: Secondary | ICD-10-CM | POA: Diagnosis not present

## 2020-01-20 DIAGNOSIS — I10 Essential (primary) hypertension: Secondary | ICD-10-CM | POA: Diagnosis not present

## 2020-01-20 DIAGNOSIS — R531 Weakness: Secondary | ICD-10-CM | POA: Diagnosis not present

## 2020-01-20 DIAGNOSIS — J9811 Atelectasis: Secondary | ICD-10-CM | POA: Diagnosis not present

## 2020-01-20 DIAGNOSIS — R6510 Systemic inflammatory response syndrome (SIRS) of non-infectious origin without acute organ dysfunction: Secondary | ICD-10-CM | POA: Diagnosis not present

## 2020-01-21 DIAGNOSIS — A4189 Other specified sepsis: Secondary | ICD-10-CM | POA: Diagnosis not present

## 2020-01-27 DIAGNOSIS — K591 Functional diarrhea: Secondary | ICD-10-CM | POA: Diagnosis not present

## 2020-01-27 DIAGNOSIS — K219 Gastro-esophageal reflux disease without esophagitis: Secondary | ICD-10-CM | POA: Diagnosis not present

## 2020-01-27 DIAGNOSIS — R1031 Right lower quadrant pain: Secondary | ICD-10-CM | POA: Diagnosis not present

## 2020-01-27 DIAGNOSIS — K649 Unspecified hemorrhoids: Secondary | ICD-10-CM | POA: Diagnosis not present

## 2020-01-27 DIAGNOSIS — Z03818 Encounter for observation for suspected exposure to other biological agents ruled out: Secondary | ICD-10-CM | POA: Diagnosis not present

## 2020-02-01 DIAGNOSIS — Z03818 Encounter for observation for suspected exposure to other biological agents ruled out: Secondary | ICD-10-CM | POA: Diagnosis not present

## 2020-02-01 DIAGNOSIS — J189 Pneumonia, unspecified organism: Secondary | ICD-10-CM | POA: Diagnosis not present

## 2020-02-01 DIAGNOSIS — F0280 Dementia in other diseases classified elsewhere without behavioral disturbance: Secondary | ICD-10-CM | POA: Diagnosis not present

## 2020-02-01 DIAGNOSIS — Z452 Encounter for adjustment and management of vascular access device: Secondary | ICD-10-CM | POA: Diagnosis not present

## 2020-02-01 DIAGNOSIS — A4101 Sepsis due to Methicillin susceptible Staphylococcus aureus: Secondary | ICD-10-CM | POA: Diagnosis not present

## 2020-02-01 DIAGNOSIS — I10 Essential (primary) hypertension: Secondary | ICD-10-CM | POA: Diagnosis not present

## 2020-02-01 DIAGNOSIS — G309 Alzheimer's disease, unspecified: Secondary | ICD-10-CM | POA: Diagnosis not present

## 2020-02-01 DIAGNOSIS — I2510 Atherosclerotic heart disease of native coronary artery without angina pectoris: Secondary | ICD-10-CM | POA: Diagnosis not present

## 2020-02-12 ENCOUNTER — Other Ambulatory Visit: Payer: Self-pay | Admitting: Neurology

## 2020-02-15 DIAGNOSIS — Z03818 Encounter for observation for suspected exposure to other biological agents ruled out: Secondary | ICD-10-CM | POA: Diagnosis not present

## 2020-02-22 DIAGNOSIS — Z03818 Encounter for observation for suspected exposure to other biological agents ruled out: Secondary | ICD-10-CM | POA: Diagnosis not present

## 2020-02-29 DIAGNOSIS — K591 Functional diarrhea: Secondary | ICD-10-CM | POA: Diagnosis not present

## 2020-02-29 DIAGNOSIS — K649 Unspecified hemorrhoids: Secondary | ICD-10-CM | POA: Diagnosis not present

## 2020-02-29 DIAGNOSIS — K219 Gastro-esophageal reflux disease without esophagitis: Secondary | ICD-10-CM | POA: Diagnosis not present

## 2020-02-29 DIAGNOSIS — R1031 Right lower quadrant pain: Secondary | ICD-10-CM | POA: Diagnosis not present

## 2020-03-16 DIAGNOSIS — Z03818 Encounter for observation for suspected exposure to other biological agents ruled out: Secondary | ICD-10-CM | POA: Diagnosis not present

## 2020-03-24 DIAGNOSIS — Z03818 Encounter for observation for suspected exposure to other biological agents ruled out: Secondary | ICD-10-CM | POA: Diagnosis not present

## 2020-03-30 DIAGNOSIS — Z03818 Encounter for observation for suspected exposure to other biological agents ruled out: Secondary | ICD-10-CM | POA: Diagnosis not present

## 2020-04-06 DIAGNOSIS — Z1159 Encounter for screening for other viral diseases: Secondary | ICD-10-CM | POA: Diagnosis not present

## 2020-04-06 DIAGNOSIS — Z20828 Contact with and (suspected) exposure to other viral communicable diseases: Secondary | ICD-10-CM | POA: Diagnosis not present

## 2020-04-13 DIAGNOSIS — Z1159 Encounter for screening for other viral diseases: Secondary | ICD-10-CM | POA: Diagnosis not present

## 2020-04-13 DIAGNOSIS — Z20828 Contact with and (suspected) exposure to other viral communicable diseases: Secondary | ICD-10-CM | POA: Diagnosis not present

## 2020-04-18 ENCOUNTER — Ambulatory Visit: Payer: PPO | Admitting: Adult Health

## 2020-04-20 DIAGNOSIS — Z20828 Contact with and (suspected) exposure to other viral communicable diseases: Secondary | ICD-10-CM | POA: Diagnosis not present

## 2020-04-20 DIAGNOSIS — Z1159 Encounter for screening for other viral diseases: Secondary | ICD-10-CM | POA: Diagnosis not present

## 2020-04-27 DIAGNOSIS — Z20828 Contact with and (suspected) exposure to other viral communicable diseases: Secondary | ICD-10-CM | POA: Diagnosis not present

## 2020-04-27 DIAGNOSIS — Z1159 Encounter for screening for other viral diseases: Secondary | ICD-10-CM | POA: Diagnosis not present

## 2020-05-11 DIAGNOSIS — Z1159 Encounter for screening for other viral diseases: Secondary | ICD-10-CM | POA: Diagnosis not present

## 2020-05-11 DIAGNOSIS — Z20828 Contact with and (suspected) exposure to other viral communicable diseases: Secondary | ICD-10-CM | POA: Diagnosis not present

## 2020-05-18 DIAGNOSIS — Z20828 Contact with and (suspected) exposure to other viral communicable diseases: Secondary | ICD-10-CM | POA: Diagnosis not present

## 2020-05-18 DIAGNOSIS — Z1159 Encounter for screening for other viral diseases: Secondary | ICD-10-CM | POA: Diagnosis not present

## 2020-05-25 DIAGNOSIS — Z1159 Encounter for screening for other viral diseases: Secondary | ICD-10-CM | POA: Diagnosis not present

## 2020-05-25 DIAGNOSIS — Z20828 Contact with and (suspected) exposure to other viral communicable diseases: Secondary | ICD-10-CM | POA: Diagnosis not present

## 2020-05-31 DIAGNOSIS — N3001 Acute cystitis with hematuria: Secondary | ICD-10-CM | POA: Diagnosis not present

## 2020-05-31 DIAGNOSIS — R822 Biliuria: Secondary | ICD-10-CM | POA: Diagnosis not present

## 2020-05-31 DIAGNOSIS — R82998 Other abnormal findings in urine: Secondary | ICD-10-CM | POA: Diagnosis not present

## 2020-06-01 ENCOUNTER — Inpatient Hospital Stay (HOSPITAL_COMMUNITY): Payer: PPO

## 2020-06-01 ENCOUNTER — Other Ambulatory Visit: Payer: Self-pay

## 2020-06-01 ENCOUNTER — Inpatient Hospital Stay (HOSPITAL_COMMUNITY)
Admission: EM | Admit: 2020-06-01 | Discharge: 2020-06-07 | DRG: 871 | Disposition: A | Discharge status: Nursing Home (Includes ICF) | Payer: PPO | Source: Skilled Nursing Facility | Attending: Internal Medicine | Admitting: Internal Medicine

## 2020-06-01 ENCOUNTER — Emergency Department (HOSPITAL_COMMUNITY): Payer: PPO

## 2020-06-01 ENCOUNTER — Encounter (HOSPITAL_COMMUNITY): Payer: Self-pay

## 2020-06-01 DIAGNOSIS — R410 Disorientation, unspecified: Secondary | ICD-10-CM | POA: Diagnosis present

## 2020-06-01 DIAGNOSIS — R17 Unspecified jaundice: Secondary | ICD-10-CM

## 2020-06-01 DIAGNOSIS — Z8249 Family history of ischemic heart disease and other diseases of the circulatory system: Secondary | ICD-10-CM

## 2020-06-01 DIAGNOSIS — Z7902 Long term (current) use of antithrombotics/antiplatelets: Secondary | ICD-10-CM

## 2020-06-01 DIAGNOSIS — N3000 Acute cystitis without hematuria: Secondary | ICD-10-CM | POA: Diagnosis present

## 2020-06-01 DIAGNOSIS — N179 Acute kidney failure, unspecified: Secondary | ICD-10-CM | POA: Diagnosis not present

## 2020-06-01 DIAGNOSIS — Z87891 Personal history of nicotine dependence: Secondary | ICD-10-CM

## 2020-06-01 DIAGNOSIS — I2510 Atherosclerotic heart disease of native coronary artery without angina pectoris: Secondary | ICD-10-CM | POA: Diagnosis not present

## 2020-06-01 DIAGNOSIS — N390 Urinary tract infection, site not specified: Secondary | ICD-10-CM | POA: Diagnosis not present

## 2020-06-01 DIAGNOSIS — Z882 Allergy status to sulfonamides status: Secondary | ICD-10-CM

## 2020-06-01 DIAGNOSIS — R159 Full incontinence of feces: Secondary | ICD-10-CM | POA: Diagnosis not present

## 2020-06-01 DIAGNOSIS — J9601 Acute respiratory failure with hypoxia: Secondary | ICD-10-CM | POA: Diagnosis not present

## 2020-06-01 DIAGNOSIS — Z20822 Contact with and (suspected) exposure to covid-19: Secondary | ICD-10-CM | POA: Diagnosis present

## 2020-06-01 DIAGNOSIS — R7989 Other specified abnormal findings of blood chemistry: Secondary | ICD-10-CM | POA: Diagnosis not present

## 2020-06-01 DIAGNOSIS — K449 Diaphragmatic hernia without obstruction or gangrene: Secondary | ICD-10-CM | POA: Diagnosis present

## 2020-06-01 DIAGNOSIS — R748 Abnormal levels of other serum enzymes: Secondary | ICD-10-CM

## 2020-06-01 DIAGNOSIS — R945 Abnormal results of liver function studies: Secondary | ICD-10-CM | POA: Diagnosis not present

## 2020-06-01 DIAGNOSIS — R7982 Elevated C-reactive protein (CRP): Secondary | ICD-10-CM | POA: Diagnosis not present

## 2020-06-01 DIAGNOSIS — Z880 Allergy status to penicillin: Secondary | ICD-10-CM

## 2020-06-01 DIAGNOSIS — R6520 Severe sepsis without septic shock: Secondary | ICD-10-CM | POA: Diagnosis not present

## 2020-06-01 DIAGNOSIS — Z66 Do not resuscitate: Secondary | ICD-10-CM | POA: Diagnosis present

## 2020-06-01 DIAGNOSIS — C787 Secondary malignant neoplasm of liver and intrahepatic bile duct: Secondary | ICD-10-CM | POA: Diagnosis present

## 2020-06-01 DIAGNOSIS — K264 Chronic or unspecified duodenal ulcer with hemorrhage: Secondary | ICD-10-CM | POA: Diagnosis not present

## 2020-06-01 DIAGNOSIS — G4700 Insomnia, unspecified: Secondary | ICD-10-CM

## 2020-06-01 DIAGNOSIS — A09 Infectious gastroenteritis and colitis, unspecified: Secondary | ICD-10-CM | POA: Diagnosis present

## 2020-06-01 DIAGNOSIS — K219 Gastro-esophageal reflux disease without esophagitis: Secondary | ICD-10-CM | POA: Diagnosis not present

## 2020-06-01 DIAGNOSIS — M810 Age-related osteoporosis without current pathological fracture: Secondary | ICD-10-CM | POA: Diagnosis not present

## 2020-06-01 DIAGNOSIS — K838 Other specified diseases of biliary tract: Secondary | ICD-10-CM | POA: Diagnosis not present

## 2020-06-01 DIAGNOSIS — E876 Hypokalemia: Secondary | ICD-10-CM | POA: Diagnosis not present

## 2020-06-01 DIAGNOSIS — I712 Thoracic aortic aneurysm, without rupture: Secondary | ICD-10-CM | POA: Diagnosis not present

## 2020-06-01 DIAGNOSIS — C250 Malignant neoplasm of head of pancreas: Secondary | ICD-10-CM | POA: Diagnosis present

## 2020-06-01 DIAGNOSIS — K8681 Exocrine pancreatic insufficiency: Secondary | ICD-10-CM | POA: Diagnosis present

## 2020-06-01 DIAGNOSIS — G9341 Metabolic encephalopathy: Secondary | ICD-10-CM | POA: Diagnosis not present

## 2020-06-01 DIAGNOSIS — G309 Alzheimer's disease, unspecified: Secondary | ICD-10-CM | POA: Diagnosis present

## 2020-06-01 DIAGNOSIS — R5381 Other malaise: Secondary | ICD-10-CM | POA: Diagnosis not present

## 2020-06-01 DIAGNOSIS — R0902 Hypoxemia: Secondary | ICD-10-CM | POA: Diagnosis not present

## 2020-06-01 DIAGNOSIS — Z881 Allergy status to other antibiotic agents status: Secondary | ICD-10-CM

## 2020-06-01 DIAGNOSIS — Z79891 Long term (current) use of opiate analgesic: Secondary | ICD-10-CM

## 2020-06-01 DIAGNOSIS — D72829 Elevated white blood cell count, unspecified: Secondary | ICD-10-CM | POA: Diagnosis not present

## 2020-06-01 DIAGNOSIS — K8689 Other specified diseases of pancreas: Secondary | ICD-10-CM | POA: Diagnosis not present

## 2020-06-01 DIAGNOSIS — R0602 Shortness of breath: Secondary | ICD-10-CM | POA: Diagnosis not present

## 2020-06-01 DIAGNOSIS — F0280 Dementia in other diseases classified elsewhere without behavioral disturbance: Secondary | ICD-10-CM | POA: Diagnosis present

## 2020-06-01 DIAGNOSIS — Z515 Encounter for palliative care: Secondary | ICD-10-CM | POA: Diagnosis not present

## 2020-06-01 DIAGNOSIS — R935 Abnormal findings on diagnostic imaging of other abdominal regions, including retroperitoneum: Secondary | ICD-10-CM | POA: Diagnosis not present

## 2020-06-01 DIAGNOSIS — R7401 Elevation of levels of liver transaminase levels: Secondary | ICD-10-CM | POA: Diagnosis not present

## 2020-06-01 DIAGNOSIS — R109 Unspecified abdominal pain: Secondary | ICD-10-CM

## 2020-06-01 DIAGNOSIS — R932 Abnormal findings on diagnostic imaging of liver and biliary tract: Secondary | ICD-10-CM | POA: Diagnosis not present

## 2020-06-01 DIAGNOSIS — I10 Essential (primary) hypertension: Secondary | ICD-10-CM | POA: Diagnosis present

## 2020-06-01 DIAGNOSIS — R404 Transient alteration of awareness: Secondary | ICD-10-CM | POA: Diagnosis not present

## 2020-06-01 DIAGNOSIS — R0600 Dyspnea, unspecified: Secondary | ICD-10-CM

## 2020-06-01 DIAGNOSIS — K7689 Other specified diseases of liver: Secondary | ICD-10-CM | POA: Diagnosis not present

## 2020-06-01 DIAGNOSIS — A419 Sepsis, unspecified organism: Principal | ICD-10-CM | POA: Diagnosis present

## 2020-06-01 DIAGNOSIS — Z90710 Acquired absence of both cervix and uterus: Secondary | ICD-10-CM

## 2020-06-01 DIAGNOSIS — I959 Hypotension, unspecified: Secondary | ICD-10-CM | POA: Diagnosis not present

## 2020-06-01 DIAGNOSIS — K269 Duodenal ulcer, unspecified as acute or chronic, without hemorrhage or perforation: Secondary | ICD-10-CM | POA: Diagnosis not present

## 2020-06-01 DIAGNOSIS — Z79899 Other long term (current) drug therapy: Secondary | ICD-10-CM

## 2020-06-01 DIAGNOSIS — J9811 Atelectasis: Secondary | ICD-10-CM | POA: Diagnosis not present

## 2020-06-01 DIAGNOSIS — Z7189 Other specified counseling: Secondary | ICD-10-CM | POA: Diagnosis not present

## 2020-06-01 DIAGNOSIS — K831 Obstruction of bile duct: Secondary | ICD-10-CM | POA: Diagnosis not present

## 2020-06-01 DIAGNOSIS — Z8673 Personal history of transient ischemic attack (TIA), and cerebral infarction without residual deficits: Secondary | ICD-10-CM

## 2020-06-01 DIAGNOSIS — Z789 Other specified health status: Secondary | ICD-10-CM

## 2020-06-01 DIAGNOSIS — Z888 Allergy status to other drugs, medicaments and biological substances status: Secondary | ICD-10-CM

## 2020-06-01 DIAGNOSIS — K529 Noninfective gastroenteritis and colitis, unspecified: Secondary | ICD-10-CM | POA: Diagnosis not present

## 2020-06-01 DIAGNOSIS — Z886 Allergy status to analgesic agent status: Secondary | ICD-10-CM

## 2020-06-01 LAB — URINALYSIS, ROUTINE W REFLEX MICROSCOPIC
Bilirubin Urine: NEGATIVE
Glucose, UA: NEGATIVE mg/dL
Ketones, ur: NEGATIVE mg/dL
Nitrite: POSITIVE — AB
Protein, ur: 30 mg/dL — AB
Specific Gravity, Urine: 1.04 — ABNORMAL HIGH (ref 1.005–1.030)
WBC, UA: >50 WBC/hpf — ABNORMAL HIGH (ref 0–5)
pH: 6 (ref 5.0–8.0)

## 2020-06-01 LAB — CBC WITH DIFFERENTIAL/PLATELET
Abs Immature Granulocytes: 1.35 10*3/uL — ABNORMAL HIGH (ref 0.00–0.07)
Basophils Absolute: 0.1 10*3/uL (ref 0.0–0.1)
Basophils Relative: 0 %
Eosinophils Absolute: 0.3 10*3/uL (ref 0.0–0.5)
Eosinophils Relative: 1 %
HCT: 37.8 % (ref 36.0–46.0)
Hemoglobin: 13 g/dL (ref 12.0–15.0)
Immature Granulocytes: 5 %
Lymphocytes Relative: 3 %
Lymphs Abs: 0.7 10*3/uL (ref 0.7–4.0)
MCH: 31.6 pg (ref 26.0–34.0)
MCHC: 34.4 g/dL (ref 30.0–36.0)
MCV: 92 fL (ref 80.0–100.0)
Monocytes Absolute: 1.6 10*3/uL — ABNORMAL HIGH (ref 0.1–1.0)
Monocytes Relative: 6 %
Neutro Abs: 21.3 10*3/uL — ABNORMAL HIGH (ref 1.7–7.7)
Neutrophils Relative %: 85 %
Platelets: 396 10*3/uL (ref 150–400)
RBC: 4.11 MIL/uL (ref 3.87–5.11)
RDW: 17.4 % — ABNORMAL HIGH (ref 11.5–15.5)
WBC: 25.4 10*3/uL — ABNORMAL HIGH (ref 4.0–10.5)
nRBC: 0 % (ref 0.0–0.2)

## 2020-06-01 LAB — CBC
HCT: 32.4 % — ABNORMAL LOW (ref 36.0–46.0)
Hemoglobin: 10.9 g/dL — ABNORMAL LOW (ref 12.0–15.0)
MCH: 31 pg (ref 26.0–34.0)
MCHC: 33.6 g/dL (ref 30.0–36.0)
MCV: 92 fL (ref 80.0–100.0)
Platelets: 353 10*3/uL (ref 150–400)
RBC: 3.52 MIL/uL — ABNORMAL LOW (ref 3.87–5.11)
RDW: 17.6 % — ABNORMAL HIGH (ref 11.5–15.5)
WBC: 16.8 10*3/uL — ABNORMAL HIGH (ref 4.0–10.5)
nRBC: 0 % (ref 0.0–0.2)

## 2020-06-01 LAB — BLOOD GAS, ARTERIAL
Acid-base deficit: 3.2 mmol/L — ABNORMAL HIGH (ref 0.0–2.0)
Bicarbonate: 20.1 mmol/L (ref 20.0–28.0)
Drawn by: 295031
O2 Content: 2 L/min
O2 Saturation: 92.9 %
Patient temperature: 98.6
pCO2 arterial: 32.2 mmHg (ref 32.0–48.0)
pH, Arterial: 7.413 (ref 7.350–7.450)
pO2, Arterial: 67.4 mmHg — ABNORMAL LOW (ref 83.0–108.0)

## 2020-06-01 LAB — AMMONIA: Ammonia: 23 umol/L (ref 9–35)

## 2020-06-01 LAB — CREATININE, SERUM
Creatinine, Ser: 0.73 mg/dL (ref 0.44–1.00)
GFR calc Af Amer: >60 mL/min (ref >60)
GFR calc non Af Amer: >60 mL/min (ref >60)

## 2020-06-01 LAB — LACTIC ACID, PLASMA
Lactic Acid, Venous: 2.1 mmol/L (ref 0.5–1.9)
Lactic Acid, Venous: 1.8 mmol/L (ref 0.5–1.9)

## 2020-06-01 LAB — BASIC METABOLIC PANEL
Anion gap: 15 (ref 5–15)
BUN: 17 mg/dL (ref 8–23)
CO2: 26 mmol/L (ref 22–32)
Calcium: 9.3 mg/dL (ref 8.9–10.3)
Chloride: 96 mmol/L — ABNORMAL LOW (ref 98–111)
Creatinine, Ser: 0.9 mg/dL (ref 0.44–1.00)
GFR calc Af Amer: >60 mL/min (ref >60)
GFR calc non Af Amer: 56 mL/min — ABNORMAL LOW (ref >60)
Glucose, Bld: 134 mg/dL — ABNORMAL HIGH (ref 70–99)
Potassium: 3.1 mmol/L — ABNORMAL LOW (ref 3.5–5.1)
Sodium: 137 mmol/L (ref 135–145)

## 2020-06-01 LAB — HEPATITIS PANEL, ACUTE
HCV Ab: NONREACTIVE
Hep A IgM: NONREACTIVE
Hep B C IgM: NONREACTIVE
Hepatitis B Surface Ag: NONREACTIVE

## 2020-06-01 LAB — SARS CORONAVIRUS 2 BY RT PCR (HOSPITAL ORDER, PERFORMED IN ~~LOC~~ HOSPITAL LAB): SARS Coronavirus 2: NEGATIVE

## 2020-06-01 LAB — HEPATIC FUNCTION PANEL
ALT: 100 U/L — ABNORMAL HIGH (ref 0–44)
AST: 149 U/L — ABNORMAL HIGH (ref 15–41)
Albumin: 2.9 g/dL — ABNORMAL LOW (ref 3.5–5.0)
Alkaline Phosphatase: 280 U/L — ABNORMAL HIGH (ref 38–126)
Bilirubin, Direct: 6.1 mg/dL — ABNORMAL HIGH (ref 0.0–0.2)
Indirect Bilirubin: 3.2 mg/dL — ABNORMAL HIGH (ref 0.3–0.9)
Total Bilirubin: 9.3 mg/dL — ABNORMAL HIGH (ref 0.3–1.2)
Total Protein: 6.3 g/dL — ABNORMAL LOW (ref 6.5–8.1)

## 2020-06-01 LAB — PROTIME-INR
INR: 1 (ref 0.8–1.2)
Prothrombin Time: 13.2 seconds (ref 11.4–15.2)

## 2020-06-01 LAB — APTT: aPTT: 29 seconds (ref 24–36)

## 2020-06-01 LAB — ACETAMINOPHEN LEVEL: Acetaminophen (Tylenol), Serum: <10 ug/mL — ABNORMAL LOW (ref 10–30)

## 2020-06-01 LAB — LIPASE, BLOOD: Lipase: 108 U/L — ABNORMAL HIGH (ref 11–51)

## 2020-06-01 LAB — BRAIN NATRIURETIC PEPTIDE: B Natriuretic Peptide: 684.2 pg/mL — ABNORMAL HIGH (ref 0.0–100.0)

## 2020-06-01 MED ORDER — METRONIDAZOLE IN NACL 5-0.79 MG/ML-% IV SOLN
500.0000 mg | Freq: Three times a day (TID) | INTRAVENOUS | Status: DC
  Administered 2020-06-01 – 2020-06-07 (×17): 500 mg via INTRAVENOUS
  Filled 2020-06-01 (×17): qty 100

## 2020-06-01 MED ORDER — ONDANSETRON HCL 4 MG/2ML IJ SOLN
4.0000 mg | Freq: Four times a day (QID) | INTRAMUSCULAR | Status: DC | PRN
  Administered 2020-06-06: 4 mg via INTRAVENOUS

## 2020-06-01 MED ORDER — POTASSIUM CHLORIDE 10 MEQ/100ML IV SOLN
10.0000 meq | INTRAVENOUS | Status: AC
  Administered 2020-06-01 (×2): 10 meq via INTRAVENOUS
  Filled 2020-06-01 (×2): qty 100

## 2020-06-01 MED ORDER — ENOXAPARIN SODIUM 30 MG/0.3ML ~~LOC~~ SOLN: 30.0000 mg | SUBCUTANEOUS | Status: DC

## 2020-06-01 MED ORDER — SODIUM CHLORIDE 0.9 % IV SOLN
2.0000 g | Freq: Once | INTRAVENOUS | Status: AC
  Administered 2020-06-01: 2 g via INTRAVENOUS
  Filled 2020-06-01: qty 2

## 2020-06-01 MED ORDER — ENOXAPARIN SODIUM 40 MG/0.4ML ~~LOC~~ SOLN
40.0000 mg | SUBCUTANEOUS | Status: DC
  Administered 2020-06-01 – 2020-06-06 (×6): 40 mg via SUBCUTANEOUS
  Filled 2020-06-01 (×5): qty 0.4

## 2020-06-01 MED ORDER — LORAZEPAM 2 MG/ML IJ SOLN
0.5000 mg | Freq: Once | INTRAMUSCULAR | Status: AC
  Administered 2020-06-01: 0.5 mg via INTRAVENOUS
  Filled 2020-06-01: qty 1

## 2020-06-01 MED ORDER — VANCOMYCIN HCL IN DEXTROSE 1-5 GM/200ML-% IV SOLN: 1000.0000 mg | Freq: Once | INTRAVENOUS | Status: DC

## 2020-06-01 MED ORDER — SENNOSIDES-DOCUSATE SODIUM 8.6-50 MG PO TABS: 1.0000 | ORAL_TABLET | Freq: Every evening | ORAL | Status: DC | PRN

## 2020-06-01 MED ORDER — ONDANSETRON HCL 4 MG PO TABS: 4.0000 mg | ORAL_TABLET | Freq: Four times a day (QID) | ORAL | Status: DC | PRN

## 2020-06-01 MED ORDER — SODIUM CHLORIDE 0.9 % IV SOLN
1000.0000 mL | INTRAVENOUS | Status: DC
  Administered 2020-06-01: 1000 mL via INTRAVENOUS

## 2020-06-01 MED ORDER — SODIUM CHLORIDE (PF) 0.9 % IJ SOLN
INTRAMUSCULAR | Status: AC
  Filled 2020-06-01: qty 50

## 2020-06-01 MED ORDER — IOHEXOL 350 MG/ML SOLN
100.0000 mL | Freq: Once | INTRAVENOUS | Status: AC | PRN
  Administered 2020-06-01: 100 mL via INTRAVENOUS

## 2020-06-01 MED ORDER — SODIUM CHLORIDE 0.9 % IV SOLN
2.0000 g | Freq: Two times a day (BID) | INTRAVENOUS | Status: DC
  Administered 2020-06-01 – 2020-06-06 (×11): 2 g via INTRAVENOUS
  Filled 2020-06-01 (×14): qty 2

## 2020-06-01 MED ORDER — METRONIDAZOLE IN NACL 5-0.79 MG/ML-% IV SOLN
500.0000 mg | Freq: Once | INTRAVENOUS | Status: AC
  Administered 2020-06-01: 500 mg via INTRAVENOUS
  Filled 2020-06-01: qty 100

## 2020-06-01 MED ORDER — VANCOMYCIN HCL 750 MG/150ML IV SOLN
750.0000 mg | INTRAVENOUS | Status: DC
  Filled 2020-06-01: qty 150

## 2020-06-01 MED ORDER — POTASSIUM CHLORIDE 2 MEQ/ML IV SOLN
Freq: Once | INTRAVENOUS | Status: AC
  Filled 2020-06-01: qty 1000

## 2020-06-01 MED ORDER — VANCOMYCIN HCL 1250 MG/250ML IV SOLN
1250.0000 mg | Freq: Once | INTRAVENOUS | Status: AC
  Administered 2020-06-01: 1250 mg via INTRAVENOUS
  Filled 2020-06-01: qty 250

## 2020-06-01 MED ORDER — GADOBUTROL 1 MMOL/ML IV SOLN
7.0000 mL | Freq: Once | INTRAVENOUS | Status: AC | PRN
  Administered 2020-06-01: 7 mL via INTRAVENOUS

## 2020-06-01 NOTE — ED Notes (Signed)
Patient back from MRCP.

## 2020-06-01 NOTE — Progress Notes (Signed)
Pharmacy Antibiotic Note  [name] is a 86 y.o. female admitted on 06/01/2020 with malaise/fatigue.  Pharmacy has been consulted for vancomycin + cefepime dosing.  Assessment: Pt is a 80 yoF with PMH significant for dementia, history of MSSA bacteremia in Jan 2021 treated with cefazolin. Antibiotics being initiated for sepsis - potential sources: cystitis, cholangitis, colitis.  Today, 06/01/20 -WBC 16.8 -SCr 0.73, CrCl 36 mL/min -Lactate 2.1 --> 1.8  Plan:  Cefepime 2 g IV q12h  Vancomycin 1250 mg LD followed by 750 mg IV q24h  Goal VT 15-20 mcg/mL  Follow renal function, culture data.   Height: 4\' 11"  (149.9 cm) Weight: 59 kg (130 lb) IBW/kg (Calculated) : 43.2  Temp (24hrs), Avg:98.6 F (37 C), Min:97.5 F (36.4 C), Max:100 F (37.8 C)  Recent Labs  Lab 06/01/20 0750 06/01/20 0957  WBC 25.4*  --   CREATININE 0.90  --   LATICACIDVEN 2.1* 1.8    Estimated Creatinine Clearance: 31.8 mL/min (by C-G formula based on SCr of 0.9 mg/dL).    Allergies  Allergen Reactions  . Sulfa Antibiotics Swelling and Other (See Comments)    Throat swelling  . Aspirin Other (See Comments)    Feels like choking  . Crestor [Rosuvastatin] Other (See Comments)    Lower Extremity Myalgia   . Bactrim Other (See Comments)    Unknown  . Demeclocycline Rash  . Niacin And Related Other (See Comments)    Turns red in the face  . Penicillins Other (See Comments)    Unknown Reaction Per The Patient Unknown  . Relafen [Nabumetone] Other (See Comments)    Unknown Reaction Per The Patient Unknown  . Tetracyclines & Related Rash    Antimicrobials this admission: cefepime 6/2 >>  metronidazole 6/2 >>  Vancomycin 6/2 >>  Dose adjustments this admission:  Microbiology results: 6/2 BCx: Sent 6/2 Urine: Sent  Thank you for allowing pharmacy to be a part of this patient's care.  Lenis Noon, PharmD 06/01/2020 12:15 PM

## 2020-06-01 NOTE — Progress Notes (Signed)
A consult was received from an ED physician for vancomycin & cefepime per pharmacy dosing.  The patient's profile has been reviewed for ht/wt/allergies/indication/available labs.   A one time order has been placed for vancomycin 1250 mg IV x 1 & cefepime 2 gm IV x 1. Note pt with acute liver disease & jaundice    Further antibiotics/pharmacy consults should be ordered by admitting physician if indicated.                       Thank you, Eudelia Bunch, Pharm.D 06/01/2020 8:24 AM

## 2020-06-01 NOTE — ED Provider Notes (Signed)
[place] DEPT Provider Note   CSN: [id number] Arrival date & time: 06/01/20  4680     History No chief complaint on file.   [name] is a 86 y.o. female who presents with cc of lethargy, weakness, decreased appetite and inability to ambulate. Level 5 caveat due to dementia. She was dx with uti at her ALF 2 days ago on antibiotics (Cipro per EMR).  She has been declining at the SNF and was sent in for evaluation. She has no other complaints at this time.   HPI     Past Medical History:  Diagnosis Date  . Aortic aneurysm, thoracic (HCC)    4.4 cm  . Aortic dilatation ([place])    Seen on cath 2005  . Arthritis   . CAD (coronary artery disease)    a. Nonobstructive by last cath in 05/2004 with 30% LAD. b. EF 65-70% 12/2010. c. Hx ASA intolerance.   . Colitis    12/2010 associated with transient lower GI bleeding/anemia tx with abx  . GERD (gastroesophageal reflux disease)    a. Minimal hiatal hernia on EGD 2006, no obvious esophageal source for CP at that time.  . Hypercholesteremia    Hx of myalgias felt to be statin related but taking Crestor as OP  . Hypertension   . Osteoporosis   . Stroke [facility])    TIA    Patient Active Problem List   Diagnosis Date Noted  . CAP (community acquired pneumonia) 01/11/2020  . Sepsis ([place]) 01/10/2020  . Alzheimer's disease ([place]) 07/02/2019  . Acute lower UTI 02/10/2019  . Acute metabolic encephalopathy 32/11/2481  . Encephalopathy acute 02/10/2019  . Chest pain, rule out acute myocardial infarction 09/03/2017  . Heart palpitations 07/31/2016  . Neck pain, musculoskeletal 04/26/2015  . Variants of migraine, not elsewhere classified, without mention of intractable migraine without mention of status migrainosus 08/17/2014  . Tension headache 08/17/2014  . TIA (transient ischemic attack) 07/30/2014  . HTN (hypertension) 07/30/2014  . Aortic aneurysm, thoracic ([place]) 06/25/2014  . Chest pain  07/29/2012  . CAD (coronary artery disease) 06/03/2012  . Hypercholesteremia   . GERD (gastroesophageal reflux disease)     Past Surgical History:  Procedure Laterality Date  . ABDOMINAL HYSTERECTOMY    . BACK SURGERY     Disc disease  . hysterectomy -- unknown type    . SHOULDER SURGERY    . Shunt for syringomyelia 1987    . TEMPORAL ARTERY BIOPSY / LIGATION     Negative 2005     OB History   No obstetric history on file.     Family History  Problem Relation Age of Onset  . Heart disease Mother   . Heart failure Mother   . Arthritis Mother   . Heart disease Brother   . Heart disease Other   . Heart failure Other     Social History   Tobacco Use  . Smoking status: Former Smoker    Quit date: 04/26/1959    Years since quitting: 61.1  . Smokeless tobacco: Never Used  Substance Use Topics  . Alcohol use: No    Alcohol/week: 1.0 standard drinks    Types: 1 Glasses of wine per week  . Drug use: No    Home Medications Prior to Admission medications   Medication Sig Start Date End Date Taking? Authorizing Provider  acetaminophen (TYLENOL) 500 MG tablet Take 1,000 mg by mouth 3 (three) times daily.     [provider]  Calcium Carb-Cholecalciferol (CALCIUM-VITAMIN D) 500-400 MG-UNIT TABS Take 1 tablet by mouth 2 (two) times daily.     [provider]  cholecalciferol (VITAMIN D) 1000 UNITS tablet Take 2,000 Units by mouth daily.     [provider]  clopidogrel (PLAVIX) 75 MG tablet Take 75 mg by mouth daily.    [provider]  donepezil (ARICEPT) 10 MG tablet TAKE 1 TABLET BY MOUTH AT BEDTIME 02/15/20   Garvin Fila, MD  famotidine (PEPCID) 20 MG tablet Take 20 mg by mouth 2 (two) times daily.    [provider]  hydrocortisone (ANUSOL-HC) 2.5 % rectal cream Place 1 application rectally 2 (two) times daily as needed for hemorrhoids or anal itching.    [provider]  loperamide (IMODIUM) 2 MG capsule Take 2 mg  by mouth as needed for diarrhea or loose stools.  08/07/19   [provider]  LORazepam (ATIVAN) 0.5 MG tablet Take 1 tablet (0.5 mg total) by mouth 2 (two) times daily as needed for anxiety. 02/12/19   Aline August, MD  memantine (NAMENDA) 10 MG tablet Take 10 mg by mouth 2 (two) times daily.  09/25/19   [provider]  methocarbamol (ROBAXIN) 500 MG tablet Take 500 mg by mouth 3 (three) times daily as needed for muscle spasms.  08/15/19   [provider]  Multiple Vitamins-Minerals (CENTRUM SILVER) CHEW Chew 1 tablet by mouth daily.    [provider]  Polyethyl Glycol-Propyl Glycol (SYSTANE OP) Apply 1-2 drops to eye 2 (two) times daily.    [provider]  polyethylene glycol powder (GLYCOLAX/MIRALAX) powder Take 17 g by mouth daily as needed for mild constipation. 02/12/19   Aline August, MD  potassium chloride SA (KLOR-CON) 20 MEQ tablet Take 20 mEq by mouth 3 (three) times a week.    [provider]  PROCTOZONE-HC 2.5 % rectal cream Place 1 application rectally 2 (two) times daily as needed for hemorrhoids or anal itching.  09/11/19   [provider]  Propylene Glycol (SYSTANE BALANCE OP) Apply 2 drops to eye 2 (two) times daily.    [provider]  traMADol (ULTRAM) 50 MG tablet Take 50 mg by mouth every 8 (eight) hours as needed for moderate pain or severe pain.  10/10/19   [provider]    Allergies    Aspirin, Crestor [rosuvastatin], Bactrim, Demeclocycline, Niacin, Niacin and related, Penicillins, Relafen [nabumetone], Sulfa antibiotics, and Tetracyclines & related  Review of Systems   Review of Systems Unable to reveiew systems due to dementia Physical Exam Updated Vital Signs BP 108/70   Pulse 85   Temp (!) 97.5 F (36.4 C) (Oral)   Resp (!) 21   SpO2 94%   Physical Exam Vitals and nursing note reviewed.  Constitutional:      General: She is not in acute distress.    Appearance: She is  well-developed. She is not diaphoretic.  HENT:     Head: Normocephalic and atraumatic.  Eyes:     General: Scleral icterus present.     Conjunctiva/sclera: Conjunctivae normal.  Cardiovascular:     Rate and Rhythm: Normal rate and regular rhythm.     Heart sounds: Normal heart sounds. No murmur. No friction rub. No gallop.   Pulmonary:     Effort: No respiratory distress.     Breath sounds: Rales present.     Comments: Patient became very winded after having to sit up with support for slung  examination. BL Coarse crackles lower and mid lung fields.  Abdominal:     General: Bowel sounds are normal. There is no distension.     Palpations: Abdomen is soft. There is no mass.     Tenderness: There is no abdominal tenderness. There is no guarding.  Musculoskeletal:        General: No swelling.     Cervical back: Normal range of motion.     Right lower leg: Edema present.     Left lower leg: Edema present.     Comments: 2+ edema BL LE  Skin:    General: Skin is warm and dry.     Coloration: Skin is jaundiced.  Neurological:     Mental Status: She is alert and oriented to person, place, and time.  Psychiatric:        Behavior: Behavior normal.     ED Results / Procedures / Treatments   Labs (all labs ordered are listed, but only abnormal results are displayed) Labs Reviewed  CBC WITH DIFFERENTIAL/PLATELET - Abnormal; Notable for the following components:      Result Value   WBC 25.4 (*)    RDW 17.4 (*)    All other components within normal limits  CULTURE, BLOOD (ROUTINE X 2)  CULTURE, BLOOD (ROUTINE X 2)  URINE CULTURE  SARS CORONAVIRUS 2 BY RT PCR (HOSPITAL ORDER, [facility] [facility])  LACTIC ACID, PLASMA  LACTIC ACID, PLASMA  APTT  PROTIME-INR  URINALYSIS, ROUTINE W REFLEX MICROSCOPIC  BRAIN NATRIURETIC PEPTIDE  LIPASE, BLOOD  AMMONIA  BASIC METABOLIC PANEL  HEPATIC FUNCTION PANEL  ACETAMINOPHEN LEVEL  HEPATITIS PANEL, ACUTE    EKG EKG  Interpretation  Date/Time:  Wednesday June 01 2020 07:54:16 EDT Ventricular Rate:  82 PR Interval:    QRS Duration: 83 QT Interval:  416 QTC Calculation: 486 R Axis:   -51 Text Interpretation: Sinus rhythm LAD, consider left anterior fascicular block Low voltage, precordial leads Probable left ventricular hypertrophy Anterior Q waves, possibly due to LVH Confirmed by Davonna Belling [phone]) on 06/01/2020 8:54:10 AM   Radiology No results found.  Procedures .Critical Care Performed by: Margarita Mail, PA-C Authorized by: Margarita Mail, PA-C   Critical care provider statement:    Critical care time (minutes):  70   Critical care was necessary to treat or prevent imminent or life-threatening deterioration of the following conditions:  Respiratory failure, hepatic failure and sepsis   Critical care was time spent personally by me on the following activities:  Discussions with consultants, evaluation of patient's response to treatment, examination of patient, ordering and performing treatments and interventions, ordering and review of laboratory studies, ordering and review of radiographic studies, pulse oximetry, re-evaluation of patient's condition, obtaining history from patient or surrogate and review of old charts   (including critical care time)  Medications Ordered in ED Medications  0.9 %  sodium chloride infusion (1,000 mLs Intravenous New Bag/Given 06/01/20 0801)  ceFEPIme (MAXIPIME) 2 g in sodium chloride 0.9 % 100 mL IVPB (has no administration in time range)  metroNIDAZOLE (FLAGYL) IVPB 500 mg (has no administration in time range)  vancomycin (VANCOCIN) IVPB 1000 mg/200 mL premix (has no administration in time range)    ED Course  I have reviewed the triage vital signs and the nursing notes.  Pertinent labs & imaging results that were available during my care of the patient were reviewed by me and considered in my medical decision making (see chart for  details).  Clinical Course as of Jun 01 1437  Wed Jun 01, 2020  0814 WBC(!): 25.4 [AH]  0818 Additional history obtained by the patient's daughter who was at bedside.  She states that normally she lives in an assisted living facility.  She does have a history of staph bacteremia requiring long-term parenteral antibiotics.  Her daughter took her to the urgent care yesterday.  She states that she was not jaundiced or icteric however the doctor did mention that she had some bilirubin in her urine.  Daughter states that she is on 3 g of Tylenol daily but only as needed and usually does not take that dose.  She states that she is definitely more confused than her baseline.   [AH]  0820 Patient with white blood cell count of 25,000, elevated respiratory rate and hypoxia.  She fits sepsis protocol.  I have initiated broad-spectrum antibiotics.   [AH]  0853 Potassium(!): 3.1 [AH]  0929 B Natriuretic Peptide(!): 684.2 [AH]  0929 Total Bilirubin(!): 9.3 [AH]  0929 Alkaline Phosphatase(!): 280 [AH]  0929 ALT(!): 100 [AH]  0929 Albumin(!): 2.9 [AH]  0929 AST(!): 149 [AH]  1034 Patient with worsening status.  Nausea and elevated rectal temperature.  Active rigors, worsening mental status change, agitation and confusion.  Patient requiring 4 L of nasal oxygen to maintain oxygen saturations above 90%.  She does not have a previous history of heart failure.  Question if the patient's obstructive jaundice could be placing pressure on the porta hepatis and causing fluid volume back up and pulmonary edema.  Clinically she has coarse rales in the lungs.  She is currently maintaining her oxygen status.  We will give 0.5 mg of Ativan to decrease the patient's agitation.   [AH]  1045 Lipase(!): 108 [AH]    Clinical Course User Index [AH] Margarita Mail, PA-C   MDM Rules/Calculators/A&P                      XT:[id number] and malaise VS: BP (!) 104/55   Pulse 87   Temp 100 F (37.8 C) (Rectal)   Resp (!) 22    Ht '4\' 11"'$  (1.499 m)   Wt 59 kg   SpO2 96%   BMI 26.26 kg/m  HG:DJMEQAS is gathered by EMS, daughter at bedside. Previous records obtained and reviewed. DDX:The patient's complaint of weakness involves an extensive number of diagnostic and treatment options, and is a complaint that carries with it a high risk of complications, morbidity, and potential mortality. Given the large differential diagnosis, medical decision making is of high complexity. The differential diagnosis of weakness includes but is not limited to neurologic causes (GBS, myasthenia gravis, CVA, MS, ALS, transverse myelitis, spinal cord injury, CVA, botulism, ) and other causes: ACS, Arrhythmia, syncope, orthostatic hypotension, sepsis, hypoglycemia, electrolyte disturbance, hypothyroidism, respiratory failure, hepatic failure, symptomatic anemia, dehydration, heat injury, polypharmacy, malignancy. Labs: I ordered reviewed and interpreted labs which include CBC which shows a white count of 25,000 with left shift.  BMP shows mild hypokalemia, hepatic function panel shows decreased protein and albumin level, elevated AST, ALT and alk phos with total bilirubin of 9.  Ammonia level with in normal limits along with PT/INR.  BNP is elevated.  Covid test negative.  Acute hepatitis panel is negative.  Lactic acid slightly elevated at 2.1.  Urine is pending.  Lipase elevated Imaging: I ordered and reviewed images which included CT angiogram of the chest with CT abdomen and pelvis.. I independently visualized and interpreted all  imaging. Significant findings include acute biliary obstruction.  EKG: Sinus rhythm at a rate of 82 Consults: Dr. Cristina Gong who requests MRCP when the patient is more stable. MDM: Patient here with malaise, jaundice, biliary obstruction.  She is also having hypoxic respiratory failure.  BNP is elevated.  She does have coarse crackles in the lungs.  She was given fluids for sepsis protocol.  Urine is currently pending.   Patient also known to have colitis on the CT scan.  Patient is DNR, DNI.  Her daughter and healthcare power of attorney is at bedside states that she is also does not wish for her to have any significantly invasive procedures including central lines.  Patient will be admitted to the hospitalist service. Patient disposition: Admit The patient appears reasonably stabilized for admission considering the current resources, flow, and capabilities available in the ED at this time, and I doubt any other [facility] requiring further screening and/or treatment in the ED prior to admission.        Final Clinical Impression(s) / ED Diagnoses Final diagnoses:  None    Rx / DC Orders ED Discharge Orders    None       Margarita Mail, PA-C 06/01/20 1444    Davonna Belling, MD 06/01/20 1512

## 2020-06-01 NOTE — ED Triage Notes (Signed)
Pt from [place] was dx with a UTI two days ago and has completed one round of her antibiotics, this am the staff report that she's more lethargic and has no appetite, also she usually gets around with a cane but hasn't been up yesterday or today They also noticed that she had defecated on herself which isn't normal

## 2020-06-01 NOTE — Plan of Care (Signed)

## 2020-06-01 NOTE — Consult Note (Signed)
[name] Consult  Referring Provider: ER Primary Care Physician:  Robyne Peers, MD Primary Gastroenterologist: Dr.Buccini  Reason for Consultation: Abnormal liver enzymes  HPI: [name] is a 86 y.o. female a resident of assisted living facility was brought to the ER after she developed altered mental status.  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  As per the patient's daughter patient recently developed change in mental status which was thought to be secondary to urinary tract infection and was started on ciprofloxacin.  She had one episode of nausea and vomiting and was called by the assisted living facility with complains that her mother did not look well.  Although patient has mild dementia at baseline, it seems that patient is very confused, agitated, and had received IV Ativan prior to me examining the patient.  On arrival to the ED, she was found to be hypoxic requiring 2 L oxygen via nasal cannula, and blood work showed abnormal and elevated liver enzymes. CT of the abdomen showed biliary dilation and GI was subsequently consulted.  As per her daughter, patient has not had any recent unintentional weight loss or change in appetite. There have been no reports of fever, abdominal pain, change in bowel habits. However, today she had an episode of fecal incontinence which has never happened before.  There was no evidence of blood in stool or black stools.  She has history of stroke and is on Plavix, last dose likely was yesterday.   Past Medical History:  Diagnosis Date  . Aortic aneurysm, thoracic (HCC)    4.4 cm  . Aortic dilatation ([place])    Seen on cath 2005  . Arthritis   . CAD (coronary artery disease)    a. Nonobstructive by last cath in 05/2004 with 30% LAD. b. EF 65-70% 12/2010. c. Hx ASA intolerance.   . Colitis    12/2010  associated with transient lower GI bleeding/anemia tx with abx  . GERD (gastroesophageal reflux disease)    a. Minimal hiatal hernia on EGD 2006, no obvious esophageal source for CP at that time.  . Hypercholesteremia    Hx of myalgias felt to be statin related but taking Crestor as OP  . Hypertension   . Osteoporosis   . Stroke [facility])    TIA    Past Surgical History:  Procedure Laterality Date  . ABDOMINAL HYSTERECTOMY    . BACK SURGERY     Disc disease  . hysterectomy -- unknown type    . SHOULDER SURGERY    . Shunt for syringomyelia 1987    . TEMPORAL ARTERY BIOPSY / LIGATION     Negative 2005    Prior to Admission medications   Medication Sig Start Date End Date Taking? Authorizing Provider  acetaminophen (TYLENOL) 500 MG tablet Take 1,000 mg by mouth in the morning, at noon, and at bedtime.    Yes [provider]  Calcium Carb-Cholecalciferol (CALCIUM-VITAMIN D) 500-400 MG-UNIT TABS Take 1 tablet by mouth 2 (two) times daily.    Yes [provider]  Cholecalciferol (VITAMIN D3) 50 MCG (2000 UT) TABS Take 1 tablet by mouth daily.   Yes [provider]  ciprofloxacin (CIPRO) 500 MG tablet Take 1 tablet by mouth in the morning and at bedtime. 05/31/20 06/07/20 Yes [provider]  clopidogrel (PLAVIX) 75 MG tablet Take 75 mg by mouth daily.  Yes [provider]  donepezil (ARICEPT) 10 MG tablet TAKE 1 TABLET BY MOUTH AT BEDTIME Patient taking differently: Take 10 mg by mouth at bedtime.  02/15/20  Yes Garvin Fila, MD  Loperamide HCl (ANTI-DIARRHEAL PO) Take 2 tablets by mouth 4 (four) times daily as needed (diarrhea).   Yes [provider]  LORazepam (ATIVAN) 0.5 MG tablet Take 1 tablet (0.5 mg total) by mouth 2 (two) times daily as needed for anxiety. Patient taking differently: Take 0.5 mg by mouth 2 (two) times daily as needed for anxiety or sleep.  02/12/19  Yes Aline August, MD  memantine (NAMENDA) 10 MG tablet Take 10 mg  by mouth 2 (two) times daily.  09/25/19  Yes [provider]  Multiple Vitamins-Minerals (ONE-A-DAY VITACRAVES IMMUNITY PO) Take 1 each by mouth daily.   Yes [provider]  nystatin-triamcinolone (MYCOLOG II) cream Apply 1 application topically 2 (two) times daily as needed (atrophic vaginitis). Apply topically to vaginal area 05/03/20  Yes [provider]  ondansetron (ZOFRAN-ODT) 8 MG disintegrating tablet Take 1 tablet by mouth every 8 (eight) hours as needed for nausea. Up to 5 days   Yes [provider]  pantoprazole (PROTONIX) 40 MG tablet Take 1 tablet by mouth daily. 05/03/20  Yes [provider]  Polyethyl Glycol-Propyl Glycol (SYSTANE OP) Place 2 drops into both eyes 2 (two) times daily.    Yes [provider]  potassium chloride SA (KLOR-CON) 20 MEQ tablet Take 20 mEq by mouth 3 (three) times a week.   Yes [provider]  Probiotic Product (ALIGN) 4 MG CAPS Take 1 capsule by mouth daily.   Yes [provider]  PROCTOZONE-HC 2.5 % rectal cream Place 1 application rectally 2 (two) times daily as needed for hemorrhoids or anal itching.  09/11/19  Yes [provider]  traMADol (ULTRAM) 50 MG tablet Take 50 mg by mouth every 8 (eight) hours as needed for moderate pain or severe pain.  10/10/19  Yes [provider]  polyethylene glycol powder (GLYCOLAX/MIRALAX) powder Take 17 g by mouth daily as needed for mild constipation. Patient not taking: Reported on 06/01/2020 02/12/19   Aline August, MD    Current Facility-Administered Medications  Medication Dose Route Frequency Provider Last Rate Last Admin  . 0.9 %  sodium chloride infusion  1,000 mL Intravenous Continuous Margarita Mail, PA-C   Stopped at 06/01/20 1006  . ceFEPIme (MAXIPIME) 2 g in sodium chloride 0.9 % 100 mL IVPB  2 g Intravenous Q12H Lenis Noon, RPH      . enoxaparin (LOVENOX) injection 30 mg  30 mg Subcutaneous Q24H Little Ishikawa,  MD      . lactated ringers 1,000 mL with potassium chloride 40 mEq infusion   Intravenous Once Little Ishikawa, MD      . metroNIDAZOLE (FLAGYL) IVPB 500 mg  500 mg Intravenous Q8H Little Ishikawa, MD      . ondansetron [facility]) tablet 4 mg  4 mg Oral Q6H PRN Little Ishikawa, MD       Or  . ondansetron [facility]) injection 4 mg  4 mg Intravenous Q6H PRN Little Ishikawa, MD      . potassium chloride 10 mEq in 100 mL IVPB  10 mEq Intravenous Q1 Hr x 2 Little Ishikawa, MD 100 mL/hr at 06/01/20 1214 10 mEq at 06/01/20 1214  . senna-docusate (Senokot-S) tablet 1 tablet  1 tablet Oral QHS PRN Little Ishikawa, MD      . [  START ON 06/02/2020] vancomycin (VANCOREADY) IVPB 750 mg/150 mL  750 mg Intravenous Q24H Lenis Noon, [facility]       Current Outpatient Medications  Medication Sig Dispense Refill  . acetaminophen (TYLENOL) 500 MG tablet Take 1,000 mg by mouth in the morning, at noon, and at bedtime.     . Calcium Carb-Cholecalciferol (CALCIUM-VITAMIN D) 500-400 MG-UNIT TABS Take 1 tablet by mouth 2 (two) times daily.     . Cholecalciferol (VITAMIN D3) 50 MCG (2000 UT) TABS Take 1 tablet by mouth daily.    . ciprofloxacin (CIPRO) 500 MG tablet Take 1 tablet by mouth in the morning and at bedtime.    . clopidogrel (PLAVIX) 75 MG tablet Take 75 mg by mouth daily.    Marland Kitchen donepezil (ARICEPT) 10 MG tablet TAKE 1 TABLET BY MOUTH AT BEDTIME (Patient taking differently: Take 10 mg by mouth at bedtime. ) 30 tablet 11  . Loperamide HCl (ANTI-DIARRHEAL PO) Take 2 tablets by mouth 4 (four) times daily as needed (diarrhea).    . LORazepam (ATIVAN) 0.5 MG tablet Take 1 tablet (0.5 mg total) by mouth 2 (two) times daily as needed for anxiety. (Patient taking differently: Take 0.5 mg by mouth 2 (two) times daily as needed for anxiety or sleep. ) 10 tablet 0  . memantine (NAMENDA) 10 MG tablet Take 10 mg by mouth 2 (two) times daily.     . Multiple Vitamins-Minerals (ONE-A-DAY VITACRAVES IMMUNITY  PO) Take 1 each by mouth daily.    Marland Kitchen nystatin-triamcinolone (MYCOLOG II) cream Apply 1 application topically 2 (two) times daily as needed (atrophic vaginitis). Apply topically to vaginal area    . ondansetron (ZOFRAN-ODT) 8 MG disintegrating tablet Take 1 tablet by mouth every 8 (eight) hours as needed for nausea. Up to 5 days    . pantoprazole (PROTONIX) 40 MG tablet Take 1 tablet by mouth daily.    Vladimir Faster Glycol-Propyl Glycol (SYSTANE OP) Place 2 drops into both eyes 2 (two) times daily.     . potassium chloride SA (KLOR-CON) 20 MEQ tablet Take 20 mEq by mouth 3 (three) times a week.    . Probiotic Product (ALIGN) 4 MG CAPS Take 1 capsule by mouth daily.    Marland Kitchen PROCTOZONE-HC 2.5 % rectal cream Place 1 application rectally 2 (two) times daily as needed for hemorrhoids or anal itching.     . traMADol (ULTRAM) 50 MG tablet Take 50 mg by mouth every 8 (eight) hours as needed for moderate pain or severe pain.     . polyethylene glycol powder (GLYCOLAX/MIRALAX) powder Take 17 g by mouth daily as needed for mild constipation. (Patient not taking: Reported on 06/01/2020)      Allergies as of 06/01/2020 - Review Complete 06/01/2020  Allergen Reaction Noted  . Sulfa antibiotics Swelling and Other (See Comments) 03/01/2016  . Aspirin Other (See Comments) 01/30/2012  . Crestor [rosuvastatin] Other (See Comments) 10/26/2013  . Bactrim Other (See Comments) 01/31/2011  . Demeclocycline Rash 01/31/2011  . Niacin and related Other (See Comments) 01/31/2011  . Penicillins Other (See Comments) 01/30/2012  . Relafen [nabumetone] Other (See Comments) 01/30/2012  . Tetracyclines & related Rash 01/31/2011    Family History  Problem Relation Age of Onset  . Heart disease Mother   . Heart failure Mother   . Arthritis Mother   . Heart disease Brother   . Heart disease Other   . Heart failure Other     Social History   Socioeconomic History  .  Marital status: Married    Spouse name: Not on file  .  Number of children: 2  . Years of education: college   . Highest education level: Not on file  Occupational History  . Occupation: retired   Tobacco Use  . Smoking status: Former Smoker    Quit date: 04/26/1959    Years since quitting: 61.1  . Smokeless tobacco: Never Used  Substance and Sexual Activity  . Alcohol use: No    Alcohol/week: 1.0 standard drinks    Types: 1 Glasses of wine per week  . Drug use: No  . Sexual activity: Never  Other Topics Concern  . Not on file  Social History Narrative   Patient lives with her husband    Patient is right handed   Patient drinks coffee daily   Social Determinants of Health   Financial Resource Strain:   . Difficulty of Paying Living Expenses:   Food Insecurity:   . Worried About Charity fundraiser in the Last Year:   . Arboriculturist in the Last Year:   Transportation Needs:   . Film/video editor (Medical):   Marland Kitchen Lack of Transportation (Non-Medical):   Physical Activity:   . Days of Exercise per Week:   . Minutes of Exercise per Session:   Stress:   . Feeling of Stress :   Social Connections:   . Frequency of Communication with Friends and Family:   . Frequency of Social Gatherings with Friends and Family:   . Attends Religious Services:   . Active Member of Clubs or Organizations:   . Attends Archivist Meetings:   Marland Kitchen Marital Status:   Intimate Partner Violence:   . Fear of Current or Ex-Partner:   . Emotionally Abused:   Marland Kitchen Physically Abused:   . Sexually Abused:     Review of Systems: Positive for: GI: Described in detail in HPI.    Gen: Denies any fever, chills, rigors, night sweats, anorexia, fatigue, weakness, malaise, involuntary weight loss, and sleep disorder CV: Denies chest pain, angina, palpitations, syncope, orthopnea, PND, peripheral edema, and claudication. Resp: Denies dyspnea, cough, sputum, wheezing, coughing up blood. GU : Denies urinary burning, blood in urine, urinary frequency,  urinary hesitancy, nocturnal urination, and urinary incontinence. MS: Denies joint pain or swelling.  Denies muscle weakness, cramps, atrophy.  Derm: Denies rash, itching, oral ulcerations, hives, unhealing ulcers.  Psych: confusion, denies depression, anxiety, memory loss, suicidal ideation, hallucinations. Heme: Denies bruising, bleeding, and enlarged lymph nodes. Neuro:  Denies any headaches, dizziness, paresthesias. Endo:  Denies any problems with DM, thyroid, adrenal function.  Physical Exam: Vital signs in last 24 hours: Temp:  [97.5 F (36.4 C)-100 F (37.8 C)] 100 F (37.8 C) (06/02 1024) Pulse Rate:  [81-110] 88 (06/02 1230) Resp:  [16-37] 27 (06/02 1230) BP: (95-166)/(52-73) 107/53 (06/02 1230) SpO2:  [87 %-96 %] 95 % (06/02 1230) Weight:  [59 kg] 59 kg (06/02 0911)    General: Elderly, sedated Head:  Normocephalic and atraumatic. Eyes:  Sclera clear,  icterus.   Conjunctiva pink. Ears:  Normal auditory acuity. Nose:  No deformity, discharge,  or lesions. Mouth:  No deformity or lesions.  Oropharynx pink & moist. Neck:  Supple; no masses or thyromegaly. Lungs: On 2 L oxygen via nasal cannula,  clear throughout to auscultation.   No wheezes, crackles, or rhonchi. No acute distress. Heart:  Regular rate and rhythm; no murmurs, clicks, rubs,  or gallops. Extremities:  Without clubbing  or edema. Neurologic: Sedated, not following commands Skin:  Intact without significant lesions or rashes. Psych:  Alert and cooperative. Normal mood and affect. Abdomen:  Soft, nontender and nondistended. No masses, hepatosplenomegaly or hernias noted. Normal bowel sounds, without guarding, and without rebound.         Lab Results: Recent Labs    06/01/20 0750  WBC 25.4*  HGB 13.0  HCT 37.8  PLT 396   BMET Recent Labs    06/01/20 0750  NA 137  K 3.1*  CL 96*  CO2 26  GLUCOSE 134*  BUN 17  CREATININE 0.90  CALCIUM 9.3   LFT Recent Labs    06/01/20 0750  PROT 6.3*   ALBUMIN 2.9*  AST 149*  ALT 100*  ALKPHOS 280*  BILITOT 9.3*  BILIDIR 6.1*  IBILI 3.2*   PT/INR Recent Labs    06/01/20 0750  LABPROT 13.2  INR 1.0    Studies/Results: CT Angio Chest PE W and/or Wo Contrast  Result Date: 06/01/2020 CLINICAL DATA:  Abdominal pain, shortness of breath.  Jaundice EXAM: CT ANGIOGRAPHY CHEST CT ABDOMEN AND PELVIS WITH CONTRAST TECHNIQUE: Multidetector CT imaging of the chest was performed using the standard protocol during bolus administration of intravenous contrast. Multiplanar CT image reconstructions and MIPs were obtained to evaluate the vascular anatomy. Multidetector CT imaging of the abdomen and pelvis was performed using the standard protocol during bolus administration of intravenous contrast. CONTRAST:  117mL OMNIPAQUE IOHEXOL 350 MG/ML SOLN COMPARISON:  CT 01/11/2020, 06/14/2014 FINDINGS: CTA CHEST FINDINGS Cardiovascular: Satisfactory opacification of the pulmonary arteries. No filling defect to the segmental branch level to suggest pulmonary embolism. The main pulmonary trunk is dilated measuring 3.9 cm in diameter. Mild cardiomegaly. Thoracic aortic aneurysm measuring 4.3 cm in diameter, stable from 2015. Atherosclerotic calcification of the aorta and coronary arteries. Mediastinum/Nodes: No enlarged mediastinal, hilar, or axillary lymph nodes. Thyroid gland, trachea, and esophagus demonstrate no significant findings. Small hiatal hernia. Lungs/Pleura: Mild central bronchovascular crowding. Similar degree of bronchiectasis within the right middle lobe which may reflect sequela of chronic infection. Right basilar atelectasis. No focal airspace consolidation, pleural effusion, or pneumothorax. Musculoskeletal: Mild superior endplate compression deformity of T8 with patchy sclerosis within the vertebral body and anterior fracture lucency likely a subacute fracture (series 6, images 76-80 no bony retropulsion. Chronic Schmorl's nodes at the superior  endplate of T9 and [id number]. Review of the MIP images confirms the above findings. CT ABDOMEN and PELVIS FINDINGS Hepatobiliary: There is new moderate intra and extrahepatic biliary dilatation. Common bile duct measures up to 1.8 cm in diameter with abrupt tapering at the level of the ampulla (series 7, image 40). No definite intraluminal stone. No well-defined mass or other obstructing lesion. New 1.2 cm indeterminate density lesion within the anterior left hepatic lobe (series 4, image 19). Additional rounded 1.1 cm lesion within the right hepatic lobe measuring at the upper limits of fluid density (series 4, image 23), also new from prior. Gallbladder is moderately distended. No hyperdense gallstone. Pancreas: Borderline prominence of the pancreatic duct, increased from prior. Mild parenchymal atrophy. No definite pancreatic mass. No peripancreatic inflammatory changes. Spleen: Normal in size without focal abnormality. Adrenals/Urinary Tract: Unremarkable adrenal glands. Probable small bilateral renal cysts. No stone or hydronephrosis. Urinary bladder unremarkable. Stomach/Bowel: Long segment diffuse wall thickening involving the ascending colon through the mid transverse colon. No pericolonic inflammatory changes. No diverticuli. Appendix not definitively identified. No pericecal inflammatory changes. No small bowel dilatation. Small hiatal hernia. Stomach appears otherwise  unremarkable. Vascular/Lymphatic: Aortoiliac atherosclerosis without aneurysm. No abdominopelvic lymphadenopathy. Reproductive: Status post hysterectomy. No adnexal masses. Other: No free air, free fluid, or intra-abdominal fluid collection. Tiny fat containing umbilical hernia. Musculoskeletal: Chronic compression deformities of L1 and L3 without interval progression from CT 01/11/2020. Unchanged grade 1 anterolisthesis L5 on S1. Review of the MIP images confirms the above findings. IMPRESSION: 1. Negative for acute pulmonary embolism. 2. New  moderate intra- and extrahepatic biliary dilatation. Common bile duct measures up to 1.8 cm in diameter with abrupt tapering at the level of the ampulla. No well-defined stone, mass, or other obstructing lesion. Further evaluation with MRCP is recommended. 3. Mild superior endplate compression deformity of T8 with patchy sclerosis within the vertebral body and anterior fracture lucency suggesting a subacute fracture. 4. New indeterminate density lesions within the liver, measuring up to 1.2 cm. These can be further characterized on previously recommended MRI. 5. Long segment diffuse wall thickening involving the ascending colon through the mid transverse colon, which may reflect infectious or inflammatory colitis. 6. Stable ascending thoracic aortic aneurysm measuring up to 4.3 cm in diameter. 7. Dilated main pulmonary trunk suggesting pulmonary arterial hypertension. 8. Aortic and coronary artery atherosclerosis. (ICD10-I70.0). Electronically Signed   By: Davina Poke D.O.   On: 06/01/2020 10:14   CT ABDOMEN PELVIS W CONTRAST  Result Date: 06/01/2020 CLINICAL DATA:  Abdominal pain, shortness of breath.  Jaundice EXAM: CT ANGIOGRAPHY CHEST CT ABDOMEN AND PELVIS WITH CONTRAST TECHNIQUE: Multidetector CT imaging of the chest was performed using the standard protocol during bolus administration of intravenous contrast. Multiplanar CT image reconstructions and MIPs were obtained to evaluate the vascular anatomy. Multidetector CT imaging of the abdomen and pelvis was performed using the standard protocol during bolus administration of intravenous contrast. CONTRAST:  112mL OMNIPAQUE IOHEXOL 350 MG/ML SOLN COMPARISON:  CT 01/11/2020, 06/14/2014 FINDINGS: CTA CHEST FINDINGS Cardiovascular: Satisfactory opacification of the pulmonary arteries. No filling defect to the segmental branch level to suggest pulmonary embolism. The main pulmonary trunk is dilated measuring 3.9 cm in diameter. Mild cardiomegaly. Thoracic  aortic aneurysm measuring 4.3 cm in diameter, stable from 2015. Atherosclerotic calcification of the aorta and coronary arteries. Mediastinum/Nodes: No enlarged mediastinal, hilar, or axillary lymph nodes. Thyroid gland, trachea, and esophagus demonstrate no significant findings. Small hiatal hernia. Lungs/Pleura: Mild central bronchovascular crowding. Similar degree of bronchiectasis within the right middle lobe which may reflect sequela of chronic infection. Right basilar atelectasis. No focal airspace consolidation, pleural effusion, or pneumothorax. Musculoskeletal: Mild superior endplate compression deformity of T8 with patchy sclerosis within the vertebral body and anterior fracture lucency likely a subacute fracture (series 6, images 76-80 no bony retropulsion. Chronic Schmorl's nodes at the superior endplate of T9 and [id number]. Review of the MIP images confirms the above findings. CT ABDOMEN and PELVIS FINDINGS Hepatobiliary: There is new moderate intra and extrahepatic biliary dilatation. Common bile duct measures up to 1.8 cm in diameter with abrupt tapering at the level of the ampulla (series 7, image 40). No definite intraluminal stone. No well-defined mass or other obstructing lesion. New 1.2 cm indeterminate density lesion within the anterior left hepatic lobe (series 4, image 19). Additional rounded 1.1 cm lesion within the right hepatic lobe measuring at the upper limits of fluid density (series 4, image 23), also new from prior. Gallbladder is moderately distended. No hyperdense gallstone. Pancreas: Borderline prominence of the pancreatic duct, increased from prior. Mild parenchymal atrophy. No definite pancreatic mass. No peripancreatic inflammatory changes. Spleen: Normal in size without focal  abnormality. Adrenals/Urinary Tract: Unremarkable adrenal glands. Probable small bilateral renal cysts. No stone or hydronephrosis. Urinary bladder unremarkable. Stomach/Bowel: Long segment diffuse wall  thickening involving the ascending colon through the mid transverse colon. No pericolonic inflammatory changes. No diverticuli. Appendix not definitively identified. No pericecal inflammatory changes. No small bowel dilatation. Small hiatal hernia. Stomach appears otherwise unremarkable. Vascular/Lymphatic: Aortoiliac atherosclerosis without aneurysm. No abdominopelvic lymphadenopathy. Reproductive: Status post hysterectomy. No adnexal masses. Other: No free air, free fluid, or intra-abdominal fluid collection. Tiny fat containing umbilical hernia. Musculoskeletal: Chronic compression deformities of L1 and L3 without interval progression from CT 01/11/2020. Unchanged grade 1 anterolisthesis L5 on S1. Review of the MIP images confirms the above findings. IMPRESSION: 1. Negative for acute pulmonary embolism. 2. New moderate intra- and extrahepatic biliary dilatation. Common bile duct measures up to 1.8 cm in diameter with abrupt tapering at the level of the ampulla. No well-defined stone, mass, or other obstructing lesion. Further evaluation with MRCP is recommended. 3. Mild superior endplate compression deformity of T8 with patchy sclerosis within the vertebral body and anterior fracture lucency suggesting a subacute fracture. 4. New indeterminate density lesions within the liver, measuring up to 1.2 cm. These can be further characterized on previously recommended MRI. 5. Long segment diffuse wall thickening involving the ascending colon through the mid transverse colon, which may reflect infectious or inflammatory colitis. 6. Stable ascending thoracic aortic aneurysm measuring up to 4.3 cm in diameter. 7. Dilated main pulmonary trunk suggesting pulmonary arterial hypertension. 8. Aortic and coronary artery atherosclerosis. (ICD10-I70.0). Electronically Signed   By: Davina Poke D.O.   On: 06/01/2020 10:14   DG Chest Port 1 View  Result Date: 06/01/2020 CLINICAL DATA:  Hypoxia EXAM: PORTABLE CHEST 1 VIEW  COMPARISON:  01/10/2020 FINDINGS: The heart size and mediastinal contours are within normal limits. Both lungs are clear. The visualized skeletal structures are unremarkable. IMPRESSION: No active disease. Electronically Signed   By: Kathreen Devoid   On: 06/01/2020 08:30    Impression: Sepsis-elevated WBC count of 25.4 on admission, has improved to 16.8, lactic acid 2.1 on admission, has improved to 1.8 Abnormally elevated liver enzymes T bili 9.3/AST 149/ALT 100/ALP 280 with new moderate intra and extrahepatic biliary dilation, CBD of 1.8 cm, abrupt tapering at ampulla, no defined mass or CBD stone, 1.2 cm anterior left hepatic lobe lesion, additional lesion 1.1 cm in right hepatic lobe. Gallbladder moderately distended. Borderline prominence of pancreatic duct, no definitive pancreatic mass. Minimally elevated lipase 108  Long segment diffuse wall thickening involving ascending through mid transverse colon-infectious or inflammatory colitis  Urine analysis showed large leukocytes, positive for nitrite, more than 50 WBCs noted  Acute hypoxia, currently on 2 L oxygen via nasal cannula Acute metabolic encephalopathy with baseline dementia Chronic kidney disease History of TIA, last dose of Plavix yesterday  Plan: Recommend MRI/MRCP to further evaluate biliary tree.  No history of fever or abdominal pain, and with minimally elevated AST /ALT, and no obvious stone noted on CAT scan, cholangitis is less likely, however MRCP will hopefully help evaluate the biliary tree better. Agree with starting broad-spectrum antibiotics.  Patient has been started on IV cefepime, IV Flagyl and IV vancomycin.  Recommend stool studies for C. difficile and GI pathogen panel    LOS: 0 days   Ronnette Juniper, MD  06/01/2020, 12:33 PM

## 2020-06-01 NOTE — ED Notes (Signed)
Patient transported to CT 

## 2020-06-01 NOTE — ED Notes (Signed)
Patient transported to MRI 

## 2020-06-01 NOTE — ED Notes (Signed)
Patient became hypoxic and restless, placed on 2.5L O2 via [place] with improvement in oxygen sats.  Alvino Chapel, EDP and [place], [place] both aware and in room assessing patient.  Patient continued to be restless, one time dose of ativan given with improvement in restlessness.  Respiratory at bedside collecting ABG.

## 2020-06-01 NOTE — H&P (Signed)
History and Physical    [name]:[id number] DOB: 1926-03-05 DOA: 06/01/2020  PCP: Robyne Peers, MD   Chief Complaint: Malaise/fatigue  HPI: [name] is a 86 y.o. female with medical history significant for dementia, nonobstructive CAD, history of TIA who presents from assisted living facility with daughter who is POA for worsening mental status. Patient was known to be at baseline over the weekend some 4 to 5 days ago, at baseline patient is able to perform ADLs but does have some advancing dementia with difficulty with recall and memory. Patient had worsening mental status over the past 48 hours and was taken to urgent care by daughter and diagnosed with UTI/cystitis yesterday on 05/31/2020. Patient was prescribed Cipro but was unable to take this due to worsening nausea and mental status changes. Patient subsequently presents to the ED today with daughter for mental status changes in the setting of UTI nausea and vomiting.  ED Course: In ED patient met sepsis criteria during IV fluid bolus and labs. Patient also noted to be somewhat hypoxic requiring supplemental oxygen, CTA chest performed without overt findings of PE/pneumonia; CT abdomen performed without obvious bowel obstruction however there was concern for possible wall thickening of the mid transverse colon as well as biliary dilation on CT up to 1.8 cm in diameter without clear obstruction. Labs were remarkable for elevated bilirubin AST and ALT well above baseline. GI was consulted, patient's personal GI Dr. Janeece Riggers recommended MRCP which is currently pending.  Review of Systems: Unable to review given patient's mental status.   Assessment/Plan Principal Problem:   Sepsis secondary to UTI [facility]) Active Problems:   Acute metabolic encephalopathy   Elevated bilirubin   Acute sepsis, multifactorial POA Concurrent acute cystitis, failure of outpatient therapy Rule out colitis versus cholangitis - Patient on  Cipro outpatient, unable to tolerate p.o. transition to cefepime, Flagyl, vancomycin in the ED -we will continue as inpatient Multiple antibiotic allergies/intolerances, de-escalate pending cultures and imaging above -Decrease IV fluid burden to 75 cc/h with LR+40K given hypokalemia as below -Hold acetaminophen given elevated AST ALT and bilirubin; patient is intolerant to NSAIDs per daughter  Acute metabolic encephalopathy on baseline dementia, POA - Likely multifactorial in the setting of above sepsis/acute infection - Ammonia within normal limits despite elevated AST ALT and bilirubin -Patient remains high risk for delirium given baseline dementia, patient did receive Ativan in the ED as well  Elevated bilirubin, AST ALT concerning for obstructive jaundice -CT abdomen pelvis as above concerning for obstruction, consistent with elevated bilirubin - MRCP pending per Dr. Cristina Gong recommendations - Hold acetaminophen despite low-grade fevers as above - Follow repeat labs: Hepatic Function Latest Ref Rng & Units 06/01/2020 01/13/2020 01/11/2020  Total Protein 6.5 - 8.1 g/dL 6.3(L) 5.7(L) 5.8(L)  Albumin 3.5 - 5.0 g/dL 2.9(L) 2.9(L) 3.2(L)  AST 15 - 41 U/L 149(H) 21 27  ALT 0 - 44 U/L 100(H) 13 19  Alk Phosphatase 38 - 126 U/L 280(H) 36(L) 33(L)  Total Bilirubin 0.3 - 1.2 mg/dL 9.3(H) 0.7 0.9  Bilirubin, Direct 0.0 - 0.2 mg/dL 6.1(H) - -   Acute hypoxic respiratory failure -Likely secondary to IV fluid bolus challenge given patient's stature and advanced age  AKI without history of CKD  -Baseline creatinine 0.4-0.5, with GFR > 60 Lab Results  Component Value Date   CREATININE 0.90 06/01/2020   CREATININE 0.52 01/13/2020   CREATININE 0.56 01/12/2020   Hypokalemia, POA -Continue LR +40 K x1L, an additional 20 IV x1  potassium given -Follow morning labs Lab Results  Component Value Date   K 3.1 (L) 06/01/2020   History of TIA -Patient reports intolerance to aspirin, continue home  Plavix once able to tolerate p.o. safely  DVT prophylaxis: Lovenox Code Status: DNR -confirmed with daughter POA at bedside Family Communication: Daughter at bedside (POA) Status is: Inpatient  Dispo: The patient is from: Assisted living facility              Anticipated d/c is to: To be determined              Anticipated d/c date is: 72 to 96 hours pending clinical course              Patient currently not medically stable for discharge due to ongoing need for IV fluids, close monitoring, IV antibiotics, possible surgical intervention and evaluation, further imaging and critical illness in the setting of sepsis as above  Consultants:   None  Procedures:   None   Past Medical History:  Diagnosis Date  . Aortic aneurysm, thoracic (HCC)    4.4 cm  . Aortic dilatation ([place])    Seen on cath 2005  . Arthritis   . CAD (coronary artery disease)    a. Nonobstructive by last cath in 05/2004 with 30% LAD. b. EF 65-70% 12/2010. c. Hx ASA intolerance.   . Colitis    12/2010 associated with transient lower GI bleeding/anemia tx with abx  . GERD (gastroesophageal reflux disease)    a. Minimal hiatal hernia on EGD 2006, no obvious esophageal source for CP at that time.  . Hypercholesteremia    Hx of myalgias felt to be statin related but taking Crestor as OP  . Hypertension   . Osteoporosis   . Stroke [facility])    TIA    Past Surgical History:  Procedure Laterality Date  . ABDOMINAL HYSTERECTOMY    . BACK SURGERY     Disc disease  . hysterectomy -- unknown type    . SHOULDER SURGERY    . Shunt for syringomyelia 1987    . TEMPORAL ARTERY BIOPSY / LIGATION     Negative 2005     reports that she quit smoking about 61 years ago. She has never used smokeless tobacco. She reports that she does not drink alcohol or use drugs.  Allergies  Allergen Reactions  . Sulfa Antibiotics Swelling and Other (See Comments)    Throat swelling  . Aspirin Other (See Comments)    Feels like choking    . Crestor [Rosuvastatin] Other (See Comments)    Lower Extremity Myalgia   . Bactrim Other (See Comments)    Unknown  . Demeclocycline Rash  . Niacin And Related Other (See Comments)    Turns red in the face  . Penicillins Other (See Comments)    Unknown Reaction Per The Patient Unknown  . Relafen [Nabumetone] Other (See Comments)    Unknown Reaction Per The Patient Unknown  . Tetracyclines & Related Rash    Family History  Problem Relation Age of Onset  . Heart disease Mother   . Heart failure Mother   . Arthritis Mother   . Heart disease Brother   . Heart disease Other   . Heart failure Other     Prior to Admission medications   Medication Sig Start Date End Date Taking? Authorizing Provider  acetaminophen (TYLENOL) 500 MG tablet Take 1,000 mg by mouth in the morning, at noon, and at bedtime.  Yes [provider]  Calcium Carb-Cholecalciferol (CALCIUM-VITAMIN D) 500-400 MG-UNIT TABS Take 1 tablet by mouth 2 (two) times daily.    Yes [provider]  Cholecalciferol (VITAMIN D3) 50 MCG (2000 UT) TABS Take 1 tablet by mouth daily.   Yes [provider]  ciprofloxacin (CIPRO) 500 MG tablet Take 1 tablet by mouth in the morning and at bedtime. 05/31/20 06/07/20 Yes [provider]  clopidogrel (PLAVIX) 75 MG tablet Take 75 mg by mouth daily.   Yes [provider]  donepezil (ARICEPT) 10 MG tablet TAKE 1 TABLET BY MOUTH AT BEDTIME Patient taking differently: Take 10 mg by mouth at bedtime.  02/15/20  Yes Garvin Fila, MD  Loperamide HCl (ANTI-DIARRHEAL PO) Take 2 tablets by mouth 4 (four) times daily as needed (diarrhea).   Yes [provider]  LORazepam (ATIVAN) 0.5 MG tablet Take 1 tablet (0.5 mg total) by mouth 2 (two) times daily as needed for anxiety. Patient taking differently: Take 0.5 mg by mouth 2 (two) times daily as needed for anxiety or sleep.  02/12/19  Yes Aline August, MD  memantine (NAMENDA) 10 MG tablet Take  10 mg by mouth 2 (two) times daily.  09/25/19  Yes [provider]  Multiple Vitamins-Minerals (ONE-A-DAY VITACRAVES IMMUNITY PO) Take 1 each by mouth daily.   Yes [provider]  nystatin-triamcinolone (MYCOLOG II) cream Apply 1 application topically 2 (two) times daily as needed (atrophic vaginitis). Apply topically to vaginal area 05/03/20  Yes [provider]  ondansetron (ZOFRAN-ODT) 8 MG disintegrating tablet Take 1 tablet by mouth every 8 (eight) hours as needed for nausea. Up to 5 days   Yes [provider]  pantoprazole (PROTONIX) 40 MG tablet Take 1 tablet by mouth daily. 05/03/20  Yes [provider]  Polyethyl Glycol-Propyl Glycol (SYSTANE OP) Place 2 drops into both eyes 2 (two) times daily.    Yes [provider]  potassium chloride SA (KLOR-CON) 20 MEQ tablet Take 20 mEq by mouth 3 (three) times a week.   Yes [provider]  Probiotic Product (ALIGN) 4 MG CAPS Take 1 capsule by mouth daily.   Yes [provider]  PROCTOZONE-HC 2.5 % rectal cream Place 1 application rectally 2 (two) times daily as needed for hemorrhoids or anal itching.  09/11/19  Yes [provider]  traMADol (ULTRAM) 50 MG tablet Take 50 mg by mouth every 8 (eight) hours as needed for moderate pain or severe pain.  10/10/19  Yes [provider]  polyethylene glycol powder (GLYCOLAX/MIRALAX) powder Take 17 g by mouth daily as needed for mild constipation. Patient not taking: Reported on 06/01/2020 02/12/19   Aline August, MD    Physical Exam: Vitals:   06/01/20 1024 06/01/20 1100 06/01/20 1101 06/01/20 1110  BP:  (!) 166/73    Pulse:  (!) 110 (!) 109 (!) 105  Resp:  (!) 37 (!) 35 (!) 34  Temp: 100 F (37.8 C)     TempSrc: Rectal     SpO2:  (!) 87% 93% 93%  Weight:      Height:        Constitutional: NAD, calm, comfortable Vitals:   06/01/20 1024 06/01/20 1100 06/01/20 1101 06/01/20 1110  BP:  (!) 166/73    Pulse:  (!)  110 (!) 109 (!) 105  Resp:  (!) 37 (!) 35 (!) 34  Temp: 100 F (37.8 C)     TempSrc: Rectal     SpO2:  Marland Kitchen)  87% 93% 93%  Weight:      Height:       General: Somnolent, no acute distress, unable to follow commands, localizes to pain HEENT:  Normocephalic atraumatic.  Sclerae nonicteric, noninjected.  Extraocular movements intact bilaterally. Neck:  Without mass or deformity.  Trachea is midline. Lungs: Bilateral end expiratory wheeze without overt rales or rhonchi Heart: Tachycardic but regular rhythm.  Without murmurs, rubs, or gallops. Abdomen: Nondistended, appears to be diffusely tender given patient's guarding. Extremities: Without cyanosis, clubbing, edema, or obvious deformity. Vascular:  Dorsalis pedis and posterior tibial pulses palpable bilaterally. Skin: Jaundiced, otherwise warm and dry, no erythema, no ulcerations.  Labs on Admission: I have personally reviewed following labs and imaging studies  CBC: Recent Labs  Lab 06/01/20 0750  WBC 25.4*  NEUTROABS 21.3*  HGB 13.0  HCT 37.8  MCV 92.0  PLT 390   Basic Metabolic Panel: Recent Labs  Lab 06/01/20 0750  NA 137  K 3.1*  CL 96*  CO2 26  GLUCOSE 134*  BUN 17  CREATININE 0.90  CALCIUM 9.3   GFR: Estimated Creatinine Clearance: 31.8 mL/min (by C-G formula based on SCr of 0.9 mg/dL). Liver Function Tests: Recent Labs  Lab 06/01/20 0750  AST 149*  ALT 100*  ALKPHOS 280*  BILITOT 9.3*  PROT 6.3*  ALBUMIN 2.9*   Recent Labs  Lab 06/01/20 0750  LIPASE 108*   Recent Labs  Lab 06/01/20 0750  AMMONIA 23   Coagulation Profile: Recent Labs  Lab 06/01/20 0750  INR 1.0   Cardiac Enzymes: No results for input(s): CKTOTAL, CKMB, CKMBINDEX, TROPONINI in the last 168 hours. BNP (last 3 results) No results for input(s): PROBNP in the last 8760 hours. HbA1C: No results for input(s): HGBA1C in the last 72 hours. CBG: No results for input(s): GLUCAP in the last 168 hours. Lipid Profile: No results  for input(s): CHOL, HDL, LDLCALC, TRIG, CHOLHDL, LDLDIRECT in the last 72 hours. Thyroid Function Tests: No results for input(s): TSH, T4TOTAL, FREET4, T3FREE, THYROIDAB in the last 72 hours. Anemia Panel: No results for input(s): VITAMINB12, FOLATE, FERRITIN, TIBC, IRON, RETICCTPCT in the last 72 hours. Urine analysis:    Component Value Date/Time   COLORURINE AMBER (A) 06/01/2020 1048   APPEARANCEUR HAZY (A) 06/01/2020 1048   LABSPEC 1.040 (H) 06/01/2020 1048   PHURINE 6.0 06/01/2020 1048   GLUCOSEU NEGATIVE 06/01/2020 1048   HGBUR SMALL (A) 06/01/2020 1048   BILIRUBINUR NEGATIVE 06/01/2020 1048   BILIRUBINUR neg 03/07/2015 1506   KETONESUR NEGATIVE 06/01/2020 1048   PROTEINUR 30 (A) 06/01/2020 1048   UROBILINOGEN 0.2 03/07/2015 1506   UROBILINOGEN 0.2 07/30/2014 1236   NITRITE POSITIVE (A) 06/01/2020 1048   LEUKOCYTESUR LARGE (A) 06/01/2020 1048    Radiological Exams on Admission: CT Angio Chest PE W and/or Wo Contrast  Result Date: 06/01/2020 CLINICAL DATA:  Abdominal pain, shortness of breath.  Jaundice EXAM: CT ANGIOGRAPHY CHEST CT ABDOMEN AND PELVIS WITH CONTRAST TECHNIQUE: Multidetector CT imaging of the chest was performed using the standard protocol during bolus administration of intravenous contrast. Multiplanar CT image reconstructions and MIPs were obtained to evaluate the vascular anatomy. Multidetector CT imaging of the abdomen and pelvis was performed using the standard protocol during bolus administration of intravenous contrast. CONTRAST:  125m OMNIPAQUE IOHEXOL 350 MG/ML SOLN COMPARISON:  CT 01/11/2020, 06/14/2014 FINDINGS: CTA CHEST FINDINGS Cardiovascular: Satisfactory opacification of the pulmonary arteries. No filling defect to the segmental branch level to suggest pulmonary embolism. The main pulmonary trunk is  dilated measuring 3.9 cm in diameter. Mild cardiomegaly. Thoracic aortic aneurysm measuring 4.3 cm in diameter, stable from 2015. Atherosclerotic  calcification of the aorta and coronary arteries. Mediastinum/Nodes: No enlarged mediastinal, hilar, or axillary lymph nodes. Thyroid gland, trachea, and esophagus demonstrate no significant findings. Small hiatal hernia. Lungs/Pleura: Mild central bronchovascular crowding. Similar degree of bronchiectasis within the right middle lobe which may reflect sequela of chronic infection. Right basilar atelectasis. No focal airspace consolidation, pleural effusion, or pneumothorax. Musculoskeletal: Mild superior endplate compression deformity of T8 with patchy sclerosis within the vertebral body and anterior fracture lucency likely a subacute fracture (series 6, images 76-80 no bony retropulsion. Chronic Schmorl's nodes at the superior endplate of T9 and Q75. Review of the MIP images confirms the above findings. CT ABDOMEN and PELVIS FINDINGS Hepatobiliary: There is new moderate intra and extrahepatic biliary dilatation. Common bile duct measures up to 1.8 cm in diameter with abrupt tapering at the level of the ampulla (series 7, image 40). No definite intraluminal stone. No well-defined mass or other obstructing lesion. New 1.2 cm indeterminate density lesion within the anterior left hepatic lobe (series 4, image 19). Additional rounded 1.1 cm lesion within the right hepatic lobe measuring at the upper limits of fluid density (series 4, image 23), also new from prior. Gallbladder is moderately distended. No hyperdense gallstone. Pancreas: Borderline prominence of the pancreatic duct, increased from prior. Mild parenchymal atrophy. No definite pancreatic mass. No peripancreatic inflammatory changes. Spleen: Normal in size without focal abnormality. Adrenals/Urinary Tract: Unremarkable adrenal glands. Probable small bilateral renal cysts. No stone or hydronephrosis. Urinary bladder unremarkable. Stomach/Bowel: Long segment diffuse wall thickening involving the ascending colon through the mid transverse colon. No pericolonic  inflammatory changes. No diverticuli. Appendix not definitively identified. No pericecal inflammatory changes. No small bowel dilatation. Small hiatal hernia. Stomach appears otherwise unremarkable. Vascular/Lymphatic: Aortoiliac atherosclerosis without aneurysm. No abdominopelvic lymphadenopathy. Reproductive: Status post hysterectomy. No adnexal masses. Other: No free air, free fluid, or intra-abdominal fluid collection. Tiny fat containing umbilical hernia. Musculoskeletal: Chronic compression deformities of L1 and L3 without interval progression from CT 01/11/2020. Unchanged grade 1 anterolisthesis L5 on S1. Review of the MIP images confirms the above findings. IMPRESSION: 1. Negative for acute pulmonary embolism. 2. New moderate intra- and extrahepatic biliary dilatation. Common bile duct measures up to 1.8 cm in diameter with abrupt tapering at the level of the ampulla. No well-defined stone, mass, or other obstructing lesion. Further evaluation with MRCP is recommended. 3. Mild superior endplate compression deformity of T8 with patchy sclerosis within the vertebral body and anterior fracture lucency suggesting a subacute fracture. 4. New indeterminate density lesions within the liver, measuring up to 1.2 cm. These can be further characterized on previously recommended MRI. 5. Long segment diffuse wall thickening involving the ascending colon through the mid transverse colon, which may reflect infectious or inflammatory colitis. 6. Stable ascending thoracic aortic aneurysm measuring up to 4.3 cm in diameter. 7. Dilated main pulmonary trunk suggesting pulmonary arterial hypertension. 8. Aortic and coronary artery atherosclerosis. (ICD10-I70.0). Electronically Signed   By: Davina Poke D.O.   On: 06/01/2020 10:14   CT ABDOMEN PELVIS W CONTRAST  Result Date: 06/01/2020 CLINICAL DATA:  Abdominal pain, shortness of breath.  Jaundice EXAM: CT ANGIOGRAPHY CHEST CT ABDOMEN AND PELVIS WITH CONTRAST TECHNIQUE:  Multidetector CT imaging of the chest was performed using the standard protocol during bolus administration of intravenous contrast. Multiplanar CT image reconstructions and MIPs were obtained to evaluate the vascular anatomy. Multidetector CT imaging  of the abdomen and pelvis was performed using the standard protocol during bolus administration of intravenous contrast. CONTRAST:  161m OMNIPAQUE IOHEXOL 350 MG/ML SOLN COMPARISON:  CT 01/11/2020, 06/14/2014 FINDINGS: CTA CHEST FINDINGS Cardiovascular: Satisfactory opacification of the pulmonary arteries. No filling defect to the segmental branch level to suggest pulmonary embolism. The main pulmonary trunk is dilated measuring 3.9 cm in diameter. Mild cardiomegaly. Thoracic aortic aneurysm measuring 4.3 cm in diameter, stable from 2015. Atherosclerotic calcification of the aorta and coronary arteries. Mediastinum/Nodes: No enlarged mediastinal, hilar, or axillary lymph nodes. Thyroid gland, trachea, and esophagus demonstrate no significant findings. Small hiatal hernia. Lungs/Pleura: Mild central bronchovascular crowding. Similar degree of bronchiectasis within the right middle lobe which may reflect sequela of chronic infection. Right basilar atelectasis. No focal airspace consolidation, pleural effusion, or pneumothorax. Musculoskeletal: Mild superior endplate compression deformity of T8 with patchy sclerosis within the vertebral body and anterior fracture lucency likely a subacute fracture (series 6, images 76-80 no bony retropulsion. Chronic Schmorl's nodes at the superior endplate of T9 and TE31 Review of the MIP images confirms the above findings. CT ABDOMEN and PELVIS FINDINGS Hepatobiliary: There is new moderate intra and extrahepatic biliary dilatation. Common bile duct measures up to 1.8 cm in diameter with abrupt tapering at the level of the ampulla (series 7, image 40). No definite intraluminal stone. No well-defined mass or other obstructing lesion. New  1.2 cm indeterminate density lesion within the anterior left hepatic lobe (series 4, image 19). Additional rounded 1.1 cm lesion within the right hepatic lobe measuring at the upper limits of fluid density (series 4, image 23), also new from prior. Gallbladder is moderately distended. No hyperdense gallstone. Pancreas: Borderline prominence of the pancreatic duct, increased from prior. Mild parenchymal atrophy. No definite pancreatic mass. No peripancreatic inflammatory changes. Spleen: Normal in size without focal abnormality. Adrenals/Urinary Tract: Unremarkable adrenal glands. Probable small bilateral renal cysts. No stone or hydronephrosis. Urinary bladder unremarkable. Stomach/Bowel: Long segment diffuse wall thickening involving the ascending colon through the mid transverse colon. No pericolonic inflammatory changes. No diverticuli. Appendix not definitively identified. No pericecal inflammatory changes. No small bowel dilatation. Small hiatal hernia. Stomach appears otherwise unremarkable. Vascular/Lymphatic: Aortoiliac atherosclerosis without aneurysm. No abdominopelvic lymphadenopathy. Reproductive: Status post hysterectomy. No adnexal masses. Other: No free air, free fluid, or intra-abdominal fluid collection. Tiny fat containing umbilical hernia. Musculoskeletal: Chronic compression deformities of L1 and L3 without interval progression from CT 01/11/2020. Unchanged grade 1 anterolisthesis L5 on S1. Review of the MIP images confirms the above findings. IMPRESSION: 1. Negative for acute pulmonary embolism. 2. New moderate intra- and extrahepatic biliary dilatation. Common bile duct measures up to 1.8 cm in diameter with abrupt tapering at the level of the ampulla. No well-defined stone, mass, or other obstructing lesion. Further evaluation with MRCP is recommended. 3. Mild superior endplate compression deformity of T8 with patchy sclerosis within the vertebral body and anterior fracture lucency suggesting  a subacute fracture. 4. New indeterminate density lesions within the liver, measuring up to 1.2 cm. These can be further characterized on previously recommended MRI. 5. Long segment diffuse wall thickening involving the ascending colon through the mid transverse colon, which may reflect infectious or inflammatory colitis. 6. Stable ascending thoracic aortic aneurysm measuring up to 4.3 cm in diameter. 7. Dilated main pulmonary trunk suggesting pulmonary arterial hypertension. 8. Aortic and coronary artery atherosclerosis. (ICD10-I70.0). Electronically Signed   By: NDavina PokeD.O.   On: 06/01/2020 10:14   DG Chest Port 1 View  Result  Date: 06/01/2020 CLINICAL DATA:  Hypoxia EXAM: PORTABLE CHEST 1 VIEW COMPARISON:  01/10/2020 FINDINGS: The heart size and mediastinal contours are within normal limits. Both lungs are clear. The visualized skeletal structures are unremarkable. IMPRESSION: No active disease. Electronically Signed   By: Kathreen Devoid   On: 06/01/2020 08:30    EKG: Independently reviewed. NSR without overt ST elevations/depressions  Little Ishikawa DO [facility]  If 7PM-7AM, please contact night-coverage [email or web address]   06/01/2020, 11:53 AM

## 2020-06-02 DIAGNOSIS — G9341 Metabolic encephalopathy: Secondary | ICD-10-CM

## 2020-06-02 DIAGNOSIS — K8689 Other specified diseases of pancreas: Secondary | ICD-10-CM

## 2020-06-02 DIAGNOSIS — R7989 Other specified abnormal findings of blood chemistry: Secondary | ICD-10-CM

## 2020-06-02 DIAGNOSIS — R17 Unspecified jaundice: Secondary | ICD-10-CM

## 2020-06-02 DIAGNOSIS — K529 Noninfective gastroenteritis and colitis, unspecified: Secondary | ICD-10-CM

## 2020-06-02 DIAGNOSIS — D72829 Elevated white blood cell count, unspecified: Secondary | ICD-10-CM

## 2020-06-02 LAB — CBC
HCT: 30.7 % — ABNORMAL LOW (ref 36.0–46.0)
Hemoglobin: 10.3 g/dL — ABNORMAL LOW (ref 12.0–15.0)
MCH: 30.8 pg (ref 26.0–34.0)
MCHC: 33.6 g/dL (ref 30.0–36.0)
MCV: 91.9 fL (ref 80.0–100.0)
Platelets: 275 10*3/uL (ref 150–400)
RBC: 3.34 MIL/uL — ABNORMAL LOW (ref 3.87–5.11)
RDW: 17.8 % — ABNORMAL HIGH (ref 11.5–15.5)
WBC: 18.9 10*3/uL — ABNORMAL HIGH (ref 4.0–10.5)
nRBC: 0 % (ref 0.0–0.2)

## 2020-06-02 LAB — COMPREHENSIVE METABOLIC PANEL
ALT: 80 U/L — ABNORMAL HIGH (ref 0–44)
AST: 126 U/L — ABNORMAL HIGH (ref 15–41)
Albumin: 2.5 g/dL — ABNORMAL LOW (ref 3.5–5.0)
Alkaline Phosphatase: 184 U/L — ABNORMAL HIGH (ref 38–126)
Anion gap: 11 (ref 5–15)
BUN: 32 mg/dL — ABNORMAL HIGH (ref 8–23)
CO2: 21 mmol/L — ABNORMAL LOW (ref 22–32)
Calcium: 8.6 mg/dL — ABNORMAL LOW (ref 8.9–10.3)
Chloride: 108 mmol/L (ref 98–111)
Creatinine, Ser: 0.64 mg/dL (ref 0.44–1.00)
GFR calc Af Amer: >60 mL/min (ref >60)
GFR calc non Af Amer: >60 mL/min (ref >60)
Glucose, Bld: 117 mg/dL — ABNORMAL HIGH (ref 70–99)
Potassium: 3.6 mmol/L (ref 3.5–5.1)
Sodium: 140 mmol/L (ref 135–145)
Total Bilirubin: 6.9 mg/dL — ABNORMAL HIGH (ref 0.3–1.2)
Total Protein: 5.4 g/dL — ABNORMAL LOW (ref 6.5–8.1)

## 2020-06-02 LAB — URINE CULTURE

## 2020-06-02 MED ORDER — SODIUM CHLORIDE 0.9 % IV SOLN: INTRAVENOUS | Status: DC

## 2020-06-02 NOTE — Progress Notes (Signed)
Patient ID: [name], female   DOB: Dec 18, 1926, 86 y.o.   MRN: [id number]  PROGRESS NOTE    [name]  [id number] DOB: May 16, 1926 DOA: 06/01/2020 PCP: Robyne Peers, MD   Brief Narrative:  86 year old female with history of dementia, nonobstructive CAD, history of TIA presented from assisted living facility on 6-21 with worsening mental status.  Patient was apparently recently diagnosed with UTI/cystitis on 05/31/2020 and was prescribed Cipro but was unable to take this due to worsening nausea and mental status changes.  On presentation to the ED, CTA chest was unremarkable.  CT of the abdomen showed no obvious bowel obstruction however there was a concern for possible wall thickening of the mid transverse colon as well as biliary dilatation on CT up to 1.8 cm in diameter without clear obstruction.  She was found to have elevated bilirubin, AST and ALT.  GI was consulted.  Patient was started on IV fluids and antibiotics.  Assessment & Plan:   Sepsis: Present on admission Acute cystitis Infectious/inflammatory colitis Concern for cholangitis -Patient presented with altered mental status with CT of the abdomen showing concern for possible wall thickening of the mid transverse colon as well as biliary dilatation up to 1.8 cm in diameter without clear obstruction with elevated LFTs including bilirubin -Currently on broad-spectrum antibiotics.  Follow cultures.  Resume normal saline at 75 cc an hour.  DC vancomycin.  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  Spoke to daughter at length at bedside and recommended that hospice would probably be the best option for her.  Daughter wants to speak to GI first before making the decision but is leaning towards [facility] palliative care/hospice. -We will involve care management for the same -Overall prognosis is very poor.  Consult  palliative care.  Acute metabolic encephalopathy in a patient with baseline dementia -Probably from sepsis.  Mental status has much improved and is probably back to her baseline mental status as per the daughter at bedside. -Follow cultures.  Delirium precautions.  Leukocytosis -Monitor  Acute kidney injury -Baseline creatinine 0.4-0.5 -Improved.  IV fluids as above  History of TIA -Resume Plavix once no plan for any intervention is determined after GI evaluation    DVT prophylaxis: Lovenox Code Status: DNR  family Communication: Daughter at bedside Disposition Plan: Status is: Inpatient  Remains inpatient appropriate because:IV treatments appropriate due to intensity of illness or inability to take PO.  Will need continued IV antibiotics for sepsis from UTI/colitis/?  Cholangitis.  Will need input from GI regarding new diagnosis of probable metastatic pancreatic cancer.  Might benefit from hospice.   Dispo: The patient is from: ALF              Anticipated d/c is to: To be determined              Anticipated d/c date is: 3 days              Patient currently is not medically stable to d/c.   Consultants: GI.  Consult palliative care  Procedures: None Antimicrobials:  Vancomycin/cefepime and Flagyl from 06/01/2020 onwards  Subjective: Patient seen and examined at bedside.  Daughter also present at bedside.  Patient is awake and answering some questions, slightly confused to time.  Daughter states that her mother is probably back to her baseline mental status.  No overnight fever or vomiting reported.  Patient denies any abdominal pain  or worsening shortness of breath.  Objective: Vitals:   06/02/20 0151 06/02/20 0300 06/02/20 0400 06/02/20 0505  BP: (!) 112/58   (!) 110/58  Pulse: 75   75  Resp: 16   20  Temp: 97.7 F (36.5 C)   98.2 F (36.8 C)  TempSrc: Oral   Oral  SpO2: 95% 95% 93% 92%  Weight:      Height:        Intake/Output Summary (Last 24 hours) at  06/02/2020 1111 Last data filed at 06/01/2020 1444 Gross per 24 hour  Intake 199.28 ml  Output --  Net 199.28 ml   Filed Weights   06/01/20 0911 06/01/20 2100  Weight: 59 kg 62.6 kg    Examination:  General exam: Appears calm and comfortable.  Alert female lying in bed.  Awake, slightly confused to time.  No distress.  Poor historian. Respiratory system: Bilateral decreased breath sounds at bases Cardiovascular system: S1 & S2 heard, Rate controlled Gastrointestinal system: Abdomen is nondistended, soft and nontender. Normal bowel sounds heard. Extremities: No cyanosis, clubbing, edema  Central nervous system: Awake, slightly confused to time.  No focal neurological deficits. Moving extremities Skin: No rashes, lesions or ulcers Psychiatry: Could not be assessed because of mental status   Data Reviewed: I have personally reviewed following labs and imaging studies  CBC: Recent Labs  Lab 06/01/20 0750 06/01/20 1216 06/02/20 0312  WBC 25.4* 16.8* 18.9*  NEUTROABS 21.3*  --   --   HGB 13.0 10.9* 10.3*  HCT 37.8 32.4* 30.7*  MCV 92.0 92.0 91.9  PLT 396 353 [id number]   Basic Metabolic Panel: Recent Labs  Lab 06/01/20 0750 06/01/20 1216 06/02/20 0312  NA 137  --  140  K 3.1*  --  3.6  CL 96*  --  108  CO2 26  --  21*  GLUCOSE 134*  --  117*  BUN 17  --  32*  CREATININE 0.90 0.73 0.64  CALCIUM 9.3  --  8.6*   GFR: Estimated Creatinine Clearance: 37.8 mL/min (by C-G formula based on SCr of 0.64 mg/dL). Liver Function Tests: Recent Labs  Lab 06/01/20 0750 06/02/20 0312  AST 149* 126*  ALT 100* 80*  ALKPHOS 280* 184*  BILITOT 9.3* 6.9*  PROT 6.3* 5.4*  ALBUMIN 2.9* 2.5*   Recent Labs  Lab 06/01/20 0750  LIPASE 108*   Recent Labs  Lab 06/01/20 0750  AMMONIA 23   Coagulation Profile: Recent Labs  Lab 06/01/20 0750  INR 1.0   Cardiac Enzymes: No results for input(s): CKTOTAL, CKMB, CKMBINDEX, TROPONINI in the last 168 hours. BNP (last 3 results) No  results for input(s): PROBNP in the last 8760 hours. HbA1C: No results for input(s): HGBA1C in the last 72 hours. CBG: No results for input(s): GLUCAP in the last 168 hours. Lipid Profile: No results for input(s): CHOL, HDL, LDLCALC, TRIG, CHOLHDL, LDLDIRECT in the last 72 hours. Thyroid Function Tests: No results for input(s): TSH, T4TOTAL, FREET4, T3FREE, THYROIDAB in the last 72 hours. Anemia Panel: No results for input(s): VITAMINB12, FOLATE, FERRITIN, TIBC, IRON, RETICCTPCT in the last 72 hours. Sepsis Labs: Recent Labs  Lab 06/01/20 0750 06/01/20 0957  LATICACIDVEN 2.1* 1.8    Recent Results (from the past 240 hour(s))  Blood Culture (routine x 2)     Status: None (Preliminary result)   Collection Time: 06/01/20  7:48 AM   Specimen: BLOOD  Result Value Ref Range Status   Specimen Description   Final  BLOOD RIGHT ANTECUBITAL Performed at [facility] [address] [place], [place] [address]    Special Requests   Final    BOTTLES DRAWN AEROBIC AND ANAEROBIC Blood Culture adequate volume Performed at [facility] [address], [place], [place] [address]    Culture   Final    NO GROWTH 1 DAY Performed at [facility], [place] [address] [place], [place] [address]    Report Status PENDING  Incomplete  Blood Culture (routine x 2)     Status: None (Preliminary result)   Collection Time: 06/01/20  7:48 AM   Specimen: BLOOD  Result Value Ref Range Status   Specimen Description   Final    BLOOD LEFT ANTECUBITAL Performed at [facility] [address], [place], [place] [address]    Special Requests   Final    BOTTLES DRAWN AEROBIC AND ANAEROBIC Blood Culture adequate volume Performed at [facility] [address], [place], [place] [address]    Culture   Final    NO GROWTH 1 DAY Performed at [facility], [place] [address] [place], [place] [address]    Report Status PENDING   Incomplete  SARS Coronavirus 2 by RT PCR (hospital order, performed in [facility] hospital lab) Nasopharyngeal Nasopharyngeal Swab     Status: None   Collection Time: 06/01/20  7:55 AM   Specimen: Nasopharyngeal Swab  Result Value Ref Range Status   SARS Coronavirus 2 NEGATIVE NEGATIVE Final    Comment: (NOTE) SARS-CoV-2 target nucleic acids are NOT DETECTED. The SARS-CoV-2 RNA is generally detectable in upper and lower respiratory specimens during the acute phase of infection. The lowest concentration of SARS-CoV-2 viral copies this assay can detect is 250 copies / mL. A negative result does not preclude SARS-CoV-2 infection and should not be used as the sole basis for treatment or other patient management decisions.  A negative result may occur with improper specimen collection / handling, submission of specimen other than nasopharyngeal swab, presence of viral mutation(s) within the areas targeted by this assay, and inadequate number of viral copies (<250 copies / mL). A negative result must be combined with clinical observations, patient history, and epidemiological information. Fact Sheet for Patients:   StrictlyIdeas.no Fact Sheet for Healthcare Providers: BankingDealers.co.za This test is not yet approved or cleared  by the [place] FDA and has been authorized for detection and/or diagnosis of SARS-CoV-2 by FDA under an Emergency Use Authorization (EUA).  This EUA will remain in effect (meaning this test can be used) for the duration of the COVID-19 declaration under Section 564(b)(1) of the Act, 21 U.S.C. section 360bbb-3(b)(1), unless the authorization is terminated or revoked sooner. Performed at [facility], [place] [address], [place], [place] [address]          Radiology Studies: CT Angio Chest PE W and/or Wo Contrast  Result Date: 06/01/2020 CLINICAL DATA:  Abdominal pain, shortness of breath.   Jaundice EXAM: CT ANGIOGRAPHY CHEST CT ABDOMEN AND PELVIS WITH CONTRAST TECHNIQUE: Multidetector CT imaging of the chest was performed using the standard protocol during bolus administration of intravenous contrast. Multiplanar CT image reconstructions and MIPs were obtained to evaluate the vascular anatomy. Multidetector CT imaging of the abdomen and pelvis was performed using the standard protocol during bolus administration of intravenous contrast. CONTRAST:  154mL OMNIPAQUE IOHEXOL 350 MG/ML SOLN COMPARISON:  CT 01/11/2020, 06/14/2014 FINDINGS: CTA CHEST FINDINGS Cardiovascular: Satisfactory opacification of the  pulmonary arteries. No filling defect to the segmental branch level to suggest pulmonary embolism. The main pulmonary trunk is dilated measuring 3.9 cm in diameter. Mild cardiomegaly. Thoracic aortic aneurysm measuring 4.3 cm in diameter, stable from 2015. Atherosclerotic calcification of the aorta and coronary arteries. Mediastinum/Nodes: No enlarged mediastinal, hilar, or axillary lymph nodes. Thyroid gland, trachea, and esophagus demonstrate no significant findings. Small hiatal hernia. Lungs/Pleura: Mild central bronchovascular crowding. Similar degree of bronchiectasis within the right middle lobe which may reflect sequela of chronic infection. Right basilar atelectasis. No focal airspace consolidation, pleural effusion, or pneumothorax. Musculoskeletal: Mild superior endplate compression deformity of T8 with patchy sclerosis within the vertebral body and anterior fracture lucency likely a subacute fracture (series 6, images 76-80 no bony retropulsion. Chronic Schmorl's nodes at the superior endplate of T9 and [id number]. Review of the MIP images confirms the above findings. CT ABDOMEN and PELVIS FINDINGS Hepatobiliary: There is new moderate intra and extrahepatic biliary dilatation. Common bile duct measures up to 1.8 cm in diameter with abrupt tapering at the level of the ampulla (series 7, image 40). No  definite intraluminal stone. No well-defined mass or other obstructing lesion. New 1.2 cm indeterminate density lesion within the anterior left hepatic lobe (series 4, image 19). Additional rounded 1.1 cm lesion within the right hepatic lobe measuring at the upper limits of fluid density (series 4, image 23), also new from prior. Gallbladder is moderately distended. No hyperdense gallstone. Pancreas: Borderline prominence of the pancreatic duct, increased from prior. Mild parenchymal atrophy. No definite pancreatic mass. No peripancreatic inflammatory changes. Spleen: Normal in size without focal abnormality. Adrenals/Urinary Tract: Unremarkable adrenal glands. Probable small bilateral renal cysts. No stone or hydronephrosis. Urinary bladder unremarkable. Stomach/Bowel: Long segment diffuse wall thickening involving the ascending colon through the mid transverse colon. No pericolonic inflammatory changes. No diverticuli. Appendix not definitively identified. No pericecal inflammatory changes. No small bowel dilatation. Small hiatal hernia. Stomach appears otherwise unremarkable. Vascular/Lymphatic: Aortoiliac atherosclerosis without aneurysm. No abdominopelvic lymphadenopathy. Reproductive: Status post hysterectomy. No adnexal masses. Other: No free air, free fluid, or intra-abdominal fluid collection. Tiny fat containing umbilical hernia. Musculoskeletal: Chronic compression deformities of L1 and L3 without interval progression from CT 01/11/2020. Unchanged grade 1 anterolisthesis L5 on S1. Review of the MIP images confirms the above findings. IMPRESSION: 1. Negative for acute pulmonary embolism. 2. New moderate intra- and extrahepatic biliary dilatation. Common bile duct measures up to 1.8 cm in diameter with abrupt tapering at the level of the ampulla. No well-defined stone, mass, or other obstructing lesion. Further evaluation with MRCP is recommended. 3. Mild superior endplate compression deformity of T8 with  patchy sclerosis within the vertebral body and anterior fracture lucency suggesting a subacute fracture. 4. New indeterminate density lesions within the liver, measuring up to 1.2 cm. These can be further characterized on previously recommended MRI. 5. Long segment diffuse wall thickening involving the ascending colon through the mid transverse colon, which may reflect infectious or inflammatory colitis. 6. Stable ascending thoracic aortic aneurysm measuring up to 4.3 cm in diameter. 7. Dilated main pulmonary trunk suggesting pulmonary arterial hypertension. 8. Aortic and coronary artery atherosclerosis. (ICD10-I70.0). Electronically Signed   By: Davina Poke D.O.   On: 06/01/2020 10:14   CT ABDOMEN PELVIS W CONTRAST  Result Date: 06/01/2020 CLINICAL DATA:  Abdominal pain, shortness of breath.  Jaundice EXAM: CT ANGIOGRAPHY CHEST CT ABDOMEN AND PELVIS WITH CONTRAST TECHNIQUE: Multidetector CT imaging of the chest was performed using the standard protocol during bolus administration of  intravenous contrast. Multiplanar CT image reconstructions and MIPs were obtained to evaluate the vascular anatomy. Multidetector CT imaging of the abdomen and pelvis was performed using the standard protocol during bolus administration of intravenous contrast. CONTRAST:  152mL OMNIPAQUE IOHEXOL 350 MG/ML SOLN COMPARISON:  CT 01/11/2020, 06/14/2014 FINDINGS: CTA CHEST FINDINGS Cardiovascular: Satisfactory opacification of the pulmonary arteries. No filling defect to the segmental branch level to suggest pulmonary embolism. The main pulmonary trunk is dilated measuring 3.9 cm in diameter. Mild cardiomegaly. Thoracic aortic aneurysm measuring 4.3 cm in diameter, stable from 2015. Atherosclerotic calcification of the aorta and coronary arteries. Mediastinum/Nodes: No enlarged mediastinal, hilar, or axillary lymph nodes. Thyroid gland, trachea, and esophagus demonstrate no significant findings. Small hiatal hernia. Lungs/Pleura:  Mild central bronchovascular crowding. Similar degree of bronchiectasis within the right middle lobe which may reflect sequela of chronic infection. Right basilar atelectasis. No focal airspace consolidation, pleural effusion, or pneumothorax. Musculoskeletal: Mild superior endplate compression deformity of T8 with patchy sclerosis within the vertebral body and anterior fracture lucency likely a subacute fracture (series 6, images 76-80 no bony retropulsion. Chronic Schmorl's nodes at the superior endplate of T9 and QA348G. Review of the MIP images confirms the above findings. CT ABDOMEN and PELVIS FINDINGS Hepatobiliary: There is new moderate intra and extrahepatic biliary dilatation. Common bile duct measures up to 1.8 cm in diameter with abrupt tapering at the level of the ampulla (series 7, image 40). No definite intraluminal stone. No well-defined mass or other obstructing lesion. New 1.2 cm indeterminate density lesion within the anterior left hepatic lobe (series 4, image 19). Additional rounded 1.1 cm lesion within the right hepatic lobe measuring at the upper limits of fluid density (series 4, image 23), also new from prior. Gallbladder is moderately distended. No hyperdense gallstone. Pancreas: Borderline prominence of the pancreatic duct, increased from prior. Mild parenchymal atrophy. No definite pancreatic mass. No peripancreatic inflammatory changes. Spleen: Normal in size without focal abnormality. Adrenals/Urinary Tract: Unremarkable adrenal glands. Probable small bilateral renal cysts. No stone or hydronephrosis. Urinary bladder unremarkable. Stomach/Bowel: Long segment diffuse wall thickening involving the ascending colon through the mid transverse colon. No pericolonic inflammatory changes. No diverticuli. Appendix not definitively identified. No pericecal inflammatory changes. No small bowel dilatation. Small hiatal hernia. Stomach appears otherwise unremarkable. Vascular/Lymphatic: Aortoiliac  atherosclerosis without aneurysm. No abdominopelvic lymphadenopathy. Reproductive: Status post hysterectomy. No adnexal masses. Other: No free air, free fluid, or intra-abdominal fluid collection. Tiny fat containing umbilical hernia. Musculoskeletal: Chronic compression deformities of L1 and L3 without interval progression from CT 01/11/2020. Unchanged grade 1 anterolisthesis L5 on S1. Review of the MIP images confirms the above findings. IMPRESSION: 1. Negative for acute pulmonary embolism. 2. New moderate intra- and extrahepatic biliary dilatation. Common bile duct measures up to 1.8 cm in diameter with abrupt tapering at the level of the ampulla. No well-defined stone, mass, or other obstructing lesion. Further evaluation with MRCP is recommended. 3. Mild superior endplate compression deformity of T8 with patchy sclerosis within the vertebral body and anterior fracture lucency suggesting a subacute fracture. 4. New indeterminate density lesions within the liver, measuring up to 1.2 cm. These can be further characterized on previously recommended MRI. 5. Long segment diffuse wall thickening involving the ascending colon through the mid transverse colon, which may reflect infectious or inflammatory colitis. 6. Stable ascending thoracic aortic aneurysm measuring up to 4.3 cm in diameter. 7. Dilated main pulmonary trunk suggesting pulmonary arterial hypertension. 8. Aortic and coronary artery atherosclerosis. (ICD10-I70.0). Electronically Signed   By:  Nicholas  Plundo D.O.   On: 06/01/2020 10:14   MR 3D Recon At Scanner  Result Date: 06/01/2020 CLINICAL DATA:  86 year old female with history of jaundice. Abdominal pain. Shortness of breath. EXAM: MRI ABDOMEN WITHOUT AND WITH CONTRAST (INCLUDING MRCP) TECHNIQUE: Multiplanar multisequence MR imaging of the abdomen was performed both before and after the administration of intravenous contrast. Heavily T2-weighted images of the biliary and pancreatic ducts were  obtained, and three-dimensional MRCP images were rendered by post processing. CONTRAST:  44mL GADAVIST GADOBUTROL 1 MMOL/ML IV SOLN COMPARISON:  No prior abdominal MRI. CT the abdomen and pelvis 06/01/2020. FINDINGS: Comment: Portions of today's examination are significantly limited by a large amount of patient respiratory motion. Lower chest: Trace right pleural effusion lying dependently. Cardiomegaly. Hepatobiliary: There are 3 small hepatic lesions which are mildly T1 hypointense, mildly T2 hyperintense, demonstrate diffusion restriction, and appear hypovascular on post gadolinium imaging, highly concerning for metastatic lesions. These lesions measure 1.4 cm in diameter in segment 2 (axial image 15 of series 9), 1.2 cm in diameter in between segments 6 and 7 (axial image 15 of series 9), and 8 mm in segment 7 (axial image 11 of series 9). MRCP images demonstrate moderate intra and extrahepatic biliary ductal dilatation. Common bile duct measures up to 1.6 cm distally, and abruptly tapers immediately before the ampulla. No filling defect in the common bile duct to suggest choledocholithiasis. Gallbladder is moderately distended. No filling defects in the gallbladder to suggest gallstones. No gallbladder wall thickening or pericholecystic fluid to clearly indicate an acute cholecystitis at this time. Pancreas: In the right-side of the head of the pancreas extending into the pancreaticoduodenal groove slightly cephalad from the head of the pancreas there is a mass-like area which is T1 isointense, predominantly T2 hypointense and demonstrates heterogeneous enhancement (axial image 57 of series 23 and coronal image 30 of series 27) estimated to measure approximately 2.7 x 2.5 x 3.0 cm. This appears to exert some mass effect upon the distal common bile duct and the main pancreatic duct adjacent to the ampulla. MRCP images demonstrate dilatation of the main pancreatic duct which measures up to 5 mm in diameter.  Several small T1 hypointense, T2 hyperintense, nonenhancing lesions are noted in the body of the pancreas, largest of which is located anteriorly measuring up to 9 mm (axial image 20 of series 9). No peripancreatic fluid collections or inflammatory changes. Spleen:  Unremarkable. Adrenals/Urinary Tract: T1 hypointense, T2 hyperintense, nonenhancing lesions in both kidneys are compatible with simple cysts, largest of which is in the upper pole of the left kidney measuring up to 1.3 cm in diameter. Urothelial thickening and hyperenhancement in the right renal pelvis and visualized portions of the proximal right ureter. No hydroureteronephrosis in the visualized portions of the abdomen. Bilateral adrenal glands are normal in appearance. Stomach/Bowel: Visualized portions are unremarkable. Vascular/Lymphatic: Aortic atherosclerosis. No aneurysm identified in the visualized abdominal vasculature. No lymphadenopathy noted in the visualized portions of the abdomen. Other: Small amount of T2 hyperintensity inferior to the right kidney which may reflect perinephric fluid. No significant volume of ascites noted in the visualized portions of the peritoneal cavity. Musculoskeletal: No aggressive appearing osseous lesions are noted in the visualized portions of the skeleton. IMPRESSION: 1. Mass-like area in the right lateral aspect of the head of the pancreas extending into the pancreaticoduodenal groove and slightly cephalad to the head of the pancreas, exerting mass effect upon the distal common bile duct and pancreatic duct. Mild pancreatic ductal dilatation and  moderate intra and extrahepatic biliary ductal dilatation noted at this time. Findings are concerning for probable primary pancreatic neoplasm. Further evaluation with endoscopy should be considered if clinically appropriate. 2. 3 suspicious hypovascular hepatic lesions highly concerning for metastatic lesions. 3. Urothelial thickening and hyperenhancement in the  right renal pelvis and proximal right ureter. Small amount of perinephric fluid. Correlation with urinalysis is recommended to exclude the possibility of upper urinary tract infection. 4. Aortic atherosclerosis. 5. Trace right pleural effusion lying dependently. Electronically Signed   By: Vinnie Langton M.D.   On: 06/01/2020 19:40   DG Chest Port 1 View  Result Date: 06/01/2020 CLINICAL DATA:  Hypoxia EXAM: PORTABLE CHEST 1 VIEW COMPARISON:  01/10/2020 FINDINGS: The heart size and mediastinal contours are within normal limits. Both lungs are clear. The visualized skeletal structures are unremarkable. IMPRESSION: No active disease. Electronically Signed   By: Kathreen Devoid   On: 06/01/2020 08:30   MR ABDOMEN MRCP W WO CONTAST  Result Date: 06/01/2020 CLINICAL DATA:  86 year old female with history of jaundice. Abdominal pain. Shortness of breath. EXAM: MRI ABDOMEN WITHOUT AND WITH CONTRAST (INCLUDING MRCP) TECHNIQUE: Multiplanar multisequence MR imaging of the abdomen was performed both before and after the administration of intravenous contrast. Heavily T2-weighted images of the biliary and pancreatic ducts were obtained, and three-dimensional MRCP images were rendered by post processing. CONTRAST:  81mL GADAVIST GADOBUTROL 1 MMOL/ML IV SOLN COMPARISON:  No prior abdominal MRI. CT the abdomen and pelvis 06/01/2020. FINDINGS: Comment: Portions of today's examination are significantly limited by a large amount of patient respiratory motion. Lower chest: Trace right pleural effusion lying dependently. Cardiomegaly. Hepatobiliary: There are 3 small hepatic lesions which are mildly T1 hypointense, mildly T2 hyperintense, demonstrate diffusion restriction, and appear hypovascular on post gadolinium imaging, highly concerning for metastatic lesions. These lesions measure 1.4 cm in diameter in segment 2 (axial image 15 of series 9), 1.2 cm in diameter in between segments 6 and 7 (axial image 15 of series 9), and 8 mm  in segment 7 (axial image 11 of series 9). MRCP images demonstrate moderate intra and extrahepatic biliary ductal dilatation. Common bile duct measures up to 1.6 cm distally, and abruptly tapers immediately before the ampulla. No filling defect in the common bile duct to suggest choledocholithiasis. Gallbladder is moderately distended. No filling defects in the gallbladder to suggest gallstones. No gallbladder wall thickening or pericholecystic fluid to clearly indicate an acute cholecystitis at this time. Pancreas: In the right-side of the head of the pancreas extending into the pancreaticoduodenal groove slightly cephalad from the head of the pancreas there is a mass-like area which is T1 isointense, predominantly T2 hypointense and demonstrates heterogeneous enhancement (axial image 57 of series 23 and coronal image 30 of series 27) estimated to measure approximately 2.7 x 2.5 x 3.0 cm. This appears to exert some mass effect upon the distal common bile duct and the main pancreatic duct adjacent to the ampulla. MRCP images demonstrate dilatation of the main pancreatic duct which measures up to 5 mm in diameter. Several small T1 hypointense, T2 hyperintense, nonenhancing lesions are noted in the body of the pancreas, largest of which is located anteriorly measuring up to 9 mm (axial image 20 of series 9). No peripancreatic fluid collections or inflammatory changes. Spleen:  Unremarkable. Adrenals/Urinary Tract: T1 hypointense, T2 hyperintense, nonenhancing lesions in both kidneys are compatible with simple cysts, largest of which is in the upper pole of the left kidney measuring up to 1.3 cm in diameter.  Urothelial thickening and hyperenhancement in the right renal pelvis and visualized portions of the proximal right ureter. No hydroureteronephrosis in the visualized portions of the abdomen. Bilateral adrenal glands are normal in appearance. Stomach/Bowel: Visualized portions are unremarkable. Vascular/Lymphatic:  Aortic atherosclerosis. No aneurysm identified in the visualized abdominal vasculature. No lymphadenopathy noted in the visualized portions of the abdomen. Other: Small amount of T2 hyperintensity inferior to the right kidney which may reflect perinephric fluid. No significant volume of ascites noted in the visualized portions of the peritoneal cavity. Musculoskeletal: No aggressive appearing osseous lesions are noted in the visualized portions of the skeleton. IMPRESSION: 1. Mass-like area in the right lateral aspect of the head of the pancreas extending into the pancreaticoduodenal groove and slightly cephalad to the head of the pancreas, exerting mass effect upon the distal common bile duct and pancreatic duct. Mild pancreatic ductal dilatation and moderate intra and extrahepatic biliary ductal dilatation noted at this time. Findings are concerning for probable primary pancreatic neoplasm. Further evaluation with endoscopy should be considered if clinically appropriate. 2. 3 suspicious hypovascular hepatic lesions highly concerning for metastatic lesions. 3. Urothelial thickening and hyperenhancement in the right renal pelvis and proximal right ureter. Small amount of perinephric fluid. Correlation with urinalysis is recommended to exclude the possibility of upper urinary tract infection. 4. Aortic atherosclerosis. 5. Trace right pleural effusion lying dependently. Electronically Signed   By: Vinnie Langton M.D.   On: 06/01/2020 19:40        Scheduled Meds: . enoxaparin (LOVENOX) injection  40 mg Subcutaneous Q24H   Continuous Infusions: . ceFEPime (MAXIPIME) IV 2 g (06/02/20 1107)  . metronidazole 500 mg (06/02/20 0506)  . vancomycin            Aline August, MD [facility] 06/02/2020, 11:11 AM

## 2020-06-02 NOTE — Progress Notes (Signed)
Clinically, the patient is much improved today.  Delirium resolved.  She is alert, and nonagitated.  She is very much like the patient I have been accustomed to seeing as an outpatient.  Exam: Alert, pleasant, seemingly coherent although formal mental status testing not performed.  No pallor, no deep jaundice.  Chest clear, heart normal, abdomen without tenderness.    The patient's MRI scan shows an apparent pancreatic mass with impingement on the CBD, leading to biliary ductal dilatation.  No evidence of intraductal stone.  Liver chemistries are trending downward, with bilirubin today of 6.9, on antibiotic therapy.  White count is improved, dropping from 25,000 to 19,000 over the past 24 hours, with an associated 3 g drop in hemoglobin to 10.3, probably dilutional.  Blood and urine cultures are pending.  Impression: Suspect biliary obstruction, possibly with low-grade cholangitis, related to probable pancreatic cancer.  Plan:  1.  Check CA 19-9 level  2.  Lengthy discussion with patient, daughter [name] at bedside (who is a Librarian, academic), and with Dr. Arta Silence, who can provide endoscopic ultrasound services.    [name] does most of the talking for the patient.  She indicates that she and her mother had already agreed the patient would not undergo treatment for pancreatic cancer, such as chemotherapy or surgery, taking into account the patient's very advanced age, and I think this is very reasonable.  Dr. Paulita Fujita does not feel that EUS is prudent unless a tissue diagnosis would lead to a change in therapy (for example, chemotherapy for pancreatic cancer if present).    Therefore, we have come to the conclusion the patient should not have a tissue biopsy, whether by EUS or by IR, but instead, we will focus on relieving the biliary obstruction via ERCP with stent placement.    The nature, purpose, and risks of the procedure (taking into account for roughly 2 to 3% risk of  pancreatitis, plus increased anesthesia risk given the patient's advanced age, as well as less likely complications such as perforation, bleeding, or infection) were discussed with the patient and her daughter and they are agreeable to proceed.  We hope to get the procedure scheduled for tomorrow if the endoscopy schedule permits it.  Cleotis Nipper, M.D. Pager [phone] If no answer or after 5 PM call [phone]

## 2020-06-02 NOTE — Progress Notes (Signed)
Unfortunately, there is not schedule availability for the patient to have an ERCP with stent placement tomorrow.  We will look into having it done either over the weekend or early next week.  In the meantime, I will try the patient on a low-fat diet, since she is hungry.  Cleotis Nipper, M.D. Pager [phone] If no answer or after 5 PM call [phone]

## 2020-06-02 NOTE — TOC Progression Note (Addendum)
Transition of Care [facility]) - Progression Note    Patient Details  Name: [name] MRN: [id number] Date of Birth: June 18, 1926  [facility] CM/SW Contact  Purcell Mouton, RN Phone Number: 06/02/2020, 1:20 PM  Clinical Narrative:    Pt is from [place]. Spoke with pt's daughter [name] concerning discharge plans . She is asking for [facility] to follow pt for service. [name] states that [employer] took care of her father and did a wonderful job.  Referral given to in house rep May Ann to follow along with pt.    Expected Discharge Plan: Assisted Living Barriers to Discharge: No Barriers Identified  Expected Discharge Plan and Services Expected Discharge Plan: Assisted Living   Discharge Planning Services: CM Consult   Living arrangements for the past 2 months: Single Family Home                                       Social Determinants of Health (SDOH) Interventions    Readmission Risk Interventions No flowsheet data found.

## 2020-06-03 DIAGNOSIS — Z7189 Other specified counseling: Secondary | ICD-10-CM

## 2020-06-03 DIAGNOSIS — K831 Obstruction of bile duct: Secondary | ICD-10-CM

## 2020-06-03 DIAGNOSIS — Z515 Encounter for palliative care: Secondary | ICD-10-CM

## 2020-06-03 DIAGNOSIS — N390 Urinary tract infection, site not specified: Secondary | ICD-10-CM

## 2020-06-03 DIAGNOSIS — A419 Sepsis, unspecified organism: Principal | ICD-10-CM

## 2020-06-03 LAB — CBC
HCT: 30.8 % — ABNORMAL LOW (ref 36.0–46.0)
Hemoglobin: 10.4 g/dL — ABNORMAL LOW (ref 12.0–15.0)
MCH: 31.3 pg (ref 26.0–34.0)
MCHC: 33.8 g/dL (ref 30.0–36.0)
MCV: 92.8 fL (ref 80.0–100.0)
Platelets: 286 10*3/uL (ref 150–400)
RBC: 3.32 MIL/uL — ABNORMAL LOW (ref 3.87–5.11)
RDW: 18.2 % — ABNORMAL HIGH (ref 11.5–15.5)
WBC: 12.7 10*3/uL — ABNORMAL HIGH (ref 4.0–10.5)
nRBC: 0 % (ref 0.0–0.2)

## 2020-06-03 LAB — COMPREHENSIVE METABOLIC PANEL
ALT: 78 U/L — ABNORMAL HIGH (ref 0–44)
AST: 116 U/L — ABNORMAL HIGH (ref 15–41)
Albumin: 2.2 g/dL — ABNORMAL LOW (ref 3.5–5.0)
Alkaline Phosphatase: 192 U/L — ABNORMAL HIGH (ref 38–126)
Anion gap: 9 (ref 5–15)
BUN: 28 mg/dL — ABNORMAL HIGH (ref 8–23)
CO2: 21 mmol/L — ABNORMAL LOW (ref 22–32)
Calcium: 8.4 mg/dL — ABNORMAL LOW (ref 8.9–10.3)
Chloride: 111 mmol/L (ref 98–111)
Creatinine, Ser: 0.59 mg/dL (ref 0.44–1.00)
GFR calc Af Amer: >60 mL/min (ref >60)
GFR calc non Af Amer: >60 mL/min (ref >60)
Glucose, Bld: 118 mg/dL — ABNORMAL HIGH (ref 70–99)
Potassium: 3.1 mmol/L — ABNORMAL LOW (ref 3.5–5.1)
Sodium: 141 mmol/L (ref 135–145)
Total Bilirubin: 6.8 mg/dL — ABNORMAL HIGH (ref 0.3–1.2)
Total Protein: 5.3 g/dL — ABNORMAL LOW (ref 6.5–8.1)

## 2020-06-03 MED ORDER — IPRATROPIUM-ALBUTEROL 0.5-2.5 (3) MG/3ML IN SOLN
3.0000 mL | Freq: Two times a day (BID) | RESPIRATORY_TRACT | Status: DC
  Filled 2020-06-03: qty 3

## 2020-06-03 MED ORDER — POTASSIUM CHLORIDE CRYS ER 20 MEQ PO TBCR
40.0000 meq | EXTENDED_RELEASE_TABLET | ORAL | Status: AC
  Administered 2020-06-03 (×2): 40 meq via ORAL
  Filled 2020-06-03 (×2): qty 2

## 2020-06-03 MED ORDER — ALBUTEROL SULFATE (2.5 MG/3ML) 0.083% IN NEBU
INHALATION_SOLUTION | RESPIRATORY_TRACT | Status: AC
  Administered 2020-06-03: 2.5 mg
  Filled 2020-06-03: qty 3

## 2020-06-03 MED ORDER — POTASSIUM CHLORIDE 10 MEQ/100ML IV SOLN
10.0000 meq | INTRAVENOUS | Status: AC
  Administered 2020-06-03 (×2): 10 meq via INTRAVENOUS
  Filled 2020-06-03 (×2): qty 100

## 2020-06-03 MED ORDER — AMLODIPINE BESYLATE 5 MG PO TABS
5.0000 mg | ORAL_TABLET | Freq: Every day | ORAL | Status: DC
  Administered 2020-06-03 – 2020-06-05 (×3): 5 mg via ORAL
  Filled 2020-06-03 (×3): qty 1

## 2020-06-03 MED ORDER — LORAZEPAM 0.5 MG PO TABS: 0.5000 mg | ORAL_TABLET | Freq: Two times a day (BID) | ORAL | Status: DC | PRN

## 2020-06-03 MED ORDER — MEMANTINE HCL 10 MG PO TABS
10.0000 mg | ORAL_TABLET | Freq: Two times a day (BID) | ORAL | Status: DC
  Administered 2020-06-03 – 2020-06-07 (×9): 10 mg via ORAL
  Filled 2020-06-03 (×9): qty 1

## 2020-06-03 MED ORDER — ALBUTEROL SULFATE (2.5 MG/3ML) 0.083% IN NEBU
2.5000 mg | INHALATION_SOLUTION | RESPIRATORY_TRACT | Status: DC | PRN
  Administered 2020-06-04 (×3): 2.5 mg via RESPIRATORY_TRACT
  Filled 2020-06-03 (×3): qty 3

## 2020-06-03 MED ORDER — DONEPEZIL HCL 10 MG PO TABS
10.0000 mg | ORAL_TABLET | Freq: Every day | ORAL | Status: DC
  Administered 2020-06-03 – 2020-06-06 (×4): 10 mg via ORAL
  Filled 2020-06-03 (×4): qty 1

## 2020-06-03 MED ORDER — PANTOPRAZOLE SODIUM 40 MG PO TBEC
40.0000 mg | DELAYED_RELEASE_TABLET | Freq: Every day | ORAL | Status: DC
  Administered 2020-06-03 – 2020-06-07 (×5): 40 mg via ORAL
  Filled 2020-06-03 (×5): qty 1

## 2020-06-03 NOTE — Care Management Important Message (Signed)
Important Message  Patient Details  Name: [name] MRN: [id number] Date of Birth: 07-12-1926   Medicare Important Message Given:      Document has been given to [place], NCM  to give to the patient.   Crista Luria 06/03/2020, 11:01 AM

## 2020-06-03 NOTE — Progress Notes (Signed)
Patient ID: [name], female   DOB: 12/02/1926, 86 y.o.   MRN: [id number]  PROGRESS NOTE    [name]  OEV:[id number] DOB: 07-07-1926 DOA: 06/01/2020 PCP: Robyne Peers, MD   Brief Narrative:  86 year old female with history of dementia, nonobstructive CAD, history of TIA presented from assisted living facility on 6-21 with worsening mental status.  Patient was apparently recently diagnosed with UTI/cystitis on 05/31/2020 and was prescribed Cipro but was unable to take this due to worsening nausea and mental status changes.  On presentation to the ED, CTA chest was unremarkable.  CT of the abdomen showed no obvious bowel obstruction however there was a concern for possible wall thickening of the mid transverse colon as well as biliary dilatation on CT up to 1.8 cm in diameter without clear obstruction.  She was found to have elevated bilirubin, AST and ALT.  GI was consulted.  Patient was started on IV fluids and antibiotics.  Assessment & Plan:   Sepsis: Present on admission Acute cystitis Infectious/inflammatory colitis Concern for cholangitis -Patient presented with altered mental status with CT of the abdomen showing concern for possible wall thickening of the mid transverse colon as well as biliary dilatation up to 1.8 cm in diameter without clear obstruction with elevated LFTs including bilirubin -Currently on cefepime and Flagyl.  Vancomycin discontinued on 06/01/2020.  Blood cultures negative so far.  Urine culture growing multiple species.  Decrease normal saline to 50 cc an hour.  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  Spoke to daughter at length at bedside on 06/02/2020 and on 06/03/2020 as well and recommended that hospice would probably be the best option for her.  Palliative care has been consulted: Follow recommendations. -Overall prognosis is very poor.   -GI is  planning for palliative biliary stent at some point.  Acute metabolic encephalopathy in a patient with baseline dementia -Probably from sepsis.  Mental status has much improved and is probably back to her baseline mental status as per the daughter at bedside. -Follow cultures.  Delirium precautions.  Leukocytosis -Monitor  Acute kidney injury -Baseline creatinine 0.4-0.5 -Improved.  IV fluids as above  History of TIA -Resume Plavix once no plan for any intervention is determined after GI evaluation  Hypokalemia -Replace.  Repeat a.m. labs  DVT prophylaxis: Lovenox Code Status: DNR  family Communication: Daughter at bedside on 06/03/2020 Disposition Plan: Status is: Inpatient  Remains inpatient appropriate because:IV treatments appropriate due to intensity of illness or inability to take PO.  Currently requiring IV fluids and antibiotics.  GI is planning for palliative biliary stent.  Palliative care evaluation pending.  May benefit from hospice placement.  Dispo: The patient is from: ALF              Anticipated d/c is to: To be determined              Anticipated d/c date is: 3 days              Patient currently is not medically stable to d/c.   Consultants: GI. palliative care  Procedures: None Antimicrobials:  Vancomycin/cefepime and Flagyl from 06/01/2020 onwards  Subjective: Patient seen and examined at bedside.  Daughter also present at bedside.  Patient is still slightly confused to time.  No overnight fever, vomiting, worsening abdominal pain reported. Objective: Vitals:   06/02/20 0505 06/02/20 1236 06/02/20 2142 06/03/20 0510  BP: Marland Kitchen)  110/58 125/64 129/62 (!) 150/73  Pulse: 75 80 78 77  Resp: 20  (!) 21 16  Temp: 98.2 F (36.8 C) 98.6 F (37 C) 99.3 F (37.4 C) 98 F (36.7 C)  TempSrc: Oral Oral Oral Oral  SpO2: 92% 93% (!) 89% 92%  Weight:      Height:        Intake/Output Summary (Last 24 hours) at 06/03/2020 0802 Last data filed at 06/02/2020  1534 Gross per 24 hour  Intake 470 ml  Output --  Net 470 ml   Filed Weights   06/01/20 0911 06/01/20 2100  Weight: 59 kg 62.6 kg    Examination:  General exam: No acute distress.  Alert female lying in bed.  Awake, slightly confused to time. Poor historian. Respiratory system: Bilateral decreased breath sounds at bases with some scattered crackles.  Slightly intermittently tachypneic Cardiovascular system: Rate controlled, S1-S2 heard Gastrointestinal system: Abdomen is nondistended, soft and nontender.  Bowel sounds are heard  extremities: Trace lower extremity edema present.  No clubbing Central nervous system: Awake, poor historian, still slightly confused to time.  No focal neurological deficits. Moving extremities Skin: No obvious ulcers or rashes Psychiatry: Cannot assess properly because of mental status   Data Reviewed: I have personally reviewed following labs and imaging studies  CBC: Recent Labs  Lab 06/01/20 0750 06/01/20 1216 06/02/20 0312 06/03/20 0320  WBC 25.4* 16.8* 18.9* 12.7*  NEUTROABS 21.3*  --   --   --   HGB 13.0 10.9* 10.3* 10.4*  HCT 37.8 32.4* 30.7* 30.8*  MCV 92.0 92.0 91.9 92.8  PLT 396 353 275 270   Basic Metabolic Panel: Recent Labs  Lab 06/01/20 0750 06/01/20 1216 06/02/20 0312 06/03/20 0320  NA 137  --  140 141  K 3.1*  --  3.6 3.1*  CL 96*  --  108 111  CO2 26  --  21* 21*  GLUCOSE 134*  --  117* 118*  BUN 17  --  32* 28*  CREATININE 0.90 0.73 0.64 0.59  CALCIUM 9.3  --  8.6* 8.4*   GFR: Estimated Creatinine Clearance: 37.8 mL/min (by C-G formula based on SCr of 0.59 mg/dL). Liver Function Tests: Recent Labs  Lab 06/01/20 0750 06/02/20 0312 06/03/20 0320  AST 149* 126* 116*  ALT 100* 80* 78*  ALKPHOS 280* 184* 192*  BILITOT 9.3* 6.9* 6.8*  PROT 6.3* 5.4* 5.3*  ALBUMIN 2.9* 2.5* 2.2*   Recent Labs  Lab 06/01/20 0750  LIPASE 108*   Recent Labs  Lab 06/01/20 0750  AMMONIA 23   Coagulation Profile: Recent  Labs  Lab 06/01/20 0750  INR 1.0   Cardiac Enzymes: No results for input(s): CKTOTAL, CKMB, CKMBINDEX, TROPONINI in the last 168 hours. BNP (last 3 results) No results for input(s): PROBNP in the last 8760 hours. HbA1C: No results for input(s): HGBA1C in the last 72 hours. CBG: No results for input(s): GLUCAP in the last 168 hours. Lipid Profile: No results for input(s): CHOL, HDL, LDLCALC, TRIG, CHOLHDL, LDLDIRECT in the last 72 hours. Thyroid Function Tests: No results for input(s): TSH, T4TOTAL, FREET4, T3FREE, THYROIDAB in the last 72 hours. Anemia Panel: No results for input(s): VITAMINB12, FOLATE, FERRITIN, TIBC, IRON, RETICCTPCT in the last 72 hours. Sepsis Labs: Recent Labs  Lab 06/01/20 0750 06/01/20 0957  LATICACIDVEN 2.1* 1.8    Recent Results (from the past 240 hour(s))  Blood Culture (routine x 2)     Status: None (Preliminary result)   Collection  Time: 06/01/20  7:48 AM   Specimen: BLOOD  Result Value Ref Range Status   Specimen Description   Final    BLOOD RIGHT ANTECUBITAL Performed at [facility] [address], [place], [place] [address]    Special Requests   Final    BOTTLES DRAWN AEROBIC AND ANAEROBIC Blood Culture adequate volume Performed at [facility] [address], [place], [place] [address]    Culture   Final    NO GROWTH 1 DAY Performed at [facility], [place] [employer] [address], [place], [place] [address]    Report Status PENDING  Incomplete  Blood Culture (routine x 2)     Status: None (Preliminary result)   Collection Time: 06/01/20  7:48 AM   Specimen: BLOOD  Result Value Ref Range Status   Specimen Description   Final    BLOOD LEFT ANTECUBITAL Performed at [facility] [address] [place], [place] [address]    Special Requests   Final    BOTTLES DRAWN AEROBIC AND ANAEROBIC Blood Culture adequate volume Performed at [facility] [address],  [place], [place] [address]    Culture   Final    NO GROWTH 1 DAY Performed at [facility], [place] [address] [place], [place] [address]    Report Status PENDING  Incomplete  SARS Coronavirus 2 by RT PCR (hospital order, performed in [facility] hospital lab) Nasopharyngeal Nasopharyngeal Swab     Status: None   Collection Time: 06/01/20  7:55 AM   Specimen: Nasopharyngeal Swab  Result Value Ref Range Status   SARS Coronavirus 2 NEGATIVE NEGATIVE Final    Comment: (NOTE) SARS-CoV-2 target nucleic acids are NOT DETECTED. The SARS-CoV-2 RNA is generally detectable in upper and lower respiratory specimens during the acute phase of infection. The lowest concentration of SARS-CoV-2 viral copies this assay can detect is 250 copies / mL. A negative result does not preclude SARS-CoV-2 infection and should not be used as the sole basis for treatment or other patient management decisions.  A negative result may occur with improper specimen collection / handling, submission of specimen other than nasopharyngeal swab, presence of viral mutation(s) within the areas targeted by this assay, and inadequate number of viral copies (<250 copies / mL). A negative result must be combined with clinical observations, patient history, and epidemiological information. Fact Sheet for Patients:   StrictlyIdeas.no Fact Sheet for Healthcare Providers: BankingDealers.co.za This test is not yet approved or cleared  by the [place] FDA and has been authorized for detection and/or diagnosis of SARS-CoV-2 by FDA under an Emergency Use Authorization (EUA).  This EUA will remain in effect (meaning this test can be used) for the duration of the COVID-19 declaration under Section 564(b)(1) of the Act, 21 U.S.C. section 360bbb-3(b)(1), unless the authorization is terminated or revoked sooner. Performed at [facility], [place] [address] [place], [place] [address]   Urine culture     Status: Abnormal   Collection Time: 06/01/20 10:48 AM   Specimen: In/Out Cath Urine  Result Value Ref Range Status   Specimen Description   Final    IN/OUT CATH URINE Performed at [facility] [address] [place], [place] [address]    Special Requests   Final    NONE Performed at [facility], [place] [address], [place],  [address]    Culture MULTIPLE SPECIES PRESENT, SUGGEST RECOLLECTION (A)  Final  Report Status 06/02/2020 FINAL  Final         Radiology Studies: CT Angio Chest PE W and/or Wo Contrast  Result Date: 06/01/2020 CLINICAL DATA:  Abdominal pain, shortness of breath.  Jaundice EXAM: CT ANGIOGRAPHY CHEST CT ABDOMEN AND PELVIS WITH CONTRAST TECHNIQUE: Multidetector CT imaging of the chest was performed using the standard protocol during bolus administration of intravenous contrast. Multiplanar CT image reconstructions and MIPs were obtained to evaluate the vascular anatomy. Multidetector CT imaging of the abdomen and pelvis was performed using the standard protocol during bolus administration of intravenous contrast. CONTRAST:  134mL OMNIPAQUE IOHEXOL 350 MG/ML SOLN COMPARISON:  CT 01/11/2020, 06/14/2014 FINDINGS: CTA CHEST FINDINGS Cardiovascular: Satisfactory opacification of the pulmonary arteries. No filling defect to the segmental branch level to suggest pulmonary embolism. The main pulmonary trunk is dilated measuring 3.9 cm in diameter. Mild cardiomegaly. Thoracic aortic aneurysm measuring 4.3 cm in diameter, stable from 2015. Atherosclerotic calcification of the aorta and coronary arteries. Mediastinum/Nodes: No enlarged mediastinal, hilar, or axillary lymph nodes. Thyroid gland, trachea, and esophagus demonstrate no significant findings. Small hiatal hernia. Lungs/Pleura: Mild central bronchovascular crowding. Similar degree of bronchiectasis within the right middle lobe which  may reflect sequela of chronic infection. Right basilar atelectasis. No focal airspace consolidation, pleural effusion, or pneumothorax. Musculoskeletal: Mild superior endplate compression deformity of T8 with patchy sclerosis within the vertebral body and anterior fracture lucency likely a subacute fracture (series 6, images 76-80 no bony retropulsion. Chronic Schmorl's nodes at the superior endplate of T9 and Q59. Review of the MIP images confirms the above findings. CT ABDOMEN and PELVIS FINDINGS Hepatobiliary: There is new moderate intra and extrahepatic biliary dilatation. Common bile duct measures up to 1.8 cm in diameter with abrupt tapering at the level of the ampulla (series 7, image 40). No definite intraluminal stone. No well-defined mass or other obstructing lesion. New 1.2 cm indeterminate density lesion within the anterior left hepatic lobe (series 4, image 19). Additional rounded 1.1 cm lesion within the right hepatic lobe measuring at the upper limits of fluid density (series 4, image 23), also new from prior. Gallbladder is moderately distended. No hyperdense gallstone. Pancreas: Borderline prominence of the pancreatic duct, increased from prior. Mild parenchymal atrophy. No definite pancreatic mass. No peripancreatic inflammatory changes. Spleen: Normal in size without focal abnormality. Adrenals/Urinary Tract: Unremarkable adrenal glands. Probable small bilateral renal cysts. No stone or hydronephrosis. Urinary bladder unremarkable. Stomach/Bowel: Long segment diffuse wall thickening involving the ascending colon through the mid transverse colon. No pericolonic inflammatory changes. No diverticuli. Appendix not definitively identified. No pericecal inflammatory changes. No small bowel dilatation. Small hiatal hernia. Stomach appears otherwise unremarkable. Vascular/Lymphatic: Aortoiliac atherosclerosis without aneurysm. No abdominopelvic lymphadenopathy. Reproductive: Status post hysterectomy. No  adnexal masses. Other: No free air, free fluid, or intra-abdominal fluid collection. Tiny fat containing umbilical hernia. Musculoskeletal: Chronic compression deformities of L1 and L3 without interval progression from CT 01/11/2020. Unchanged grade 1 anterolisthesis L5 on S1. Review of the MIP images confirms the above findings. IMPRESSION: 1. Negative for acute pulmonary embolism. 2. New moderate intra- and extrahepatic biliary dilatation. Common bile duct measures up to 1.8 cm in diameter with abrupt tapering at the level of the ampulla. No well-defined stone, mass, or other obstructing lesion. Further evaluation with MRCP is recommended. 3. Mild superior endplate compression deformity of T8 with patchy sclerosis within the vertebral body and anterior fracture lucency suggesting a subacute fracture. 4. New indeterminate density lesions within the liver, measuring up to 1.2 cm.  These can be further characterized on previously recommended MRI. 5. Long segment diffuse wall thickening involving the ascending colon through the mid transverse colon, which may reflect infectious or inflammatory colitis. 6. Stable ascending thoracic aortic aneurysm measuring up to 4.3 cm in diameter. 7. Dilated main pulmonary trunk suggesting pulmonary arterial hypertension. 8. Aortic and coronary artery atherosclerosis. (ICD10-I70.0). Electronically Signed   By: Davina Poke D.O.   On: 06/01/2020 10:14   CT ABDOMEN PELVIS W CONTRAST  Result Date: 06/01/2020 CLINICAL DATA:  Abdominal pain, shortness of breath.  Jaundice EXAM: CT ANGIOGRAPHY CHEST CT ABDOMEN AND PELVIS WITH CONTRAST TECHNIQUE: Multidetector CT imaging of the chest was performed using the standard protocol during bolus administration of intravenous contrast. Multiplanar CT image reconstructions and MIPs were obtained to evaluate the vascular anatomy. Multidetector CT imaging of the abdomen and pelvis was performed using the standard protocol during bolus  administration of intravenous contrast. CONTRAST:  176mL OMNIPAQUE IOHEXOL 350 MG/ML SOLN COMPARISON:  CT 01/11/2020, 06/14/2014 FINDINGS: CTA CHEST FINDINGS Cardiovascular: Satisfactory opacification of the pulmonary arteries. No filling defect to the segmental branch level to suggest pulmonary embolism. The main pulmonary trunk is dilated measuring 3.9 cm in diameter. Mild cardiomegaly. Thoracic aortic aneurysm measuring 4.3 cm in diameter, stable from 2015. Atherosclerotic calcification of the aorta and coronary arteries. Mediastinum/Nodes: No enlarged mediastinal, hilar, or axillary lymph nodes. Thyroid gland, trachea, and esophagus demonstrate no significant findings. Small hiatal hernia. Lungs/Pleura: Mild central bronchovascular crowding. Similar degree of bronchiectasis within the right middle lobe which may reflect sequela of chronic infection. Right basilar atelectasis. No focal airspace consolidation, pleural effusion, or pneumothorax. Musculoskeletal: Mild superior endplate compression deformity of T8 with patchy sclerosis within the vertebral body and anterior fracture lucency likely a subacute fracture (series 6, images 76-80 no bony retropulsion. Chronic Schmorl's nodes at the superior endplate of T9 and H29. Review of the MIP images confirms the above findings. CT ABDOMEN and PELVIS FINDINGS Hepatobiliary: There is new moderate intra and extrahepatic biliary dilatation. Common bile duct measures up to 1.8 cm in diameter with abrupt tapering at the level of the ampulla (series 7, image 40). No definite intraluminal stone. No well-defined mass or other obstructing lesion. New 1.2 cm indeterminate density lesion within the anterior left hepatic lobe (series 4, image 19). Additional rounded 1.1 cm lesion within the right hepatic lobe measuring at the upper limits of fluid density (series 4, image 23), also new from prior. Gallbladder is moderately distended. No hyperdense gallstone. Pancreas: Borderline  prominence of the pancreatic duct, increased from prior. Mild parenchymal atrophy. No definite pancreatic mass. No peripancreatic inflammatory changes. Spleen: Normal in size without focal abnormality. Adrenals/Urinary Tract: Unremarkable adrenal glands. Probable small bilateral renal cysts. No stone or hydronephrosis. Urinary bladder unremarkable. Stomach/Bowel: Long segment diffuse wall thickening involving the ascending colon through the mid transverse colon. No pericolonic inflammatory changes. No diverticuli. Appendix not definitively identified. No pericecal inflammatory changes. No small bowel dilatation. Small hiatal hernia. Stomach appears otherwise unremarkable. Vascular/Lymphatic: Aortoiliac atherosclerosis without aneurysm. No abdominopelvic lymphadenopathy. Reproductive: Status post hysterectomy. No adnexal masses. Other: No free air, free fluid, or intra-abdominal fluid collection. Tiny fat containing umbilical hernia. Musculoskeletal: Chronic compression deformities of L1 and L3 without interval progression from CT 01/11/2020. Unchanged grade 1 anterolisthesis L5 on S1. Review of the MIP images confirms the above findings. IMPRESSION: 1. Negative for acute pulmonary embolism. 2. New moderate intra- and extrahepatic biliary dilatation. Common bile duct measures up to 1.8 cm in diameter with abrupt  tapering at the level of the ampulla. No well-defined stone, mass, or other obstructing lesion. Further evaluation with MRCP is recommended. 3. Mild superior endplate compression deformity of T8 with patchy sclerosis within the vertebral body and anterior fracture lucency suggesting a subacute fracture. 4. New indeterminate density lesions within the liver, measuring up to 1.2 cm. These can be further characterized on previously recommended MRI. 5. Long segment diffuse wall thickening involving the ascending colon through the mid transverse colon, which may reflect infectious or inflammatory colitis. 6.  Stable ascending thoracic aortic aneurysm measuring up to 4.3 cm in diameter. 7. Dilated main pulmonary trunk suggesting pulmonary arterial hypertension. 8. Aortic and coronary artery atherosclerosis. (ICD10-I70.0). Electronically Signed   By: Davina Poke D.O.   On: 06/01/2020 10:14   MR 3D Recon At Scanner  Result Date: 06/01/2020 CLINICAL DATA:  86 year old female with history of jaundice. Abdominal pain. Shortness of breath. EXAM: MRI ABDOMEN WITHOUT AND WITH CONTRAST (INCLUDING MRCP) TECHNIQUE: Multiplanar multisequence MR imaging of the abdomen was performed both before and after the administration of intravenous contrast. Heavily T2-weighted images of the biliary and pancreatic ducts were obtained, and three-dimensional MRCP images were rendered by post processing. CONTRAST:  50mL GADAVIST GADOBUTROL 1 MMOL/ML IV SOLN COMPARISON:  No prior abdominal MRI. CT the abdomen and pelvis 06/01/2020. FINDINGS: Comment: Portions of today's examination are significantly limited by a large amount of patient respiratory motion. Lower chest: Trace right pleural effusion lying dependently. Cardiomegaly. Hepatobiliary: There are 3 small hepatic lesions which are mildly T1 hypointense, mildly T2 hyperintense, demonstrate diffusion restriction, and appear hypovascular on post gadolinium imaging, highly concerning for metastatic lesions. These lesions measure 1.4 cm in diameter in segment 2 (axial image 15 of series 9), 1.2 cm in diameter in between segments 6 and 7 (axial image 15 of series 9), and 8 mm in segment 7 (axial image 11 of series 9). MRCP images demonstrate moderate intra and extrahepatic biliary ductal dilatation. Common bile duct measures up to 1.6 cm distally, and abruptly tapers immediately before the ampulla. No filling defect in the common bile duct to suggest choledocholithiasis. Gallbladder is moderately distended. No filling defects in the gallbladder to suggest gallstones. No gallbladder wall  thickening or pericholecystic fluid to clearly indicate an acute cholecystitis at this time. Pancreas: In the right-side of the head of the pancreas extending into the pancreaticoduodenal groove slightly cephalad from the head of the pancreas there is a mass-like area which is T1 isointense, predominantly T2 hypointense and demonstrates heterogeneous enhancement (axial image 57 of series 23 and coronal image 30 of series 27) estimated to measure approximately 2.7 x 2.5 x 3.0 cm. This appears to exert some mass effect upon the distal common bile duct and the main pancreatic duct adjacent to the ampulla. MRCP images demonstrate dilatation of the main pancreatic duct which measures up to 5 mm in diameter. Several small T1 hypointense, T2 hyperintense, nonenhancing lesions are noted in the body of the pancreas, largest of which is located anteriorly measuring up to 9 mm (axial image 20 of series 9). No peripancreatic fluid collections or inflammatory changes. Spleen:  Unremarkable. Adrenals/Urinary Tract: T1 hypointense, T2 hyperintense, nonenhancing lesions in both kidneys are compatible with simple cysts, largest of which is in the upper pole of the left kidney measuring up to 1.3 cm in diameter. Urothelial thickening and hyperenhancement in the right renal pelvis and visualized portions of the proximal right ureter. No hydroureteronephrosis in the visualized portions of the abdomen. Bilateral adrenal  glands are normal in appearance. Stomach/Bowel: Visualized portions are unremarkable. Vascular/Lymphatic: Aortic atherosclerosis. No aneurysm identified in the visualized abdominal vasculature. No lymphadenopathy noted in the visualized portions of the abdomen. Other: Small amount of T2 hyperintensity inferior to the right kidney which may reflect perinephric fluid. No significant volume of ascites noted in the visualized portions of the peritoneal cavity. Musculoskeletal: No aggressive appearing osseous lesions are  noted in the visualized portions of the skeleton. IMPRESSION: 1. Mass-like area in the right lateral aspect of the head of the pancreas extending into the pancreaticoduodenal groove and slightly cephalad to the head of the pancreas, exerting mass effect upon the distal common bile duct and pancreatic duct. Mild pancreatic ductal dilatation and moderate intra and extrahepatic biliary ductal dilatation noted at this time. Findings are concerning for probable primary pancreatic neoplasm. Further evaluation with endoscopy should be considered if clinically appropriate. 2. 3 suspicious hypovascular hepatic lesions highly concerning for metastatic lesions. 3. Urothelial thickening and hyperenhancement in the right renal pelvis and proximal right ureter. Small amount of perinephric fluid. Correlation with urinalysis is recommended to exclude the possibility of upper urinary tract infection. 4. Aortic atherosclerosis. 5. Trace right pleural effusion lying dependently. Electronically Signed   By: Vinnie Langton M.D.   On: 06/01/2020 19:40   DG Chest Port 1 View  Result Date: 06/01/2020 CLINICAL DATA:  Hypoxia EXAM: PORTABLE CHEST 1 VIEW COMPARISON:  01/10/2020 FINDINGS: The heart size and mediastinal contours are within normal limits. Both lungs are clear. The visualized skeletal structures are unremarkable. IMPRESSION: No active disease. Electronically Signed   By: Kathreen Devoid   On: 06/01/2020 08:30   MR ABDOMEN MRCP W WO CONTAST  Result Date: 06/01/2020 CLINICAL DATA:  86 year old female with history of jaundice. Abdominal pain. Shortness of breath. EXAM: MRI ABDOMEN WITHOUT AND WITH CONTRAST (INCLUDING MRCP) TECHNIQUE: Multiplanar multisequence MR imaging of the abdomen was performed both before and after the administration of intravenous contrast. Heavily T2-weighted images of the biliary and pancreatic ducts were obtained, and three-dimensional MRCP images were rendered by post processing. CONTRAST:  35mL  GADAVIST GADOBUTROL 1 MMOL/ML IV SOLN COMPARISON:  No prior abdominal MRI. CT the abdomen and pelvis 06/01/2020. FINDINGS: Comment: Portions of today's examination are significantly limited by a large amount of patient respiratory motion. Lower chest: Trace right pleural effusion lying dependently. Cardiomegaly. Hepatobiliary: There are 3 small hepatic lesions which are mildly T1 hypointense, mildly T2 hyperintense, demonstrate diffusion restriction, and appear hypovascular on post gadolinium imaging, highly concerning for metastatic lesions. These lesions measure 1.4 cm in diameter in segment 2 (axial image 15 of series 9), 1.2 cm in diameter in between segments 6 and 7 (axial image 15 of series 9), and 8 mm in segment 7 (axial image 11 of series 9). MRCP images demonstrate moderate intra and extrahepatic biliary ductal dilatation. Common bile duct measures up to 1.6 cm distally, and abruptly tapers immediately before the ampulla. No filling defect in the common bile duct to suggest choledocholithiasis. Gallbladder is moderately distended. No filling defects in the gallbladder to suggest gallstones. No gallbladder wall thickening or pericholecystic fluid to clearly indicate an acute cholecystitis at this time. Pancreas: In the right-side of the head of the pancreas extending into the pancreaticoduodenal groove slightly cephalad from the head of the pancreas there is a mass-like area which is T1 isointense, predominantly T2 hypointense and demonstrates heterogeneous enhancement (axial image 57 of series 23 and coronal image 30 of series 27) estimated to measure approximately 2.7  x 2.5 x 3.0 cm. This appears to exert some mass effect upon the distal common bile duct and the main pancreatic duct adjacent to the ampulla. MRCP images demonstrate dilatation of the main pancreatic duct which measures up to 5 mm in diameter. Several small T1 hypointense, T2 hyperintense, nonenhancing lesions are noted in the body of the  pancreas, largest of which is located anteriorly measuring up to 9 mm (axial image 20 of series 9). No peripancreatic fluid collections or inflammatory changes. Spleen:  Unremarkable. Adrenals/Urinary Tract: T1 hypointense, T2 hyperintense, nonenhancing lesions in both kidneys are compatible with simple cysts, largest of which is in the upper pole of the left kidney measuring up to 1.3 cm in diameter. Urothelial thickening and hyperenhancement in the right renal pelvis and visualized portions of the proximal right ureter. No hydroureteronephrosis in the visualized portions of the abdomen. Bilateral adrenal glands are normal in appearance. Stomach/Bowel: Visualized portions are unremarkable. Vascular/Lymphatic: Aortic atherosclerosis. No aneurysm identified in the visualized abdominal vasculature. No lymphadenopathy noted in the visualized portions of the abdomen. Other: Small amount of T2 hyperintensity inferior to the right kidney which may reflect perinephric fluid. No significant volume of ascites noted in the visualized portions of the peritoneal cavity. Musculoskeletal: No aggressive appearing osseous lesions are noted in the visualized portions of the skeleton. IMPRESSION: 1. Mass-like area in the right lateral aspect of the head of the pancreas extending into the pancreaticoduodenal groove and slightly cephalad to the head of the pancreas, exerting mass effect upon the distal common bile duct and pancreatic duct. Mild pancreatic ductal dilatation and moderate intra and extrahepatic biliary ductal dilatation noted at this time. Findings are concerning for probable primary pancreatic neoplasm. Further evaluation with endoscopy should be considered if clinically appropriate. 2. 3 suspicious hypovascular hepatic lesions highly concerning for metastatic lesions. 3. Urothelial thickening and hyperenhancement in the right renal pelvis and proximal right ureter. Small amount of perinephric fluid. Correlation with  urinalysis is recommended to exclude the possibility of upper urinary tract infection. 4. Aortic atherosclerosis. 5. Trace right pleural effusion lying dependently. Electronically Signed   By: Vinnie Langton M.D.   On: 06/01/2020 19:40        Scheduled Meds: . enoxaparin (LOVENOX) injection  40 mg Subcutaneous Q24H   Continuous Infusions: . sodium chloride 75 mL/hr at 06/03/20 0530  . ceFEPime (MAXIPIME) IV 2 g (06/02/20 2221)  . metronidazole 500 mg (06/03/20 0531)          Aline August, MD [facility] 06/03/2020, 8:02 AM

## 2020-06-03 NOTE — Progress Notes (Signed)
PT Cancellation Note  Patient Details Name: [name] MRN: [id number] DOB: 12-31-1926   Cancelled Treatment:    Reason Eval/Treat Not Completed: Fatigue/lethargy limiting ability to participate. Per RN, pt fatigued from activity and request we hold eval for later today/tomorrow. Will continue to follow acutely.    Talbot Grumbling PT, DPT 06/03/20, 2:55 PM

## 2020-06-03 NOTE — Progress Notes (Signed)
Hydrologist [facility]) Hospital Liaison: RN note     Notified by [facility] [place], RN of patient/family request for [facility] services at [place] after discharge. Chart and patient information under review by [facility] physician. Hospice eligibility pending currently.     Writer spoke with daughter, Apolonio Schneiders  to initiate education related to hospice philosophy, services and team approach to care. [name] verbalized understanding of information given.      Please send signed and completed DNR form home with patient/family. Patient will need prescriptions for discharge comfort medications.      DME needs have been discussed, patient currently has the following equipment in the home:       .  Patient/family requests the following DME for delivery to the home: none.  [facility] [place] [facility] aware of the above. Please notify [facility] when patient is ready to leave the unit at discharge. (Call [phone] or [phone] after 5pm.) [facility] information and contact numbers given to [name].       Please call with any hospice related questions.      Thank you for this referral.      Farrel Gordon, RN, [facility] (listed on [place] under [place])   [phone]

## 2020-06-03 NOTE — Progress Notes (Addendum)
Patient is doing well.  She feels hungry and is eating well, and in fact, was disappointed in receiving a "heart healthy" diet because it didn't have what she wants to eat!  Patient is afebrile and vital signs are stable.  She is alert, and oriented to place, but not year.  Labs show further improvement, with white count dropping from 19,000 to 13,000.  Liver chemistries, on the other hand, have more or less plateaued with a bilirubin of 6.8, alk phos 192, and transaminases in the 100 range.  PLAN:  I spoke with Dr. Laurence Ferrari of interventional radiology, who feels that the pancreatic lesion is not amenable to percutaneous biopsy, although the liver lesions probably would be able to be biopsied.  However, because of their probable necrotic character, the diagnostic yield might be somewhat reduced. I discussed this with the patient's daughter at the bedside, [place], and although her brother had initially wanted tissue confirmation of what is going on, after the above discussion with the radiologist,, and further discussion between her and her brother, they are agreeable to no biopsy.    However, they are in favor of proceeding with ERCP and stent placement, which has been scheduled for 7:30 AM on Monday.  In my opinion, this is appropriate for palliation of symptoms such as pruritus and the potential for cholangitis with what would likely become progressive biliary obstruction, since the patient is by no means "end stage" with respect to her condition.  As noted from my note 2 days ago, I have reviewed the risks of that procedure with the patient and her daughter.  Time for this visit, including care coordination and repeated discussion with the family as well as the radiologist, was approximately 1 hour.  Cleotis Nipper, M.D. Pager [phone] If no answer or after 5 PM call [phone]

## 2020-06-04 ENCOUNTER — Inpatient Hospital Stay (HOSPITAL_COMMUNITY): Payer: PPO

## 2020-06-04 LAB — CBC WITH DIFFERENTIAL/PLATELET
Abs Immature Granulocytes: 0.15 10*3/uL — ABNORMAL HIGH (ref 0.00–0.07)
Basophils Absolute: 0.1 10*3/uL (ref 0.0–0.1)
Basophils Relative: 1 %
Eosinophils Absolute: 0.2 10*3/uL (ref 0.0–0.5)
Eosinophils Relative: 2 %
HCT: 30.3 % — ABNORMAL LOW (ref 36.0–46.0)
Hemoglobin: 10.3 g/dL — ABNORMAL LOW (ref 12.0–15.0)
Immature Granulocytes: 1 %
Lymphocytes Relative: 8 %
Lymphs Abs: 0.9 10*3/uL (ref 0.7–4.0)
MCH: 31.1 pg (ref 26.0–34.0)
MCHC: 34 g/dL (ref 30.0–36.0)
MCV: 91.5 fL (ref 80.0–100.0)
Monocytes Absolute: 1.4 10*3/uL — ABNORMAL HIGH (ref 0.1–1.0)
Monocytes Relative: 11 %
Neutro Abs: 9.6 10*3/uL — ABNORMAL HIGH (ref 1.7–7.7)
Neutrophils Relative %: 77 %
Platelets: 299 10*3/uL (ref 150–400)
RBC: 3.31 MIL/uL — ABNORMAL LOW (ref 3.87–5.11)
RDW: 18.4 % — ABNORMAL HIGH (ref 11.5–15.5)
WBC: 12.4 10*3/uL — ABNORMAL HIGH (ref 4.0–10.5)
nRBC: 0 % (ref 0.0–0.2)

## 2020-06-04 LAB — COMPREHENSIVE METABOLIC PANEL
ALT: 75 U/L — ABNORMAL HIGH (ref 0–44)
AST: 91 U/L — ABNORMAL HIGH (ref 15–41)
Albumin: 2.1 g/dL — ABNORMAL LOW (ref 3.5–5.0)
Alkaline Phosphatase: 190 U/L — ABNORMAL HIGH (ref 38–126)
Anion gap: 6 (ref 5–15)
BUN: 21 mg/dL (ref 8–23)
CO2: 21 mmol/L — ABNORMAL LOW (ref 22–32)
Calcium: 8.3 mg/dL — ABNORMAL LOW (ref 8.9–10.3)
Chloride: 113 mmol/L — ABNORMAL HIGH (ref 98–111)
Creatinine, Ser: 0.45 mg/dL (ref 0.44–1.00)
GFR calc Af Amer: >60 mL/min (ref >60)
GFR calc non Af Amer: >60 mL/min (ref >60)
Glucose, Bld: 135 mg/dL — ABNORMAL HIGH (ref 70–99)
Potassium: 3.2 mmol/L — ABNORMAL LOW (ref 3.5–5.1)
Sodium: 140 mmol/L (ref 135–145)
Total Bilirubin: 7.4 mg/dL — ABNORMAL HIGH (ref 0.3–1.2)
Total Protein: 5.4 g/dL — ABNORMAL LOW (ref 6.5–8.1)

## 2020-06-04 LAB — MAGNESIUM: Magnesium: 2.1 mg/dL (ref 1.7–2.4)

## 2020-06-04 MED ORDER — IPRATROPIUM-ALBUTEROL 0.5-2.5 (3) MG/3ML IN SOLN
3.0000 mL | Freq: Four times a day (QID) | RESPIRATORY_TRACT | Status: DC
  Administered 2020-06-05 (×4): 3 mL via RESPIRATORY_TRACT
  Filled 2020-06-04 (×4): qty 3

## 2020-06-04 MED ORDER — POTASSIUM CHLORIDE CRYS ER 20 MEQ PO TBCR
40.0000 meq | EXTENDED_RELEASE_TABLET | ORAL | Status: AC
  Administered 2020-06-04 (×2): 40 meq via ORAL
  Filled 2020-06-04 (×2): qty 2

## 2020-06-04 MED ORDER — ARTIFICIAL TEARS OPHTHALMIC OINT
TOPICAL_OINTMENT | Freq: Two times a day (BID) | OPHTHALMIC | Status: DC
  Administered 2020-06-06: 1 via OPHTHALMIC
  Filled 2020-06-04: qty 3.5

## 2020-06-04 MED ORDER — HYDROCORTISONE (PERIANAL) 2.5 % EX CREA
1.0000 "application " | TOPICAL_CREAM | Freq: Two times a day (BID) | CUTANEOUS | Status: DC | PRN
  Filled 2020-06-04 (×2): qty 28.35

## 2020-06-04 MED ORDER — POLYETHYL GLYCOL-PROPYL GLYCOL 0.4-0.3 % OP GEL: Freq: Two times a day (BID) | OPHTHALMIC | Status: DC

## 2020-06-04 NOTE — Progress Notes (Signed)
PT Cancellation Note  Patient Details Name: [name] MRN: [id number] DOB: December 22, 1926   Cancelled Treatment:    Reason Eval/Treat Not Completed: Fatigue/lethargy limiting ability to participate. Pt politely declined PT due to fatigue.  Spoke with nursing who states she sat up for about an hour earlier in the AM and this was tiring for her. She did state that the pt does want to keep her independence as long as she can and was a +1 assist to the recliner.  Will check back as schedule permits.   Galen Manila 06/04/2020, 12:17 PM

## 2020-06-04 NOTE — Progress Notes (Signed)
Patient ID: [name], female   DOB: [birth year], 86 y.o.   MRN: [id number]  PROGRESS NOTE    JYLL TOMARO  JEH:[id number] DOB: 04-Sep-1926 DOA: 06/01/2020 PCP: Robyne Peers, MD   Brief Narrative:  86 year old female with history of dementia, nonobstructive CAD, history of TIA presented from assisted living facility on 6-21 with worsening mental status.  Patient was apparently recently diagnosed with UTI/cystitis on 05/31/2020 and was prescribed Cipro but was unable to take this due to worsening nausea and mental status changes.  On presentation to the ED, CTA chest was unremarkable.  CT of the abdomen showed no obvious bowel obstruction however there was a concern for possible wall thickening of the mid transverse colon as well as biliary dilatation on CT up to 1.8 cm in diameter without clear obstruction.  She was found to have elevated bilirubin, AST and ALT.  GI was consulted.  Patient was started on IV fluids and antibiotics.  Assessment & Plan:   Sepsis: Present on admission Acute cystitis Infectious/inflammatory colitis Concern for cholangitis -Patient presented with altered mental status with CT of the abdomen showing concern for possible wall thickening of the mid transverse colon as well as biliary dilatation up to 1.8 cm in diameter without clear obstruction with elevated LFTs including bilirubin -Currently on cefepime and Flagyl.  Vancomycin discontinued on 06/01/2020.  Blood cultures negative so far.  Urine culture growing multiple species.  IV fluids discontinued. -Currently afebrile with stable blood pressure.  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  Spoke to daughter at length at bedside on 06/02/2020 and on 06/03/2020 as well and recommended that hospice would probably be the best option for her.  Palliative care has been consulted: Follow recommendations.  Patient will be  followed up by hospice as an outpatient. -Overall prognosis is very poor.   -GI is planning for palliative biliary stent probably on Monday.  Bilirubin worsening.  Acute metabolic encephalopathy in a patient with baseline dementia -Probably from sepsis.  Mental status has much improved and is probably back to her baseline mental status as per the daughter at bedside. -Follow cultures.  Delirium precautions.  Leukocytosis -Monitor  Acute kidney injury -Baseline creatinine 0.4-0.5 -Improved.  IV fluids as above  History of TIA -Resume Plavix once no plan for any intervention is determined after GI evaluation  Hypokalemia -Replace.  Repeat a.m. labs  DVT prophylaxis: Lovenox Code Status: DNR  family Communication: Daughter at bedside on 06/03/2020 Disposition Plan: Status is: Inpatient  Remains inpatient appropriate because:IV treatments appropriate due to intensity of illness or inability to take PO.  Currently requiring antibiotics.  GI is planning for palliative biliary stent.  Palliative care evaluation pending.    Dispo: The patient is from: ALF              Anticipated d/c is to: ALF with hospice              Anticipated d/c date is: 3 days              Patient currently is not medically stable to d/c.   Consultants: GI. palliative care  Procedures: None Antimicrobials:  Vancomycin/cefepime and Flagyl from 06/01/2020 onwards  Subjective: Patient seen and examined at bedside.  Daughter also present at bedside.  Patient is still slightly confused to time.  Denies worsening abdominal pain.  No overnight fever or vomiting reported.   Objective: Vitals:  06/03/20 1708 06/03/20 2031 06/04/20 0402 06/04/20 0512  BP:  (!) 144/71  133/64  Pulse:  79  70  Resp:  (!) 23  16  Temp:  98.6 F (37 C)  98.3 F (36.8 C)  TempSrc:  Oral  Oral  SpO2: 94% 92% 95% 93%  Weight:      Height:        Intake/Output Summary (Last 24 hours) at 06/04/2020 0809 Last data filed at 06/04/2020  0542 Gross per 24 hour  Intake 240 ml  Output 600 ml  Net -360 ml   Filed Weights   06/01/20 0911 06/01/20 2100  Weight: 59 kg 62.6 kg    Examination:  General exam: No distress.  Alert female lying in bed.  Awake, slightly confused to time. Poor historian. Respiratory system: Bilateral decreased breath sounds at bases with some crackles and mild wheezing.  Slight tachypnea intermittently.   Cardiovascular system: S1-S2 heard, rate controlled Gastrointestinal system: Abdomen is nondistended, soft and nontender.  Normal bowel sounds heard  extremities: No clubbing or cyanosis.  Mild bilateral lower extremity edema present.   Central nervous system: Awake, poor historian, still slightly confused to time.  No focal neurological deficits.  Moves extremities  skin: No obvious petechiae/ecchymosis or ulcers Psychiatry: Could not be assessed properly because of mental status   Data Reviewed: I have personally reviewed following labs and imaging studies  CBC: Recent Labs  Lab 06/01/20 0750 06/01/20 1216 06/02/20 0312 06/03/20 0320 06/04/20 0353  WBC 25.4* 16.8* 18.9* 12.7* 12.4*  NEUTROABS 21.3*  --   --   --  9.6*  HGB 13.0 10.9* 10.3* 10.4* 10.3*  HCT 37.8 32.4* 30.7* 30.8* 30.3*  MCV 92.0 92.0 91.9 92.8 91.5  PLT 396 353 275 286 607   Basic Metabolic Panel: Recent Labs  Lab 06/01/20 0750 06/01/20 1216 06/02/20 0312 06/03/20 0320 06/04/20 0353  NA 137  --  140 141 140  K 3.1*  --  3.6 3.1* 3.2*  CL 96*  --  108 111 113*  CO2 26  --  21* 21* 21*  GLUCOSE 134*  --  117* 118* 135*  BUN 17  --  32* 28* 21  CREATININE 0.90 0.73 0.64 0.59 0.45  CALCIUM 9.3  --  8.6* 8.4* 8.3*  MG  --   --   --   --  2.1   GFR: Estimated Creatinine Clearance: 37.8 mL/min (by C-G formula based on SCr of 0.45 mg/dL). Liver Function Tests: Recent Labs  Lab 06/01/20 0750 06/02/20 0312 06/03/20 0320 06/04/20 0353  AST 149* 126* 116* 91*  ALT 100* 80* 78* 75*  ALKPHOS 280* 184* 192*  190*  BILITOT 9.3* 6.9* 6.8* 7.4*  PROT 6.3* 5.4* 5.3* 5.4*  ALBUMIN 2.9* 2.5* 2.2* 2.1*   Recent Labs  Lab 06/01/20 0750  LIPASE 108*   Recent Labs  Lab 06/01/20 0750  AMMONIA 23   Coagulation Profile: Recent Labs  Lab 06/01/20 0750  INR 1.0   Cardiac Enzymes: No results for input(s): CKTOTAL, CKMB, CKMBINDEX, TROPONINI in the last 168 hours. BNP (last 3 results) No results for input(s): PROBNP in the last 8760 hours. HbA1C: No results for input(s): HGBA1C in the last 72 hours. CBG: No results for input(s): GLUCAP in the last 168 hours. Lipid Profile: No results for input(s): CHOL, HDL, LDLCALC, TRIG, CHOLHDL, LDLDIRECT in the last 72 hours. Thyroid Function Tests: No results for input(s): TSH, T4TOTAL, FREET4, T3FREE, THYROIDAB in the last 72 hours. Anemia  Panel: No results for input(s): VITAMINB12, FOLATE, FERRITIN, TIBC, IRON, RETICCTPCT in the last 72 hours. Sepsis Labs: Recent Labs  Lab 06/01/20 0750 06/01/20 0957  LATICACIDVEN 2.1* 1.8    Recent Results (from the past 240 hour(s))  Blood Culture (routine x 2)     Status: None (Preliminary result)   Collection Time: 06/01/20  7:48 AM   Specimen: BLOOD  Result Value Ref Range Status   Specimen Description   Final    BLOOD RIGHT ANTECUBITAL Performed at [facility], [place] [address] [place], [place] [address]    Special Requests   Final    BOTTLES DRAWN AEROBIC AND ANAEROBIC Blood Culture adequate volume Performed at [place] [address], [place], [place] [address]    Culture   Final    NO GROWTH 3 DAYS Performed at [facility], [place] [address] [place], [place] [address]    Report Status PENDING  Incomplete  Blood Culture (routine x 2)     Status: None (Preliminary result)   Collection Time: 06/01/20  7:48 AM   Specimen: BLOOD  Result Value Ref Range Status   Specimen Description   Final    BLOOD LEFT ANTECUBITAL Performed at [place] [address], [place], [place] [address]    Special Requests   Final    BOTTLES DRAWN AEROBIC AND ANAEROBIC Blood Culture adequate volume Performed at [place] [address], [place], [place] [address]    Culture   Final    NO GROWTH 3 DAYS Performed at [facility], [place] [address] [place], [place] [address]    Report Status PENDING  Incomplete  SARS Coronavirus 2 by RT PCR (hospital order, performed in [facility] hospital lab) Nasopharyngeal Nasopharyngeal Swab     Status: None   Collection Time: 06/01/20  7:55 AM   Specimen: Nasopharyngeal Swab  Result Value Ref Range Status   SARS Coronavirus 2 NEGATIVE NEGATIVE Final    Comment: (NOTE) SARS-CoV-2 target nucleic acids are NOT DETECTED. The SARS-CoV-2 RNA is generally detectable in upper and lower respiratory specimens during the acute phase of infection. The lowest concentration of SARS-CoV-2 viral copies this assay can detect is 250 copies / mL. A negative result does not preclude SARS-CoV-2 infection and should not be used as the sole basis for treatment or other patient management decisions.  A negative result may occur with improper specimen collection / handling, submission of specimen other than nasopharyngeal swab, presence of viral mutation(s) within the areas targeted by this assay, and inadequate number of viral copies (<250 copies / mL). A negative result must be combined with clinical observations, patient history, and epidemiological information. Fact Sheet for Patients:   StrictlyIdeas.no Fact Sheet for Healthcare Providers: BankingDealers.co.za This test is not yet approved or cleared  by the [place] FDA and has been authorized for detection and/or diagnosis of SARS-CoV-2 by FDA under an Emergency Use Authorization (EUA).  This EUA will remain in effect (meaning this test can be used) for the duration of  the COVID-19 declaration under Section 564(b)(1) of the Act, 21 U.S.C. section 360bbb-3(b)(1), unless the authorization is terminated or revoked sooner. Performed at [facility], [place] [address] [place], [place] [address]   Urine culture     Status: Abnormal   Collection Time: 06/01/20 10:48 AM   Specimen: In/Out Cath Urine  Result Value Ref Range Status   Specimen Description  Final    IN/OUT CATH URINE Performed at [facility], [place] [address] [place], [place] [address]    Special Requests   Final    NONE Performed at [facility], [place] [address] [place], [place] [address]    Culture MULTIPLE SPECIES PRESENT, SUGGEST RECOLLECTION (A)  Final   Report Status 06/02/2020 FINAL  Final         Radiology Studies: No results found.      Scheduled Meds: . amLODipine  5 mg Oral Daily  . donepezil  10 mg Oral QHS  . enoxaparin (LOVENOX) injection  40 mg Subcutaneous Q24H  . memantine  10 mg Oral BID  . pantoprazole  40 mg Oral Daily  . potassium chloride  40 mEq Oral Q2H   Continuous Infusions: . ceFEPime (MAXIPIME) IV 2 g (06/03/20 2116)  . metronidazole 500 mg (06/04/20 0515)          Aline August, MD [facility] 06/04/2020, 8:09 AM

## 2020-06-04 NOTE — Consult Note (Signed)
Palliative care consult note  Reason for consult: Goals of care in light of new diagnosis of likely pancreatic head cancer with metastasis to liver  Palliative care consult received.  Chart reviewed including personal review of pertinent labs and imaging.  Briefly, [name] is a 86 year old female with past medical history of dementia, nonobstructive CAD, TIA who presented from assisted living facility on 6/21 with worsening mental status.  She was diagnosed with UTI/cystitis and moved prescribed Cipro but was unable to tolerate this.  She represented to the ED and CTA chest was unremarkable but CT of the abdomen showed concern for possible wall thickening of the mid transverse colon as well as biliary dilatation of up to 1.8 cm.  GI was consulted due to elevated bilirubin, AST and ALT.  She was started IV fluids and antibiotics.  Follow-up MRCP revealed pancreatic lesion. Overall, it appears that goals are transitioning toward focus on comfort and quality.  With this in mind, plan is for ERCP for stenting of biliary duct for palliative purposes.    I saw and examined [name] today.  She is awake, alert, and very pleasant in conversation.  Her daughter, [name], who is a PA in [employer] is at the bedside.  [name] has a very clear understanding of her mother's current status, prior comorbid conditions, prognosis, and options for care moving forward.   Values and goals of care important to patient and family were attempted to be elicited.  We discussed clinical course as well as wishes moving forward in regard to care plan this hospitalization (with plan for ERCP) and after discharge.  There has been talk of biopsy to confirm diagnosis, and family is still considering this option.  [name] tells me that she is at peace without having biopsy confirmation, but her brother has said that he wished he knew actual diagnosis.  Await input from IR who was asked to review case about feasibility of  biopsy.  We discussed difference between a aggressive medical intervention path and a palliative, comfort focused care path.  Family is familiar with hospice services through [facility] and wish to work with them.   Concept of Hospice and Palliative Care were discussed  Questions and concerns addressed.   PMT will continue to support holistically.  -DNR/DNI -Quality of life is most important to [name] and her family moving forward.  Plan is for ERCP for biliary stent placement for palliative purposes. -Await input from IR regarding biopsy.  I offered my opinion that I would not pursue biopsy if it is not going to change management as is another procedure Ms. Specht would need to endure. -Symptomatically, [name] reports that her symptoms are well controlled.  I provided my card and asked her to call if there are any needs this weekend as I would be happy to stop by to discuss any other symptom management needs or questions. -Family is familiar with [facility] hospice and like to work with them on discharge.  This referral is already been placed.  Total time: 40 minutes  Greater than 50%  of this time was spent counseling and coordinating care related to the above assessment and plan.  Micheline Rough, MD [place] Team [phone]

## 2020-06-04 NOTE — Progress Notes (Signed)
Palliative care progress note  I checked in briefly on [name] today.  She was sleeping and no family present at the bedside.  Plan IS for ERCP for stent placement on Monday at 7:30 AM.  Liaison from [place] to set up hospice services has also been in contact with family.  Please call if there are specific palliative care needs with which we can be assist in the care of [name] this weekend.  Otherwise, we will plan to check in with her and her daughter, [name], on Monday.  Micheline Rough, MD [facility] [phone]  NO CHARGE NOTE

## 2020-06-05 LAB — COMPREHENSIVE METABOLIC PANEL
ALT: 66 U/L — ABNORMAL HIGH (ref 0–44)
AST: 69 U/L — ABNORMAL HIGH (ref 15–41)
Albumin: 2.1 g/dL — ABNORMAL LOW (ref 3.5–5.0)
Alkaline Phosphatase: 165 U/L — ABNORMAL HIGH (ref 38–126)
Anion gap: 7 (ref 5–15)
BUN: 14 mg/dL (ref 8–23)
CO2: 22 mmol/L (ref 22–32)
Calcium: 8.4 mg/dL — ABNORMAL LOW (ref 8.9–10.3)
Chloride: 111 mmol/L (ref 98–111)
Creatinine, Ser: 0.38 mg/dL — ABNORMAL LOW (ref 0.44–1.00)
GFR calc Af Amer: >60 mL/min (ref >60)
GFR calc non Af Amer: >60 mL/min (ref >60)
Glucose, Bld: 128 mg/dL — ABNORMAL HIGH (ref 70–99)
Potassium: 3.4 mmol/L — ABNORMAL LOW (ref 3.5–5.1)
Sodium: 140 mmol/L (ref 135–145)
Total Bilirubin: 6.5 mg/dL — ABNORMAL HIGH (ref 0.3–1.2)
Total Protein: 5.3 g/dL — ABNORMAL LOW (ref 6.5–8.1)

## 2020-06-05 LAB — MAGNESIUM: Magnesium: 2 mg/dL (ref 1.7–2.4)

## 2020-06-05 LAB — CBC WITH DIFFERENTIAL/PLATELET
Abs Immature Granulocytes: 0.38 10*3/uL — ABNORMAL HIGH (ref 0.00–0.07)
Basophils Absolute: 0.1 10*3/uL (ref 0.0–0.1)
Basophils Relative: 1 %
Eosinophils Absolute: 0.4 10*3/uL (ref 0.0–0.5)
Eosinophils Relative: 3 %
HCT: 30.9 % — ABNORMAL LOW (ref 36.0–46.0)
Hemoglobin: 10.5 g/dL — ABNORMAL LOW (ref 12.0–15.0)
Immature Granulocytes: 3 %
Lymphocytes Relative: 10 %
Lymphs Abs: 1.2 10*3/uL (ref 0.7–4.0)
MCH: 31.5 pg (ref 26.0–34.0)
MCHC: 34 g/dL (ref 30.0–36.0)
MCV: 92.8 fL (ref 80.0–100.0)
Monocytes Absolute: 1.5 10*3/uL — ABNORMAL HIGH (ref 0.1–1.0)
Monocytes Relative: 12 %
Neutro Abs: 9 10*3/uL — ABNORMAL HIGH (ref 1.7–7.7)
Neutrophils Relative %: 71 %
Platelets: 320 10*3/uL (ref 150–400)
RBC: 3.33 MIL/uL — ABNORMAL LOW (ref 3.87–5.11)
RDW: 18.5 % — ABNORMAL HIGH (ref 11.5–15.5)
WBC: 12.6 10*3/uL — ABNORMAL HIGH (ref 4.0–10.5)
nRBC: 0.2 % (ref 0.0–0.2)

## 2020-06-05 MED ORDER — FUROSEMIDE 10 MG/ML IJ SOLN
20.0000 mg | Freq: Once | INTRAMUSCULAR | Status: AC
  Administered 2020-06-05: 20 mg via INTRAVENOUS
  Filled 2020-06-05: qty 2

## 2020-06-05 MED ORDER — POTASSIUM CHLORIDE CRYS ER 20 MEQ PO TBCR
40.0000 meq | EXTENDED_RELEASE_TABLET | Freq: Once | ORAL | Status: AC
  Administered 2020-06-05: 40 meq via ORAL
  Filled 2020-06-05: qty 2

## 2020-06-05 NOTE — Anesthesia Preprocedure Evaluation (Addendum)
Anesthesia Evaluation  Patient identified by MRN, date of birth, ID band Patient awake    Reviewed: Allergy & Precautions, NPO status , Patient's Chart, lab work & pertinent test results  Airway Mallampati: II  TM Distance: >3 FB Neck ROM: Full    Dental no notable dental hx. (+) Implants, Teeth Intact   Pulmonary former smoker,    Pulmonary exam normal breath sounds clear to auscultation       Cardiovascular hypertension, + CAD  Normal cardiovascular exam+ Valvular Problems/Murmurs MR  Rhythm:Regular Rate:Normal  01/21/20 Echo  Left Ventricle: Left ventricular ejection fraction, by visual estimation,  is 60 to 65%. The left ventricle has normal function. The left ventricle  has no regional wall motion abnormalities. There is borderline left  ventricular hypertrophy. Left  ventricular diastolic parameters are consistent with Grade I diastolic  dysfunction (impaired relaxation). Elevated left atrial pressure.   Right Ventricle: The right ventricular size is normal. No increase in  right ventricular wall thickness. Global RV systolic function is has  normal systolic function. The tricuspid regurgitant velocity is 2.85 m/s,  and with an assumed right atrial pressure  of 3 mmHg, the estimated right ventricular systolic pressure is mildly  elevated at 35.5 mmHg.   Left Atrium: Left atrial size was mildly dilated.   Right Atrium: Right atrial size was normal in size   Pericardium: There is no evidence of pericardial effusion.   Mitral Valve: The mitral valve is degenerative in appearance. There is  moderate thickening of the mitral valve leaflet(s). Moderate to severe  mitral annular calcification. Mild to moderate mitral valve regurgitation,    Neuro/Psych PSYCHIATRIC DISORDERS Dementia TIA   GI/Hepatic GERD  ,Lab Results      Component                Value               Date                      ALT                      66  (H)              06/05/2020                AST                      69 (H)              06/05/2020                ALKPHOS                  165 (H)             06/05/2020                BILITOT                  6.5 (H)             06/05/2020              Endo/Other  negative endocrine ROS  Renal/GU Lab Results      Component                Value               Date  WBC                      12.6 (H)            06/05/2020                HGB                      10.5 (L)            06/05/2020                HCT                      30.9 (L)            06/05/2020                MCV                      92.8                06/05/2020                PLT                      320                 06/05/2020                Musculoskeletal negative musculoskeletal ROS (+)   Abdominal   Peds  Hematology Lab Results      Component                Value               Date                      WBC                      12.6 (H)            06/05/2020                HGB                      10.5 (L)            06/05/2020                HCT                      30.9 (L)            06/05/2020                MCV                      92.8                06/05/2020                PLT                      320                 06/05/2020             Anesthesia Other Findings   Reproductive/Obstetrics  negative OB ROS                          Anesthesia Physical Anesthesia Plan  ASA: III  Anesthesia Plan: General   Post-op Pain Management:    Induction: Intravenous  PONV Risk Score and Plan: 4 or greater and Ondansetron and Treatment may vary due to age or medical condition  Airway Management Planned: Oral ETT  Additional Equipment: None  Intra-op Plan:   Post-operative Plan: Extubation in OR  Informed Consent:     Dental advisory given  Plan Discussed with: CRNA  Anesthesia Plan Comments: (GA w ETT)       Anesthesia Quick  Evaluation

## 2020-06-05 NOTE — Progress Notes (Signed)
[facility]) hospital liaison note  Patient approved by [facility] MD to be appropriate for hospices services upon discharge to ALF.  Hospital liaisons will continue to follow throughout hospital stay.  Please do not hesitate to call with any concerns.  Thank you for the opportunity to participate in this patient's care.  Domenic Moras, BSN, [employer] (in [place]) [phone]

## 2020-06-05 NOTE — Progress Notes (Signed)
Patient ID: [name], female   DOB: May 19, 1926, 86 y.o.   MRN: [id number]  PROGRESS NOTE    [name]  DTO:[id number] DOB: 08/08/1926 DOA: 06/01/2020 PCP: Robyne Peers, MD   Brief Narrative:  86 year old female with history of dementia, nonobstructive CAD, history of TIA presented from assisted living facility on 6-21 with worsening mental status.  Patient was apparently recently diagnosed with UTI/cystitis on 05/31/2020 and was prescribed Cipro but was unable to take this due to worsening nausea and mental status changes.  On presentation to the ED, CTA chest was unremarkable.  CT of the abdomen showed no obvious bowel obstruction however there was a concern for possible wall thickening of the mid transverse colon as well as biliary dilatation on CT up to 1.8 cm in diameter without clear obstruction.  She was found to have elevated bilirubin, AST and ALT.  GI was consulted.  Patient was started on IV fluids and antibiotics.  Assessment & Plan:   Sepsis: Present on admission Acute cystitis Infectious/inflammatory colitis Concern for cholangitis -Patient presented with altered mental status with CT of the abdomen showing concern for possible wall thickening of the mid transverse colon as well as biliary dilatation up to 1.8 cm in diameter without clear obstruction with elevated LFTs including bilirubin -Currently on cefepime and Flagyl.  Vancomycin discontinued on 06/01/2020.  Blood cultures negative so far.  Urine culture growing multiple species.  IV fluids discontinued. -Currently afebrile with stable blood pressure. -Currently wheezing slightly.  Will give 1 dose of IV Lasix  Probable new diagnosis of pancreatic head cancer with metastasis to liver with biliary dilation Elevated LFTs -MRCP suggestive of above.  Spoke to daughter at length at bedside on 06/02/2020 and on 06/03/2020 as well and recommended that hospice would probably be the best option for her.  Palliative care  has been consulted: Follow recommendations.  Patient will be followed up by hospice as an outpatient. -Overall prognosis is very poor.   -GI is planning for palliative biliary stent probably on Monday.  Monitor LFTs.  Acute metabolic encephalopathy in a patient with baseline dementia -Probably from sepsis.  Mental status has much improved and is probably back to her baseline mental status as per the daughter at bedside. -Follow cultures.  Delirium precautions.  Leukocytosis -Monitor  Acute kidney injury -Baseline creatinine 0.4-0.5 -Improved.  IV fluids as above  History of TIA -Resume Plavix once no plan for any intervention is determined after GI evaluation  Hypokalemia -Replace.  Repeat a.m. labs  DVT prophylaxis: Lovenox Code Status: DNR  family Communication: Daughter at bedside on 06/05/2020 Disposition Plan: Status is: Inpatient  Remains inpatient appropriate because:IV treatments appropriate due to intensity of illness or inability to take PO.  Currently requiring antibiotics.  GI is planning for palliative biliary stent.  Palliative care evaluation pending.    Dispo: The patient is from: ALF              Anticipated d/c is to: ALF with hospice              Anticipated d/c date is: 2 days              Patient currently is not medically stable to d/c.   Consultants: GI. palliative care  Procedures: None Antimicrobials:  Vancomycin/cefepime and Flagyl from 06/01/2020 onwards  Subjective: Patient seen and examined at bedside.  Daughter also present at bedside.  Patient is confused.  Poor historian.  Denies worsening abdominal pain.  No overnight fever or vomiting reported.  Nursing staff reported increased shortness of breath and wheezing overnight. Objective: Vitals:   06/04/20 1841 06/04/20 2025 06/05/20 0030 06/05/20 0635  BP: (!) 145/74 138/74  135/68  Pulse:  76  67  Resp: (!) 24 20  18   Temp: 98 F (36.7 C) 99.5 F (37.5 C)  99 F (37.2 C)  TempSrc: Oral  Oral  Oral  SpO2: 93% 94% 96% 94%  Weight:      Height:        Intake/Output Summary (Last 24 hours) at 06/05/2020 0743 Last data filed at 06/05/2020 0639 Gross per 24 hour  Intake 360 ml  Output 1175 ml  Net -815 ml   Filed Weights   06/01/20 0911 06/01/20 2100  Weight: 59 kg 62.6 kg    Examination:  General exam: No acute distress.  Alert female lying in bed.  Pleasantly confused.  Poor historian. Respiratory system: Bilateral decreased breath sounds at bases with scattered crackles.  Diffuse mild wheezing present. Cardiovascular system: Rate controlled, S1-S2 heard  gastrointestinal system: Abdomen is nondistended, soft and nontender.  Bowel sounds are heard  extremities: Trace lower extremity edema present.  No clubbing  Central nervous system: Awake, poor historian; pleasantly confused.  No focal neurological deficits.  Moves extremities  skin: No obvious rashes or ulcers  psychiatry: Cannot assess because of confusional state   Data Reviewed: I have personally reviewed following labs and imaging studies  CBC: Recent Labs  Lab 06/01/20 0750 06/01/20 0750 06/01/20 1216 06/02/20 0312 06/03/20 0320 06/04/20 0353 06/05/20 0425  WBC 25.4*   < > 16.8* 18.9* 12.7* 12.4* 12.6*  NEUTROABS 21.3*  --   --   --   --  9.6* 9.0*  HGB 13.0   < > 10.9* 10.3* 10.4* 10.3* 10.5*  HCT 37.8   < > 32.4* 30.7* 30.8* 30.3* 30.9*  MCV 92.0   < > 92.0 91.9 92.8 91.5 92.8  PLT 396   < > 353 275 286 299 320   < > = values in this interval not displayed.   Basic Metabolic Panel: Recent Labs  Lab 06/01/20 0750 06/01/20 0750 06/01/20 1216 06/02/20 0312 06/03/20 0320 06/04/20 0353 06/05/20 0425  NA 137  --   --  140 141 140 140  K 3.1*  --   --  3.6 3.1* 3.2* 3.4*  CL 96*  --   --  108 111 113* 111  CO2 26  --   --  21* 21* 21* 22  GLUCOSE 134*  --   --  117* 118* 135* 128*  BUN 17  --   --  32* 28* 21 14  CREATININE 0.90   < > 0.73 0.64 0.59 0.45 0.38*  CALCIUM 9.3  --   --  8.6*  8.4* 8.3* 8.4*  MG  --   --   --   --   --  2.1 2.0   < > = values in this interval not displayed.   GFR: Estimated Creatinine Clearance: 37.8 mL/min (A) (by C-G formula based on SCr of 0.38 mg/dL (L)). Liver Function Tests: Recent Labs  Lab 06/01/20 0750 06/02/20 0312 06/03/20 0320 06/04/20 0353 06/05/20 0425  AST 149* 126* 116* 91* 69*  ALT 100* 80* 78* 75* 66*  ALKPHOS 280* 184* 192* 190* 165*  BILITOT 9.3* 6.9* 6.8* 7.4* 6.5*  PROT 6.3* 5.4* 5.3* 5.4* 5.3*  ALBUMIN 2.9* 2.5* 2.2* 2.1* 2.1*   Recent Labs  Lab  06/01/20 0750  LIPASE 108*   Recent Labs  Lab 06/01/20 0750  AMMONIA 23   Coagulation Profile: Recent Labs  Lab 06/01/20 0750  INR 1.0   Cardiac Enzymes: No results for input(s): CKTOTAL, CKMB, CKMBINDEX, TROPONINI in the last 168 hours. BNP (last 3 results) No results for input(s): PROBNP in the last 8760 hours. HbA1C: No results for input(s): HGBA1C in the last 72 hours. CBG: No results for input(s): GLUCAP in the last 168 hours. Lipid Profile: No results for input(s): CHOL, HDL, LDLCALC, TRIG, CHOLHDL, LDLDIRECT in the last 72 hours. Thyroid Function Tests: No results for input(s): TSH, T4TOTAL, FREET4, T3FREE, THYROIDAB in the last 72 hours. Anemia Panel: No results for input(s): VITAMINB12, FOLATE, FERRITIN, TIBC, IRON, RETICCTPCT in the last 72 hours. Sepsis Labs: Recent Labs  Lab 06/01/20 0750 06/01/20 0957  LATICACIDVEN 2.1* 1.8    Recent Results (from the past 240 hour(s))  Blood Culture (routine x 2)     Status: None (Preliminary result)   Collection Time: 06/01/20  7:48 AM   Specimen: BLOOD  Result Value Ref Range Status   Specimen Description   Final    BLOOD RIGHT ANTECUBITAL Performed at [facility], [place] [address], [place], [place] [address]    Special Requests   Final    BOTTLES DRAWN AEROBIC AND ANAEROBIC Blood Culture adequate volume Performed at [place] [address]  [place], [place] [address]    Culture   Final    NO GROWTH 3 DAYS Performed at [facility], [place] [address], [place], [place] [address]    Report Status PENDING  Incomplete  Blood Culture (routine x 2)     Status: None (Preliminary result)   Collection Time: 06/01/20  7:48 AM   Specimen: BLOOD  Result Value Ref Range Status   Specimen Description   Final    BLOOD LEFT ANTECUBITAL Performed at [place] [address], [place], [place] [address]    Special Requests   Final    BOTTLES DRAWN AEROBIC AND ANAEROBIC Blood Culture adequate volume Performed at [place] [address] [place], [place] [address]    Culture   Final    NO GROWTH 3 DAYS Performed at [facility], [place] [address], [place],  [address]    Report Status PENDING  Incomplete  SARS Coronavirus 2 by RT PCR (hospital order, performed in [facility] hospital lab) Nasopharyngeal Nasopharyngeal Swab     Status: None   Collection Time: 06/01/20  7:55 AM   Specimen: Nasopharyngeal Swab  Result Value Ref Range Status   SARS Coronavirus 2 NEGATIVE NEGATIVE Final    Comment: (NOTE) SARS-CoV-2 target nucleic acids are NOT DETECTED. The SARS-CoV-2 RNA is generally detectable in upper and lower respiratory specimens during the acute phase of infection. The lowest concentration of SARS-CoV-2 viral copies this assay can detect is 250 copies / mL. A negative result does not preclude SARS-CoV-2 infection and should not be used as the sole basis for treatment or other patient management decisions.  A negative result may occur with improper specimen collection / handling, submission of specimen other than nasopharyngeal swab, presence of viral mutation(s) within the areas targeted by this assay, and inadequate number of viral copies (<250 copies / mL). A negative result must be combined with clinical observations, patient history, and epidemiological information. Fact  Sheet for Patients:   StrictlyIdeas.no Fact Sheet for Healthcare Providers: BankingDealers.co.za This  test is not yet approved or cleared  by the [place] and has been authorized for detection and/or diagnosis of SARS-CoV-2 by FDA under an Emergency Use Authorization (EUA).  This EUA will remain in effect (meaning this test can be used) for the duration of the COVID-19 declaration under Section 564(b)(1) of the Act, 21 U.S.C. section 360bbb-3(b)(1), unless the authorization is terminated or revoked sooner. Performed at [facility], [place] [address] [place], [place] [address]   Urine culture     Status: Abnormal   Collection Time: 06/01/20 10:48 AM   Specimen: In/Out Cath Urine  Result Value Ref Range Status   Specimen Description   Final    IN/OUT CATH URINE Performed at [place] [address], [place], [place] [address]    Special Requests   Final    NONE Performed at [facility], [place] [address], [place], [place] [address]    Culture MULTIPLE SPECIES PRESENT, SUGGEST RECOLLECTION (A)  Final   Report Status 06/02/2020 FINAL  Final         Radiology Studies: DG CHEST PORT 1 VIEW  Result Date: 06/04/2020 CLINICAL DATA:  Shortness of breath, dyspnea, history coronary artery disease, GERD, hypertension, thoracic aortic aneurysm EXAM: PORTABLE CHEST 1 VIEW COMPARISON:  Portable exam 1858 hours compared to 06/01/2020 FINDINGS: Upper normal size of cardiac silhouette. Stable mediastinal contours with atherosclerotic calcification and tortuosity of thoracic aorta. Mitral annular calcification noted. Peribronchial thickening with perihilar infiltrates which could represent edema or infection. Minimal bibasilar atelectasis. No pleural effusion or pneumothorax. Bones demineralized. IMPRESSION: Enlargement of cardiac silhouette with mild bibasilar atelectasis. Bronchitic  changes with perihilar infiltrates question edema versus infection. Electronically Signed   By: Lavonia Dana M.D.   On: 06/04/2020 19:14        Scheduled Meds: . amLODipine  5 mg Oral Daily  . artificial tears   Both Eyes BID  . donepezil  10 mg Oral QHS  . enoxaparin (LOVENOX) injection  40 mg Subcutaneous Q24H  . ipratropium-albuterol  3 mL Nebulization Q6H  . memantine  10 mg Oral BID  . pantoprazole  40 mg Oral Daily   Continuous Infusions: . ceFEPime (MAXIPIME) IV 2 g (06/04/20 2302)  . metronidazole 500 mg (06/05/20 6579)          Aline August, MD [facility] 06/05/2020, 7:43 AM

## 2020-06-05 NOTE — Progress Notes (Signed)
PT Cancellation Note  Patient Details Name: [name] MRN: [id number] DOB: 1926-06-17   Cancelled Treatment:    Reason Eval/Treat Not Completed: Attempted PT eval again on today (3rd attempt). Pt and family requested PT check back another time. They report pt has had some difficulty with her breathing this morning. They are open to PT continuing to check back. Daughter stated pt is scheduled for a procedure on Monday and that pt will likely not be up to PT that day. So, will plan to attempt PT eval again on Tuesday. Per chart, plan is for return to ALF with hospice.    Doreatha Massed, PT Acute Rehabilitation

## 2020-06-05 NOTE — Progress Notes (Signed)
Pharmacy Antibiotic Note  [name] is a 86 y.o. female admitted on 06/01/2020 with malaise/fatigue.  Pharmacy has been consulted for vancomycin + cefepime dosing.  Assessment: Pt is a 74 yoF with PMH significant for dementia, history of MSSA bacteremia in Jan 2021 treated with cefazolin. Antibiotics being initiated for sepsis - potential sources: cystitis, cholangitis, colitis.  Today, 06/05/20 Now Afeb, WBC decr, SCr low  Plan:  Cefepime 2 g IV q12h  Flagyl 500mg  IV q8 per MD  ERCP with stent planned for 6/7 if respiratory status improves   Height: 5' (152.4 cm) Weight: 62.6 kg (138 lb 0.1 oz) IBW/kg (Calculated) : 45.5  Temp (24hrs), Avg:98.7 F (37.1 C), Min:98 F (36.7 C), Max:99.5 F (37.5 C)  Recent Labs  Lab 06/01/20 0750 06/01/20 0750 06/01/20 0957 06/01/20 1216 06/02/20 0312 06/03/20 0320 06/04/20 0353 06/05/20 0425  WBC 25.4*   < >  --  16.8* 18.9* 12.7* 12.4* 12.6*  CREATININE 0.90   < >  --  0.73 0.64 0.59 0.45 0.38*  LATICACIDVEN 2.1*  --  1.8  --   --   --   --   --    < > = values in this interval not displayed.    Estimated Creatinine Clearance: 37.8 mL/min (A) (by C-G formula based on SCr of 0.38 mg/dL (L)).    Allergies  Allergen Reactions  . Sulfa Antibiotics Swelling and Other (See Comments)    Throat swelling  . Aspirin Other (See Comments)    Feels like choking  . Crestor [Rosuvastatin] Other (See Comments)    Lower Extremity Myalgia   . Bactrim Other (See Comments)    Unknown  . Demeclocycline Rash  . Niacin And Related Other (See Comments)    Turns red in the face  . Penicillins Other (See Comments)    Unknown Reaction Per The Patient Unknown  . Relafen [Nabumetone] Other (See Comments)    Unknown Reaction Per The Patient Unknown  . Tetracyclines & Related Rash    Antimicrobials this admission: cefepime 6/2 >>  metronidazole 6/2 >>  Vancomycin 6/2 >> 6/3  Dose adjustments this admission:  Microbiology  results: 6/2 BCx: ngtd 6/2 Urine: multiple species, suggest recollection 6/2 COVID:  Negative  Thank you for allowing pharmacy to be a part of this patient's care.  Minda Ditto, PharmD 06/05/2020 12:24 PM

## 2020-06-05 NOTE — Progress Notes (Signed)
Subjective: No abdominal pain. Some confusion and shortness of breath  Objective: Vital signs in last 24 hours: Temp:  [98 F (36.7 C)-99.5 F (37.5 C)] 99 F (37.2 C) (06/06 0635) Pulse Rate:  [67-76] 67 (06/06 0635) Resp:  [18-24] 18 (06/06 0635) BP: (126-145)/(62-74) 135/68 (06/06 0635) SpO2:  [93 %-97 %] 93 % (06/06 0821) Weight change:  Last BM Date: 06/03/20  PE: GEN:  Jaundiced, somewhat confused, a bit tachypneic at rest ABD:  Soft, non-tender  Lab Results: CBC    Component Value Date/Time   WBC 12.6 (H) 06/05/2020 0425   RBC 3.33 (L) 06/05/2020 0425   HGB 10.5 (L) 06/05/2020 0425   HCT 30.9 (L) 06/05/2020 0425   PLT 320 06/05/2020 0425   MCV 92.8 06/05/2020 0425   MCV 93.3 03/07/2015 1531   MCH 31.5 06/05/2020 0425   MCHC 34.0 06/05/2020 0425   RDW 18.5 (H) 06/05/2020 0425   LYMPHSABS 1.2 06/05/2020 0425   MONOABS 1.5 (H) 06/05/2020 0425   EOSABS 0.4 06/05/2020 0425   BASOSABS 0.1 06/05/2020 0425   CMP     Component Value Date/Time   NA 140 06/05/2020 0425   K 3.4 (L) 06/05/2020 0425   CL 111 06/05/2020 0425   CO2 22 06/05/2020 0425   GLUCOSE 128 (H) 06/05/2020 0425   BUN 14 06/05/2020 0425   CREATININE 0.38 (L) 06/05/2020 0425   CREATININE 0.60 03/01/2016 1032   CALCIUM 8.4 (L) 06/05/2020 0425   PROT 5.3 (L) 06/05/2020 0425   ALBUMIN 2.1 (L) 06/05/2020 0425   AST 69 (H) 06/05/2020 0425   ALT 66 (H) 06/05/2020 0425   ALKPHOS 165 (H) 06/05/2020 0425   BILITOT 6.5 (H) 06/05/2020 0425   GFRNONAA >60 06/05/2020 0425   GFRNONAA 82 03/01/2016 1032   GFRAA >60 06/05/2020 0425   GFRAA >89 03/01/2016 1032   Assessment:  1.  Obstructive jaundice.   2.  Pancreatic mass with liver lesions.  Suspected metastatic pancreatic cancer. 3.  Shortness of breath.  New onset.  Volume overload?  Plan:  1.  I discussed case with patient, nurse, and patient's daughter (who is a PA in the [employer] area). 2.  Tentative plan for ERCP tomorrow morning 730 am with  Dr. Watt Climes, pending improvement of her respiratory status.  Daughter aware that if respiratory status doesn't improve, we may have to postpone procedure.   Landry Dyke 06/05/2020, 11:50 AM   Cell [phone] If no answer or after 5 PM call [phone]

## 2020-06-06 ENCOUNTER — Encounter (HOSPITAL_COMMUNITY)
Admission: EM | Disposition: A | Discharge status: Nursing Home (Includes ICF) | Payer: Self-pay | Source: Skilled Nursing Facility | Attending: Internal Medicine

## 2020-06-06 ENCOUNTER — Inpatient Hospital Stay (HOSPITAL_COMMUNITY): Payer: PPO | Admitting: Certified Registered Nurse Anesthetist

## 2020-06-06 ENCOUNTER — Inpatient Hospital Stay (HOSPITAL_COMMUNITY): Payer: PPO

## 2020-06-06 ENCOUNTER — Encounter (HOSPITAL_COMMUNITY): Payer: Self-pay | Admitting: Internal Medicine

## 2020-06-06 HISTORY — PX: SPHINCTEROTOMY: SHX5544

## 2020-06-06 HISTORY — PX: BIOPSY: SHX5522

## 2020-06-06 HISTORY — PX: ENDOSCOPIC RETROGRADE CHOLANGIOPANCREATOGRAPHY (ERCP) WITH PROPOFOL: SHX5810

## 2020-06-06 HISTORY — PX: BILIARY STENT PLACEMENT: SHX5538

## 2020-06-06 LAB — CULTURE, BLOOD (ROUTINE X 2)
Culture: NO GROWTH
Culture: NO GROWTH
Special Requests: ADEQUATE
Special Requests: ADEQUATE

## 2020-06-06 LAB — CBC WITH DIFFERENTIAL/PLATELET
Abs Immature Granulocytes: 0.54 10*3/uL — ABNORMAL HIGH (ref 0.00–0.07)
Basophils Absolute: 0.1 10*3/uL (ref 0.0–0.1)
Basophils Relative: 1 %
Eosinophils Absolute: 0.4 10*3/uL (ref 0.0–0.5)
Eosinophils Relative: 3 %
HCT: 32.8 % — ABNORMAL LOW (ref 36.0–46.0)
Hemoglobin: 10.8 g/dL — ABNORMAL LOW (ref 12.0–15.0)
Immature Granulocytes: 4 %
Lymphocytes Relative: 8 %
Lymphs Abs: 1.1 10*3/uL (ref 0.7–4.0)
MCH: 30.9 pg (ref 26.0–34.0)
MCHC: 32.9 g/dL (ref 30.0–36.0)
MCV: 94 fL (ref 80.0–100.0)
Monocytes Absolute: 1.5 10*3/uL — ABNORMAL HIGH (ref 0.1–1.0)
Monocytes Relative: 12 %
Neutro Abs: 8.9 10*3/uL — ABNORMAL HIGH (ref 1.7–7.7)
Neutrophils Relative %: 72 %
Platelets: 332 10*3/uL (ref 150–400)
RBC: 3.49 MIL/uL — ABNORMAL LOW (ref 3.87–5.11)
RDW: 18.6 % — ABNORMAL HIGH (ref 11.5–15.5)
WBC: 12.5 10*3/uL — ABNORMAL HIGH (ref 4.0–10.5)
nRBC: 0 % (ref 0.0–0.2)

## 2020-06-06 LAB — COMPREHENSIVE METABOLIC PANEL
ALT: 64 U/L — ABNORMAL HIGH (ref 0–44)
AST: 68 U/L — ABNORMAL HIGH (ref 15–41)
Albumin: 2.1 g/dL — ABNORMAL LOW (ref 3.5–5.0)
Alkaline Phosphatase: 162 U/L — ABNORMAL HIGH (ref 38–126)
Anion gap: 8 (ref 5–15)
BUN: 13 mg/dL (ref 8–23)
CO2: 23 mmol/L (ref 22–32)
Calcium: 8 mg/dL — ABNORMAL LOW (ref 8.9–10.3)
Chloride: 106 mmol/L (ref 98–111)
Creatinine, Ser: 0.5 mg/dL (ref 0.44–1.00)
GFR calc Af Amer: >60 mL/min (ref >60)
GFR calc non Af Amer: >60 mL/min (ref >60)
Glucose, Bld: 131 mg/dL — ABNORMAL HIGH (ref 70–99)
Potassium: 3.1 mmol/L — ABNORMAL LOW (ref 3.5–5.1)
Sodium: 137 mmol/L (ref 135–145)
Total Bilirubin: 6.5 mg/dL — ABNORMAL HIGH (ref 0.3–1.2)
Total Protein: 5.1 g/dL — ABNORMAL LOW (ref 6.5–8.1)

## 2020-06-06 LAB — MAGNESIUM: Magnesium: 1.9 mg/dL (ref 1.7–2.4)

## 2020-06-06 SURGERY — ENDOSCOPIC RETROGRADE CHOLANGIOPANCREATOGRAPHY (ERCP) WITH PROPOFOL
Anesthesia: General

## 2020-06-06 MED ORDER — SODIUM CHLORIDE 0.9 % IV SOLN: INTRAVENOUS | Status: DC

## 2020-06-06 MED ORDER — IPRATROPIUM-ALBUTEROL 0.5-2.5 (3) MG/3ML IN SOLN
3.0000 mL | Freq: Two times a day (BID) | RESPIRATORY_TRACT | Status: DC
  Administered 2020-06-06 – 2020-06-07 (×2): 3 mL via RESPIRATORY_TRACT
  Filled 2020-06-06 (×3): qty 3

## 2020-06-06 MED ORDER — SUGAMMADEX SODIUM 200 MG/2ML IV SOLN
INTRAVENOUS | Status: DC | PRN
  Administered 2020-06-06: 500 mg via INTRAVENOUS

## 2020-06-06 MED ORDER — ROCURONIUM BROMIDE 10 MG/ML (PF) SYRINGE
PREFILLED_SYRINGE | INTRAVENOUS | Status: DC | PRN
  Administered 2020-06-06: 60 mg via INTRAVENOUS

## 2020-06-06 MED ORDER — FENTANYL CITRATE (PF) 250 MCG/5ML IJ SOLN
INTRAMUSCULAR | Status: DC | PRN
  Administered 2020-06-06: 25 ug via INTRAVENOUS
  Administered 2020-06-06: 50 ug via INTRAVENOUS
  Administered 2020-06-06: 25 ug via INTRAVENOUS

## 2020-06-06 MED ORDER — FENTANYL CITRATE (PF) 100 MCG/2ML IJ SOLN: 25.0000 ug | INTRAMUSCULAR | Status: DC | PRN

## 2020-06-06 MED ORDER — PROPOFOL 10 MG/ML IV BOLUS
INTRAVENOUS | Status: AC
  Filled 2020-06-06: qty 20

## 2020-06-06 MED ORDER — ONDANSETRON HCL 4 MG/2ML IJ SOLN
INTRAMUSCULAR | Status: AC
  Filled 2020-06-06: qty 2

## 2020-06-06 MED ORDER — SODIUM CHLORIDE 0.9 % IV SOLN
INTRAVENOUS | Status: DC | PRN
  Administered 2020-06-06: 30 mL

## 2020-06-06 MED ORDER — LACTATED RINGERS IV SOLN
INTRAVENOUS | Status: DC
  Administered 2020-06-06: 1000 mL via INTRAVENOUS

## 2020-06-06 MED ORDER — ONDANSETRON HCL 4 MG/2ML IJ SOLN
INTRAMUSCULAR | Status: DC | PRN
  Administered 2020-06-06: 4 mg via INTRAVENOUS

## 2020-06-06 MED ORDER — ONDANSETRON HCL 4 MG/2ML IJ SOLN: 4.0000 mg | Freq: Once | INTRAMUSCULAR | Status: DC | PRN

## 2020-06-06 MED ORDER — GLUCAGON HCL RDNA (DIAGNOSTIC) 1 MG IJ SOLR
INTRAMUSCULAR | Status: AC
  Filled 2020-06-06: qty 1

## 2020-06-06 MED ORDER — INDOMETHACIN 50 MG RE SUPP
RECTAL | Status: AC
  Filled 2020-06-06: qty 2

## 2020-06-06 MED ORDER — PROPOFOL 10 MG/ML IV BOLUS
INTRAVENOUS | Status: DC | PRN
  Administered 2020-06-06: 60 mg via INTRAVENOUS

## 2020-06-06 MED ORDER — SODIUM CHLORIDE 0.9 % IV SOLN
INTRAVENOUS | Status: DC | PRN
  Administered 2020-06-06 (×2): 1000 mL via INTRAVENOUS

## 2020-06-06 MED ORDER — PHENYLEPHRINE 40 MCG/ML (10ML) SYRINGE FOR IV PUSH (FOR BLOOD PRESSURE SUPPORT)
PREFILLED_SYRINGE | INTRAVENOUS | Status: DC | PRN
  Administered 2020-06-06: 120 ug via INTRAVENOUS
  Administered 2020-06-06 (×4): 80 ug via INTRAVENOUS

## 2020-06-06 MED ORDER — LIDOCAINE 2% (20 MG/ML) 5 ML SYRINGE
INTRAMUSCULAR | Status: DC | PRN
  Administered 2020-06-06: 60 mg via INTRAVENOUS

## 2020-06-06 MED ORDER — FENTANYL CITRATE (PF) 100 MCG/2ML IJ SOLN
INTRAMUSCULAR | Status: AC
  Filled 2020-06-06: qty 2

## 2020-06-06 MED ORDER — FUROSEMIDE 10 MG/ML IJ SOLN
20.0000 mg | Freq: Once | INTRAMUSCULAR | Status: AC
  Administered 2020-06-06: 20 mg via INTRAVENOUS
  Filled 2020-06-06: qty 2

## 2020-06-06 MED ORDER — ACETAMINOPHEN 10 MG/ML IV SOLN: 1000.0000 mg | Freq: Once | INTRAVENOUS | Status: DC | PRN

## 2020-06-06 NOTE — Op Note (Signed)
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 Estimated Blood Loss:     Estimated blood loss: none. Procedure:                Pre-Anesthesia Assessment:                           - Prior to the procedure, a History and Physical                            was performed, and patient medications and                            allergies were reviewed. The patient's tolerance of                            previous anesthesia was also reviewed. The risks                            and benefits of the procedure and the sedation                            options and risks were discussed with the patient.                            All questions were answered, and informed consent                            was obtained. Prior Anticoagulants: The patient has                            taken Lovenox (enoxaparin), last dose was 1 day                            prior to procedure.  ASA Grade Assessment: III - A                            patient with severe systemic disease. After                            reviewing the risks and benefits, the patient was                            deemed in satisfactory condition to undergo the                            procedure.                           After obtaining informed consent, the scope was                            passed  under direct vision. Throughout the                            procedure, the patient's blood pressure, pulse, and                            oxygen saturations were monitored continuously. The                            [id number]-Q180V ([id number]) Olympus Duodenoscope was                            introduced through the mouth, and used to inject                            contrast into and used to cannulate the bile duct.                            The ERCP was somewhat difficult due to abnormal                            anatomy. Successful completion of the procedure was                            aided by performing the maneuvers documented                            (below) in this report. The patient tolerated the                            procedure well. Scope In: Scope Out: Findings:      The scope was passed under direct vision through the upper GI tract. One       non-obstructing minimally oozing superficial duodenal ulcer was found in       the second portion of the duodenum. Duodenum ulcer was biopsied with a  hot large-capacity forceps for histology at the end of the procedure.       The major papilla was congested with edematous folds throughout the       second portion of the duodenum but a normal-appearing ostia was seen but       cannulation was difficult due to the distal stricture and we proceeded       with. A biliary pre-cut sphincterotomy was made with a Hydratome       sphincterotome using ERBE electrocautery. There was no       post-sphincterotomy bleeding. Deep selective cannulation was then       readily obtained and dye was injected which confirmed a dilated CBD the       biliary sphincterotomy was extended with a Hydratome sphincterotome       using ERBE electrocautery. There was no post-sphincterotomy bleeding.       The sphincterotomy was done until we had adequate biliary drainage the       common bile duct contained a single localized very distal short        stenosis. To discover objects and further evaluate the stricture, the       biliary tree was swept with a 15 mm balloon starting at the bifurcation.       Nothing was found. But the balloon could not easily be withdrawn through       the patent sphincterotomy site and we elected to place One 10 Fr by 4 cm       uncovered metal stent with no external flaps and no internal flaps was       placed 3.5 cm into the common bile duct. Bile flowed through the stent.       The stent was in good position. There was no pancreatic duct wire       advancements or injections throughout the procedure Impression:               - Non-obstructing minimally oozing duodenal ulcer                            with no stigmata of bleeding. Probably cancerous                            induced etiology. This was biopsied at the end of                            the procedure                           - The major papilla appeared congested.                           - A single localized biliary stricture was found in                            the common bile duct. The stricture was malignant                            appearing based on CT and MRCP. This stricture was  treated with biliary sphincterotomy x2 and stenting                            as above.                           - Biopsy was performed Duodenum ulcer.                           - A biliary precut sphincterotomy was performed to                            aid in cannulation.                           - A conventional sphincterotomy was performed.                           - The biliary tree was swept and nothing was found.                           - One uncovered metal stent was placed into the                            common bile duct. Moderate Sedation:      Not Applicable - Patient had care per Anesthesia. Recommendation:           - Clear liquid diet for 6 hours. If doing well this                             evening may have soft solids                           - Resume Lovenox (enoxaparin) at prior dose today.                            In 2 days if no signs of complication may resume                            her blood thinner if she truly needs it going                            forward                           - Await path results. Continue pump inhibitors                            avoid aspirin and nonsteroidals                           - Return to GI clinic PRN. Consider oncology                            consult if family requests                           -  Telephone GI clinic if symptomatic PRN.                           - Check liver enzymes (AST, ALT, alkaline                            phosphatase, bilirubin) and hemogram with white                            blood cell count and platelets tomorrow. Procedure Code(s):        --- Professional ---                           [phone], Endoscopic retrograde                            cholangiopancreatography (ERCP); with placement of                            endoscopic stent into biliary or pancreatic duct,                            including pre- and post-dilation and guide wire                            passage, when performed, including sphincterotomy,                            when performed, each stent                           [phone], Unlisted procedure, biliary tract Diagnosis Code(s):        --- Professional ---                           K26.4, Chronic or unspecified duodenal ulcer with                            hemorrhage                           K83.8, Other specified diseases of biliary tract                           K83.1, Obstruction of bile duct                           R17, Unspecified jaundice                           C25.0, Malignant neoplasm of head of pancreas                           R93.2, Abnormal findings on diagnostic imaging of                            liver  and biliary tract CPT copyright  2019 American Medical Association. All rights reserved. The codes documented in this report are preliminary and upon coder review may  be revised to meet current compliance requirements. Clarene Essex, MD 06/06/2020 9:34:39 AM This report has been signed electronically. Number of Addenda: 0

## 2020-06-06 NOTE — Progress Notes (Signed)
See ERCP note for details but 1 additional note is no signs of infection was seen in the CBD so probably can stop antibiotics in a day or 2 from that standpoint particularly if liver tests decreasing with stent as expected

## 2020-06-06 NOTE — TOC Progression Note (Signed)
Transition of Care [facility]) - Progression Note    Patient Details  Name: [name] MRN: [id number] Date of Birth: 11-22-1926  [facility] CM/SW Contact  Purcell Mouton, RN Phone Number: 06/06/2020, 11:56 AM  Clinical Narrative:    Spoke with pt's daughter concerning discharge plans. Pt will go back to [facility] with [facility]/hospice.    Expected Discharge Plan: Assisted Living Barriers to Discharge: No Barriers Identified  Expected Discharge Plan and Services Expected Discharge Plan: Assisted Living   Discharge Planning Services: CM Consult   Living arrangements for the past 2 months: Single Family Home                                       Social Determinants of Health (SDOH) Interventions    Readmission Risk Interventions No flowsheet data found.

## 2020-06-06 NOTE — Progress Notes (Signed)
Daily Progress Note   Patient Name: [name]       Date: 06/06/2020 DOB: 01-06-1926  Age: 86 y.o. MRN#: [id number] Attending Physician: Aline August, MD Primary Care Physician: Robyne Peers, MD Admit Date: 06/01/2020  Reason for Consultation/Follow-up: Establishing goals of care and Hospice Evaluation  Subjective: I saw and examined Ms. Dipasquale today.  Her daughter [name] was also at the bedside.  She just come back from ERCP this morning reports that things went well.  We discussed plan for transition back to [facility] with [facility] services.    [name] and I discussed plans for medication on discharge.  She would like to work to minimize her mom's pill burden, particularly with things such as multiple vitamin she had been taking before admission.  We reviewed her medication list together and I stopped medications that she and I agreed would be unlikely to add quality time to her mother's life.  We also discussed addition of medications for symptoms on discharge.  At this point, Ms. Oshima reports doing very well and we decided to wait until she is back in her home environment have hospice evaluate her to see what symptom management medications we be most appropriate for her on a daily basis.  I called to touch base with [facility] and to let them know we are probably looking at discharge back to [facility] tomorrow.  Ms. 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   Length of Stay: 5  Current Medications: Scheduled Meds:   artificial tears   Both Eyes BID   donepezil  10 mg Oral QHS   enoxaparin (LOVENOX) injection  40 mg Subcutaneous Q24H   ipratropium-albuterol  3 mL Nebulization BID   memantine  10  mg Oral BID   pantoprazole  40 mg Oral Daily    Continuous Infusions:  sodium chloride Stopped (06/06/20 1501)   ceFEPime (MAXIPIME) IV Stopped (06/06/20 1127)   metronidazole Stopped (06/06/20 1501)    PRN Meds: sodium chloride, albuterol, hydrocortisone, LORazepam, ondansetron **OR** ondansetron (ZOFRAN) IV, senna-docusate  Physical Exam       General: No acute distress, awake and alert Respiratory: Mild wheezing noted Extremity: Some edema, skin warm and dry Neuro: No focal deficits Psych: Normal mood and affect.  Pleasant  and cooperative.  Vital Signs: BP (!) 108/54 (BP Location: Left Arm)    Pulse 73    Temp 98.4 F (36.9 C) (Oral)    Resp 18    Ht 5' (1.524 m)    Wt 62.6 kg    SpO2 93%    BMI 26.95 kg/m  SpO2: SpO2: 93 % O2 Device: O2 Device: Nasal Cannula O2 Flow Rate: O2 Flow Rate (L/min): 2 L/min  Intake/output summary:   Intake/Output Summary (Last 24 hours) at 06/06/2020 1946 Last data filed at 06/06/2020 1501 Gross per 24 hour  Intake 2352.35 ml  Output 1000 ml  Net 1352.35 ml   LBM: Last BM Date: 06/05/20 Baseline Weight: Weight: 59 kg Most recent weight: Weight: 62.6 kg       Palliative Assessment/Data:      Patient Active Problem List   Diagnosis Date Noted   Sepsis secondary to UTI ([place]) 06/01/2020   Elevated bilirubin 06/01/2020   CAP (community acquired pneumonia) 01/11/2020   Sepsis ([place]) 01/10/2020   Alzheimer's disease ([place]) 07/02/2019   Acute lower UTI 78/46/9629   Acute metabolic encephalopathy 52/84/1324   Encephalopathy acute 02/10/2019   Chest pain, rule out acute myocardial infarction 09/03/2017   Heart palpitations 07/31/2016   Neck pain, musculoskeletal 04/26/2015   Variants of migraine, not elsewhere classified, without mention of intractable migraine without mention of status migrainosus 08/17/2014   Tension headache 08/17/2014   TIA (transient ischemic attack) 07/30/2014   HTN (hypertension) 07/30/2014    Aortic aneurysm, thoracic ([place]) 06/25/2014   Chest pain 07/29/2012   CAD (coronary artery disease) 06/03/2012   Hypercholesteremia    GERD (gastroesophageal reflux disease)     Palliative Care Assessment & Plan   Patient Profile:  Recommendations/Plan:  Reviewed medication regimen with patient's daughter, [name].  [name] is a primary care provider in [facility] and we discussed her desire to reduce her mother's pill burden if possible.  I discontinued some medications that I would not consider essential with goals moving forward.  I would also hold any vitamins that she was taking prior to admission on her return back to assisted living.    Additionally, reviewed her blood pressures and they have been 110s over 40-60s and with her having some wheezing and edema I stopped her Norvasc today as well.  Hospice can certainly continue to monitor blood pressure and make appropriate recommendations if pressures to become an issue.  At this point, I think she may be better served by allowing some elevation in pressure to reduce pill burden and minimize side effects from medications.  Her daughter informing that she needs FL 2 sent to assisted living prior to discharge.  I passed this along to case management who reports this is generally done on the day of discharge.  Ms. Oman enjoys having black coffee.  Please provide to her as requested.  Code Status:    Code Status Orders  (From admission, onward)         Start     Ordered   06/01/20 1107  Do not attempt resuscitation (DNR)  Continuous    Question Answer Comment  In the event of cardiac or respiratory ARREST Do not call a code blue   In the event of cardiac or respiratory ARREST Do not perform Intubation, CPR, defibrillation or ACLS   In the event of cardiac or respiratory ARREST Use medication by any route, position, wound care, and other measures to relive pain and suffering. May use oxygen, suction  and manual treatment  of airway obstruction as needed for comfort.   Comments Confirmed with daughter (POA) at bedside      06/01/20 1111        Code Status History    Date Active Date Inactive Code Status Order ID Comments User Context   01/11/2020 0100 01/14/2020 2235 DNR [id number]  Rise Patience, MD ED   02/10/2019 1024 02/12/2019 1503 DNR [id number]  Barb Merino, MD ED   09/03/2017 2312 09/04/2017 1836 Full Code [id number]  Etta Quill, DO ED   07/30/2014 1339 07/31/2014 1641 Full Code [id number]  Debbe Odea, MD ED   07/30/2012 0843 07/31/2012 1956 Full Code [id number]  Street, Sharon Mt, MD Inpatient   Advance Care Planning Activity    Advance Directive Documentation     Most Recent Value  Type of Advance Directive  Healthcare Power of Attorney  Pre-existing out of facility DNR order (yellow form or pink MOST form)  --  "MOST" Form in Place?  --       Prognosis:   < 6 months  Discharge Planning:  Back to [facility] with [facility] was discussed with patient and daughter.  Thank you for allowing the Palliative Medicine Team to assist in the care of this patient.   Total Time 20 Prolonged Time Billed No      Greater than 50%  of this time was spent counseling and coordinating care related to the above assessment and plan.  Micheline Rough, MD  Please contact Palliative Medicine Team phone at [phone] for questions and concerns.

## 2020-06-06 NOTE — Progress Notes (Signed)
[name] 7:41 AM  Subjective: Patient seen and examined in hospital computer chart reviewed and her case discussed with my partner Dr. Paulita Fujita  Objective: Vital signs stable afebrile no acute distress exam please see preassessment evaluation labs MRI CT reviewed  Assessment: CBD obstruction probably pancreatic cancer  Plan: The risk benefits methods and success rate was rediscussed with the patient and her daughter who is also her power of attorney and will proceed today with anesthesia assistance with further work-up plans pending those findings  [facility]  office [phone] After 5PM or if no answer call [phone]

## 2020-06-06 NOTE — Anesthesia Procedure Notes (Signed)
Procedure Name: Intubation Date/Time: 06/06/2020 7:47 AM Performed by: Talbot Grumbling, CRNA Pre-anesthesia Checklist: Patient identified, Emergency Drugs available, Suction available and Patient being monitored Patient Re-evaluated:Patient Re-evaluated prior to induction Oxygen Delivery Method: Circle system utilized Preoxygenation: Pre-oxygenation with 100% oxygen Induction Type: IV induction Ventilation: Mask ventilation without difficulty Laryngoscope Size: Mac and 3 Grade View: Grade I Tube type: Oral Tube size: 7.0 mm Number of attempts: 1 Airway Equipment and Method: Stylet Placement Confirmation: ETT inserted through vocal cords under direct vision,  positive ETCO2 and breath sounds checked- equal and bilateral Secured at: 21 cm Tube secured with: Tape Dental Injury: Teeth and Oropharynx as per pre-operative assessment

## 2020-06-06 NOTE — Progress Notes (Addendum)
Patient ID: [name], female   DOB: [birth year], 86 y.o.   MRN: [id number]  PROGRESS NOTE    [name]  VQQ:[id number] DOB: 1926-04-13 DOA: 06/01/2020 PCP: Robyne Peers, MD   Brief Narrative:  86 year old female with history of dementia, nonobstructive CAD, history of TIA presented from assisted living facility on 6-21 with worsening mental status.  Patient was apparently recently diagnosed with UTI/cystitis on 05/31/2020 and was prescribed Cipro but was unable to take this due to worsening nausea and mental status changes.  On presentation to the ED, CTA chest was unremarkable.  CT of the abdomen showed no obvious bowel obstruction however there was a concern for possible wall thickening of the mid transverse colon as well as biliary dilatation on CT up to 1.8 cm in diameter without clear obstruction.  She was found to have elevated bilirubin, AST and ALT.  GI was consulted.  Patient was started on IV fluids and antibiotics.  Assessment & Plan:   Sepsis: Present on admission Acute cystitis Infectious/inflammatory colitis -Patient presented with altered mental status with CT of the abdomen showing concern for possible wall thickening of the mid transverse colon as well as biliary dilatation up to 1.8 cm in diameter without clear obstruction with elevated LFTs including bilirubin -Currently on cefepime and Flagyl.  Vancomycin discontinued on 06/01/2020.  Blood cultures negative so far.  Urine culture growing multiple species.  IV fluids discontinued.  If remains stable by tomorrow, will switch to oral cephalosporin and Flagyl to complete a 10-day course of therapy. -Currently afebrile with stable blood pressure. -Sepsis has resolved. -Patient got a dose of Lasix IV 20 mg on 06/05/2020 for wheezing.  Currently requiring some supplemental oxygen.  Will give 1 more dose of IV Lasix 20 mg today.  Probable new diagnosis of pancreatic head cancer with metastasis to liver with biliary  dilation Elevated LFTs -MRCP suggestive of above.  Spoke to daughter at length at bedside on 06/02/2020 and on 06/03/2020 as well and recommended that hospice would probably be the best option for her.  Palliative care has been consulted: Follow recommendations.  Patient will be followed up by hospice as an outpatient. -Overall prognosis is very poor.   -Status post ERCP and metallic stent placed in CBD by Dr. Magod/GI today.  Patient was also found to have a duodenal ulcer which was biopsied.  Follow pathology.  Continue PPI.  Acute metabolic encephalopathy in a patient with baseline dementia -Probably from sepsis.  Mental status has much improved and is probably back to her baseline mental status as per the daughter at bedside. - Delirium precautions.  Leukocytosis -Monitor  Acute kidney injury -Baseline creatinine 0.4-0.5 -Improved.  IV fluids as above  History of TIA -Resume Plavix in 2 days as per GI recommendations  Hypokalemia -Replace.  Repeat a.m. labs  DVT prophylaxis: Lovenox Code Status: DNR  family Communication: Daughter at bedside on 06/06/2020 Disposition Plan: Status is: Inpatient  Remains inpatient appropriate because:IV treatments appropriate due to intensity of illness or inability to take PO.  Status post ERCP and stenting today.  If remains stable overnight, discharge to ALF tomorrow.  Dispo: The patient is from: ALF              Anticipated d/c is to: ALF with hospice              Anticipated d/c date is: 1 day              Patient currently  is not medically stable to d/c.   Consultants: GI. palliative care  Procedures: None Antimicrobials:  Vancomycin/cefepime and Flagyl from 06/01/2020 onwards  Subjective: Patient seen and examined at bedside.  Daughter also present at bedside.  Patient is confused.  Poor historian.  Slightly drowsy today.  No worsening abdominal pain, fever, vomiting reported.  Still intermittently short of breath.   Objective: Vitals:     06/06/20 0910 06/06/20 0920 06/06/20 0930 06/06/20 1007  BP: (!) 110/37 (!) 116/43 (!) 120/42 (!) 111/47  Pulse: 65 61 63 65  Resp: (!) 22 20 19 18   Temp:    98.5 F (36.9 C)  TempSrc:    Oral  SpO2: 97% 94% 97% 96%  Weight:      Height:        Intake/Output Summary (Last 24 hours) at 06/06/2020 1051 Last data filed at 06/06/2020 0837 Gross per 24 hour  Intake 2340 ml  Output 2450 ml  Net -110 ml   Filed Weights   06/01/20 0911 06/01/20 2100 06/06/20 0719  Weight: 59 kg 62.6 kg 62.6 kg    Examination:  General exam: No distress.  Slightly drowsy today.  Wakes up and pleasantly confused.  Poor historian. Respiratory system: Bilateral decreased breath sounds at bases with some scattered crackles.  No wheezing currently.   Cardiovascular system: S1-S2 heard, rate controlled gastrointestinal system: Abdomen is nondistended, soft and nontender.  Normal bowel sounds heard  extremities: No cyanosis or clubbing.  Trace bilateral lower extremity edema present. Central nervous system: Sleepy, wakes up slightly, pleasantly confused.  No focal neurological deficits.  Moves extremities  skin: No obvious ulcers or petechiae psychiatry: Could not be assessed because of confusional state   Data Reviewed: I have personally reviewed following labs and imaging studies  CBC: Recent Labs  Lab 06/01/20 0750 06/01/20 1216 06/02/20 0312 06/03/20 0320 06/04/20 0353 06/05/20 0425 06/06/20 0443  WBC 25.4*   < > 18.9* 12.7* 12.4* 12.6* 12.5*  NEUTROABS 21.3*  --   --   --  9.6* 9.0* 8.9*  HGB 13.0   < > 10.3* 10.4* 10.3* 10.5* 10.8*  HCT 37.8   < > 30.7* 30.8* 30.3* 30.9* 32.8*  MCV 92.0   < > 91.9 92.8 91.5 92.8 94.0  PLT 396   < > 275 286 299 320 332   < > = values in this interval not displayed.   Basic Metabolic Panel: Recent Labs  Lab 06/02/20 0312 06/03/20 0320 06/04/20 0353 06/05/20 0425 06/06/20 0443  NA 140 141 140 140 137  K 3.6 3.1* 3.2* 3.4* 3.1*  CL 108 111 113* 111  106  CO2 21* 21* 21* 22 23  GLUCOSE 117* 118* 135* 128* 131*  BUN 32* 28* 21 14 13   CREATININE 0.64 0.59 0.45 0.38* 0.50  CALCIUM 8.6* 8.4* 8.3* 8.4* 8.0*  MG  --   --  2.1 2.0 1.9   GFR: Estimated Creatinine Clearance: 37.8 mL/min (by C-G formula based on SCr of 0.5 mg/dL). Liver Function Tests: Recent Labs  Lab 06/02/20 0312 06/03/20 0320 06/04/20 0353 06/05/20 0425 06/06/20 0443  AST 126* 116* 91* 69* 68*  ALT 80* 78* 75* 66* 64*  ALKPHOS 184* 192* 190* 165* 162*  BILITOT 6.9* 6.8* 7.4* 6.5* 6.5*  PROT 5.4* 5.3* 5.4* 5.3* 5.1*  ALBUMIN 2.5* 2.2* 2.1* 2.1* 2.1*   Recent Labs  Lab 06/01/20 0750  LIPASE 108*   Recent Labs  Lab 06/01/20 0750  AMMONIA 23   Coagulation Profile:  Recent Labs  Lab 06/01/20 0750  INR 1.0   Cardiac Enzymes: No results for input(s): CKTOTAL, CKMB, CKMBINDEX, TROPONINI in the last 168 hours. BNP (last 3 results) No results for input(s): PROBNP in the last 8760 hours. HbA1C: No results for input(s): HGBA1C in the last 72 hours. CBG: No results for input(s): GLUCAP in the last 168 hours. Lipid Profile: No results for input(s): CHOL, HDL, LDLCALC, TRIG, CHOLHDL, LDLDIRECT in the last 72 hours. Thyroid Function Tests: No results for input(s): TSH, T4TOTAL, FREET4, T3FREE, THYROIDAB in the last 72 hours. Anemia Panel: No results for input(s): VITAMINB12, FOLATE, FERRITIN, TIBC, IRON, RETICCTPCT in the last 72 hours. Sepsis Labs: Recent Labs  Lab 06/01/20 0750 06/01/20 0957  LATICACIDVEN 2.1* 1.8    Recent Results (from the past 240 hour(s))  Blood Culture (routine x 2)     Status: None   Collection Time: 06/01/20  7:48 AM   Specimen: BLOOD  Result Value Ref Range Status   Specimen Description   Final    BLOOD RIGHT ANTECUBITAL Performed at [facility] [address] [place], [place] [address]    Special Requests   Final    BOTTLES DRAWN AEROBIC AND ANAEROBIC Blood Culture adequate volume Performed at  [facility] [address] [place], [place] [address]    Culture   Final    NO GROWTH 5 DAYS Performed at [facility], [place] [address] [place], [place] [address]    Report Status 06/06/2020 FINAL  Final  Blood Culture (routine x 2)     Status: None   Collection Time: 06/01/20  7:48 AM   Specimen: BLOOD  Result Value Ref Range Status   Specimen Description   Final    BLOOD LEFT ANTECUBITAL Performed at [facility] [address] [place], [place] [address]    Special Requests   Final    BOTTLES DRAWN AEROBIC AND ANAEROBIC Blood Culture adequate volume Performed at [facility] [address] [place], [place] [address]    Culture   Final    NO GROWTH 5 DAYS Performed at [facility], [place] [address], [place], [place] [address]    Report Status 06/06/2020 FINAL  Final  SARS Coronavirus 2 by RT PCR (hospital order, performed in [facility] hospital lab) Nasopharyngeal Nasopharyngeal Swab     Status: None   Collection Time: 06/01/20  7:55 AM   Specimen: Nasopharyngeal Swab  Result Value Ref Range Status   SARS Coronavirus 2 NEGATIVE NEGATIVE Final    Comment: (NOTE) SARS-CoV-2 target nucleic acids are NOT DETECTED. The SARS-CoV-2 RNA is generally detectable in upper and lower respiratory specimens during the acute phase of infection. The lowest concentration of SARS-CoV-2 viral copies this assay can detect is 250 copies / mL. A negative result does not preclude SARS-CoV-2 infection and should not be used as the sole basis for treatment or other patient management decisions.  A negative result may occur with improper specimen collection / handling, submission of specimen other than nasopharyngeal swab, presence of viral mutation(s) within the areas targeted by this assay, and inadequate number of viral copies (<250 copies / mL). A negative result must be combined with clinical observations, patient history,  and epidemiological information. Fact Sheet for Patients:   StrictlyIdeas.no Fact Sheet for Healthcare Providers: BankingDealers.co.za This test is not yet approved or cleared  by the [place] FDA and has been authorized for detection and/or diagnosis of  SARS-CoV-2 by FDA under an Emergency Use Authorization (EUA).  This EUA will remain in effect (meaning this test can be used) for the duration of the COVID-19 declaration under Section 564(b)(1) of the Act, 21 U.S.C. section 360bbb-3(b)(1), unless the authorization is terminated or revoked sooner. Performed at [facility], [place] [address] [place], [place] [address]   Urine culture     Status: Abnormal   Collection Time: 06/01/20 10:48 AM   Specimen: In/Out Cath Urine  Result Value Ref Range Status   Specimen Description   Final    IN/OUT CATH URINE Performed at [facility] [address], [place], [place] [address]    Special Requests   Final    NONE Performed at [facility], [place] [address] [place], [place] [address]    Culture MULTIPLE SPECIES PRESENT, SUGGEST RECOLLECTION (A)  Final   Report Status 06/02/2020 FINAL  Final         Radiology Studies: DG CHEST PORT 1 VIEW  Result Date: 06/04/2020 CLINICAL DATA:  Shortness of breath, dyspnea, history coronary artery disease, GERD, hypertension, thoracic aortic aneurysm EXAM: PORTABLE CHEST 1 VIEW COMPARISON:  Portable exam 1858 hours compared to 06/01/2020 FINDINGS: Upper normal size of cardiac silhouette. Stable mediastinal contours with atherosclerotic calcification and tortuosity of thoracic aorta. Mitral annular calcification noted. Peribronchial thickening with perihilar infiltrates which could represent edema or infection. Minimal bibasilar atelectasis. No pleural effusion or pneumothorax. Bones demineralized. IMPRESSION: Enlargement of cardiac silhouette with mild  bibasilar atelectasis. Bronchitic changes with perihilar infiltrates question edema versus infection. Electronically Signed   By: Lavonia Dana M.D.   On: 06/04/2020 19:14   DG ERCP BILIARY & PANCREATIC DUCTS  Result Date: 06/06/2020 CLINICAL DATA:  Bile duct obstruction. EXAM: ERCP TECHNIQUE: Multiple spot images obtained with the fluoroscopic device and submitted for interpretation post-procedure. COMPARISON:  MRCP-06/01/2020 FLUOROSCOPY TIME:  2 minutes, 53 seconds FINDINGS: 23 spot fluoroscopic images of the right upper abdominal quadrant during ERCP are provided for review. Initial image demonstrates an ERCP probe overlying the right upper abdominal quadrant. Subsequent images demonstrate selective cannulation and opacification of the common bile duct which appears markedly dilated. Subsequent images demonstrate insufflation of a balloon within the distal aspect of the CBD with subsequent presumed biliary sweeping. Completion image demonstrates placement of an internal stent overlying the distal aspect of the CBD. IMPRESSION: ERCP with biliary sweeping and stent placement as above. These images were submitted for radiologic interpretation only. Please see the procedural report for the amount of contrast and the fluoroscopy time utilized. Electronically Signed   By: Sandi Mariscal M.D.   On: 06/06/2020 09:20        Scheduled Meds: . amLODipine  5 mg Oral Daily  . artificial tears   Both Eyes BID  . donepezil  10 mg Oral QHS  . enoxaparin (LOVENOX) injection  40 mg Subcutaneous Q24H  . ipratropium-albuterol  3 mL Nebulization BID  . memantine  10 mg Oral BID  . pantoprazole  40 mg Oral Daily   Continuous Infusions: . ceFEPime (MAXIPIME) IV 2 g (06/05/20 2201)  . lactated ringers 1,000 mL (06/06/20 0737)  . metronidazole 500 mg (06/06/20 0513)          Aline August, MD [facility] 06/06/2020, 10:51 AM

## 2020-06-06 NOTE — Transfer of Care (Signed)
Immediate Anesthesia Transfer of Care Note  Patient: [name]  Procedure(s) Performed: ENDOSCOPIC RETROGRADE CHOLANGIOPANCREATOGRAPHY (ERCP) WITH PROPOFOL (N/A )  Patient Location: PACU  Anesthesia Type:General  Level of Consciousness: sedated  Airway & Oxygen Therapy: Patient Spontanous Breathing and Patient connected to face mask oxygen  Post-op Assessment: Report given to RN and Post -op Vital signs reviewed and stable  Post vital signs: Reviewed and stable  Last Vitals:  Vitals Value Taken Time  BP    Temp    Pulse 71 06/06/20 0855  Resp 17 06/06/20 0855  SpO2 98 % 06/06/20 0855  Vitals shown include unvalidated device data.  Last Pain:  Vitals:   06/06/20 0719  TempSrc: Oral  PainSc: 0-No pain         Complications: No apparent anesthesia complications

## 2020-06-06 NOTE — Anesthesia Postprocedure Evaluation (Signed)
Anesthesia Post Note  Patient: [name]  Procedure(s) Performed: ENDOSCOPIC RETROGRADE CHOLANGIOPANCREATOGRAPHY (ERCP) WITH PROPOFOL (N/A ) SPHINCTEROTOMY BILIARY STENT PLACEMENT (N/A ) BIOPSY     Patient location during evaluation: PACU Anesthesia Type: General Level of consciousness: awake and alert Pain management: pain level controlled Vital Signs Assessment: post-procedure vital signs reviewed and stable Respiratory status: spontaneous breathing, nonlabored ventilation, respiratory function stable and patient connected to nasal cannula oxygen Cardiovascular status: blood pressure returned to baseline and stable Postop Assessment: no apparent nausea or vomiting Anesthetic complications: no    Last Vitals:  Vitals:   06/06/20 0906 06/06/20 0910  BP:  (!) 110/37  Pulse: 72 65  Resp: 20 (!) 22  Temp:    SpO2: 91% 97%    Last Pain:  Vitals:   06/06/20 0910  TempSrc:   PainSc: 0-No pain                 Barnet Glasgow

## 2020-06-07 ENCOUNTER — Encounter: Payer: Self-pay | Admitting: *Deleted

## 2020-06-07 LAB — COMPREHENSIVE METABOLIC PANEL
ALT: 96 U/L — ABNORMAL HIGH (ref 0–44)
AST: 188 U/L — ABNORMAL HIGH (ref 15–41)
Albumin: 2.1 g/dL — ABNORMAL LOW (ref 3.5–5.0)
Alkaline Phosphatase: 234 U/L — ABNORMAL HIGH (ref 38–126)
Anion gap: 9 (ref 5–15)
BUN: 18 mg/dL (ref 8–23)
CO2: 25 mmol/L (ref 22–32)
Calcium: 8.5 mg/dL — ABNORMAL LOW (ref 8.9–10.3)
Chloride: 105 mmol/L (ref 98–111)
Creatinine, Ser: 0.61 mg/dL (ref 0.44–1.00)
GFR calc Af Amer: >60 mL/min (ref >60)
GFR calc non Af Amer: >60 mL/min (ref >60)
Glucose, Bld: 132 mg/dL — ABNORMAL HIGH (ref 70–99)
Potassium: 3.1 mmol/L — ABNORMAL LOW (ref 3.5–5.1)
Sodium: 139 mmol/L (ref 135–145)
Total Bilirubin: 6.4 mg/dL — ABNORMAL HIGH (ref 0.3–1.2)
Total Protein: 5.3 g/dL — ABNORMAL LOW (ref 6.5–8.1)

## 2020-06-07 LAB — CBC WITH DIFFERENTIAL/PLATELET
Abs Immature Granulocytes: 0.5 10*3/uL — ABNORMAL HIGH (ref 0.00–0.07)
Basophils Absolute: 0 10*3/uL (ref 0.0–0.1)
Basophils Relative: 0 %
Eosinophils Absolute: 0.3 10*3/uL (ref 0.0–0.5)
Eosinophils Relative: 3 %
HCT: 32.9 % — ABNORMAL LOW (ref 36.0–46.0)
Hemoglobin: 10.9 g/dL — ABNORMAL LOW (ref 12.0–15.0)
Immature Granulocytes: 5 %
Lymphocytes Relative: 8 %
Lymphs Abs: 0.8 10*3/uL (ref 0.7–4.0)
MCH: 31.1 pg (ref 26.0–34.0)
MCHC: 33.1 g/dL (ref 30.0–36.0)
MCV: 93.7 fL (ref 80.0–100.0)
Monocytes Absolute: 1 10*3/uL (ref 0.1–1.0)
Monocytes Relative: 10 %
Neutro Abs: 7.9 10*3/uL — ABNORMAL HIGH (ref 1.7–7.7)
Neutrophils Relative %: 74 %
Platelets: 356 10*3/uL (ref 150–400)
RBC: 3.51 MIL/uL — ABNORMAL LOW (ref 3.87–5.11)
RDW: 18.8 % — ABNORMAL HIGH (ref 11.5–15.5)
WBC: 10.5 10*3/uL (ref 4.0–10.5)
nRBC: 0 % (ref 0.0–0.2)

## 2020-06-07 LAB — MAGNESIUM: Magnesium: 1.9 mg/dL (ref 1.7–2.4)

## 2020-06-07 LAB — SURGICAL PATHOLOGY

## 2020-06-07 MED ORDER — POTASSIUM CHLORIDE CRYS ER 20 MEQ PO TBCR
40.0000 meq | EXTENDED_RELEASE_TABLET | ORAL | Status: AC
  Administered 2020-06-07 (×2): 40 meq via ORAL
  Filled 2020-06-07 (×2): qty 2

## 2020-06-07 MED ORDER — TRAMADOL HCL 50 MG PO TABS: 50.0000 mg | ORAL_TABLET | Freq: Three times a day (TID) | ORAL | 0 refills | Status: AC | PRN

## 2020-06-07 MED ORDER — LORAZEPAM 0.5 MG PO TABS: 0.5000 mg | ORAL_TABLET | Freq: Two times a day (BID) | ORAL | 0 refills | Status: AC | PRN

## 2020-06-07 NOTE — NC FL2 (Signed)
[place] LEVEL OF CARE SCREENING TOOL     IDENTIFICATION  Patient Name: [name] Birthdate: 02-19-1926 Sex: female Admission Date (Current Location): 06/01/2020  [facility] and [place] Number:  Herbalist and Address:  [facility]  [place] [address] [place]      Provider Number: [id number]  Attending Physician Name and Address:  Aline August, MD  Relative Name and Phone Number:  Anselm Pancoast daughter [phone]    Current Level of Care: Hospital Recommended Level of Care: [place] Prior Approval Number:    Date Approved/Denied:   PASRR Number:    Discharge Plan: Other (Comment)(ALF)    Current Diagnoses: Patient Active Problem List   Diagnosis Date Noted  . Sepsis secondary to UTI ([place]) 06/01/2020  . Elevated bilirubin 06/01/2020  . CAP (community acquired pneumonia) 01/11/2020  . Sepsis ([place]) 01/10/2020  . Alzheimer's disease ([place]) 07/02/2019  . Acute lower UTI 02/10/2019  . Acute metabolic encephalopathy 91/79/1505  . Encephalopathy acute 02/10/2019  . Chest pain, rule out acute myocardial infarction 09/03/2017  . Heart palpitations 07/31/2016  . Neck pain, musculoskeletal 04/26/2015  . Variants of migraine, not elsewhere classified, without mention of intractable migraine without mention of status migrainosus 08/17/2014  . Tension headache 08/17/2014  . TIA (transient ischemic attack) 07/30/2014  . HTN (hypertension) 07/30/2014  . Aortic aneurysm, thoracic ([place]) 06/25/2014  . Chest pain 07/29/2012  . CAD (coronary artery disease) 06/03/2012  . Hypercholesteremia   . GERD (gastroesophageal reflux disease)     Orientation RESPIRATION BLADDER Height & Weight     Self, Place  Normal Incontinent, External catheter Weight: 62.6 kg Height:  5' (152.4 cm)  BEHAVIORAL SYMPTOMS/MOOD NEUROLOGICAL BOWEL NUTRITION STATUS      Incontinent Diet(Regular)  AMBULATORY STATUS COMMUNICATION  OF NEEDS Skin   Extensive Assist Verbally Normal                       Personal Care Assistance Level of Assistance  Bathing, Feeding, Dressing Bathing Assistance: Maximum assistance Feeding assistance: Independent Dressing Assistance: Maximum assistance     Functional Limitations Info  Sight, Hearing, Speech Sight Info: Adequate Hearing Info: Adequate Speech Info: Adequate    SPECIAL CARE FACTORS FREQUENCY  PT (By licensed PT), OT (By licensed OT)     PT Frequency: Eval and Treat OT Frequency: Eval and Treat            Contractures Contractures Info: Not present    Additional Factors Info  Code Status, Allergies Code Status Info: DNR Allergies Info: Sulfa Antibiotics, Aspirin, Crestor Rosuvastatin, Bactrim, Demeclocycline, Niacin And Related, Penicillins, Relafen Nabumetone, Tetracyclines & Related           Current Medications (06/07/2020):  This is the current hospital active medication list Current Facility-Administered Medications  Medication Dose Route Frequency Provider Last Rate Last Admin  . 0.9 %  sodium chloride infusion   Intravenous PRN Aline August, MD   Stopped at 06/06/20 1501  . albuterol (PROVENTIL) (2.5 MG/3ML) 0.083% nebulizer solution 2.5 mg  2.5 mg Nebulization Q4H PRN Clarene Essex, MD   2.5 mg at 06/04/20 1827  . artificial tears (LACRILUBE) ophthalmic ointment   Both Eyes BID Clarene Essex, MD   Given at 06/06/20 2138  . ceFEPIme (MAXIPIME) 2 g in sodium chloride 0.9 % 100 mL IVPB  2 g Intravenous Q12H Clarene Essex, MD 200 mL/hr at 06/06/20 2141 2 g at 06/06/20 2141  . donepezil (  ARICEPT) tablet 10 mg  10 mg Oral QHS Clarene Essex, MD   10 mg at 06/06/20 2138  . enoxaparin (LOVENOX) injection 40 mg  40 mg Subcutaneous Q24H Clarene Essex, MD   40 mg at 06/06/20 2138  . hydrocortisone (ANUSOL-HC) 2.5 % rectal cream 1 application  1 application Rectal BID PRN Clarene Essex, MD      . ipratropium-albuterol (DUONEB) 0.5-2.5 (3) MG/3ML nebulizer solution  3 mL  3 mL Nebulization BID Clarene Essex, MD   3 mL at 06/07/20 0756  . LORazepam (ATIVAN) tablet 0.5 mg  0.5 mg Oral BID PRN Clarene Essex, MD      . memantine [facility]) tablet 10 mg  10 mg Oral BID Clarene Essex, MD   10 mg at 06/06/20 2138  . metroNIDAZOLE (FLAGYL) IVPB 500 mg  500 mg Intravenous Ernestina Penna, MD 100 mL/hr at 06/07/20 0534 500 mg at 06/07/20 0534  . ondansetron (ZOFRAN) tablet 4 mg  4 mg Oral Q6H PRN Clarene Essex, MD       Or  . ondansetron [facility]) injection 4 mg  4 mg Intravenous Q6H PRN Clarene Essex, MD   4 mg at 06/06/20 0921  . pantoprazole (PROTONIX) EC tablet 40 mg  40 mg Oral Daily Clarene Essex, MD   40 mg at 06/06/20 1056  . potassium chloride SA (KLOR-CON) CR tablet 40 mEq  40 mEq Oral Q4H Alekh, Kshitiz, MD      . senna-docusate (Senokot-S) tablet 1 tablet  1 tablet Oral QHS PRN Clarene Essex, MD         Discharge Medications: Please see discharge summary for a list of discharge medications.  Relevant Imaging Results:  Relevant Lab Results:   Additional Information [phone]  Purcell Mouton, RN

## 2020-06-07 NOTE — Discharge Summary (Signed)
Physician Discharge Summary  [name] RFF:[id number] DOB: 08/20/1926 DOA: 06/01/2020  PCP: Robyne Peers, MD  Admit date: 06/01/2020 Discharge date: 06/07/2020  Admitted From: ALF Disposition: ALF with hospice  Recommendations for Outpatient Follow-up:  1. Follow up with PCP in 1 week  2. Follow-up with hospice at [facility]: No Equipment/Devices: None  Discharge Condition: Poor CODE STATUS: DNR Diet recommendation: As tolerated  Brief/Interim Summary: 86 year old female with history of dementia, nonobstructive CAD, history of TIA presented from assisted living facility on 6-21 with worsening mental status.  Patient was apparently recently diagnosed with UTI/cystitis on 05/31/2020 and was prescribed Cipro but was unable to take this due to worsening nausea and mental status changes.  On presentation to the ED, CTA chest was unremarkable.  CT of the abdomen showed no obvious bowel obstruction however there was a concern for possible wall thickening of the mid transverse colon as well as biliary dilatation on CT up to 1.8 cm in diameter without clear obstruction.  She was found to have elevated bilirubin, AST and ALT.  GI was consulted.  Patient was started on IV fluids and antibiotics. During hospitalization, family agreed for palliative biliary stent and hospice at ALF.  She had ERCP and metallic stent placed in CBD by Dr. Norva Pavlov on 06/06/2020.  She will be discharged to ALF today with hospice follow-up.   Discharge Diagnoses:   Sepsis: Present on admission Acute cystitis Infectious/inflammatory colitis -Patient presented with altered mental status with CT of the abdomen showing concern for possible wall thickening of the mid transverse colon as well as biliary dilatation up to 1.8 cm in diameter without clear obstruction with elevated LFTs including bilirubin -Currently on cefepime and Flagyl.  Vancomycin discontinued on 06/01/2020.  Blood cultures negative so far.   Urine culture growing multiple species.  IV fluids discontinued.  -Currently afebrile with stable blood pressure. -Sepsis has resolved. -Patient is currently having diarrhea.  Will DC antibiotics on discharge as family is focusing on hospice/comfort measures.  Use Imodium as needed.  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  Spoke to daughter at length at bedside l and recommended that hospice would probably be the best option for her.  Palliative care has been consulted: Family has agreed for hospice at ALF. Patient will be followed up by hospice at the ALF. -Overall prognosis is very poor.   -Status post ERCP and metallic stent placed in CBD by Dr. Norva Pavlov on 06/06/2020.  Patient was also found to have a duodenal ulcer which was biopsied.  Continue PPI. -Discharge to ALF today with hospice follow-up.  DC all nonessential medications. -Blood pressure on the lower side: Amlodipine discontinued  Acute metabolic encephalopathy in a patient with baseline dementia -Probably from sepsis.  Mental status has much improved and is probably back to her baseline mental status as per the daughter at bedside. -Continue Namenda and Aricept  Leukocytosis -Resolved  Acute kidney injury -Baseline creatinine 0.4-0.5 -Improved.  Treated with IV fluids during  the hospitalization.  History of TIA -Daughter has agreed that Plavix will not be restarted on discharge  Hypokalemia -No further blood work recommended   Discharge Instructions   Allergies as of 06/07/2020      Reactions   Sulfa Antibiotics Swelling, Other (See Comments)   Throat swelling   Aspirin Other (See Comments)   Feels like choking   Crestor [rosuvastatin] Other (See Comments)  Lower Extremity Myalgia   Bactrim Other (See Comments)   Unknown   Demeclocycline Rash   Niacin And Related Other (See Comments)   Turns red in the face    Penicillins Other (See Comments)   Unknown Reaction Per The Patient Unknown   Relafen [nabumetone] Other (See Comments)   Unknown Reaction Per The Patient Unknown   Tetracyclines & Related Rash      Medication List    STOP taking these medications   Calcium-Vitamin D 500-400 MG-UNIT Tabs   ciprofloxacin 500 MG tablet Commonly known as: CIPRO   clopidogrel 75 MG tablet Commonly known as: PLAVIX   ONE-A-DAY VITACRAVES IMMUNITY PO   polyethylene glycol powder 17 GM/SCOOP powder Commonly known as: GLYCOLAX/MIRALAX   potassium chloride SA 20 MEQ tablet Commonly known as: KLOR-CON   Vitamin D3 50 MCG (2000 UT) Tabs     TAKE these medications   acetaminophen 500 MG tablet Commonly known as: TYLENOL Take 1,000 mg by mouth in the morning, at noon, and at bedtime.   Align 4 MG Caps Take 1 capsule by mouth daily.   ANTI-DIARRHEAL PO Take 2 tablets by mouth 4 (four) times daily as needed (diarrhea).   donepezil 10 MG tablet Commonly known as: ARICEPT TAKE 1 TABLET BY MOUTH AT BEDTIME   LORazepam 0.5 MG tablet Commonly known as: ATIVAN Take 1 tablet (0.5 mg total) by mouth 2 (two) times daily as needed for anxiety or sleep.   memantine 10 MG tablet Commonly known as: NAMENDA Take 10 mg by mouth 2 (two) times daily.   nystatin-triamcinolone cream Commonly known as: MYCOLOG II Apply 1 application topically 2 (two) times daily as needed (atrophic vaginitis). Apply topically to vaginal area   ondansetron 8 MG disintegrating tablet Commonly known as: ZOFRAN-ODT Take 1 tablet by mouth every 8 (eight) hours as needed for nausea. Up to 5 days   pantoprazole 40 MG tablet Commonly known as: PROTONIX Take 1 tablet by mouth daily.   Proctozone-HC 2.5 % rectal cream Generic drug: hydrocortisone Place 1 application rectally 2 (two) times daily as needed for hemorrhoids or anal itching.   SYSTANE OP Place 2 drops into both eyes 2 (two) times daily.   traMADol 50 MG  tablet Commonly known as: ULTRAM Take 1 tablet (50 mg total) by mouth every 8 (eight) hours as needed for moderate pain or severe pain.      Follow-up Information    Robyne Peers, MD. Schedule an appointment as soon as possible for a visit in 1 week(s).   Specialty: Family Medicine Contact information: [address] [place] [address] [phone]        Hospice Follow up.   Why: at ALF at earliest convenience         Allergies  Allergen Reactions  . Sulfa Antibiotics Swelling and Other (See Comments)    Throat swelling  . Aspirin Other (See Comments)    Feels like choking  . Crestor [Rosuvastatin] Other (See Comments)    Lower Extremity Myalgia   . Bactrim Other (See Comments)    Unknown  . Demeclocycline Rash  . Niacin And Related Other (See Comments)    Turns red in the face  . Penicillins Other (See Comments)    Unknown Reaction Per The Patient Unknown  . Relafen [Nabumetone] Other (See Comments)    Unknown Reaction Per The Patient Unknown  . Tetracyclines & Related Rash    Consultations:  GI/palliative care   Procedures/Studies:  CT Angio Chest PE W and/or Wo Contrast  Result Date: 06/01/2020 CLINICAL DATA:  Abdominal pain, shortness of breath.  Jaundice EXAM: CT ANGIOGRAPHY CHEST CT ABDOMEN AND PELVIS WITH CONTRAST TECHNIQUE: Multidetector CT imaging of the chest was performed using the standard protocol during bolus administration of intravenous contrast. Multiplanar CT image reconstructions and MIPs were obtained to evaluate the vascular anatomy. Multidetector CT imaging of the abdomen and pelvis was performed using the standard protocol during bolus administration of intravenous contrast. CONTRAST:  159mL OMNIPAQUE IOHEXOL 350 MG/ML SOLN COMPARISON:  CT 01/11/2020, 06/14/2014 FINDINGS: CTA CHEST FINDINGS Cardiovascular: Satisfactory opacification of the pulmonary arteries. No filling defect to the segmental branch level to suggest  pulmonary embolism. The main pulmonary trunk is dilated measuring 3.9 cm in diameter. Mild cardiomegaly. Thoracic aortic aneurysm measuring 4.3 cm in diameter, stable from 2015. Atherosclerotic calcification of the aorta and coronary arteries. Mediastinum/Nodes: No enlarged mediastinal, hilar, or axillary lymph nodes. Thyroid gland, trachea, and esophagus demonstrate no significant findings. Small hiatal hernia. Lungs/Pleura: Mild central bronchovascular crowding. Similar degree of bronchiectasis within the right middle lobe which may reflect sequela of chronic infection. Right basilar atelectasis. No focal airspace consolidation, pleural effusion, or pneumothorax. Musculoskeletal: Mild superior endplate compression deformity of T8 with patchy sclerosis within the vertebral body and anterior fracture lucency likely a subacute fracture (series 6, images 76-80 no bony retropulsion. Chronic Schmorl's nodes at the superior endplate of T9 and T90. Review of the MIP images confirms the above findings. CT ABDOMEN and PELVIS FINDINGS Hepatobiliary: There is new moderate intra and extrahepatic biliary dilatation. Common bile duct measures up to 1.8 cm in diameter with abrupt tapering at the level of the ampulla (series 7, image 40). No definite intraluminal stone. No well-defined mass or other obstructing lesion. New 1.2 cm indeterminate density lesion within the anterior left hepatic lobe (series 4, image 19). Additional rounded 1.1 cm lesion within the right hepatic lobe measuring at the upper limits of fluid density (series 4, image 23), also new from prior. Gallbladder is moderately distended. No hyperdense gallstone. Pancreas: Borderline prominence of the pancreatic duct, increased from prior. Mild parenchymal atrophy. No definite pancreatic mass. No peripancreatic inflammatory changes. Spleen: Normal in size without focal abnormality. Adrenals/Urinary Tract: Unremarkable adrenal glands. Probable small bilateral renal  cysts. No stone or hydronephrosis. Urinary bladder unremarkable. Stomach/Bowel: Long segment diffuse wall thickening involving the ascending colon through the mid transverse colon. No pericolonic inflammatory changes. No diverticuli. Appendix not definitively identified. No pericecal inflammatory changes. No small bowel dilatation. Small hiatal hernia. Stomach appears otherwise unremarkable. Vascular/Lymphatic: Aortoiliac atherosclerosis without aneurysm. No abdominopelvic lymphadenopathy. Reproductive: Status post hysterectomy. No adnexal masses. Other: No free air, free fluid, or intra-abdominal fluid collection. Tiny fat containing umbilical hernia. Musculoskeletal: Chronic compression deformities of L1 and L3 without interval progression from CT 01/11/2020. Unchanged grade 1 anterolisthesis L5 on S1. Review of the MIP images confirms the above findings. IMPRESSION: 1. Negative for acute pulmonary embolism. 2. New moderate intra- and extrahepatic biliary dilatation. Common bile duct measures up to 1.8 cm in diameter with abrupt tapering at the level of the ampulla. No well-defined stone, mass, or other obstructing lesion. Further evaluation with MRCP is recommended. 3. Mild superior endplate compression deformity of T8 with patchy sclerosis within the vertebral body and anterior fracture lucency suggesting a subacute fracture. 4. New indeterminate density lesions within the liver, measuring up to 1.2 cm. These can be further characterized on previously recommended MRI. 5. Long segment diffuse wall thickening involving  the ascending colon through the mid transverse colon, which may reflect infectious or inflammatory colitis. 6. Stable ascending thoracic aortic aneurysm measuring up to 4.3 cm in diameter. 7. Dilated main pulmonary trunk suggesting pulmonary arterial hypertension. 8. Aortic and coronary artery atherosclerosis. (ICD10-I70.0). Electronically Signed   By: Davina Poke D.O.   On: 06/01/2020 10:14    CT ABDOMEN PELVIS W CONTRAST  Result Date: 06/01/2020 CLINICAL DATA:  Abdominal pain, shortness of breath.  Jaundice EXAM: CT ANGIOGRAPHY CHEST CT ABDOMEN AND PELVIS WITH CONTRAST TECHNIQUE: Multidetector CT imaging of the chest was performed using the standard protocol during bolus administration of intravenous contrast. Multiplanar CT image reconstructions and MIPs were obtained to evaluate the vascular anatomy. Multidetector CT imaging of the abdomen and pelvis was performed using the standard protocol during bolus administration of intravenous contrast. CONTRAST:  131mL OMNIPAQUE IOHEXOL 350 MG/ML SOLN COMPARISON:  CT 01/11/2020, 06/14/2014 FINDINGS: CTA CHEST FINDINGS Cardiovascular: Satisfactory opacification of the pulmonary arteries. No filling defect to the segmental branch level to suggest pulmonary embolism. The main pulmonary trunk is dilated measuring 3.9 cm in diameter. Mild cardiomegaly. Thoracic aortic aneurysm measuring 4.3 cm in diameter, stable from 2015. Atherosclerotic calcification of the aorta and coronary arteries. Mediastinum/Nodes: No enlarged mediastinal, hilar, or axillary lymph nodes. Thyroid gland, trachea, and esophagus demonstrate no significant findings. Small hiatal hernia. Lungs/Pleura: Mild central bronchovascular crowding. Similar degree of bronchiectasis within the right middle lobe which may reflect sequela of chronic infection. Right basilar atelectasis. No focal airspace consolidation, pleural effusion, or pneumothorax. Musculoskeletal: Mild superior endplate compression deformity of T8 with patchy sclerosis within the vertebral body and anterior fracture lucency likely a subacute fracture (series 6, images 76-80 no bony retropulsion. Chronic Schmorl's nodes at the superior endplate of T9 and Z66. Review of the MIP images confirms the above findings. CT ABDOMEN and PELVIS FINDINGS Hepatobiliary: There is new moderate intra and extrahepatic biliary dilatation. Common bile  duct measures up to 1.8 cm in diameter with abrupt tapering at the level of the ampulla (series 7, image 40). No definite intraluminal stone. No well-defined mass or other obstructing lesion. New 1.2 cm indeterminate density lesion within the anterior left hepatic lobe (series 4, image 19). Additional rounded 1.1 cm lesion within the right hepatic lobe measuring at the upper limits of fluid density (series 4, image 23), also new from prior. Gallbladder is moderately distended. No hyperdense gallstone. Pancreas: Borderline prominence of the pancreatic duct, increased from prior. Mild parenchymal atrophy. No definite pancreatic mass. No peripancreatic inflammatory changes. Spleen: Normal in size without focal abnormality. Adrenals/Urinary Tract: Unremarkable adrenal glands. Probable small bilateral renal cysts. No stone or hydronephrosis. Urinary bladder unremarkable. Stomach/Bowel: Long segment diffuse wall thickening involving the ascending colon through the mid transverse colon. No pericolonic inflammatory changes. No diverticuli. Appendix not definitively identified. No pericecal inflammatory changes. No small bowel dilatation. Small hiatal hernia. Stomach appears otherwise unremarkable. Vascular/Lymphatic: Aortoiliac atherosclerosis without aneurysm. No abdominopelvic lymphadenopathy. Reproductive: Status post hysterectomy. No adnexal masses. Other: No free air, free fluid, or intra-abdominal fluid collection. Tiny fat containing umbilical hernia. Musculoskeletal: Chronic compression deformities of L1 and L3 without interval progression from CT 01/11/2020. Unchanged grade 1 anterolisthesis L5 on S1. Review of the MIP images confirms the above findings. IMPRESSION: 1. Negative for acute pulmonary embolism. 2. New moderate intra- and extrahepatic biliary dilatation. Common bile duct measures up to 1.8 cm in diameter with abrupt tapering at the level of the ampulla. No well-defined stone, mass, or other obstructing  lesion.  Further evaluation with MRCP is recommended. 3. Mild superior endplate compression deformity of T8 with patchy sclerosis within the vertebral body and anterior fracture lucency suggesting a subacute fracture. 4. New indeterminate density lesions within the liver, measuring up to 1.2 cm. These can be further characterized on previously recommended MRI. 5. Long segment diffuse wall thickening involving the ascending colon through the mid transverse colon, which may reflect infectious or inflammatory colitis. 6. Stable ascending thoracic aortic aneurysm measuring up to 4.3 cm in diameter. 7. Dilated main pulmonary trunk suggesting pulmonary arterial hypertension. 8. Aortic and coronary artery atherosclerosis. (ICD10-I70.0). Electronically Signed   By: Davina Poke D.O.   On: 06/01/2020 10:14   MR 3D Recon At Scanner  Result Date: 06/01/2020 CLINICAL DATA:  86 year old female with history of jaundice. Abdominal pain. Shortness of breath. EXAM: MRI ABDOMEN WITHOUT AND WITH CONTRAST (INCLUDING MRCP) TECHNIQUE: Multiplanar multisequence MR imaging of the abdomen was performed both before and after the administration of intravenous contrast. Heavily T2-weighted images of the biliary and pancreatic ducts were obtained, and three-dimensional MRCP images were rendered by post processing. CONTRAST:  28mL GADAVIST GADOBUTROL 1 MMOL/ML IV SOLN COMPARISON:  No prior abdominal MRI. CT the abdomen and pelvis 06/01/2020. FINDINGS: Comment: Portions of today's examination are significantly limited by a large amount of patient respiratory motion. Lower chest: Trace right pleural effusion lying dependently. Cardiomegaly. Hepatobiliary: There are 3 small hepatic lesions which are mildly T1 hypointense, mildly T2 hyperintense, demonstrate diffusion restriction, and appear hypovascular on post gadolinium imaging, highly concerning for metastatic lesions. These lesions measure 1.4 cm in diameter in segment 2 (axial image 15 of  series 9), 1.2 cm in diameter in between segments 6 and 7 (axial image 15 of series 9), and 8 mm in segment 7 (axial image 11 of series 9). MRCP images demonstrate moderate intra and extrahepatic biliary ductal dilatation. Common bile duct measures up to 1.6 cm distally, and abruptly tapers immediately before the ampulla. No filling defect in the common bile duct to suggest choledocholithiasis. Gallbladder is moderately distended. No filling defects in the gallbladder to suggest gallstones. No gallbladder wall thickening or pericholecystic fluid to clearly indicate an acute cholecystitis at this time. Pancreas: In the right-side of the head of the pancreas extending into the pancreaticoduodenal groove slightly cephalad from the head of the pancreas there is a mass-like area which is T1 isointense, predominantly T2 hypointense and demonstrates heterogeneous enhancement (axial image 57 of series 23 and coronal image 30 of series 27) estimated to measure approximately 2.7 x 2.5 x 3.0 cm. This appears to exert some mass effect upon the distal common bile duct and the main pancreatic duct adjacent to the ampulla. MRCP images demonstrate dilatation of the main pancreatic duct which measures up to 5 mm in diameter. Several small T1 hypointense, T2 hyperintense, nonenhancing lesions are noted in the body of the pancreas, largest of which is located anteriorly measuring up to 9 mm (axial image 20 of series 9). No peripancreatic fluid collections or inflammatory changes. Spleen:  Unremarkable. Adrenals/Urinary Tract: T1 hypointense, T2 hyperintense, nonenhancing lesions in both kidneys are compatible with simple cysts, largest of which is in the upper pole of the left kidney measuring up to 1.3 cm in diameter. Urothelial thickening and hyperenhancement in the right renal pelvis and visualized portions of the proximal right ureter. No hydroureteronephrosis in the visualized portions of the abdomen. Bilateral adrenal glands are  normal in appearance. Stomach/Bowel: Visualized portions are unremarkable. Vascular/Lymphatic: Aortic atherosclerosis. No aneurysm  identified in the visualized abdominal vasculature. No lymphadenopathy noted in the visualized portions of the abdomen. Other: Small amount of T2 hyperintensity inferior to the right kidney which may reflect perinephric fluid. No significant volume of ascites noted in the visualized portions of the peritoneal cavity. Musculoskeletal: No aggressive appearing osseous lesions are noted in the visualized portions of the skeleton. IMPRESSION: 1. Mass-like area in the right lateral aspect of the head of the pancreas extending into the pancreaticoduodenal groove and slightly cephalad to the head of the pancreas, exerting mass effect upon the distal common bile duct and pancreatic duct. Mild pancreatic ductal dilatation and moderate intra and extrahepatic biliary ductal dilatation noted at this time. Findings are concerning for probable primary pancreatic neoplasm. Further evaluation with endoscopy should be considered if clinically appropriate. 2. 3 suspicious hypovascular hepatic lesions highly concerning for metastatic lesions. 3. Urothelial thickening and hyperenhancement in the right renal pelvis and proximal right ureter. Small amount of perinephric fluid. Correlation with urinalysis is recommended to exclude the possibility of upper urinary tract infection. 4. Aortic atherosclerosis. 5. Trace right pleural effusion lying dependently. Electronically Signed   By: Vinnie Langton M.D.   On: 06/01/2020 19:40   DG CHEST PORT 1 VIEW  Result Date: 06/04/2020 CLINICAL DATA:  Shortness of breath, dyspnea, history coronary artery disease, GERD, hypertension, thoracic aortic aneurysm EXAM: PORTABLE CHEST 1 VIEW COMPARISON:  Portable exam 1858 hours compared to 06/01/2020 FINDINGS: Upper normal size of cardiac silhouette. Stable mediastinal contours with atherosclerotic calcification and  tortuosity of thoracic aorta. Mitral annular calcification noted. Peribronchial thickening with perihilar infiltrates which could represent edema or infection. Minimal bibasilar atelectasis. No pleural effusion or pneumothorax. Bones demineralized. IMPRESSION: Enlargement of cardiac silhouette with mild bibasilar atelectasis. Bronchitic changes with perihilar infiltrates question edema versus infection. Electronically Signed   By: Lavonia Dana M.D.   On: 06/04/2020 19:14   DG Chest Port 1 View  Result Date: 06/01/2020 CLINICAL DATA:  Hypoxia EXAM: PORTABLE CHEST 1 VIEW COMPARISON:  01/10/2020 FINDINGS: The heart size and mediastinal contours are within normal limits. Both lungs are clear. The visualized skeletal structures are unremarkable. IMPRESSION: No active disease. Electronically Signed   By: Kathreen Devoid   On: 06/01/2020 08:30   DG ERCP BILIARY & PANCREATIC DUCTS  Result Date: 06/06/2020 CLINICAL DATA:  Bile duct obstruction. EXAM: ERCP TECHNIQUE: Multiple spot images obtained with the fluoroscopic device and submitted for interpretation post-procedure. COMPARISON:  MRCP-06/01/2020 FLUOROSCOPY TIME:  2 minutes, 53 seconds FINDINGS: 23 spot fluoroscopic images of the right upper abdominal quadrant during ERCP are provided for review. Initial image demonstrates an ERCP probe overlying the right upper abdominal quadrant. Subsequent images demonstrate selective cannulation and opacification of the common bile duct which appears markedly dilated. Subsequent images demonstrate insufflation of a balloon within the distal aspect of the CBD with subsequent presumed biliary sweeping. Completion image demonstrates placement of an internal stent overlying the distal aspect of the CBD. IMPRESSION: ERCP with biliary sweeping and stent placement as above. These images were submitted for radiologic interpretation only. Please see the procedural report for the amount of contrast and the fluoroscopy time utilized.  Electronically Signed   By: Sandi Mariscal M.D.   On: 06/06/2020 09:20   MR ABDOMEN MRCP W WO CONTAST  Result Date: 06/01/2020 CLINICAL DATA:  86 year old female with history of jaundice. Abdominal pain. Shortness of breath. EXAM: MRI ABDOMEN WITHOUT AND WITH CONTRAST (INCLUDING MRCP) TECHNIQUE: Multiplanar multisequence MR imaging of the abdomen was performed both before  and after the administration of intravenous contrast. Heavily T2-weighted images of the biliary and pancreatic ducts were obtained, and three-dimensional MRCP images were rendered by post processing. CONTRAST:  73mL GADAVIST GADOBUTROL 1 MMOL/ML IV SOLN COMPARISON:  No prior abdominal MRI. CT the abdomen and pelvis 06/01/2020. FINDINGS: Comment: Portions of today's examination are significantly limited by a large amount of patient respiratory motion. Lower chest: Trace right pleural effusion lying dependently. Cardiomegaly. Hepatobiliary: There are 3 small hepatic lesions which are mildly T1 hypointense, mildly T2 hyperintense, demonstrate diffusion restriction, and appear hypovascular on post gadolinium imaging, highly concerning for metastatic lesions. These lesions measure 1.4 cm in diameter in segment 2 (axial image 15 of series 9), 1.2 cm in diameter in between segments 6 and 7 (axial image 15 of series 9), and 8 mm in segment 7 (axial image 11 of series 9). MRCP images demonstrate moderate intra and extrahepatic biliary ductal dilatation. Common bile duct measures up to 1.6 cm distally, and abruptly tapers immediately before the ampulla. No filling defect in the common bile duct to suggest choledocholithiasis. Gallbladder is moderately distended. No filling defects in the gallbladder to suggest gallstones. No gallbladder wall thickening or pericholecystic fluid to clearly indicate an acute cholecystitis at this time. Pancreas: In the right-side of the head of the pancreas extending into the pancreaticoduodenal groove slightly cephalad from the  head of the pancreas there is a mass-like area which is T1 isointense, predominantly T2 hypointense and demonstrates heterogeneous enhancement (axial image 57 of series 23 and coronal image 30 of series 27) estimated to measure approximately 2.7 x 2.5 x 3.0 cm. This appears to exert some mass effect upon the distal common bile duct and the main pancreatic duct adjacent to the ampulla. MRCP images demonstrate dilatation of the main pancreatic duct which measures up to 5 mm in diameter. Several small T1 hypointense, T2 hyperintense, nonenhancing lesions are noted in the body of the pancreas, largest of which is located anteriorly measuring up to 9 mm (axial image 20 of series 9). No peripancreatic fluid collections or inflammatory changes. Spleen:  Unremarkable. Adrenals/Urinary Tract: T1 hypointense, T2 hyperintense, nonenhancing lesions in both kidneys are compatible with simple cysts, largest of which is in the upper pole of the left kidney measuring up to 1.3 cm in diameter. Urothelial thickening and hyperenhancement in the right renal pelvis and visualized portions of the proximal right ureter. No hydroureteronephrosis in the visualized portions of the abdomen. Bilateral adrenal glands are normal in appearance. Stomach/Bowel: Visualized portions are unremarkable. Vascular/Lymphatic: Aortic atherosclerosis. No aneurysm identified in the visualized abdominal vasculature. No lymphadenopathy noted in the visualized portions of the abdomen. Other: Small amount of T2 hyperintensity inferior to the right kidney which may reflect perinephric fluid. No significant volume of ascites noted in the visualized portions of the peritoneal cavity. Musculoskeletal: No aggressive appearing osseous lesions are noted in the visualized portions of the skeleton. IMPRESSION: 1. Mass-like area in the right lateral aspect of the head of the pancreas extending into the pancreaticoduodenal groove and slightly cephalad to the head of the  pancreas, exerting mass effect upon the distal common bile duct and pancreatic duct. Mild pancreatic ductal dilatation and moderate intra and extrahepatic biliary ductal dilatation noted at this time. Findings are concerning for probable primary pancreatic neoplasm. Further evaluation with endoscopy should be considered if clinically appropriate. 2. 3 suspicious hypovascular hepatic lesions highly concerning for metastatic lesions. 3. Urothelial thickening and hyperenhancement in the right renal pelvis and proximal right ureter. Small  amount of perinephric fluid. Correlation with urinalysis is recommended to exclude the possibility of upper urinary tract infection. 4. Aortic atherosclerosis. 5. Trace right pleural effusion lying dependently. Electronically Signed   By: Vinnie Langton M.D.   On: 06/01/2020 19:40    ERCP with metallic stent placement in CBD on 06/06/2020   Subjective: Patient seen and examined at bedside.  Pleasantly confused.  No overnight worsening shortness of breath, fever or vomiting reported by daughter or nursing staff.  Discharge Exam: Vitals:   06/07/20 0747 06/07/20 0756  BP:    Pulse:    Resp:    Temp:    SpO2: 94% 93%    General: Pt is awake but pleasantly confused.  No distress Cardiovascular: rate controlled, S1/S2 + Respiratory: bilateral decreased breath sounds at bases with some scattered crackles Abdominal: Soft, NT, ND, bowel sounds + Extremities: Trace lower extremity edema; no cyanosis    The results of significant diagnostics from this hospitalization (including imaging, microbiology, ancillary and laboratory) are listed below for reference.     Microbiology: Recent Results (from the past 240 hour(s))  Blood Culture (routine x 2)     Status: None   Collection Time: 06/01/20  7:48 AM   Specimen: BLOOD  Result Value Ref Range Status   Specimen Description   Final    BLOOD RIGHT ANTECUBITAL Performed at [facility]  [address], [place], [place] [address]    Special Requests   Final    BOTTLES DRAWN AEROBIC AND ANAEROBIC Blood Culture adequate volume Performed at [facility] [address] [place], [place] [address]    Culture   Final    NO GROWTH 5 DAYS Performed at [facility], [place] [address], [place], [place] [address]    Report Status 06/06/2020 FINAL  Final  Blood Culture (routine x 2)     Status: None   Collection Time: 06/01/20  7:48 AM   Specimen: BLOOD  Result Value Ref Range Status   Specimen Description   Final    BLOOD LEFT ANTECUBITAL Performed at [facility] [address], [place], [place] [address]    Special Requests   Final    BOTTLES DRAWN AEROBIC AND ANAEROBIC Blood Culture adequate volume Performed at [facility] [address] [place], [place] [address]    Culture   Final    NO GROWTH 5 DAYS Performed at [facility], [place] [address], [place], [place] [address]    Report Status 06/06/2020 FINAL  Final  SARS Coronavirus 2 by RT PCR (hospital order, performed in [facility] hospital lab) Nasopharyngeal Nasopharyngeal Swab     Status: None   Collection Time: 06/01/20  7:55 AM   Specimen: Nasopharyngeal Swab  Result Value Ref Range Status   SARS Coronavirus 2 NEGATIVE NEGATIVE Final    Comment: (NOTE) SARS-CoV-2 target nucleic acids are NOT DETECTED. The SARS-CoV-2 RNA is generally detectable in upper and lower respiratory specimens during the acute phase of infection. The lowest concentration of SARS-CoV-2 viral copies this assay can detect is 250 copies / mL. A negative result does not preclude SARS-CoV-2 infection and should not be used as the sole basis for treatment or other patient management decisions.  A negative result may occur with improper specimen collection / handling, submission of specimen other than nasopharyngeal swab, presence of viral mutation(s) within the areas targeted by  this assay, and inadequate number of viral copies (<250 copies /  mL). A negative result must be combined with clinical observations, patient history, and epidemiological information. Fact Sheet for Patients:   StrictlyIdeas.no Fact Sheet for Healthcare Providers: BankingDealers.co.za This test is not yet approved or cleared  by the [place] FDA and has been authorized for detection and/or diagnosis of SARS-CoV-2 by FDA under an Emergency Use Authorization (EUA).  This EUA will remain in effect (meaning this test can be used) for the duration of the COVID-19 declaration under Section 564(b)(1) of the Act, 21 U.S.C. section 360bbb-3(b)(1), unless the authorization is terminated or revoked sooner. Performed at [facility], [place] [address] [place], [place] [address]   Urine culture     Status: Abnormal   Collection Time: 06/01/20 10:48 AM   Specimen: In/Out Cath Urine  Result Value Ref Range Status   Specimen Description   Final    IN/OUT CATH URINE Performed at [facility] [address], [place], [place] [address]    Special Requests   Final    NONE Performed at [facility], [place] [address] [place], [place] [address]    Culture MULTIPLE SPECIES PRESENT, SUGGEST RECOLLECTION (A)  Final   Report Status 06/02/2020 FINAL  Final     Labs: BNP (last 3 results) Recent Labs    06/01/20 0750  BNP 024.0*   Basic Metabolic Panel: Recent Labs  Lab 06/03/20 0320 06/04/20 0353 06/05/20 0425 06/06/20 0443 06/07/20 0530  NA 141 140 140 137 139  K 3.1* 3.2* 3.4* 3.1* 3.1*  CL 111 113* 111 106 105  CO2 21* 21* 22 23 25   GLUCOSE 118* 135* 128* 131* 132*  BUN 28* 21 14 13 18   CREATININE 0.59 0.45 0.38* 0.50 0.61  CALCIUM 8.4* 8.3* 8.4* 8.0* 8.5*  MG  --  2.1 2.0 1.9 1.9   Liver Function Tests: Recent Labs  Lab 06/03/20 0320 06/04/20 0353 06/05/20 0425 06/06/20 0443  06/07/20 0530  AST 116* 91* 69* 68* 188*  ALT 78* 75* 66* 64* 96*  ALKPHOS 192* 190* 165* 162* 234*  BILITOT 6.8* 7.4* 6.5* 6.5* 6.4*  PROT 5.3* 5.4* 5.3* 5.1* 5.3*  ALBUMIN 2.2* 2.1* 2.1* 2.1* 2.1*   Recent Labs  Lab 06/01/20 0750  LIPASE 108*   Recent Labs  Lab 06/01/20 0750  AMMONIA 23   CBC: Recent Labs  Lab 06/01/20 0750 06/01/20 1216 06/03/20 0320 06/04/20 0353 06/05/20 0425 06/06/20 0443 06/07/20 0530  WBC 25.4*   < > 12.7* 12.4* 12.6* 12.5* 10.5  NEUTROABS 21.3*  --   --  9.6* 9.0* 8.9* 7.9*  HGB 13.0   < > 10.4* 10.3* 10.5* 10.8* 10.9*  HCT 37.8   < > 30.8* 30.3* 30.9* 32.8* 32.9*  MCV 92.0   < > 92.8 91.5 92.8 94.0 93.7  PLT 396   < > 286 299 320 332 356   < > = values in this interval not displayed.   Cardiac Enzymes: No results for input(s): CKTOTAL, CKMB, CKMBINDEX, TROPONINI in the last 168 hours. BNP: Invalid input(s): POCBNP CBG: No results for input(s): GLUCAP in the last 168 hours. D-Dimer No results for input(s): DDIMER in the last 72 hours. Hgb A1c No results for input(s): HGBA1C in the last 72 hours. Lipid Profile No results for input(s): CHOL, HDL, LDLCALC, TRIG, CHOLHDL, LDLDIRECT in the last 72 hours. Thyroid function studies No results for input(s): TSH, T4TOTAL, T3FREE, THYROIDAB in the last 72 hours.  Invalid input(s): FREET3 Anemia work up No results  for input(s): VITAMINB12, FOLATE, FERRITIN, TIBC, IRON, RETICCTPCT in the last 72 hours. Urinalysis    Component Value Date/Time   COLORURINE AMBER (A) 06/01/2020 1048   APPEARANCEUR HAZY (A) 06/01/2020 1048   LABSPEC 1.040 (H) 06/01/2020 1048   PHURINE 6.0 06/01/2020 1048   GLUCOSEU NEGATIVE 06/01/2020 1048   HGBUR SMALL (A) 06/01/2020 1048   BILIRUBINUR NEGATIVE 06/01/2020 1048   BILIRUBINUR neg 03/07/2015 1506   KETONESUR NEGATIVE 06/01/2020 1048   PROTEINUR 30 (A) 06/01/2020 1048   UROBILINOGEN 0.2 03/07/2015 1506   UROBILINOGEN 0.2 07/30/2014 1236   NITRITE POSITIVE  (A) 06/01/2020 1048   LEUKOCYTESUR LARGE (A) 06/01/2020 1048   Sepsis Labs Invalid input(s): PROCALCITONIN,  WBC,  LACTICIDVEN Microbiology Recent Results (from the past 240 hour(s))  Blood Culture (routine x 2)     Status: None   Collection Time: 06/01/20  7:48 AM   Specimen: BLOOD  Result Value Ref Range Status   Specimen Description   Final    BLOOD RIGHT ANTECUBITAL Performed at [facility], [place] [address], [place], [place] [address]    Special Requests   Final    BOTTLES DRAWN AEROBIC AND ANAEROBIC Blood Culture adequate volume Performed at [facility] [address] [place], [place] [address]    Culture   Final    NO GROWTH 5 DAYS Performed at [facility], [place] [address], [place], [place] [address]    Report Status 06/06/2020 FINAL  Final  Blood Culture (routine x 2)     Status: None   Collection Time: 06/01/20  7:48 AM   Specimen: BLOOD  Result Value Ref Range Status   Specimen Description   Final    BLOOD LEFT ANTECUBITAL Performed at [facility] [address] [place], [place] [address]    Special Requests   Final    BOTTLES DRAWN AEROBIC AND ANAEROBIC Blood Culture adequate volume Performed at [place] [address] [place], [place] [address]    Culture   Final    NO GROWTH 5 DAYS Performed at [facility], [place] [address] [place],  [address]    Report Status 06/06/2020 FINAL  Final  SARS Coronavirus 2 by RT PCR (hospital order, performed in [facility] hospital lab) Nasopharyngeal Nasopharyngeal Swab     Status: None   Collection Time: 06/01/20  7:55 AM   Specimen: Nasopharyngeal Swab  Result Value Ref Range Status   SARS Coronavirus 2 NEGATIVE NEGATIVE Final    Comment: (NOTE) SARS-CoV-2 target nucleic acids are NOT DETECTED. The SARS-CoV-2 RNA is generally detectable in upper and lower respiratory specimens during the acute phase of infection. The  lowest concentration of SARS-CoV-2 viral copies this assay can detect is 250 copies / mL. A negative result does not preclude SARS-CoV-2 infection and should not be used as the sole basis for treatment or other patient management decisions.  A negative result may occur with improper specimen collection / handling, submission of specimen other than nasopharyngeal swab, presence of viral mutation(s) within the areas targeted by this assay, and inadequate number of viral copies (<250 copies / mL). A negative result must be combined with clinical observations, patient history, and epidemiological information. Fact Sheet for Patients:   StrictlyIdeas.no Fact Sheet for Healthcare Providers: BankingDealers.co.za This test is not yet approved or cleared  by the [place] FDA and has been authorized for detection and/or diagnosis of SARS-CoV-2 by FDA under an Emergency Use  Authorization (EUA).  This EUA will remain in effect (meaning this test can be used) for the duration of the COVID-19 declaration under Section 564(b)(1) of the Act, 21 U.S.C. section 360bbb-3(b)(1), unless the authorization is terminated or revoked sooner. Performed at [facility], [place] [address], [place], [place] [address]   Urine culture     Status: Abnormal   Collection Time: 06/01/20 10:48 AM   Specimen: In/Out Cath Urine  Result Value Ref Range Status   Specimen Description   Final    IN/OUT CATH URINE Performed at [facility] [address], [place], [place] [address]    Special Requests   Final    NONE Performed at [facility], [place] [address], [place], [place] [address]    Culture MULTIPLE SPECIES PRESENT, SUGGEST RECOLLECTION (A)  Final   Report Status 06/02/2020 FINAL  Final     Time coordinating discharge: 35 minutes  SIGNED:   Aline August, MD  [facility] 06/07/2020, 9:40  AM

## 2020-06-07 NOTE — TOC Progression Note (Signed)
Transition of Care [facility]) - Progression Note    Patient Details  Name: [name] MRN: [id number] Date of Birth: 10-Jul-1926  Transition of Care [facility]) CM/SW Contact  Purcell Mouton, RN Phone Number: 06/07/2020, 2:44 PM  Clinical Narrative:    Pt will discharge back to [facility] with [facility]. [facility] will supply all DME for pt. Spoke with Admission Coordinator x3 today. Daughter will transport pt to ALF. DNR given to daughter.    Expected Discharge Plan: Assisted Living Barriers to Discharge: No Barriers Identified  Expected Discharge Plan and Services Expected Discharge Plan: Assisted Living   Discharge Planning Services: CM Consult   Living arrangements for the past 2 months: Single Family Home Expected Discharge Date: 06/07/20                                     Social Determinants of Health (SDOH) Interventions    Readmission Risk Interventions No flowsheet data found.

## 2020-06-07 NOTE — Care Management Important Message (Signed)
Important Message  Patient Details IM Letter given to Gabriel Earing RN Case Manager to present to the Patient Name: [name] MRN: [id number] Date of Birth: [birth year]   Medicare Important Message Given:  Yes     [name] 06/07/2020, 11:10 AM

## 2020-06-07 NOTE — Progress Notes (Signed)
[facility])  Request received for O2 to be delivered.  This was added to the previously requested DME.  Thank you, Venia Carbon RN, BSN, [facility] Liaison

## 2020-06-07 NOTE — Progress Notes (Signed)
[name] 10:31 AM  Subjective: Patient doing well from her ERCP and has no complaints and her case discussed with her daughter and she wants to go home  Objective: Vital signs stable afebrile no acute distress abdomen is soft nontender LFTs actually slight increase  Assessment: Status post ERCP and stenting  Plan: Okay with me to go home with repeat lab work either later this week or early next week depending on clinical course and it can be done by a visiting nurse or at her primary care's office or in my office and since she has had a week of antibiotics I think it is okay to stop her antibiotics and we answered all of the daughter's questions and please call me if I can be of any further assistance with this hospital stay and I discussed her case with her primary gastroenterologist Dr. Cristina Gong as well  [facility]  office [phone] After 5PM or if no answer call [phone]

## 2020-06-07 NOTE — Progress Notes (Signed)
Nsg Discharge Note  Admit Date:  06/01/2020 Discharge date: 06/07/2020   [name] to be D/C'd to ALF with [facility]. per MD order.  AVS completed. Patient/caregiver able to verbalize understanding.  Discharge Medication: Allergies as of 06/07/2020      Reactions   Sulfa Antibiotics Swelling, Other (See Comments)   Throat swelling   Aspirin Other (See Comments)   Feels like choking   Crestor [rosuvastatin] Other (See Comments)   Lower Extremity Myalgia   Bactrim Other (See Comments)   Unknown   Demeclocycline Rash   Niacin And Related Other (See Comments)   Turns red in the face   Penicillins Other (See Comments)   Unknown Reaction Per The Patient Unknown   Relafen [nabumetone] Other (See Comments)   Unknown Reaction Per The Patient Unknown   Tetracyclines & Related Rash      Medication List    STOP taking these medications   Calcium-Vitamin D 500-400 MG-UNIT Tabs   ciprofloxacin 500 MG tablet Commonly known as: CIPRO   clopidogrel 75 MG tablet Commonly known as: PLAVIX   ONE-A-DAY VITACRAVES IMMUNITY PO   polyethylene glycol powder 17 GM/SCOOP powder Commonly known as: GLYCOLAX/MIRALAX   potassium chloride SA 20 MEQ tablet Commonly known as: KLOR-CON   Vitamin D3 50 MCG (2000 UT) Tabs     TAKE these medications   acetaminophen 500 MG tablet Commonly known as: TYLENOL Take 1,000 mg by mouth in the morning, at noon, and at bedtime.   Align 4 MG Caps Take 1 capsule by mouth daily.   ANTI-DIARRHEAL PO Take 2 tablets by mouth 4 (four) times daily as needed (diarrhea).   donepezil 10 MG tablet Commonly known as: ARICEPT TAKE 1 TABLET BY MOUTH AT BEDTIME   LORazepam 0.5 MG tablet Commonly known as: ATIVAN Take 1 tablet (0.5 mg total) by mouth 2 (two) times daily as needed for anxiety or sleep.   memantine 10 MG tablet Commonly known as: NAMENDA Take 10 mg by mouth 2 (two) times daily.   nystatin-triamcinolone cream Commonly known as:  MYCOLOG II Apply 1 application topically 2 (two) times daily as needed (atrophic vaginitis). Apply topically to vaginal area   ondansetron 8 MG disintegrating tablet Commonly known as: ZOFRAN-ODT Take 1 tablet by mouth every 8 (eight) hours as needed for nausea. Up to 5 days   pantoprazole 40 MG tablet Commonly known as: PROTONIX Take 1 tablet by mouth daily.   Proctozone-HC 2.5 % rectal cream Generic drug: hydrocortisone Place 1 application rectally 2 (two) times daily as needed for hemorrhoids or anal itching.   SYSTANE OP Place 2 drops into both eyes 2 (two) times daily.   traMADol 50 MG tablet Commonly known as: ULTRAM Take 1 tablet (50 mg total) by mouth every 8 (eight) hours as needed for moderate pain or severe pain.       Discharge Assessment: Vitals:   06/07/20 0756 06/07/20 1322  BP:  122/61  Pulse:  72  Resp:  18  Temp:  98.3 F (36.8 C)  SpO2: 93% 92%   Skin clean, dry and intact without evidence of skin break down, no evidence of skin tears noted. IV catheter discontinued intact. Site without signs and symptoms of complications - no redness or edema noted at insertion site, patient denies c/o pain - only slight tenderness at site.  Dressing with slight pressure applied.  D/c Instructions-Education: Discharge instructions given to patient/family with verbalized understanding. D/c education completed with patient/family including follow  up instructions, medication list, d/c activities limitations if indicated, with other d/c instructions as indicated by MD - patient able to verbalize understanding, all questions fully answered. Patient instructed to return to ED, call 911, or call MD for any changes in condition.  Patient escorted via [place], and D/C to ALF with [facility] via private auto.  Atilano Ina, RN 06/07/2020 2:48 PM

## 2020-06-08 DIAGNOSIS — Z20828 Contact with and (suspected) exposure to other viral communicable diseases: Secondary | ICD-10-CM | POA: Diagnosis not present

## 2020-06-08 DIAGNOSIS — Z1159 Encounter for screening for other viral diseases: Secondary | ICD-10-CM | POA: Diagnosis not present

## 2020-06-08 LAB — CANCER ANTIGEN 19-9: CA 19-9: 2645 U/mL — ABNORMAL HIGH (ref 0–35)

## 2020-06-13 DIAGNOSIS — R4182 Altered mental status, unspecified: Secondary | ICD-10-CM | POA: Diagnosis not present

## 2020-06-13 DIAGNOSIS — E876 Hypokalemia: Secondary | ICD-10-CM | POA: Diagnosis not present

## 2020-06-13 DIAGNOSIS — R6510 Systemic inflammatory response syndrome (SIRS) of non-infectious origin without acute organ dysfunction: Secondary | ICD-10-CM | POA: Diagnosis not present

## 2020-06-22 DIAGNOSIS — Z20828 Contact with and (suspected) exposure to other viral communicable diseases: Secondary | ICD-10-CM | POA: Diagnosis not present

## 2020-06-22 DIAGNOSIS — Z1159 Encounter for screening for other viral diseases: Secondary | ICD-10-CM | POA: Diagnosis not present

## 2020-06-29 DIAGNOSIS — Z20828 Contact with and (suspected) exposure to other viral communicable diseases: Secondary | ICD-10-CM | POA: Diagnosis not present

## 2020-06-29 DIAGNOSIS — Z1159 Encounter for screening for other viral diseases: Secondary | ICD-10-CM | POA: Diagnosis not present

## 2020-07-06 DIAGNOSIS — Z1159 Encounter for screening for other viral diseases: Secondary | ICD-10-CM | POA: Diagnosis not present

## 2020-07-06 DIAGNOSIS — Z20828 Contact with and (suspected) exposure to other viral communicable diseases: Secondary | ICD-10-CM | POA: Diagnosis not present

## 2020-07-11 ENCOUNTER — Encounter: Payer: Self-pay | Admitting: Adult Health

## 2020-07-11 ENCOUNTER — Other Ambulatory Visit: Payer: Self-pay

## 2020-07-11 ENCOUNTER — Ambulatory Visit: Payer: PPO | Admitting: Adult Health

## 2020-07-11 VITALS — BP 122/71 | HR 87 | Ht 60.0 in | Wt 121.0 lb

## 2020-07-11 DIAGNOSIS — F0280 Dementia in other diseases classified elsewhere without behavioral disturbance: Secondary | ICD-10-CM

## 2020-07-11 DIAGNOSIS — G301 Alzheimer's disease with late onset: Secondary | ICD-10-CM

## 2020-07-11 MED ORDER — DONEPEZIL HCL 10 MG PO TABS: 10.0000 mg | ORAL_TABLET | Freq: Every day | ORAL | 3 refills | Status: AC

## 2020-07-11 MED ORDER — MEMANTINE HCL 10 MG PO TABS: 10.0000 mg | ORAL_TABLET | Freq: Two times a day (BID) | ORAL | 3 refills | Status: AC

## 2020-07-11 NOTE — Progress Notes (Signed)
[facility] [address] [place]. [place] [address] [phone]       OFFICE FOLLOW-UP NOTE  [name] Date of Birth:  May 12, 1926 Medical Record Number:  [id number]   Chief complaint: Chief Complaint  Karen presents with  . Follow-up    Rm 9 pt here for a f/u on alzheimers.      HPI:   Today, 07/11/2020, [name] for 30-month follow-up accompanied by her daughter.  Since prior Turner, she has had recent hospitalization on 06/01/2020 for sepsis with acute cystitis, probable new diagnosis of pancreatic head cancer with mets to liver and acute metabolic encephalopathy.  She was discharged to [facility] with hospice.  She continues to be followed by hospice team.  She continues on Namenda and Aricept tolerating well.  Memory loss/cognition acutely worsened during recent admission but has since returned to baseline.  No behavioral related concerns but daughter does report concern for depression/anxiety.  Daughter reports continued use of Aricept and Namenda due to increased confusion when she does not take them. She is currently transports via wheelchair and is able to stand/pivot for transfers.  No concerns at this time.    History copied from prior office Turner with Dr. Leonie Man for reference purposes only Update 10/19/2019: She Turner for follow-up after last Turner 2 months ago.  She is accompanied by her daughter.  She has been able to tolerate Namenda and Aricept well without significant side effects.  They have noticed some improvement in her cognition and memory.  She is not confused as much.  She is mostly independent and does most activities of daily living for herself.  She is more oriented to time and not as disoriented.  She is able to dress herself and participate in group activities as well as therapies and exercise class.  She has benefited with physical and occupational therapy and is now able to ambulate with a walker.  She  walks several times a day and has had no interim falls.  She has no new complaints today.  She does not have any delusions, hallucinations or unsafe behavior.  Update 08/09/2009 : [name] on 07/02/2019.  She is accompanied by her daughter.  At that Turner I had ordered lab work for reversible causes of memory loss which was done on 08/06/2019 and vitamin B12, TSH, RPR and homocystine were normal.  EEG was ordered but had to be rescheduled due to delays due to to coronavirus pandemic and has not yet been done.  MRI scan of the brain done on 08/03/2019 shows moderate degree of generalized cerebral atrophy and mild degree of changes of age-appropriate chronic small vessel disease.  No acute abnormalities.  Karen was started on Aricept which she initially had some GI side effects but after a few weeks is tolerating better she is currently on 10 mg daily.  Daughter feels she is noticed slight improvement in her memory and her cognitive abilities.  She still has some good days and bad days where she gets quite confused and disoriented.  Karen had a fall 2 days ago and has hurt her right shoulder and has suspected fracture.  She has an appointment to see orthopedic surgeon tomorrow.  Karen still living in assisted living facility and is hoping to get all her medical test done in the next few days including EEG.  She has no new complaints.  Initial virtual video consultation 07/02/2019 :[name] is  a 86 year old pleasant Caucasian lady seen today for initial virtual video office consultation Turner for dementia.  She is accompanied by her daughter throughout this Turner who facilitates it.  History is obtained from her and review of referral notes and electronic medical records.  I personally reviewed imaging films in PACS.  [name] is a pleasant 86 year old Caucasian lady who as per the daughter has had progressive cognitive decline since January 2020.   She used to get very occasional episodes of confusion in the setting of bladder infections and disorientation.  But gradually this seem to be getting worse.  She is currently living in assisted living facility since December 2018.  Over the last several months after the lockdown from the coronavirus pandemic she is significantly worse.  She has more episodic confusion and sudden onset of behavioral change as if a light switch is flipped she may act confused and disoriented for a couple of hours and then improved and be much calmer.  She was seen in the hospital in March this year with suspected urinary tract infection and was noted as having significant sundowning at that time.  Review of lab work from March show that comprehensive metabolic panel and CBC were normal.  CT scan of the head was obtained which I personally reviewed shows mild age-related changes of generalized atrophy and small vessel disease.  No acute abnormalities.  There are days when she is unable to recognize her daughter of the day she is fine.  She is also noticed difficulties in remembering day-to-day activities and needs increasing help from her daughter.  She has occasional headaches but these are not severe or bothersome.  She is also noticed some dizziness and imbalance she is had a few minor falls but no injuries.  There is no family stable tremors dementia.  Karen has no history of loss of consciousness, significant head injury, seizures or strokes.  She did have 2 TIAs in the remote past greater than 5 years ago but she is unable to provide any details.  She was previously on Celexa and Ativan as needed for a while but these medications have since been stopped without any improvement in her cognition or confusion.    ROS:   14 system review of systems is positive for memory loss, confusion, weakness, depression, anxiety and all other systems negative  PMH:  Past Medical History:  Diagnosis Date  . Aortic aneurysm, thoracic  (HCC)    4.4 cm  . Aortic dilatation ([facility])    Seen on cath 2005  . Arthritis   . CAD (coronary artery disease)    a. Nonobstructive by last cath in 05/2004 with 30% LAD. b. EF 65-70% 12/2010. c. Hx ASA intolerance.   . Colitis    12/2010 associated with transient lower GI bleeding/anemia tx with abx  . GERD (gastroesophageal reflux disease)    a. Minimal hiatal hernia on EGD 2006, no obvious esophageal source for CP at that time.  . Hypercholesteremia    Hx of myalgias felt to be statin related but taking Crestor as OP  . Osteoporosis   . Stroke [facility])    TIA    Social History:  Social History   Socioeconomic History  . Marital status: Widowed    Spouse name: Not on file  . Number of children: 2  . Years of education: college   . Highest education level: Not on file  Occupational History  . Occupation: retired   Tobacco Use  . Smoking status:  Former Smoker    Quit date: 04/26/1959    Years since quitting: 61.2  . Smokeless tobacco: Never Used  Vaping Use  . Vaping Use: Never used  Substance and Sexual Activity  . Alcohol use: No    Alcohol/week: 1.0 standard drink    Types: 1 Glasses of wine per week  . Drug use: No  . Sexual activity: Not Currently    Birth control/protection: Abstinence  Other Topics Concern  . Not on file  Social History Narrative   Karen is right handed   Karen drinks coffee daily   Social Determinants of Health   Financial Resource Strain:   . Difficulty of Paying Living Expenses:   Food Insecurity:   . Worried About Charity fundraiser in the Last Year:   . Arboriculturist in the Last Year:   Transportation Needs:   . Film/video editor (Medical):   Marland Kitchen Lack of Transportation (Non-Medical):   Physical Activity:   . Days of Exercise per Week:   . Minutes of Exercise per Session:   Stress:   . Feeling of Stress :   Social Connections:   . Frequency of Communication with Friends and Family:   . Frequency of Social Gatherings with  Friends and Family:   . Attends Religious Services:   . Active Member of Clubs or Organizations:   . Attends Archivist Meetings:   Marland Kitchen Marital Status:   Intimate Partner Violence:   . Fear of Current or Ex-Partner:   . Emotionally Abused:   Marland Kitchen Physically Abused:   . Sexually Abused:     Medications:   Current Outpatient Medications on File Prior to Turner  Medication Sig Dispense Refill  . acetaminophen (TYLENOL) 500 MG tablet Take 1,000 mg by mouth in the morning, at noon, and at bedtime.     . Loperamide HCl (ANTI-DIARRHEAL PO) Take 2 tablets by mouth 4 (four) times daily as needed (diarrhea).    . LORazepam (ATIVAN) 0.5 MG tablet Take 1 tablet (0.5 mg total) by mouth 2 (two) times daily as needed for anxiety or sleep. 10 tablet 0  . nystatin-triamcinolone (MYCOLOG II) cream Apply 1 application topically 2 (two) times daily as needed (atrophic vaginitis). Apply topically to vaginal area    . ondansetron (ZOFRAN-ODT) 8 MG disintegrating tablet Take 1 tablet by mouth every 8 (eight) hours as needed for nausea. Up to 5 days    . pantoprazole (PROTONIX) 40 MG tablet Take 1 tablet by mouth daily.    Vladimir Faster Glycol-Propyl Glycol (SYSTANE OP) Place 2 drops into both eyes 2 (two) times daily.     . Probiotic Product (ALIGN) 4 MG CAPS Take 1 capsule by mouth daily.    Marland Kitchen PROCTOZONE-HC 2.5 % rectal cream Place 1 application rectally 2 (two) times daily as needed for hemorrhoids or anal itching.     . traMADol (ULTRAM) 50 MG tablet Take 1 tablet (50 mg total) by mouth every 8 (eight) hours as needed for moderate pain or severe pain. (Karen not taking: Reported on 07/11/2020) 14 tablet 0   No current facility-administered medications on file prior to Turner.    Allergies:   Allergies  Allergen Reactions  . Sulfa Antibiotics Swelling and Other (See Comments)    Throat swelling  . Aspirin Other (See Comments)    Feels like choking  . Crestor [Rosuvastatin] Other (See Comments)     Lower Extremity Myalgia   . Bactrim Other (See Comments)  Unknown  . Demeclocycline Rash  . Niacin And Related Other (See Comments)    Turns red in the face  . Penicillins Other (See Comments)    Unknown Reaction Per The Karen Unknown  . Relafen [Nabumetone] Other (See Comments)    Unknown Reaction Per The Karen Unknown  . Tetracyclines & Related Rash     Vitals Today's Vitals   07/11/20 1233  BP: 122/71  Pulse: 87  Weight: 121 lb (54.9 kg)  Height: 5' (1.524 m)   Body mass index is 23.63 kg/m.   Physical Exam General: Frail elderly pleasant Caucasian lady, seated, in no evident distress Head: head normocephalic and atraumatic.  Neck: supple with no carotid or supraclavicular bruits Cardiovascular: regular rate and rhythm, no murmurs Musculoskeletal: no deformity Skin:  no rash/petichiae Vascular:  Normal pulses all extremities  Neurologic Exam Mental Status: Awake and fully alert. Oriented to place and month (disoriented to year). Recent and remote memory poor . Attention span, concentration and fund of knowledge diminished.   Mood and affect appropriate.  MMSE not completed Cranial Nerves:  Pupils equal, briskly reactive to light. Extraocular movements full without nystagmus. Visual fields full to confrontation. Hearing diminished slightly bilaterally.. Facial sensation intact. Face, tongue, palate moves normally and symmetrically.  Motor: Normal bulk and tone. Normal strength in all tested extremity muscles except bilateral lower extremity hip flexor weakness Sensory.: intact to touch ,pinprick .position and vibratory sensation.  Coordination: Rapid alternating movements normal in all extremities. Finger-to-nose performed accurately bilaterally and heel-to-shin unable to test due to bilateral hip flexor weakness. Gait and Station: Deferred as Karen is nonambulatory Reflexes: 1+ and symmetric. Toes downgoing.     ASSESSMENT/PLAN: 86 year old pleasant  Caucasian lady with history of TIAs and Alzheimer's dementia.  She recently has been transitioned to ALF hospice after recent admission and finding of likely pancreatic cancer with mets to liver.   Alzheimer's dementia -MMSE not completed as Karen currently in hospice -Stable per daughter without behavioral concerns -Continue Aricept 10 mg nightly and Namenda 10 mg twice daily per daughter request due to worsening confusion off medications.  Refills provided.  Deconditioning -Likely due to recent admission -Advised daughter to speak to hospice team in regards to participating in physical therapy as Karen is frustrated and increased depression due to inability to ambulate -Advised daughter to speak with hospice team in regards to possible need of depression treatment/management   As Karen currently on hospice, recommend follow-up on an as-needed basis which daughter is in agreement with  I spent 20 minutes of face-to-face and non-face-to-face time with Karen and daughter.  This included previsit chart review, lab review, study review, order entry, electronic health record documentation, Karen education regarding Alzheimer's dementia, deconditioning, depression and answered all questions to Karen and daughter satisfaction   Frann Rider, AGNP-BC  [facility] [address] [place], [place] [address]  Phone [phone] Fax [phone] Note: This document was prepared with digital dictation and possible smart phrase technology. Any transcriptional errors that result from this process are unintentional.

## 2020-07-11 NOTE — Progress Notes (Signed)
I agree with the above plan 

## 2020-07-11 NOTE — Patient Instructions (Addendum)
Your Plan:  Continue Aricept 10 mg nightly and Namenda 10 mg twice daily - refills provided    Recommend follow-up on an as-needed basis at this time but please do not hesitate to call with any questions or concerns      Thank you for coming to see [place] at [facility]. I hope we have been able to provide you high quality care today.  You may receive a patient satisfaction survey over the next few weeks. We would appreciate your feedback and comments so that we may continue to improve ourselves and the health of our patients.

## 2020-07-13 DIAGNOSIS — Z1159 Encounter for screening for other viral diseases: Secondary | ICD-10-CM | POA: Diagnosis not present

## 2020-07-13 DIAGNOSIS — Z20828 Contact with and (suspected) exposure to other viral communicable diseases: Secondary | ICD-10-CM | POA: Diagnosis not present

## 2020-07-15 ENCOUNTER — Emergency Department (HOSPITAL_COMMUNITY)

## 2020-07-15 ENCOUNTER — Other Ambulatory Visit: Payer: Self-pay

## 2020-07-15 ENCOUNTER — Inpatient Hospital Stay (HOSPITAL_COMMUNITY)
Admission: EM | Admit: 2020-07-15 | Discharge: 2020-07-18 | DRG: 871 | Disposition: A | Discharge status: Hospice | Source: Skilled Nursing Facility | Attending: Internal Medicine | Admitting: Internal Medicine

## 2020-07-15 ENCOUNTER — Encounter (HOSPITAL_COMMUNITY): Payer: Self-pay | Admitting: Internal Medicine

## 2020-07-15 DIAGNOSIS — C259 Malignant neoplasm of pancreas, unspecified: Secondary | ICD-10-CM

## 2020-07-15 DIAGNOSIS — H02401 Unspecified ptosis of right eyelid: Secondary | ICD-10-CM | POA: Diagnosis not present

## 2020-07-15 DIAGNOSIS — E876 Hypokalemia: Secondary | ICD-10-CM | POA: Diagnosis present

## 2020-07-15 DIAGNOSIS — I5032 Chronic diastolic (congestive) heart failure: Secondary | ICD-10-CM | POA: Diagnosis not present

## 2020-07-15 DIAGNOSIS — H5347 Heteronymous bilateral field defects: Secondary | ICD-10-CM | POA: Diagnosis not present

## 2020-07-15 DIAGNOSIS — R319 Hematuria, unspecified: Secondary | ICD-10-CM | POA: Diagnosis present

## 2020-07-15 DIAGNOSIS — I639 Cerebral infarction, unspecified: Secondary | ICD-10-CM | POA: Diagnosis present

## 2020-07-15 DIAGNOSIS — R0902 Hypoxemia: Secondary | ICD-10-CM | POA: Diagnosis not present

## 2020-07-15 DIAGNOSIS — Z20822 Contact with and (suspected) exposure to covid-19: Secondary | ICD-10-CM | POA: Diagnosis present

## 2020-07-15 DIAGNOSIS — N390 Urinary tract infection, site not specified: Secondary | ICD-10-CM | POA: Diagnosis present

## 2020-07-15 DIAGNOSIS — A4151 Sepsis due to Escherichia coli [E. coli]: Secondary | ICD-10-CM | POA: Diagnosis not present

## 2020-07-15 DIAGNOSIS — C787 Secondary malignant neoplasm of liver and intrahepatic bile duct: Secondary | ICD-10-CM | POA: Diagnosis not present

## 2020-07-15 DIAGNOSIS — C250 Malignant neoplasm of head of pancreas: Secondary | ICD-10-CM | POA: Diagnosis not present

## 2020-07-15 DIAGNOSIS — M4854XA Collapsed vertebra, not elsewhere classified, thoracic region, initial encounter for fracture: Secondary | ICD-10-CM | POA: Diagnosis present

## 2020-07-15 DIAGNOSIS — C251 Malignant neoplasm of body of pancreas: Secondary | ICD-10-CM | POA: Diagnosis not present

## 2020-07-15 DIAGNOSIS — N3289 Other specified disorders of bladder: Secondary | ICD-10-CM | POA: Diagnosis not present

## 2020-07-15 DIAGNOSIS — A4101 Sepsis due to Methicillin susceptible Staphylococcus aureus: Secondary | ICD-10-CM | POA: Diagnosis not present

## 2020-07-15 DIAGNOSIS — I2510 Atherosclerotic heart disease of native coronary artery without angina pectoris: Secondary | ICD-10-CM | POA: Diagnosis not present

## 2020-07-15 DIAGNOSIS — I10 Essential (primary) hypertension: Secondary | ICD-10-CM | POA: Diagnosis not present

## 2020-07-15 DIAGNOSIS — Z882 Allergy status to sulfonamides status: Secondary | ICD-10-CM | POA: Diagnosis not present

## 2020-07-15 DIAGNOSIS — Z66 Do not resuscitate: Secondary | ICD-10-CM

## 2020-07-15 DIAGNOSIS — Z515 Encounter for palliative care: Secondary | ICD-10-CM | POA: Diagnosis present

## 2020-07-15 DIAGNOSIS — Z87891 Personal history of nicotine dependence: Secondary | ICD-10-CM

## 2020-07-15 DIAGNOSIS — A419 Sepsis, unspecified organism: Secondary | ICD-10-CM | POA: Diagnosis present

## 2020-07-15 DIAGNOSIS — J479 Bronchiectasis, uncomplicated: Secondary | ICD-10-CM | POA: Diagnosis not present

## 2020-07-15 DIAGNOSIS — R7881 Bacteremia: Secondary | ICD-10-CM | POA: Diagnosis present

## 2020-07-15 DIAGNOSIS — K838 Other specified diseases of biliary tract: Secondary | ICD-10-CM | POA: Diagnosis not present

## 2020-07-15 DIAGNOSIS — Z8673 Personal history of transient ischemic attack (TIA), and cerebral infarction without residual deficits: Secondary | ICD-10-CM

## 2020-07-15 DIAGNOSIS — Z881 Allergy status to other antibiotic agents status: Secondary | ICD-10-CM

## 2020-07-15 DIAGNOSIS — E7800 Pure hypercholesterolemia, unspecified: Secondary | ICD-10-CM | POA: Diagnosis present

## 2020-07-15 DIAGNOSIS — R0689 Other abnormalities of breathing: Secondary | ICD-10-CM | POA: Diagnosis not present

## 2020-07-15 DIAGNOSIS — Z888 Allergy status to other drugs, medicaments and biological substances status: Secondary | ICD-10-CM

## 2020-07-15 DIAGNOSIS — I959 Hypotension, unspecified: Secondary | ICD-10-CM | POA: Diagnosis not present

## 2020-07-15 DIAGNOSIS — Z8249 Family history of ischemic heart disease and other diseases of the circulatory system: Secondary | ICD-10-CM

## 2020-07-15 DIAGNOSIS — Z79899 Other long term (current) drug therapy: Secondary | ICD-10-CM

## 2020-07-15 DIAGNOSIS — K5730 Diverticulosis of large intestine without perforation or abscess without bleeding: Secondary | ICD-10-CM | POA: Diagnosis not present

## 2020-07-15 DIAGNOSIS — R6510 Systemic inflammatory response syndrome (SIRS) of non-infectious origin without acute organ dysfunction: Secondary | ICD-10-CM | POA: Diagnosis not present

## 2020-07-15 DIAGNOSIS — I110 Hypertensive heart disease with heart failure: Secondary | ICD-10-CM | POA: Diagnosis present

## 2020-07-15 DIAGNOSIS — J9601 Acute respiratory failure with hypoxia: Secondary | ICD-10-CM | POA: Diagnosis present

## 2020-07-15 DIAGNOSIS — G9341 Metabolic encephalopathy: Secondary | ICD-10-CM | POA: Diagnosis not present

## 2020-07-15 DIAGNOSIS — Z8261 Family history of arthritis: Secondary | ICD-10-CM

## 2020-07-15 DIAGNOSIS — R531 Weakness: Secondary | ICD-10-CM | POA: Diagnosis not present

## 2020-07-15 DIAGNOSIS — R509 Fever, unspecified: Secondary | ICD-10-CM | POA: Diagnosis not present

## 2020-07-15 LAB — CBC WITH DIFFERENTIAL/PLATELET
Abs Immature Granulocytes: 0.01 10*3/uL (ref 0.00–0.07)
Basophils Absolute: 0 10*3/uL (ref 0.0–0.1)
Basophils Relative: 0 %
Eosinophils Absolute: 0 10*3/uL (ref 0.0–0.5)
Eosinophils Relative: 1 %
HCT: 35.3 % — ABNORMAL LOW (ref 36.0–46.0)
Hemoglobin: 11.5 g/dL — ABNORMAL LOW (ref 12.0–15.0)
Immature Granulocytes: 0 %
Lymphocytes Relative: 7 %
Lymphs Abs: 0.4 10*3/uL — ABNORMAL LOW (ref 0.7–4.0)
MCH: 32.2 pg (ref 26.0–34.0)
MCHC: 32.6 g/dL (ref 30.0–36.0)
MCV: 98.9 fL (ref 80.0–100.0)
Monocytes Absolute: 0.4 10*3/uL (ref 0.1–1.0)
Monocytes Relative: 7 %
Neutro Abs: 4.8 10*3/uL (ref 1.7–7.7)
Neutrophils Relative %: 85 %
Platelets: 224 10*3/uL (ref 150–400)
RBC: 3.57 MIL/uL — ABNORMAL LOW (ref 3.87–5.11)
RDW: 13.3 % (ref 11.5–15.5)
WBC: 5.7 10*3/uL (ref 4.0–10.5)
nRBC: 0 % (ref 0.0–0.2)

## 2020-07-15 LAB — URINALYSIS, ROUTINE W REFLEX MICROSCOPIC
Bilirubin Urine: NEGATIVE
Glucose, UA: NEGATIVE mg/dL
Hgb urine dipstick: NEGATIVE
Ketones, ur: 5 mg/dL — AB
Nitrite: POSITIVE — AB
Protein, ur: 30 mg/dL — AB
Specific Gravity, Urine: 1.044 — ABNORMAL HIGH (ref 1.005–1.030)
WBC, UA: >50 WBC/hpf — ABNORMAL HIGH (ref 0–5)
pH: 7 (ref 5.0–8.0)

## 2020-07-15 LAB — PROTIME-INR
INR: 1 (ref 0.8–1.2)
Prothrombin Time: 13.1 seconds (ref 11.4–15.2)

## 2020-07-15 LAB — LACTIC ACID, PLASMA: Lactic Acid, Venous: 1.5 mmol/L (ref 0.5–1.9)

## 2020-07-15 LAB — COMPREHENSIVE METABOLIC PANEL
ALT: 21 U/L (ref 0–44)
AST: 34 U/L (ref 15–41)
Albumin: 3.2 g/dL — ABNORMAL LOW (ref 3.5–5.0)
Alkaline Phosphatase: 74 U/L (ref 38–126)
Anion gap: 11 (ref 5–15)
BUN: 12 mg/dL (ref 8–23)
CO2: 33 mmol/L — ABNORMAL HIGH (ref 22–32)
Calcium: 8.7 mg/dL — ABNORMAL LOW (ref 8.9–10.3)
Chloride: 97 mmol/L — ABNORMAL LOW (ref 98–111)
Creatinine, Ser: 0.68 mg/dL (ref 0.44–1.00)
GFR calc Af Amer: >60 mL/min (ref >60)
GFR calc non Af Amer: >60 mL/min (ref >60)
Glucose, Bld: 118 mg/dL — ABNORMAL HIGH (ref 70–99)
Potassium: 3.5 mmol/L (ref 3.5–5.1)
Sodium: 141 mmol/L (ref 135–145)
Total Bilirubin: 1.3 mg/dL — ABNORMAL HIGH (ref 0.3–1.2)
Total Protein: 6.8 g/dL (ref 6.5–8.1)

## 2020-07-15 LAB — APTT: aPTT: 26 seconds (ref 24–36)

## 2020-07-15 LAB — SARS CORONAVIRUS 2 BY RT PCR (HOSPITAL ORDER, PERFORMED IN ~~LOC~~ HOSPITAL LAB): SARS Coronavirus 2: NEGATIVE

## 2020-07-15 LAB — LIPASE, BLOOD: Lipase: 158 U/L — ABNORMAL HIGH (ref 11–51)

## 2020-07-15 MED ORDER — TRAMADOL HCL 50 MG PO TABS: 50.0000 mg | ORAL_TABLET | Freq: Three times a day (TID) | ORAL | Status: DC | PRN

## 2020-07-15 MED ORDER — MEMANTINE HCL 10 MG PO TABS
10.0000 mg | ORAL_TABLET | Freq: Two times a day (BID) | ORAL | Status: DC
  Administered 2020-07-15 – 2020-07-18 (×6): 10 mg via ORAL
  Filled 2020-07-15 (×6): qty 1

## 2020-07-15 MED ORDER — QUETIAPINE FUMARATE 25 MG PO TABS
25.0000 mg | ORAL_TABLET | Freq: Every day | ORAL | Status: DC
  Administered 2020-07-15 – 2020-07-17 (×3): 25 mg via ORAL
  Filled 2020-07-15 (×3): qty 1

## 2020-07-15 MED ORDER — SODIUM CHLORIDE 0.9 % IV SOLN
2.0000 g | Freq: Once | INTRAVENOUS | Status: AC
  Administered 2020-07-15: 2 g via INTRAVENOUS
  Filled 2020-07-15: qty 2

## 2020-07-15 MED ORDER — BUMETANIDE 0.5 MG PO TABS
0.5000 mg | ORAL_TABLET | Freq: Every day | ORAL | Status: DC
  Administered 2020-07-15 – 2020-07-17 (×3): 0.5 mg via ORAL
  Filled 2020-07-15 (×4): qty 1

## 2020-07-15 MED ORDER — SODIUM CHLORIDE 0.9 % IV BOLUS (SEPSIS)
500.0000 mL | Freq: Once | INTRAVENOUS | Status: AC
  Administered 2020-07-15: 500 mL via INTRAVENOUS

## 2020-07-15 MED ORDER — SODIUM CHLORIDE 0.9 % IV SOLN
1.0000 g | INTRAVENOUS | Status: DC
  Administered 2020-07-15: 1 g via INTRAVENOUS
  Filled 2020-07-15 (×2): qty 10

## 2020-07-15 MED ORDER — METRONIDAZOLE IN NACL 5-0.79 MG/ML-% IV SOLN
500.0000 mg | Freq: Once | INTRAVENOUS | Status: AC
  Administered 2020-07-15: 500 mg via INTRAVENOUS
  Filled 2020-07-15: qty 100

## 2020-07-15 MED ORDER — SODIUM CHLORIDE 0.9 % IV BOLUS (SEPSIS)
1000.0000 mL | Freq: Once | INTRAVENOUS | Status: AC
  Administered 2020-07-15: 1000 mL via INTRAVENOUS

## 2020-07-15 MED ORDER — LORAZEPAM 0.5 MG PO TABS: 0.5000 mg | ORAL_TABLET | Freq: Two times a day (BID) | ORAL | Status: DC | PRN

## 2020-07-15 MED ORDER — DONEPEZIL HCL 10 MG PO TABS
10.0000 mg | ORAL_TABLET | Freq: Every day | ORAL | Status: DC
  Administered 2020-07-15 – 2020-07-17 (×3): 10 mg via ORAL
  Filled 2020-07-15 (×3): qty 1

## 2020-07-15 MED ORDER — SODIUM CHLORIDE 0.9 % IV BOLUS (SEPSIS)
250.0000 mL | Freq: Once | INTRAVENOUS | Status: AC
  Administered 2020-07-15: 250 mL via INTRAVENOUS

## 2020-07-15 MED ORDER — ACETAMINOPHEN 650 MG RE SUPP
650.0000 mg | Freq: Once | RECTAL | Status: AC
  Administered 2020-07-15: 650 mg via RECTAL
  Filled 2020-07-15: qty 1

## 2020-07-15 MED ORDER — ENOXAPARIN SODIUM 40 MG/0.4ML ~~LOC~~ SOLN
40.0000 mg | SUBCUTANEOUS | Status: DC
  Administered 2020-07-15 – 2020-07-17 (×3): 40 mg via SUBCUTANEOUS
  Filled 2020-07-15 (×3): qty 0.4

## 2020-07-15 MED ORDER — IOHEXOL 300 MG/ML  SOLN
100.0000 mL | Freq: Once | INTRAMUSCULAR | Status: AC | PRN
  Administered 2020-07-15: 100 mL via INTRAVENOUS

## 2020-07-15 MED ORDER — SODIUM CHLORIDE (PF) 0.9 % IJ SOLN
INTRAMUSCULAR | Status: AC
  Filled 2020-07-15: qty 50

## 2020-07-15 MED ORDER — PANTOPRAZOLE SODIUM 40 MG PO TBEC
40.0000 mg | DELAYED_RELEASE_TABLET | Freq: Every day | ORAL | Status: DC
  Administered 2020-07-15 – 2020-07-18 (×4): 40 mg via ORAL
  Filled 2020-07-15 (×4): qty 1

## 2020-07-15 NOTE — Progress Notes (Signed)
A consult was received from an ED physician for cefepime per pharmacy dosing.  The patient's profile has been reviewed for ht/wt/allergies/indication/available labs.    - PCN listed as an allergy. Pt received cefepime in the past.  A one time order has been placed for cefepime 2gm IV x1.  Further antibiotics/pharmacy consults should be ordered by admitting physician if indicated.                       Thank you, Lynelle Doctor 07/15/2020  9:09 AM

## 2020-07-15 NOTE — Progress Notes (Addendum)
[employer] [facility]) Hospitalized Hospice Patient  [name] is a current hospice patient with a terminal diagnosis of pancreatic cancer. On 7/15, she was noted to be weaker and slightly more confused. Facility contacted hospice and her daughter, decision was made to reassess her this am.  When daughter arrived at the facility, pt was hot to the touch and lethargic. Daughter requested she be transferred to the ED for further work up. She is admitted with sepsis. This is a related hospital admission per Dr. Konrad Dolores with [facility].  Pt still lethargic, shivering. Daughter at bedside. Updated her and advised that she will be admitted, currently in the ED waiting for bed placement.  V/S:  103.7 rectal, 147/64, HR 93, RR 18, SPO@ 87% on RA--O2 applied at 5 lpm > 97% I&O:  1750 NS bolus Labs: unremarkable; UA- cloudy with large leukocytes, + nitrites, WBC > 50  IVs/PRNs:  NS bolus total 1750, flagyl 500 mg IV x 1, rocephin 2 g IV x 1, tylenol 650 mg PR x 1 Diagnostics:  CT abd/pelvis with new metastatic lesions, acute cystitis  Problem list: -urosepsis - tmax 103.7, abx in ED, now afebrile -pancreatic cancer - no active treatment, just manage symptoms   GOC: remains DNR, however daughter would like full medical treatment for urosepsis D/C planning: return to facility with hospice support.  IDT: team updated Family: updated, dtr is a PA and well versed in her care    Transfer summary and med list to shadow chart.  Once ready to d/c, please use [facility] as they contract this service with [facility].   Venia Carbon RN, BSN, [facility] Liaison

## 2020-07-15 NOTE — ED Triage Notes (Signed)
Transported by [facility] from [facility] unit; staff stated that they notice altered mental status and UTI symptoms starting last night. Recently diagnosed with pancreatic cancer; Hx of urosepsis. + febrile, tachypnea. AAO x 1.

## 2020-07-15 NOTE — Progress Notes (Signed)
Patient's daughter [name] updated on patients arrival to unit. Additional admission questions asked for verification. Patient is in room resting. call light in reach.

## 2020-07-15 NOTE — H&P (Signed)
History and Physical    [name] FBP:[id number] DOB: 1926/09/25 DOA: 07/15/2020  PCP: Robyne Peers, MD  Patient coming from: ALF with hospice  Chief Complaint: Fevers, AMS  HPI: [name] is a 86 y.o. female with medical history significant of CAD, TIA, recently discharged in 6/21 for acute cystitis and obstructive jaundice found to have new diagnosis of pancreatic head cancer with mets to liver s/p CBD stent placement, discharged to ALF with hospice. Over the past 2-3 days, patient noted to have increased confusion and high fevers. Patient was subsequently referred to ED for further work up. Denies chest pain. Does feel chills.  ED Course: In the ED, pt found to have UA with large leuks, pos nitrites, and >50 WBC's. Pt was given broad spectrum abx in the ED. Pt presented with daughter at bedside who had wished to have the treatable treated if possible, including infections. Hospitalist consulted for consideration for admission  Review of Systems:  Review of Systems  Constitutional: Positive for chills, fever and malaise/fatigue.  HENT: Negative for congestion, ear discharge, ear pain and tinnitus.   Eyes: Negative for photophobia and pain.  Respiratory: Negative for hemoptysis and sputum production.   Cardiovascular: Negative for palpitations.  Gastrointestinal: Negative for abdominal pain, nausea and vomiting.  Genitourinary: Negative for frequency and urgency.  Musculoskeletal: Negative for back pain and joint pain.  Neurological: Negative for tremors, sensory change, seizures and loss of consciousness.  Psychiatric/Behavioral: Negative for hallucinations and memory loss. The patient is not nervous/anxious.     Past Medical History:  Diagnosis Date  . Aortic aneurysm, thoracic (HCC)    4.4 cm  . Aortic dilatation ([place])    Seen on cath 2005  . Arthritis   . CAD (coronary artery disease)    a. Nonobstructive by last cath in 05/2004 with 30% LAD. b. EF  65-70% 12/2010. c. Hx ASA intolerance.   . Colitis    12/2010 associated with transient lower GI bleeding/anemia tx with abx  . GERD (gastroesophageal reflux disease)    a. Minimal hiatal hernia on EGD 2006, no obvious esophageal source for CP at that time.  . Hypercholesteremia    Hx of myalgias felt to be statin related but taking Crestor as OP  . Osteoporosis   . Stroke [facility])    TIA    Past Surgical History:  Procedure Laterality Date  . ABDOMINAL HYSTERECTOMY    . BACK SURGERY     Disc disease  . BILIARY STENT PLACEMENT N/A 06/06/2020   Procedure: BILIARY STENT PLACEMENT;  Surgeon: Clarene Essex, MD;  Location: [facility];  Service: Gastroenterology;  Laterality: N/A;  . BIOPSY  06/06/2020   Procedure: BIOPSY;  Surgeon: Clarene Essex, MD;  Location: [facility];  Service: Gastroenterology;;  . ENDOSCOPIC RETROGRADE CHOLANGIOPANCREATOGRAPHY (ERCP) WITH PROPOFOL N/A 06/06/2020   Procedure: ENDOSCOPIC RETROGRADE CHOLANGIOPANCREATOGRAPHY (ERCP) WITH PROPOFOL;  Surgeon: Clarene Essex, MD;  Location: [facility];  Service: Gastroenterology;  Laterality: N/A;  . hysterectomy -- unknown type    . SHOULDER SURGERY    . Shunt for syringomyelia 1987    . SPHINCTEROTOMY  06/06/2020   Procedure: SPHINCTEROTOMY;  Surgeon: Clarene Essex, MD;  Location: [facility];  Service: Gastroenterology;;  . TEMPORAL ARTERY BIOPSY / LIGATION     Negative 2005     reports that she quit smoking about 61 years ago. She has never used smokeless tobacco. She reports that she does not drink alcohol and does not use drugs.  Allergies  Allergen Reactions  . Sulfa Antibiotics Swelling and Other (See Comments)    Throat swelling  . Aspirin Other (See Comments)    Feels like choking  . Crestor [Rosuvastatin] Other (See Comments)    Lower Extremity Myalgia   . Bactrim Other (See Comments)    Unknown  . Demeclocycline Rash  . Niacin And Related Other (See Comments)    Turns red in the face  . Penicillins Other (See  Comments)    Unknown Reaction Per The Patient Unknown  . Relafen [Nabumetone] Other (See Comments)    Unknown Reaction Per The Patient Unknown  . Tetracyclines & Related Rash    Family History  Problem Relation Age of Onset  . Heart disease Mother   . Heart failure Mother   . Arthritis Mother   . Heart disease Brother   . Heart disease Other   . Heart failure Other     Prior to Admission medications   Medication Sig Start Date End Date Taking? Authorizing Provider  acetaminophen (TYLENOL) 500 MG tablet Take 1,000 mg by mouth 2 (two) times daily as needed for mild pain.    Yes [provider]  bumetanide (BUMEX) 0.5 MG tablet Take 0.5 mg by mouth daily.   Yes [provider]  donepezil (ARICEPT) 10 MG tablet Take 1 tablet (10 mg total) by mouth at bedtime. 07/11/20  Yes McCue, Janett Billow, NP  furosemide (LASIX) 20 MG tablet Take 20 mg by mouth daily as needed (2lb weight gain in 1 day or 5lbs in one week).   Yes [provider]  Loperamide HCl (ANTI-DIARRHEAL PO) Take 2 tablets by mouth 4 (four) times daily as needed (diarrhea).   Yes [provider]  LORazepam (ATIVAN) 0.5 MG tablet Take 1 tablet (0.5 mg total) by mouth 2 (two) times daily as needed for anxiety or sleep. 06/07/20  Yes Aline August, MD  memantine (NAMENDA) 10 MG tablet Take 1 tablet (10 mg total) by mouth 2 (two) times daily. 07/11/20  Yes McCue, Janett Billow, NP  nystatin-triamcinolone (MYCOLOG II) cream Apply 1 application topically 2 (two) times daily as needed (atrophic vaginitis). Apply topically to vaginal area 05/03/20  Yes [provider]  ondansetron (ZOFRAN) 8 MG tablet Take 8 mg by mouth See admin instructions. 8mg  oral twice daily   And 8mg  every 8 hours as needed for nausea/vomitting 07/13/20  Yes [provider]  pantoprazole (PROTONIX) 40 MG tablet Take 40 mg by mouth daily.  05/03/20  Yes [provider]  Polyethyl Glycol-Propyl Glycol (SYSTANE OP) Place  2 drops into both eyes 2 (two) times daily.    Yes [provider]  potassium chloride SA (KLOR-CON) 20 MEQ tablet Take 40 mEq by mouth daily as needed (2lb weight gain in 1 day or 5lbs in 1 week).   Yes [provider]  Probiotic Product (ALIGN) 4 MG CAPS Take 4 mg by mouth daily.    Yes [provider]  PROCTOZONE-HC 2.5 % rectal cream Place 1 application rectally 2 (two) times daily as needed for hemorrhoids or anal itching.  09/11/19  Yes [provider]  QUEtiapine (SEROQUEL) 25 MG tablet Take 25 mg by mouth at bedtime. 07/13/20  Yes [provider]  traMADol (ULTRAM) 50 MG tablet Take 1 tablet (50 mg total) by mouth every 8 (eight) hours as needed for moderate pain or severe pain. 06/07/20  Yes Aline August, MD    Physical Exam: Vitals:   07/15/20 1215 07/15/20 1230 07/15/20  1315 07/15/20 1345  BP: (!) 117/54 (!) 123/55 (!) 125/58 (!) 135/57  Pulse: 76 73 75 75  Resp: (!) 23 14 (!) 25 (!) 21  Temp:  98.8 F (37.1 C)    TempSrc:  Oral    SpO2: 95% 98% 98% 98%    Constitutional: NAD, calm, comfortable, shivering Vitals:   07/15/20 1215 07/15/20 1230 07/15/20 1315 07/15/20 1345  BP: (!) 117/54 (!) 123/55 (!) 125/58 (!) 135/57  Pulse: 76 73 75 75  Resp: (!) 23 14 (!) 25 (!) 21  Temp:  98.8 F (37.1 C)    TempSrc:  Oral    SpO2: 95% 98% 98% 98%   Eyes: PERRL, lids and conjunctivae normal ENMT: Mucous membranes are dry, dentition fair Neck: normal, supple, no masses Respiratory: clear to auscultation bilaterally, no wheezing, no crackles. Normal respiratory effort. No accessory muscle use.  Cardiovascular: Regular rate and rhythm, s1, s2 Abdomen: no tenderness, no masses palpated. Mildly distended.  Musculoskeletal: no clubbing / cyanosis. No joint deformity upper and lower extremities. Good ROM, no contractures. Normal muscle tone.  Skin: no rashes, lesions. No induration Neurologic: CN 2-12 grossly intact. Sensation intact. Strength  5/5 in all 4.  Psychiatric: Normal judgment and insight. Mood seems normal   Labs on Admission: I have personally reviewed following labs and imaging studies  CBC: Recent Labs  Lab 07/15/20 0905  WBC 5.7  NEUTROABS 4.8  HGB 11.5*  HCT 35.3*  MCV 98.9  PLT 474   Basic Metabolic Panel: Recent Labs  Lab 07/15/20 0905  NA 141  K 3.5  CL 97*  CO2 33*  GLUCOSE 118*  BUN 12  CREATININE 0.68  CALCIUM 8.7*   GFR: Estimated Creatinine Clearance: 35.6 mL/min (by C-G formula based on SCr of 0.68 mg/dL). Liver Function Tests: Recent Labs  Lab 07/15/20 0905  AST 34  ALT 21  ALKPHOS 74  BILITOT 1.3*  PROT 6.8  ALBUMIN 3.2*   Recent Labs  Lab 07/15/20 0905  LIPASE 158*   No results for input(s): AMMONIA in the last 168 hours. Coagulation Profile: Recent Labs  Lab 07/15/20 0905  INR 1.0   Cardiac Enzymes: No results for input(s): CKTOTAL, CKMB, CKMBINDEX, TROPONINI in the last 168 hours. BNP (last 3 results) No results for input(s): PROBNP in the last 8760 hours. HbA1C: No results for input(s): HGBA1C in the last 72 hours. CBG: No results for input(s): GLUCAP in the last 168 hours. Lipid Profile: No results for input(s): CHOL, HDL, LDLCALC, TRIG, CHOLHDL, LDLDIRECT in the last 72 hours. Thyroid Function Tests: No results for input(s): TSH, T4TOTAL, FREET4, T3FREE, THYROIDAB in the last 72 hours. Anemia Panel: No results for input(s): VITAMINB12, FOLATE, FERRITIN, TIBC, IRON, RETICCTPCT in the last 72 hours. Urine analysis:    Component Value Date/Time   COLORURINE YELLOW 07/15/2020 0905   APPEARANCEUR CLOUDY (A) 07/15/2020 0905   LABSPEC 1.044 (H) 07/15/2020 0905   PHURINE 7.0 07/15/2020 0905   GLUCOSEU NEGATIVE 07/15/2020 0905   HGBUR NEGATIVE 07/15/2020 0905   BILIRUBINUR NEGATIVE 07/15/2020 0905   BILIRUBINUR neg 03/07/2015 1506   KETONESUR 5 (A) 07/15/2020 0905   PROTEINUR 30 (A) 07/15/2020 0905   UROBILINOGEN 0.2 03/07/2015 1506   UROBILINOGEN  0.2 07/30/2014 1236   NITRITE POSITIVE (A) 07/15/2020 0905   LEUKOCYTESUR LARGE (A) 07/15/2020 0905   Sepsis Labs: !!!!!!!!!!!!!!!!!!!!!!!!!!!!!!!!!!!!!!!!!!!! @LABRCNTIP (procalcitonin:4,lacticidven:4) ) Recent Results (from the past 240 hour(s))  SARS Coronavirus 2 by RT PCR (hospital order, performed in [facility] hospital lab)  Nasopharyngeal Nasopharyngeal Swab     Status: None   Collection Time: 07/15/20 12:36 PM   Specimen: Nasopharyngeal Swab  Result Value Ref Range Status   SARS Coronavirus 2 NEGATIVE NEGATIVE Final    Comment: (NOTE) SARS-CoV-2 target nucleic acids are NOT DETECTED.  The SARS-CoV-2 RNA is generally detectable in upper and lower respiratory specimens during the acute phase of infection. The lowest concentration of SARS-CoV-2 viral copies this assay can detect is 250 copies / mL. A negative result does not preclude SARS-CoV-2 infection and should not be used as the sole basis for treatment or other patient management decisions.  A negative result may occur with improper specimen collection / handling, submission of specimen other than nasopharyngeal swab, presence of viral mutation(s) within the areas targeted by this assay, and inadequate number of viral copies (<250 copies / mL). A negative result must be combined with clinical observations, patient history, and epidemiological information.  Fact Sheet for Patients:   StrictlyIdeas.no  Fact Sheet for Healthcare Providers: BankingDealers.co.za  This test is not yet approved or  cleared by the [place] FDA and has been authorized for detection and/or diagnosis of SARS-CoV-2 by FDA under an Emergency Use Authorization (EUA).  This EUA will remain in effect (meaning this test can be used) for the duration of the COVID-19 declaration under Section 564(b)(1) of the Act, 21 U.S.C. section 360bbb-3(b)(1), unless the authorization is terminated or revoked  sooner.  Performed at [facility],  [address] [place], [place] [address]      Radiological Exams on Admission: CT Abdomen Pelvis W Contrast  Result Date: 07/15/2020 CLINICAL DATA:  Altered mental status. UTI symptoms. Recent diagnosis of CBD obstruction with suspected pancreatic cancer. 06/06/2020 ERCP with CBD stent placement and biopsy demonstrating no evidence of malignancy. EXAM: CT ABDOMEN AND PELVIS WITH CONTRAST TECHNIQUE: Multidetector CT imaging of the abdomen and pelvis was performed using the standard protocol following bolus administration of intravenous contrast. CONTRAST:  157mL OMNIPAQUE IOHEXOL 300 MG/ML  SOLN COMPARISON:  06/01/2020 CT abdomen/pelvis and MRI abdomen. FINDINGS: Lower chest: Scattered mild-to-moderate bronchiectasis at both lung bases, most prominent in the right middle lobe with chronic prominent right middle lobe scarring. Dependent bibasilar atelectasis. Coronary atherosclerosis. Hepatobiliary: Several (at least 5) new and enlarging hypodense liver masses scattered throughout the liver. Representative 2.1 cm segment 5 right liver mass (series 2/image 26), increased from 1.1 cm. Representative 2.2 cm segment 2 left liver mass (series 2/image 23), increased from 1.2 cm. Representative segment 6 right liver 0.8 cm mass (series 2/image 32), new. Pneumobilia in the otherwise normal gallbladder with no radiopaque cholelithiasis. CBD stent in place. Interval decompression of the intrahepatic bile ducts. Pneumobilia in the central intrahepatic bile ducts indicating stent patency. Pancreas: Ill-defined 1.9 x 1.5 cm hypodense masslike focus in pancreatic head adjacent to the descending duodenum (series 2/image 35), previously 1.9 x 1.5 cm, stable. Stable mild diffuse pancreatic duct dilation (4 mm diameter). Spleen: Normal size. No mass. Adrenals/Urinary Tract: Normal adrenals. No hydronephrosis. Scattered subcentimeter hypodense right renal cortical  lesions are too small to characterize and are unchanged, considered benign. Simple 1.4 cm interpolar left renal cyst. Borderline mild diffuse bladder wall thickening, slightly increased from prior CT. Mild urothelial wall thickening and enhancement throughout the right ureter is similar to prior. Stomach/Bowel: Normal non-distended stomach. Normal caliber small bowel with no small bowel wall thickening. Normal appendix. Mild scattered left colonic diverticulosis with no large bowel wall thickening or acute pericolonic fat stranding. Vascular/Lymphatic:  Atherosclerotic nonaneurysmal abdominal aorta. Patent portal, splenic, hepatic and renal veins. No pathologically enlarged lymph nodes in the abdomen or pelvis. Reproductive: Status post hysterectomy, with no abnormal findings at the vaginal cuff. No adnexal mass. Other: No pneumoperitoneum. No focal fluid collections. Trace free fluid in the pelvic cul-de-sac. Musculoskeletal: No aggressive appearing focal osseous lesions. Chronic moderate T8, L1 and L3 vertebral compression fractures. Moderate thoracolumbar spondylosis. IMPRESSION: 1. Multiple new and enlarging hypodense liver masses, compatible with progression of liver metastases. 2. Stable ill-defined hypodense 1.9 cm masslike focus in the pancreatic head adjacent to the descending duodenum, likely the primary pancreatic malignancy. 3. Well position CBD stent with interval decompression of the intrahepatic bile ducts. Pneumobilia in the central intrahepatic bile ducts indicating stent patency. 4. Borderline mild diffuse bladder wall thickening, slightly increased from prior CT. Nonspecific mild urothelial wall thickening and enhancement throughout the right ureter, similar to prior. Findings suggest acute cystitis with ascending right urinary tract infection. Suggest correlation with urinalysis. 5. Chronic mild-to-moderate bronchiectasis at the lung bases, most prominent in the right middle lobe. 6. Coronary  atherosclerosis. 7. Mild left colonic diverticulosis. 8. Trace free fluid in the pelvic cul-de-sac. 9. Chronic moderate T8, L1 and L3 vertebral compression fractures. 10. Aortic Atherosclerosis (ICD10-I70.0). Electronically Signed   By: Ilona Sorrel M.D.   On: 07/15/2020 12:23   DG Chest Port 1 View  Result Date: 07/15/2020 CLINICAL DATA:  86 year old female with history of fever and weakness. EXAM: PORTABLE CHEST 1 VIEW COMPARISON:  Chest x-ray 06/04/2020. FINDINGS: Lung volumes are low. Mild diffuse peribronchial cuffing. No consolidative airspace disease. No pleural effusions. No pneumothorax. No pulmonary nodule or mass noted. Pulmonary vasculature and the cardiomediastinal silhouette are within normal limits. Severe calcifications of the mitral annulus. Aortic atherosclerosis. IMPRESSION: 1. Mild diffuse peribronchial cuffing which could indicate an acute bronchitis. 2. Aortic atherosclerosis. 3. Severe calcifications of the mitral annulus. Electronically Signed   By: Vinnie Langton M.D.   On: 07/15/2020 09:33    EKG: Independently reviewed. Sinus tach  Assessment/Plan Principal Problem:   UTI (urinary tract infection) Active Problems:   Hypercholesteremia   CAD (coronary artery disease)   HTN (hypertension)   Acute lower UTI   Sepsis ([place])   DNR (do not resuscitate)   1. UTI with sepsis present on admission 1. Presents febrile to 103.59F with tachypnea and UA suggestive of UTI 2. Clinically impoved once abx were started in ED, at present afebrile 3. Follow pan cultures 4. Will cont on rocephin 5. Repeat cbc in AM 2. Pancreatic cancer, recent diagnosis 1. Noted on recent admit 2. Pt not candidate for treatment, currently at ALF with hospice following 3. DNR gold form at bedside 4. Cont treatment for UTI above, given acute decompensation with sepsis, have consulted palliative care 3. CAD 1. Stable at this time 2. Denies chest pain 3. EKG nonischemic, reviewed 4. HTN 1. BP  stable at present 2. Cont to monitor  DVT prophylaxis: Lovenox subq  Code Status: DNR, confirmed with daughter in room Family Communication: Daughter at bedside  Disposition Plan: Uncertain at this time  Consults called: Palliative Care Admission status: Observation as would likely require less than 2 midnight stay for antibiotics   Marylu Lund MD [facility] Pager On Amion  If 7PM-7AM, please contact night-coverage  07/15/2020, 2:26 PM

## 2020-07-15 NOTE — ED Provider Notes (Signed)
[place] DEPT Provider Note   CSN: [id number] Arrival date & time: 07/15/20  0825     History Chief Complaint  Patient presents with  . Recurrent UTI    [name] is a 86 y.o. female.  Level 5 caveat secondary to altered mental status.  She is a resident at [facility] and was brought by ambulance for staff noticing altered mental status and UTI symptoms starting last evening.  She has a history of urosepsis.  Last admission had obstructive jaundice and had a biliary stent placed.  It sounds like at that time she was discharged under hospice.  Was noted to be febrile and tachypneic.  Patient herself denies any complaints.  Poor historian.  The history is provided by the patient, the EMS personnel and the nursing home. The history is limited by the condition of the patient.  Altered Mental Status Presenting symptoms: confusion   Severity:  Moderate Most recent episode:  Yesterday Episode history:  Single Timing:  Constant Progression:  Unchanged Chronicity:  Recurrent Context: dementia, nursing home resident and recent infection   Associated symptoms: fever   Associated symptoms: no abdominal pain and no vomiting        Past Medical History:  Diagnosis Date  . Aortic aneurysm, thoracic (HCC)    4.4 cm  . Aortic dilatation ([place])    Seen on cath 2005  . Arthritis   . CAD (coronary artery disease)    a. Nonobstructive by last cath in 05/2004 with 30% LAD. b. EF 65-70% 12/2010. c. Hx ASA intolerance.   . Colitis    12/2010 associated with transient lower GI bleeding/anemia tx with abx  . GERD (gastroesophageal reflux disease)    a. Minimal hiatal hernia on EGD 2006, no obvious esophageal source for CP at that time.  . Hypercholesteremia    Hx of myalgias felt to be statin related but taking Crestor as OP  . Osteoporosis   . Stroke [facility])    TIA    Patient Active Problem List   Diagnosis Date Noted  . Sepsis secondary to UTI  ([place]) 06/01/2020  . Elevated bilirubin 06/01/2020  . CAP (community acquired pneumonia) 01/11/2020  . Sepsis ([place]) 01/10/2020  . Alzheimer's disease ([place]) 07/02/2019  . Acute lower UTI 02/10/2019  . Acute metabolic encephalopathy 71/24/5809  . Encephalopathy acute 02/10/2019  . Chest pain, rule out acute myocardial infarction 09/03/2017  . Heart palpitations 07/31/2016  . Neck pain, musculoskeletal 04/26/2015  . Variants of migraine, not elsewhere classified, without mention of intractable migraine without mention of status migrainosus 08/17/2014  . Tension headache 08/17/2014  . TIA (transient ischemic attack) 07/30/2014  . HTN (hypertension) 07/30/2014  . Aortic aneurysm, thoracic ([place]) 06/25/2014  . Chest pain 07/29/2012  . CAD (coronary artery disease) 06/03/2012  . Hypercholesteremia   . GERD (gastroesophageal reflux disease)     Past Surgical History:  Procedure Laterality Date  . ABDOMINAL HYSTERECTOMY    . BACK SURGERY     Disc disease  . BILIARY STENT PLACEMENT N/A 06/06/2020   Procedure: BILIARY STENT PLACEMENT;  Surgeon: Clarene Essex, MD;  Location: [facility];  Service: Gastroenterology;  Laterality: N/A;  . BIOPSY  06/06/2020   Procedure: BIOPSY;  Surgeon: Clarene Essex, MD;  Location: [facility];  Service: Gastroenterology;;  . ENDOSCOPIC RETROGRADE CHOLANGIOPANCREATOGRAPHY (ERCP) WITH PROPOFOL N/A 06/06/2020   Procedure: ENDOSCOPIC RETROGRADE CHOLANGIOPANCREATOGRAPHY (ERCP) WITH PROPOFOL;  Surgeon: Clarene Essex, MD;  Location: [facility];  Service: Gastroenterology;  Laterality:  N/A;  . hysterectomy -- unknown type    . SHOULDER SURGERY    . Shunt for syringomyelia 1987    . SPHINCTEROTOMY  06/06/2020   Procedure: SPHINCTEROTOMY;  Surgeon: Clarene Essex, MD;  Location: [facility];  Service: Gastroenterology;;  . TEMPORAL ARTERY BIOPSY / LIGATION     Negative 2005     OB History   No obstetric history on file.     Family History  Problem Relation Age of Onset    . Heart disease Mother   . Heart failure Mother   . Arthritis Mother   . Heart disease Brother   . Heart disease Other   . Heart failure Other     Social History   Tobacco Use  . Smoking status: Former Smoker    Quit date: 04/26/1959    Years since quitting: 61.2  . Smokeless tobacco: Never Used  Vaping Use  . Vaping Use: Never used  Substance Use Topics  . Alcohol use: No    Alcohol/week: 1.0 standard drink    Types: 1 Glasses of wine per week  . Drug use: No    Home Medications Prior to Admission medications   Medication Sig Start Date End Date Taking? Authorizing Provider  acetaminophen (TYLENOL) 500 MG tablet Take 1,000 mg by mouth in the morning, at noon, and at bedtime.     [provider]  donepezil (ARICEPT) 10 MG tablet Take 1 tablet (10 mg total) by mouth at bedtime. 07/11/20   Frann Rider, NP  Loperamide HCl (ANTI-DIARRHEAL PO) Take 2 tablets by mouth 4 (four) times daily as needed (diarrhea).    [provider]  LORazepam (ATIVAN) 0.5 MG tablet Take 1 tablet (0.5 mg total) by mouth 2 (two) times daily as needed for anxiety or sleep. 06/07/20   Aline August, MD  memantine (NAMENDA) 10 MG tablet Take 1 tablet (10 mg total) by mouth 2 (two) times daily. 07/11/20   Frann Rider, NP  nystatin-triamcinolone (MYCOLOG II) cream Apply 1 application topically 2 (two) times daily as needed (atrophic vaginitis). Apply topically to vaginal area 05/03/20   [provider]  ondansetron (ZOFRAN-ODT) 8 MG disintegrating tablet Take 1 tablet by mouth every 8 (eight) hours as needed for nausea. Up to 5 days    [provider]  pantoprazole (PROTONIX) 40 MG tablet Take 1 tablet by mouth daily. 05/03/20   [provider]  Polyethyl Glycol-Propyl Glycol (SYSTANE OP) Place 2 drops into both eyes 2 (two) times daily.     [provider]  Probiotic Product (ALIGN) 4 MG CAPS Take 1 capsule by mouth daily.    [provider]   PROCTOZONE-HC 2.5 % rectal cream Place 1 application rectally 2 (two) times daily as needed for hemorrhoids or anal itching.  09/11/19   [provider]  traMADol (ULTRAM) 50 MG tablet Take 1 tablet (50 mg total) by mouth every 8 (eight) hours as needed for moderate pain or severe pain. Patient not taking: Reported on 07/11/2020 06/07/20   Aline August, MD    Allergies    Sulfa antibiotics, Aspirin, Crestor [rosuvastatin], Bactrim, Demeclocycline, Niacin and related, Penicillins, Relafen [nabumetone], and Tetracyclines & related  Review of Systems   Review of Systems  Unable to perform ROS: Dementia  Constitutional: Positive for fever.  Gastrointestinal: Negative for abdominal pain and vomiting.  Psychiatric/Behavioral: Positive for confusion.    Physical Exam Updated Vital Signs BP (!) 159/73   Pulse 97   Temp 98.8 F (  37.1 C) (Oral)   Resp (!) 33   SpO2 96%   Physical Exam Vitals and nursing note reviewed.  Constitutional:      General: She is not in acute distress.    Appearance: Normal appearance. She is well-developed.  HENT:     Head: Normocephalic and atraumatic.  Eyes:     Conjunctiva/sclera: Conjunctivae normal.  Cardiovascular:     Rate and Rhythm: Normal rate and regular rhythm.     Pulses: Normal pulses.     Heart sounds: No murmur heard.   Pulmonary:     Effort: Pulmonary effort is normal. Tachypnea present. No respiratory distress.     Breath sounds: Normal breath sounds.  Abdominal:     Palpations: Abdomen is soft.     Tenderness: There is no abdominal tenderness. There is no guarding or rebound.  Musculoskeletal:        General: No deformity or signs of injury. Normal range of motion.     Cervical back: Neck supple.  Skin:    General: Skin is warm and dry.     Capillary Refill: Capillary refill takes less than 2 seconds.  Neurological:     General: No focal deficit present.     Mental Status: She is alert. She is disoriented.     ED  Results / Procedures / Treatments   Labs (all labs ordered are listed, but only abnormal results are displayed) Labs Reviewed  COMPREHENSIVE METABOLIC PANEL - Abnormal; Notable for the following components:      Result Value   Chloride 97 (*)    CO2 33 (*)    Glucose, Bld 118 (*)    Calcium 8.7 (*)    Albumin 3.2 (*)    Total Bilirubin 1.3 (*)    All other components within normal limits  CBC WITH DIFFERENTIAL/PLATELET - Abnormal; Notable for the following components:   RBC 3.57 (*)    Hemoglobin 11.5 (*)    HCT 35.3 (*)    Lymphs Abs 0.4 (*)    All other components within normal limits  URINALYSIS, ROUTINE W REFLEX MICROSCOPIC - Abnormal; Notable for the following components:   APPearance CLOUDY (*)    Specific Gravity, Urine 1.044 (*)    Ketones, ur 5 (*)    Protein, ur 30 (*)    Nitrite POSITIVE (*)    Leukocytes,Ua LARGE (*)    WBC, UA >50 (*)    Bacteria, UA RARE (*)    All other components within normal limits  LIPASE, BLOOD - Abnormal; Notable for the following components:   Lipase 158 (*)    All other components within normal limits  SARS CORONAVIRUS 2 BY RT PCR (HOSPITAL ORDER, [place] [facility])  CULTURE, BLOOD (ROUTINE X 2)  CULTURE, BLOOD (ROUTINE X 2)  URINE CULTURE  LACTIC ACID, PLASMA  APTT  PROTIME-INR  CBC  CREATININE, SERUM  COMPREHENSIVE METABOLIC PANEL  CBC    EKG EKG Interpretation  Date/Time:  Friday July 15 2020 09:44:00 EDT Ventricular Rate:  103 PR Interval:    QRS Duration: 81 QT Interval:  366 QTC Calculation: 480 R Axis:   -62 Text Interpretation: likely sinus with tremor artifact Inferior infarct, old Consider anterior infarct Confirmed by Aletta Edouard [phone]) on 07/15/2020 9:57:15 AM   Radiology CT Abdomen Pelvis W Contrast  Result Date: 07/15/2020 CLINICAL DATA:  Altered mental status. UTI symptoms. Recent diagnosis of CBD obstruction with suspected pancreatic cancer. 06/06/2020 ERCP with CBD stent  placement  and biopsy demonstrating no evidence of malignancy. EXAM: CT ABDOMEN AND PELVIS WITH CONTRAST TECHNIQUE: Multidetector CT imaging of the abdomen and pelvis was performed using the standard protocol following bolus administration of intravenous contrast. CONTRAST:  164mL OMNIPAQUE IOHEXOL 300 MG/ML  SOLN COMPARISON:  06/01/2020 CT abdomen/pelvis and MRI abdomen. FINDINGS: Lower chest: Scattered mild-to-moderate bronchiectasis at both lung bases, most prominent in the right middle lobe with chronic prominent right middle lobe scarring. Dependent bibasilar atelectasis. Coronary atherosclerosis. Hepatobiliary: Several (at least 5) new and enlarging hypodense liver masses scattered throughout the liver. Representative 2.1 cm segment 5 right liver mass (series 2/image 26), increased from 1.1 cm. Representative 2.2 cm segment 2 left liver mass (series 2/image 23), increased from 1.2 cm. Representative segment 6 right liver 0.8 cm mass (series 2/image 32), new. Pneumobilia in the otherwise normal gallbladder with no radiopaque cholelithiasis. CBD stent in place. Interval decompression of the intrahepatic bile ducts. Pneumobilia in the central intrahepatic bile ducts indicating stent patency. Pancreas: Ill-defined 1.9 x 1.5 cm hypodense masslike focus in pancreatic head adjacent to the descending duodenum (series 2/image 35), previously 1.9 x 1.5 cm, stable. Stable mild diffuse pancreatic duct dilation (4 mm diameter). Spleen: Normal size. No mass. Adrenals/Urinary Tract: Normal adrenals. No hydronephrosis. Scattered subcentimeter hypodense right renal cortical lesions are too small to characterize and are unchanged, considered benign. Simple 1.4 cm interpolar left renal cyst. Borderline mild diffuse bladder wall thickening, slightly increased from prior CT. Mild urothelial wall thickening and enhancement throughout the right ureter is similar to prior. Stomach/Bowel: Normal non-distended stomach. Normal caliber  small bowel with no small bowel wall thickening. Normal appendix. Mild scattered left colonic diverticulosis with no large bowel wall thickening or acute pericolonic fat stranding. Vascular/Lymphatic: Atherosclerotic nonaneurysmal abdominal aorta. Patent portal, splenic, hepatic and renal veins. No pathologically enlarged lymph nodes in the abdomen or pelvis. Reproductive: Status post hysterectomy, with no abnormal findings at the vaginal cuff. No adnexal mass. Other: No pneumoperitoneum. No focal fluid collections. Trace free fluid in the pelvic cul-de-sac. Musculoskeletal: No aggressive appearing focal osseous lesions. Chronic moderate T8, L1 and L3 vertebral compression fractures. Moderate thoracolumbar spondylosis. IMPRESSION: 1. Multiple new and enlarging hypodense liver masses, compatible with progression of liver metastases. 2. Stable ill-defined hypodense 1.9 cm masslike focus in the pancreatic head adjacent to the descending duodenum, likely the primary pancreatic malignancy. 3. Well position CBD stent with interval decompression of the intrahepatic bile ducts. Pneumobilia in the central intrahepatic bile ducts indicating stent patency. 4. Borderline mild diffuse bladder wall thickening, slightly increased from prior CT. Nonspecific mild urothelial wall thickening and enhancement throughout the right ureter, similar to prior. Findings suggest acute cystitis with ascending right urinary tract infection. Suggest correlation with urinalysis. 5. Chronic mild-to-moderate bronchiectasis at the lung bases, most prominent in the right middle lobe. 6. Coronary atherosclerosis. 7. Mild left colonic diverticulosis. 8. Trace free fluid in the pelvic cul-de-sac. 9. Chronic moderate T8, L1 and L3 vertebral compression fractures. 10. Aortic Atherosclerosis (ICD10-I70.0). Electronically Signed   By: Ilona Sorrel M.D.   On: 07/15/2020 12:23   DG Chest Port 1 View  Result Date: 07/15/2020 CLINICAL DATA:  86 year old  female with history of fever and weakness. EXAM: PORTABLE CHEST 1 VIEW COMPARISON:  Chest x-ray 06/04/2020. FINDINGS: Lung volumes are low. Mild diffuse peribronchial cuffing. No consolidative airspace disease. No pleural effusions. No pneumothorax. No pulmonary nodule or mass noted. Pulmonary vasculature and the cardiomediastinal silhouette are within normal limits. Severe calcifications of the mitral annulus. Aortic  atherosclerosis. IMPRESSION: 1. Mild diffuse peribronchial cuffing which could indicate an acute bronchitis. 2. Aortic atherosclerosis. 3. Severe calcifications of the mitral annulus. Electronically Signed   By: Vinnie Langton M.D.   On: 07/15/2020 09:33    Procedures .Critical Care Performed by: Hayden Rasmussen, MD Authorized by: Hayden Rasmussen, MD   Critical care provider statement:    Critical care time (minutes):  45   Critical care time was exclusive of:  Separately billable procedures and treating other patients   Critical care was necessary to treat or prevent imminent or life-threatening deterioration of the following conditions:  Sepsis and CNS failure or compromise   Critical care was time spent personally by me on the following activities:  Discussions with consultants, evaluation of patient's response to treatment, examination of patient, ordering and performing treatments and interventions, ordering and review of laboratory studies, ordering and review of radiographic studies, pulse oximetry, re-evaluation of patient's condition, obtaining history from patient or surrogate, review of old charts and development of treatment plan with patient or surrogate   I assumed direction of critical care for this patient from another provider in my specialty: no     (including critical care time)  Medications Ordered in ED Medications  sodium chloride (PF) 0.9 % injection (has no administration in time range)  enoxaparin (LOVENOX) injection 40 mg (has no administration in  time range)  cefTRIAXone (ROCEPHIN) 1 g in sodium chloride 0.9 % 100 mL IVPB (1 g Intravenous New Bag/Given (Non-Interop) 07/15/20 1713)  bumetanide (BUMEX) tablet 0.5 mg (0.5 mg Oral Given 07/15/20 1713)  donepezil (ARICEPT) tablet 10 mg (has no administration in time range)  LORazepam (ATIVAN) tablet 0.5 mg (has no administration in time range)  memantine (NAMENDA) tablet 10 mg (has no administration in time range)  pantoprazole (PROTONIX) EC tablet 40 mg (40 mg Oral Given 07/15/20 1714)  QUEtiapine (SEROQUEL) tablet 25 mg (has no administration in time range)  traMADol (ULTRAM) tablet 50 mg (has no administration in time range)  sodium chloride 0.9 % bolus 1,000 mL (0 mLs Intravenous Stopped 07/15/20 1118)    And  sodium chloride 0.9 % bolus 500 mL (0 mLs Intravenous Stopped 07/15/20 1550)    And  sodium chloride 0.9 % bolus 250 mL (0 mLs Intravenous Stopped 07/15/20 1550)  ceFEPIme (MAXIPIME) 2 g in sodium chloride 0.9 % 100 mL IVPB (0 g Intravenous Stopped 07/15/20 0948)  metroNIDAZOLE (FLAGYL) IVPB 500 mg (0 mg Intravenous Stopped 07/15/20 1118)  acetaminophen (TYLENOL) suppository 650 mg (650 mg Rectal Given 07/15/20 0938)  iohexol (OMNIPAQUE) 300 MG/ML solution 100 mL (100 mLs Intravenous Contrast Given 07/15/20 1130)    ED Course  I have reviewed the triage vital signs and the nursing notes.  Pertinent labs & imaging results that were available during my care of the patient were reviewed by me and considered in my medical decision making (see chart for details).  Clinical Course as of Jul 16 1739  Fri Jul 15, 2020  0940 Chest x-ray interpreted by me as no acute infiltrates no free air.   [MB]  1000 Patient's daughter is here.  She says the patient is under hospice.  She was fine until last evening.  Doing her regular ADLs and going to the dining hall.  Last evening she was generally weak and needed 2 person assist.   [MB]  1006 As far as goals of care she is a DNR/DNI.  She is under  hospice.  Daughter is comfortable with  her getting fluids antibiotics and admission if necessary.  She still has fairly good quality of life and if this is a correctable condition she would like to pursue that.  Would accept admission if necessary.   [MB]  0301 Discussed with Dr. Wyline Copas [facility] hospitalist who will evaluate the patient for admission.   [MB]    Clinical Course User Index [MB] Hayden Rasmussen, MD   MDM Rules/Calculators/A&P                         This patient complains of altered mental status and high fever; this involves an extensive number of treatment Options and is a complaint that carries with it a high risk of complications and Morbidity. The differential includes sepsis, Sirs, pyelonephritis, infected biliary stent, metabolic derangement, pneumonia  I ordered, reviewed and interpreted labs, which included CBC with normal white count, stable hemoglobin, chemistries with Mildly low chloride of 97 elevated blood glucose, LFTs normal other than mildly elevated T bili but improved from prior admission,, urinalysis with signs of infection greater than 50 whites nitrite positive sent for culture  I ordered medication IV fluids for presumptive sepsis and early antibiotics I ordered imaging studies which included chest x-ray and CT abdomen and pelvis and I independently    visualized and interpreted imaging which showed no gross pulmonary infiltrates, does have some bladder thickening, no obvious biliary tree dilatation Additional history obtained from patient's daughter Previous records obtained and reviewed in epic including last admission for obstructive jaundice I consulted [facility] hospitalist Dr. Wyline Copas and discussed lab and imaging findings  Critical Interventions: Identification of and management of sepsis with IV fluids and antibiotics.  After the interventions stated above, I reevaluated the patient and found mental status to still be off.  Will need admission for continued  antibiotics.  Ultimately will need further goals of care discussion with daughter and palliative/hospice   Final Clinical Impression(s) / ED Diagnoses Final diagnoses:  SIRS (systemic inflammatory response syndrome) ([place])  Urinary tract infection with hematuria, site unspecified  Primary pancreatic cancer with metastasis to other site [facility])    Rx / [place] Orders ED Discharge Orders    None       Hayden Rasmussen, MD 07/15/20 1745

## 2020-07-15 NOTE — ED Notes (Signed)
Admission MD at bedside speaking with patient.

## 2020-07-15 NOTE — ED Notes (Signed)
Unable to obtain EKG at this time due to patient shivering due to high fever. Will obtain EKG at later time when patients fever lowers and patient is not shivering.

## 2020-07-15 NOTE — ED Notes (Signed)
Patient resting. Provided with meal tray.

## 2020-07-16 DIAGNOSIS — E7800 Pure hypercholesterolemia, unspecified: Secondary | ICD-10-CM | POA: Diagnosis present

## 2020-07-16 DIAGNOSIS — A4151 Sepsis due to Escherichia coli [E. coli]: Secondary | ICD-10-CM | POA: Diagnosis present

## 2020-07-16 DIAGNOSIS — J9601 Acute respiratory failure with hypoxia: Secondary | ICD-10-CM | POA: Diagnosis present

## 2020-07-16 DIAGNOSIS — M4854XA Collapsed vertebra, not elsewhere classified, thoracic region, initial encounter for fracture: Secondary | ICD-10-CM | POA: Diagnosis present

## 2020-07-16 DIAGNOSIS — A4101 Sepsis due to Methicillin susceptible Staphylococcus aureus: Secondary | ICD-10-CM

## 2020-07-16 DIAGNOSIS — Z87891 Personal history of nicotine dependence: Secondary | ICD-10-CM | POA: Diagnosis not present

## 2020-07-16 DIAGNOSIS — I110 Hypertensive heart disease with heart failure: Secondary | ICD-10-CM | POA: Diagnosis present

## 2020-07-16 DIAGNOSIS — Z66 Do not resuscitate: Secondary | ICD-10-CM | POA: Diagnosis present

## 2020-07-16 DIAGNOSIS — I5032 Chronic diastolic (congestive) heart failure: Secondary | ICD-10-CM | POA: Diagnosis present

## 2020-07-16 DIAGNOSIS — Z20822 Contact with and (suspected) exposure to covid-19: Secondary | ICD-10-CM | POA: Diagnosis present

## 2020-07-16 DIAGNOSIS — R7881 Bacteremia: Secondary | ICD-10-CM | POA: Diagnosis not present

## 2020-07-16 DIAGNOSIS — Z515 Encounter for palliative care: Secondary | ICD-10-CM | POA: Diagnosis present

## 2020-07-16 DIAGNOSIS — J479 Bronchiectasis, uncomplicated: Secondary | ICD-10-CM | POA: Diagnosis present

## 2020-07-16 DIAGNOSIS — N390 Urinary tract infection, site not specified: Secondary | ICD-10-CM | POA: Diagnosis present

## 2020-07-16 DIAGNOSIS — R319 Hematuria, unspecified: Secondary | ICD-10-CM | POA: Diagnosis present

## 2020-07-16 DIAGNOSIS — Z881 Allergy status to other antibiotic agents status: Secondary | ICD-10-CM | POA: Diagnosis not present

## 2020-07-16 DIAGNOSIS — C250 Malignant neoplasm of head of pancreas: Secondary | ICD-10-CM | POA: Diagnosis present

## 2020-07-16 DIAGNOSIS — E876 Hypokalemia: Secondary | ICD-10-CM | POA: Diagnosis present

## 2020-07-16 DIAGNOSIS — C787 Secondary malignant neoplasm of liver and intrahepatic bile duct: Secondary | ICD-10-CM | POA: Diagnosis present

## 2020-07-16 DIAGNOSIS — Z8249 Family history of ischemic heart disease and other diseases of the circulatory system: Secondary | ICD-10-CM | POA: Diagnosis not present

## 2020-07-16 DIAGNOSIS — H5347 Heteronymous bilateral field defects: Secondary | ICD-10-CM | POA: Diagnosis present

## 2020-07-16 DIAGNOSIS — I639 Cerebral infarction, unspecified: Secondary | ICD-10-CM | POA: Diagnosis present

## 2020-07-16 DIAGNOSIS — H02401 Unspecified ptosis of right eyelid: Secondary | ICD-10-CM | POA: Diagnosis present

## 2020-07-16 DIAGNOSIS — G9341 Metabolic encephalopathy: Secondary | ICD-10-CM | POA: Diagnosis present

## 2020-07-16 DIAGNOSIS — Z882 Allergy status to sulfonamides status: Secondary | ICD-10-CM | POA: Diagnosis not present

## 2020-07-16 DIAGNOSIS — I2510 Atherosclerotic heart disease of native coronary artery without angina pectoris: Secondary | ICD-10-CM | POA: Diagnosis present

## 2020-07-16 DIAGNOSIS — I10 Essential (primary) hypertension: Secondary | ICD-10-CM | POA: Diagnosis not present

## 2020-07-16 LAB — BLOOD CULTURE ID PANEL (REFLEXED)

## 2020-07-16 LAB — CBC
HCT: 39.3 % (ref 36.0–46.0)
Hemoglobin: 12.5 g/dL (ref 12.0–15.0)
MCH: 31.4 pg (ref 26.0–34.0)
MCHC: 31.8 g/dL (ref 30.0–36.0)
MCV: 98.7 fL (ref 80.0–100.0)
Platelets: 228 K/uL (ref 150–400)
RBC: 3.98 MIL/uL (ref 3.87–5.11)
RDW: 13.2 % (ref 11.5–15.5)
WBC: 7.7 K/uL (ref 4.0–10.5)
nRBC: 0 % (ref 0.0–0.2)

## 2020-07-16 LAB — COMPREHENSIVE METABOLIC PANEL WITH GFR
ALT: 19 U/L (ref 0–44)
AST: 28 U/L (ref 15–41)
Albumin: 2.8 g/dL — ABNORMAL LOW (ref 3.5–5.0)
Alkaline Phosphatase: 69 U/L (ref 38–126)
Anion gap: 16 — ABNORMAL HIGH (ref 5–15)
BUN: 13 mg/dL (ref 8–23)
CO2: 28 mmol/L (ref 22–32)
Calcium: 8.5 mg/dL — ABNORMAL LOW (ref 8.9–10.3)
Chloride: 95 mmol/L — ABNORMAL LOW (ref 98–111)
Creatinine, Ser: 0.65 mg/dL (ref 0.44–1.00)
GFR calc Af Amer: >60 mL/min
GFR calc non Af Amer: >60 mL/min
Glucose, Bld: 111 mg/dL — ABNORMAL HIGH (ref 70–99)
Potassium: 2.5 mmol/L — CL (ref 3.5–5.1)
Sodium: 139 mmol/L (ref 135–145)
Total Bilirubin: 1.3 mg/dL — ABNORMAL HIGH (ref 0.3–1.2)
Total Protein: 6.2 g/dL — ABNORMAL LOW (ref 6.5–8.1)

## 2020-07-16 MED ORDER — VANCOMYCIN HCL 750 MG/150ML IV SOLN
750.0000 mg | INTRAVENOUS | Status: DC
  Administered 2020-07-16 – 2020-07-17 (×2): 750 mg via INTRAVENOUS
  Filled 2020-07-16 (×3): qty 150

## 2020-07-16 MED ORDER — ACETAMINOPHEN 650 MG RE SUPP
650.0000 mg | RECTAL | Status: DC | PRN
  Administered 2020-07-18: 650 mg via RECTAL
  Filled 2020-07-16 (×2): qty 1

## 2020-07-16 MED ORDER — POTASSIUM CHLORIDE 20 MEQ/15ML (10%) PO SOLN
40.0000 meq | ORAL | Status: AC
  Administered 2020-07-16 (×2): 40 meq via ORAL
  Filled 2020-07-16 (×2): qty 30

## 2020-07-16 MED ORDER — SODIUM CHLORIDE 0.9 % IV SOLN
2.0000 g | INTRAVENOUS | Status: DC
  Filled 2020-07-16: qty 20

## 2020-07-16 MED ORDER — SODIUM CHLORIDE 0.9 % IV SOLN
1.0000 g | INTRAVENOUS | Status: DC
  Administered 2020-07-16 – 2020-07-17 (×2): 1 g via INTRAVENOUS
  Filled 2020-07-16: qty 1
  Filled 2020-07-16 (×2): qty 10

## 2020-07-16 NOTE — Progress Notes (Signed)
°  PHARMACY - PHYSICIAN COMMUNICATION CRITICAL VALUE ALERT - BLOOD CULTURE IDENTIFICATION (BCID)  [name] is an 86 y.o. female who presented to [facility] on 07/15/2020 with a chief complaint of UTI  Assessment:  1/1 BCx with Staph species mec A detected, possible contaminant in patient without signs of bacteremia  Name of physician (or Provider) Contacted: [place]  Current antibiotics: Ceftriaxone  Changes to prescribed antibiotics recommended:  Patient is on recommended antibiotics - No changes needed  Results for orders placed or performed during the hospital encounter of 01/10/20  Blood Culture ID Panel (Reflexed) (Collected: 01/10/2020  5:28 PM)  Result Value Ref Range   Enterococcus species NOT DETECTED NOT DETECTED   Listeria monocytogenes NOT DETECTED NOT DETECTED   Staphylococcus species DETECTED (A) NOT DETECTED   Staphylococcus aureus (BCID) DETECTED (A) NOT DETECTED   Methicillin resistance NOT DETECTED NOT DETECTED   Streptococcus species NOT DETECTED NOT DETECTED   Streptococcus agalactiae NOT DETECTED NOT DETECTED   Streptococcus pneumoniae NOT DETECTED NOT DETECTED   Streptococcus pyogenes NOT DETECTED NOT DETECTED   Acinetobacter baumannii NOT DETECTED NOT DETECTED   Enterobacteriaceae species NOT DETECTED NOT DETECTED   Enterobacter cloacae complex NOT DETECTED NOT DETECTED   Escherichia coli NOT DETECTED NOT DETECTED   Klebsiella oxytoca NOT DETECTED NOT DETECTED   Klebsiella pneumoniae NOT DETECTED NOT DETECTED   Proteus species NOT DETECTED NOT DETECTED   Serratia marcescens NOT DETECTED NOT DETECTED   Haemophilus influenzae NOT DETECTED NOT DETECTED   Neisseria meningitidis NOT DETECTED NOT DETECTED   Pseudomonas aeruginosa NOT DETECTED NOT DETECTED   Candida albicans NOT DETECTED NOT DETECTED   Candida glabrata NOT DETECTED NOT DETECTED   Candida krusei NOT DETECTED NOT DETECTED   Candida parapsilosis NOT DETECTED NOT DETECTED   Candida  tropicalis NOT DETECTED NOT DETECTED    [place] Form 07/16/2020  7:27 AM

## 2020-07-16 NOTE — Progress Notes (Signed)
This Probation officer was notified by NT that patient had entered yellow mews with a T102.9. Charge nurse was notified, MD was notified via text. This Probation officer gave patient PRN tylenol suppoository and will monitor patient closely.

## 2020-07-16 NOTE — Progress Notes (Signed)
Notified on call provider of critical potassium level of 2.5

## 2020-07-16 NOTE — Progress Notes (Signed)
[facility]) Hospitalized Hospice Patient  [name] is a current hospice patient with a terminal diagnosis of pancreatic cancer. On 7/15, she was noted to be weaker and slightly more confused. Facility contacted hospice and her daughter, decision was made to reassess her this am.  When daughter arrived at the facility, pt was hot to the touch and lethargic. Daughter requested she be transferred to the ED for further work up. She is admitted with sepsis. This is a related hospital admission per Dr. Konrad Dolores with [facility].  Pt alert and oriented, she endorses that she did not sleep well. Dtr at bedside, states she is not eating or drinking much.  Spoke with dtr outside of room, per dtrs request, regarding next steps.  She understands the severity of her mothers illness.  She is concerned with her returning to her facility, as they are not likely to accept her back if she is not back to her baseline.  Discussed the probability of this not happening as she was mostly wheelchair bound. Will have to see what the outcome is over the next day or so based on how she responds to treatment.  We also discussed stopping all abx/IVs and shifting towards full comfort, [name] does not want to necessarily make that decision today. We also discussed further interventions to support her sepsis, [name] would not want any pressors or central lines.   V/S:  100.4 oral, 92/46, HR 81, RR 18, SPO2 95% [place] 5 lpm I&O:  /400 Labs:  K 2.5, chloride 95, total protein 6.2, total BR 1.3, 1/1 BCx with Staph species mec A detected Diagnostics: portable chest-Mild diffuse peribronchial cuffing which could indicate an acute bronchitis  IVs/PRNs: rocephin 1g IV QD, vancomycin 750 mg IV QD  Problem List: - urosepsis - febrile throughout day, on rocephin, vanc added after bcx obtained - hypokalemia - repleted - pancreatic cancer - symptom management, no active treatment  D/C planning- ongoing.  Pt is from  an AL and cannot return if she is not at her baseline.  She was currently exceeding the level of care at the AL.  Will need to see how she responds to treatment. GOC- clear.  DNR, dtr elected to treat infection, however does not want care escalated.   IDT- team updated Family - present at bedside, dtr is a PA and well versed in her care.  Venia Carbon RN, [facility] Liaison

## 2020-07-16 NOTE — Progress Notes (Signed)
The patient is back in the green mews. Fever is coming down slowly. This writer will continue to monitor vitals signs.

## 2020-07-16 NOTE — Progress Notes (Signed)
PMT consult received and chart reviewed. Discussed with Dr. Wyonia Hough via epic chat and [facility] hospice liaison, Venia Carbon. Patient is a current hospice patient at ALF with hospice diagnosis of pancreatic cancer. Remains DNR but daughter wishes to treat the treatable (UTI/bacteremia). When stable for discharge, plan will be to return to ALF with hospice support. No current symptom management needs. Goals are clear. [facility] hospice liaison will continue to follow inpatient.   Please notify PMT provider if further goals of care discussions necessary or symptom management needs. Thank you.   NO CHARGE  Ihor Dow, [place], FNP-C Palliative Medicine Team  Phone: [phone] Fax: [phone]

## 2020-07-16 NOTE — Progress Notes (Signed)
PROGRESS NOTE    [name]  WJX:[id number] DOB: Apr 09, 1926 DOA: 07/15/2020 PCP: Robyne Peers, MD   Brief Narrative:  Per admitting: HPI: [name] is a 86 y.o. female with medical history significant of CAD, TIA, recently discharged in 6/21 for acute cystitis and obstructive jaundice found to have new diagnosis of pancreatic head cancer with mets to liver s/p CBD stent placement, discharged to ALF with hospice. Over the past 2-3 days, patient noted to have increased confusion and high fevers. Patient was subsequently referred to ED for further work up. Denies chest pain. Does feel chills.  ED Course: In the ED, pt found to have UA with large leuks, pos nitrites, and >50 WBC's. Pt was given broad spectrum abx in the ED. Pt presented with daughter at bedside who had wished to have the treatable treated if possible, including infections. Hospitalist consulted for consideration for admission   Assessment & Plan:   Principal Problem:   UTI (urinary tract infection) Active Problems:   Hypercholesteremia   CAD (coronary artery disease)   HTN (hypertension)   Acute lower UTI   Sepsis ([place])   DNR (do not resuscitate)    1. UTI/bacteremia with sepsis present on admission 1. Presents febrile to 103.66F with tachypnea and UA suggestive of UTI 2. Clinically impoved once abx were started in ED, at present afebrile 3. Follow pan cultures 4. Will cont on rocephin 5. Repeat cbc in AM 6. Add vanc, follows sensitivities-discussed with pharm 2. Pancreatic cancer, recent diagnosis 1. Noted on recent admit 2. Pt not candidate for treatment, currently at ALF with hospice following 3. DNR gold form at bedside 4. Cont treatment for UTI/Bacteremia above, given acute decompensation with sepsis, admitting consulted palliative care 3. CAD 1. Stable at this time 2. Denies chest pain 3. EKG nonischemic, reviewed 4. HTN 1. BP stable at present 2. Cont to monitor  DVT  prophylaxis: Lovenox SQ  Code Status: DNR    Code Status Orders  (From admission, onward)         Start     Ordered   07/15/20 1420  Do not attempt resuscitation (DNR)  Continuous       Question Answer Comment  In the event of cardiac or respiratory ARREST Do not call a "code blue"   In the event of cardiac or respiratory ARREST Do not perform Intubation, CPR, defibrillation or ACLS   In the event of cardiac or respiratory ARREST Use medication by any route, position, wound care, and other measures to relive pain and suffering. May use oxygen, suction and manual treatment of airway obstruction as needed for comfort.   Comments Confirmed with daughter (POA) at bedside      07/15/20 1423        Code Status History    Date Active Date Inactive Code Status Order ID Comments User Context   06/01/2020 1111 06/07/2020 1951 DNR [id number]  Little Ishikawa, MD ED   01/11/2020 0100 01/14/2020 2235 DNR [id number]  Rise Patience, MD ED   02/10/2019 1024 02/12/2019 1503 DNR [id number]  Barb Merino, MD ED   09/03/2017 2312 09/04/2017 1836 Full Code [id number]  Etta Quill, DO ED   07/30/2014 1339 07/31/2014 1641 Full Code [id number]  Debbe Odea, MD ED   07/30/2012 0843 07/31/2012 1956 Full Code [id number]  Street, Sharon Mt, MD Inpatient   Advance Care Planning Activity    Advance Directive Documentation     Most Recent Value  Type of  Academic librarian  Pre-existing out of facility DNR order (yellow form or pink MOST form) --  "MOST" Form in Place? --     Family Communication: daughter at bedside  Disposition Plan:   Status is: Inpatient  Remains inpatient appropriate because:IV treatments appropriate due to intensity of illness or inability to take PO and Inpatient level of care appropriate due to severity of illness   Dispo: The patient is from: Home              Anticipated d/c is to: Home              Anticipated d/c date is: 2 days               Patient currently is not medically stable to d/c.       Consults called: None Admission status: Inpatient   Consultants:   None  Procedures:  CT Abdomen Pelvis W Contrast  Result Date: 07/15/2020 CLINICAL DATA:  Altered mental status. UTI symptoms. Recent diagnosis of CBD obstruction with suspected pancreatic cancer. 06/06/2020 ERCP with CBD stent placement and biopsy demonstrating no evidence of malignancy. EXAM: CT ABDOMEN AND PELVIS WITH CONTRAST TECHNIQUE: Multidetector CT imaging of the abdomen and pelvis was performed using the standard protocol following bolus administration of intravenous contrast. CONTRAST:  151mL OMNIPAQUE IOHEXOL 300 MG/ML  SOLN COMPARISON:  06/01/2020 CT abdomen/pelvis and MRI abdomen. FINDINGS: Lower chest: Scattered mild-to-moderate bronchiectasis at both lung bases, most prominent in the right middle lobe with chronic prominent right middle lobe scarring. Dependent bibasilar atelectasis. Coronary atherosclerosis. Hepatobiliary: Several (at least 5) new and enlarging hypodense liver masses scattered throughout the liver. Representative 2.1 cm segment 5 right liver mass (series 2/image 26), increased from 1.1 cm. Representative 2.2 cm segment 2 left liver mass (series 2/image 23), increased from 1.2 cm. Representative segment 6 right liver 0.8 cm mass (series 2/image 32), new. Pneumobilia in the otherwise normal gallbladder with no radiopaque cholelithiasis. CBD stent in place. Interval decompression of the intrahepatic bile ducts. Pneumobilia in the central intrahepatic bile ducts indicating stent patency. Pancreas: Ill-defined 1.9 x 1.5 cm hypodense masslike focus in pancreatic head adjacent to the descending duodenum (series 2/image 35), previously 1.9 x 1.5 cm, stable. Stable mild diffuse pancreatic duct dilation (4 mm diameter). Spleen: Normal size. No mass. Adrenals/Urinary Tract: Normal adrenals. No hydronephrosis. Scattered subcentimeter hypodense right renal  cortical lesions are too small to characterize and are unchanged, considered benign. Simple 1.4 cm interpolar left renal cyst. Borderline mild diffuse bladder wall thickening, slightly increased from prior CT. Mild urothelial wall thickening and enhancement throughout the right ureter is similar to prior. Stomach/Bowel: Normal non-distended stomach. Normal caliber small bowel with no small bowel wall thickening. Normal appendix. Mild scattered left colonic diverticulosis with no large bowel wall thickening or acute pericolonic fat stranding. Vascular/Lymphatic: Atherosclerotic nonaneurysmal abdominal aorta. Patent portal, splenic, hepatic and renal veins. No pathologically enlarged lymph nodes in the abdomen or pelvis. Reproductive: Status post hysterectomy, with no abnormal findings at the vaginal cuff. No adnexal mass. Other: No pneumoperitoneum. No focal fluid collections. Trace free fluid in the pelvic cul-de-sac. Musculoskeletal: No aggressive appearing focal osseous lesions. Chronic moderate T8, L1 and L3 vertebral compression fractures. Moderate thoracolumbar spondylosis. IMPRESSION: 1. Multiple new and enlarging hypodense liver masses, compatible with progression of liver metastases. 2. Stable ill-defined hypodense 1.9 cm masslike focus in the pancreatic head adjacent to the descending duodenum, likely the primary pancreatic malignancy. 3. Well position CBD stent  with interval decompression of the intrahepatic bile ducts. Pneumobilia in the central intrahepatic bile ducts indicating stent patency. 4. Borderline mild diffuse bladder wall thickening, slightly increased from prior CT. Nonspecific mild urothelial wall thickening and enhancement throughout the right ureter, similar to prior. Findings suggest acute cystitis with ascending right urinary tract infection. Suggest correlation with urinalysis. 5. Chronic mild-to-moderate bronchiectasis at the lung bases, most prominent in the right middle lobe. 6.  Coronary atherosclerosis. 7. Mild left colonic diverticulosis. 8. Trace free fluid in the pelvic cul-de-sac. 9. Chronic moderate T8, L1 and L3 vertebral compression fractures. 10. Aortic Atherosclerosis (ICD10-I70.0). Electronically Signed   By: Ilona Sorrel M.D.   On: 07/15/2020 12:23   DG Chest Port 1 View  Result Date: 07/15/2020 CLINICAL DATA:  86 year old female with history of fever and weakness. EXAM: PORTABLE CHEST 1 VIEW COMPARISON:  Chest x-ray 06/04/2020. FINDINGS: Lung volumes are low. Mild diffuse peribronchial cuffing. No consolidative airspace disease. No pleural effusions. No pneumothorax. No pulmonary nodule or mass noted. Pulmonary vasculature and the cardiomediastinal silhouette are within normal limits. Severe calcifications of the mitral annulus. Aortic atherosclerosis. IMPRESSION: 1. Mild diffuse peribronchial cuffing which could indicate an acute bronchitis. 2. Aortic atherosclerosis. 3. Severe calcifications of the mitral annulus. Electronically Signed   By: Vinnie Langton M.D.   On: 07/15/2020 09:33     Antimicrobials:   Ceftriaxone day 2 and vancomycin day 1    Subjective: Pt with significant fevers overnight, daughter at bedside  Objective: Vitals:   07/16/20 0048 07/16/20 0455 07/16/20 0839 07/16/20 1117  BP: (!) 112/52 (!) 108/49 (!) 128/52 (!) 92/46  Pulse: 82 83 87 81  Resp: 20 18 18 18   Temp: 99.2 F (37.3 C) 97.9 F (36.6 C) (!) 102.9 F (39.4 C) (!) 100.4 F (38 C)  TempSrc:   Oral Oral  SpO2: 97% 98% 94% 95%  Weight:      Height:        Intake/Output Summary (Last 24 hours) at 07/16/2020 1323 Last data filed at 07/16/2020 1158 Gross per 24 hour  Intake 0 ml  Output 400 ml  Net -400 ml   Filed Weights   07/15/20 2041  Weight: 55.8 kg    Examination:  General exam: Appears calm and comfortable  Respiratory system: Clear to auscultation. Respiratory effort normal. Cardiovascular system: S1 & S2 heard, RRR. No JVD, murmurs, rubs, gallops  or clicks. No pedal edema. Gastrointestinal system: Abdomen is nondistended, soft and nontender. No organomegaly or masses felt. Normal bowel sounds heard. Central nervous system: Alert and oriented. No focal neurological deficits. Extremities: wwp, trace edema. Skin: No rashes, lesions or ulcers Psychiatry: Judgement and insight appear normal. Mood & affect appropriate.     Data Reviewed: I have personally reviewed following labs and imaging studies  CBC: Recent Labs  Lab 07/15/20 0905 07/16/20 0417  WBC 5.7 7.7  NEUTROABS 4.8  --   HGB 11.5* 12.5  HCT 35.3* 39.3  MCV 98.9 98.7  PLT 224 354   Basic Metabolic Panel: Recent Labs  Lab 07/15/20 0905 07/16/20 0417  NA 141 139  K 3.5 2.5*  CL 97* 95*  CO2 33* 28  GLUCOSE 118* 111*  BUN 12 13  CREATININE 0.68 0.65  CALCIUM 8.7* 8.5*   GFR: Estimated Creatinine Clearance: 35.9 mL/min (by C-G formula based on SCr of 0.65 mg/dL). Liver Function Tests: Recent Labs  Lab 07/15/20 0905 07/16/20 0417  AST 34 28  ALT 21 19  ALKPHOS 74 69  BILITOT 1.3* 1.3*  PROT 6.8 6.2*  ALBUMIN 3.2* 2.8*   Recent Labs  Lab 07/15/20 0905  LIPASE 158*   No results for input(s): AMMONIA in the last 168 hours. Coagulation Profile: Recent Labs  Lab 07/15/20 0905  INR 1.0   Cardiac Enzymes: No results for input(s): CKTOTAL, CKMB, CKMBINDEX, TROPONINI in the last 168 hours. BNP (last 3 results) No results for input(s): PROBNP in the last 8760 hours. HbA1C: No results for input(s): HGBA1C in the last 72 hours. CBG: No results for input(s): GLUCAP in the last 168 hours. Lipid Profile: No results for input(s): CHOL, HDL, LDLCALC, TRIG, CHOLHDL, LDLDIRECT in the last 72 hours. Thyroid Function Tests: No results for input(s): TSH, T4TOTAL, FREET4, T3FREE, THYROIDAB in the last 72 hours. Anemia Panel: No results for input(s): VITAMINB12, FOLATE, FERRITIN, TIBC, IRON, RETICCTPCT in the last 72 hours. Sepsis Labs: Recent Labs  Lab  07/15/20 0905  LATICACIDVEN 1.5    Recent Results (from the past 240 hour(s))  Blood Culture (routine x 2)     Status: None (Preliminary result)   Collection Time: 07/15/20  9:05 AM   Specimen: BLOOD  Result Value Ref Range Status   Specimen Description   Final    BLOOD LEFT ARM Performed at [facility], [place] [address], [place], [place] [address]    Special Requests   Final    BOTTLES DRAWN AEROBIC ONLY Blood Culture results may not be optimal due to an inadequate volume of blood received in culture bottles Performed at [place] [address] [place], [place] [address]    Culture  Setup Time   Final    GRAM POSITIVE COCCI IN CLUSTERS AEROBIC BOTTLE ONLY Organism ID to follow CRITICAL RESULT CALLED TO, READ BACK BY AND VERIFIED WITH: Domenick Gong PHARMD, AT [id number] 07/16/20 BY Rush Landmark Performed at [facility], [place] [address] [place], [place] [address]    Culture GRAM POSITIVE COCCI  Final   Report Status PENDING  Incomplete  Blood Culture ID Panel (Reflexed)     Status: Abnormal   Collection Time: 07/15/20  9:05 AM  Result Value Ref Range Status   Enterococcus species NOT DETECTED NOT DETECTED Final   Listeria monocytogenes NOT DETECTED NOT DETECTED Final   Staphylococcus species DETECTED (A) NOT DETECTED Final    Comment: Methicillin (oxacillin) resistant coagulase negative staphylococcus. Possible blood culture contaminant (unless isolated from more than one blood culture draw or clinical case suggests pathogenicity). No antibiotic treatment is indicated for blood  culture contaminants. CRITICAL RESULT CALLED TO, READ BACK BY AND VERIFIED WITH: S. CHRISTY PHARMD, AT 0727 07/16/20 BY D. VANHOOK    Staphylococcus aureus (BCID) NOT DETECTED NOT DETECTED Final   Methicillin resistance DETECTED (A) NOT DETECTED Final    Comment: CRITICAL RESULT CALLED TO, READ BACK BY AND VERIFIED WITH: S. CHRISTY PHARMD, AT 5170 07/16/20 BY D.  VANHOOK    Streptococcus species NOT DETECTED NOT DETECTED Final   Streptococcus agalactiae NOT DETECTED NOT DETECTED Final   Streptococcus pneumoniae NOT DETECTED NOT DETECTED Final   Streptococcus pyogenes NOT DETECTED NOT DETECTED Final   Acinetobacter baumannii NOT DETECTED NOT DETECTED Final   Enterobacteriaceae species NOT DETECTED NOT DETECTED Final   Enterobacter cloacae complex NOT DETECTED NOT DETECTED Final   Escherichia coli NOT DETECTED NOT DETECTED Final   Klebsiella oxytoca NOT DETECTED NOT DETECTED Final   Klebsiella pneumoniae NOT DETECTED NOT DETECTED Final   Proteus species NOT DETECTED  NOT DETECTED Final   Serratia marcescens NOT DETECTED NOT DETECTED Final   Haemophilus influenzae NOT DETECTED NOT DETECTED Final   Neisseria meningitidis NOT DETECTED NOT DETECTED Final   Pseudomonas aeruginosa NOT DETECTED NOT DETECTED Final   Candida albicans NOT DETECTED NOT DETECTED Final   Candida glabrata NOT DETECTED NOT DETECTED Final   Candida krusei NOT DETECTED NOT DETECTED Final   Candida parapsilosis NOT DETECTED NOT DETECTED Final   Candida tropicalis NOT DETECTED NOT DETECTED Final    Comment: Performed at [facility], [place] [address], [place], [place] [address]  SARS Coronavirus 2 by RT PCR (hospital order, performed in [facility] hospital lab) Nasopharyngeal Nasopharyngeal Swab     Status: None   Collection Time: 07/15/20 12:36 PM   Specimen: Nasopharyngeal Swab  Result Value Ref Range Status   SARS Coronavirus 2 NEGATIVE NEGATIVE Final    Comment: (NOTE) SARS-CoV-2 target nucleic acids are NOT DETECTED.  The SARS-CoV-2 RNA is generally detectable in upper and lower respiratory specimens during the acute phase of infection. The lowest concentration of SARS-CoV-2 viral copies this assay can detect is 250 copies / mL. A negative result does not preclude SARS-CoV-2 infection and should not be used as the sole basis for treatment or other patient management  decisions.  A negative result may occur with improper specimen collection / handling, submission of specimen other than nasopharyngeal swab, presence of viral mutation(s) within the areas targeted by this assay, and inadequate number of viral copies (<250 copies / mL). A negative result must be combined with clinical observations, patient history, and epidemiological information.  Fact Sheet for Patients:   StrictlyIdeas.no  Fact Sheet for Healthcare Providers: BankingDealers.co.za  This test is not yet approved or  cleared by the [place] FDA and has been authorized for detection and/or diagnosis of SARS-CoV-2 by FDA under an Emergency Use Authorization (EUA).  This EUA will remain in effect (meaning this test can be used) for the duration of the COVID-19 declaration under Section 564(b)(1) of the Act, 21 U.S.C. section 360bbb-3(b)(1), unless the authorization is terminated or revoked sooner.  Performed at [facility], [place] [address] [place], [place] [address]          Radiology Studies: CT Abdomen Pelvis W Contrast  Result Date: 07/15/2020 CLINICAL DATA:  Altered mental status. UTI symptoms. Recent diagnosis of CBD obstruction with suspected pancreatic cancer. 06/06/2020 ERCP with CBD stent placement and biopsy demonstrating no evidence of malignancy. EXAM: CT ABDOMEN AND PELVIS WITH CONTRAST TECHNIQUE: Multidetector CT imaging of the abdomen and pelvis was performed using the standard protocol following bolus administration of intravenous contrast. CONTRAST:  165mL OMNIPAQUE IOHEXOL 300 MG/ML  SOLN COMPARISON:  06/01/2020 CT abdomen/pelvis and MRI abdomen. FINDINGS: Lower chest: Scattered mild-to-moderate bronchiectasis at both lung bases, most prominent in the right middle lobe with chronic prominent right middle lobe scarring. Dependent bibasilar atelectasis. Coronary atherosclerosis. Hepatobiliary: Several  (at least 5) new and enlarging hypodense liver masses scattered throughout the liver. Representative 2.1 cm segment 5 right liver mass (series 2/image 26), increased from 1.1 cm. Representative 2.2 cm segment 2 left liver mass (series 2/image 23), increased from 1.2 cm. Representative segment 6 right liver 0.8 cm mass (series 2/image 32), new. Pneumobilia in the otherwise normal gallbladder with no radiopaque cholelithiasis. CBD stent in place. Interval decompression of the intrahepatic bile ducts. Pneumobilia in the central intrahepatic bile ducts indicating stent patency. Pancreas: Ill-defined 1.9 x 1.5 cm hypodense masslike focus in  pancreatic head adjacent to the descending duodenum (series 2/image 35), previously 1.9 x 1.5 cm, stable. Stable mild diffuse pancreatic duct dilation (4 mm diameter). Spleen: Normal size. No mass. Adrenals/Urinary Tract: Normal adrenals. No hydronephrosis. Scattered subcentimeter hypodense right renal cortical lesions are too small to characterize and are unchanged, considered benign. Simple 1.4 cm interpolar left renal cyst. Borderline mild diffuse bladder wall thickening, slightly increased from prior CT. Mild urothelial wall thickening and enhancement throughout the right ureter is similar to prior. Stomach/Bowel: Normal non-distended stomach. Normal caliber small bowel with no small bowel wall thickening. Normal appendix. Mild scattered left colonic diverticulosis with no large bowel wall thickening or acute pericolonic fat stranding. Vascular/Lymphatic: Atherosclerotic nonaneurysmal abdominal aorta. Patent portal, splenic, hepatic and renal veins. No pathologically enlarged lymph nodes in the abdomen or pelvis. Reproductive: Status post hysterectomy, with no abnormal findings at the vaginal cuff. No adnexal mass. Other: No pneumoperitoneum. No focal fluid collections. Trace free fluid in the pelvic cul-de-sac. Musculoskeletal: No aggressive appearing focal osseous lesions.  Chronic moderate T8, L1 and L3 vertebral compression fractures. Moderate thoracolumbar spondylosis. IMPRESSION: 1. Multiple new and enlarging hypodense liver masses, compatible with progression of liver metastases. 2. Stable ill-defined hypodense 1.9 cm masslike focus in the pancreatic head adjacent to the descending duodenum, likely the primary pancreatic malignancy. 3. Well position CBD stent with interval decompression of the intrahepatic bile ducts. Pneumobilia in the central intrahepatic bile ducts indicating stent patency. 4. Borderline mild diffuse bladder wall thickening, slightly increased from prior CT. Nonspecific mild urothelial wall thickening and enhancement throughout the right ureter, similar to prior. Findings suggest acute cystitis with ascending right urinary tract infection. Suggest correlation with urinalysis. 5. Chronic mild-to-moderate bronchiectasis at the lung bases, most prominent in the right middle lobe. 6. Coronary atherosclerosis. 7. Mild left colonic diverticulosis. 8. Trace free fluid in the pelvic cul-de-sac. 9. Chronic moderate T8, L1 and L3 vertebral compression fractures. 10. Aortic Atherosclerosis (ICD10-I70.0). Electronically Signed   By: Ilona Sorrel M.D.   On: 07/15/2020 12:23   DG Chest Port 1 View  Result Date: 07/15/2020 CLINICAL DATA:  86 year old female with history of fever and weakness. EXAM: PORTABLE CHEST 1 VIEW COMPARISON:  Chest x-ray 06/04/2020. FINDINGS: Lung volumes are low. Mild diffuse peribronchial cuffing. No consolidative airspace disease. No pleural effusions. No pneumothorax. No pulmonary nodule or mass noted. Pulmonary vasculature and the cardiomediastinal silhouette are within normal limits. Severe calcifications of the mitral annulus. Aortic atherosclerosis. IMPRESSION: 1. Mild diffuse peribronchial cuffing which could indicate an acute bronchitis. 2. Aortic atherosclerosis. 3. Severe calcifications of the mitral annulus. Electronically Signed   By:  Vinnie Langton M.D.   On: 07/15/2020 09:33        Scheduled Meds: . bumetanide  0.5 mg Oral Daily  . donepezil  10 mg Oral QHS  . enoxaparin (LOVENOX) injection  40 mg Subcutaneous Q24H  . memantine  10 mg Oral Q12H  . pantoprazole  40 mg Oral Daily  . QUEtiapine  25 mg Oral QHS   Continuous Infusions: . cefTRIAXone (ROCEPHIN)  IV    . vancomycin 750 mg (07/16/20 0952)     LOS: 0 days    Time spent: 2 min    Nicolette Bang, MD [facility]  If 7PM-7AM, please contact night-coverage  07/16/2020, 1:23 PM

## 2020-07-16 NOTE — Progress Notes (Signed)
Pharmacy Antibiotic Note  [name] is a 86 y.o. female admitted on 07/15/2020 with bacteremia.  Pharmacy has been consulted for vanc dosing to be added to rocephin for fever of 102.9 and possible baceteremia  Plan: Vanc 750mg  IV q24 - goal trough 15-20 mcg/mL  Height: 5' (152.4 cm) Weight: 55.8 kg (123 lb) IBW/kg (Calculated) : 45.5  Temp (24hrs), Avg:99.3 F (37.4 C), Min:97.9 F (36.6 C), Max:102.9 F (39.4 C)  Recent Labs  Lab 07/15/20 0905 07/16/20 0417  WBC 5.7 7.7  CREATININE 0.68 0.65  LATICACIDVEN 1.5  --     Estimated Creatinine Clearance: 35.9 mL/min (by C-G formula based on SCr of 0.65 mg/dL).    Allergies  Allergen Reactions  . Sulfa Antibiotics Swelling and Other (See Comments)    Throat swelling  . Aspirin Other (See Comments)    Feels like choking  . Crestor [Rosuvastatin] Other (See Comments)    Lower Extremity Myalgia   . Bactrim Other (See Comments)    Unknown  . Demeclocycline Rash  . Niacin And Related Other (See Comments)    Turns red in the face  . Penicillins Other (See Comments)    Unknown Reaction Per The Patient Unknown  . Relafen [Nabumetone] Other (See Comments)    Unknown Reaction Per The Patient Unknown  . Tetracyclines & Related Rash     Thank you for allowing pharmacy to be a part of this patient's care.  Kara Mead 07/16/2020 9:11 AM

## 2020-07-17 LAB — CBC WITH DIFFERENTIAL/PLATELET
Abs Immature Granulocytes: 0.04 10*3/uL (ref 0.00–0.07)
Basophils Absolute: 0 10*3/uL (ref 0.0–0.1)
Basophils Relative: 1 %
Eosinophils Absolute: 0 10*3/uL (ref 0.0–0.5)
Eosinophils Relative: 0 %
HCT: 38 % (ref 36.0–46.0)
Hemoglobin: 12 g/dL (ref 12.0–15.0)
Immature Granulocytes: 1 %
Lymphocytes Relative: 10 %
Lymphs Abs: 0.8 10*3/uL (ref 0.7–4.0)
MCH: 31.3 pg (ref 26.0–34.0)
MCHC: 31.6 g/dL (ref 30.0–36.0)
MCV: 99.2 fL (ref 80.0–100.0)
Monocytes Absolute: 0.8 10*3/uL (ref 0.1–1.0)
Monocytes Relative: 10 %
Neutro Abs: 6.1 10*3/uL (ref 1.7–7.7)
Neutrophils Relative %: 78 %
Platelets: 222 10*3/uL (ref 150–400)
RBC: 3.83 MIL/uL — ABNORMAL LOW (ref 3.87–5.11)
RDW: 13.5 % (ref 11.5–15.5)
WBC: 7.8 10*3/uL (ref 4.0–10.5)
nRBC: 0 % (ref 0.0–0.2)

## 2020-07-17 LAB — CULTURE, BLOOD (ROUTINE X 2)

## 2020-07-17 LAB — BASIC METABOLIC PANEL
Anion gap: 14 (ref 5–15)
BUN: 13 mg/dL (ref 8–23)
CO2: 29 mmol/L (ref 22–32)
Calcium: 8.8 mg/dL — ABNORMAL LOW (ref 8.9–10.3)
Chloride: 98 mmol/L (ref 98–111)
Creatinine, Ser: 0.6 mg/dL (ref 0.44–1.00)
GFR calc Af Amer: >60 mL/min (ref >60)
GFR calc non Af Amer: >60 mL/min (ref >60)
Glucose, Bld: 114 mg/dL — ABNORMAL HIGH (ref 70–99)
Potassium: 2.8 mmol/L — ABNORMAL LOW (ref 3.5–5.1)
Sodium: 141 mmol/L (ref 135–145)

## 2020-07-17 MED ORDER — SODIUM CHLORIDE 0.9 % IV SOLN: INTRAVENOUS | Status: DC

## 2020-07-17 NOTE — Progress Notes (Signed)
PROGRESS NOTE    ADALEENA MOOERS  TKP:[id number] DOB: 19-Feb-1926 DOA: 07/15/2020 PCP: Robyne Peers, MD   Brief Narrative:  Per admitting: NTZ:GYFVCBS R Fulbrightis a 86 y.o.femalewith medical history significant ofCAD, TIA, recently discharged in 6/21 for acute cystitis and obstructive jaundice found to have new diagnosis of pancreatic head cancer with mets to liver s/p CBD stent placement, discharged to ALF with hospice. Over the past 2-3 days, patient noted to have increased confusion and high fevers. Patient was subsequently referred to ED for further work up. Denies chest pain. Does feel chills.  ED Course:In the ED, pt found to have UA with large leuks, pos nitrites, and >50 WBC's. Pt was given broad spectrum abx in the ED. Pt presented with daughter at bedside who had wished to have the treatable treated if possible, including infections. Hospitalist consulted for consideration for admission   Assessment & Plan:   Principal Problem:   UTI (urinary tract infection) Active Problems:   Hypercholesteremia   CAD (coronary artery disease)   HTN (hypertension)   Acute lower UTI   Sepsis ([place])   DNR (do not resuscitate)   Bacteremia   1. UTI/bacteremia with sepsis present on admission 1. Presents febrile to 103.41F with tachypnea and UA suggestive of UTI 2. Clinically impoved once abx were started in ED, at present afebrile 3. Re check/fup on urine and blood cx 4. Will cont on rocephin/vanc for now 5. Repeat cbc in AM 6. Added vanc 7/17, follows sensitivities-discussed with pharm 2. Pancreatic cancer, recent diagnosis 1. Noted on recent admit 2. Pt not candidate for treatment, currently at ALF with hospice following 3. DNR gold form at bedside 4. Cont treatment for UTI/Bacteremia above, given acute decompensation with sepsis, admitting consulted palliative care 3. CAD 1. Stable at this time 2. Denies chest pain 3. EKG nonischemic, reviewed 4. HTN 1. BP stable  at present 2. Cont to monitor  DVT prophylaxis: Lovenox SQ  Code Status: DNR    Code Status Orders  (From admission, onward)         Start     Ordered   07/15/20 1420  Do not attempt resuscitation (DNR)  Continuous       Question Answer Comment  In the event of cardiac or respiratory ARREST Do not call a code blue   In the event of cardiac or respiratory ARREST Do not perform Intubation, CPR, defibrillation or ACLS   In the event of cardiac or respiratory ARREST Use medication by any route, position, wound care, and other measures to relive pain and suffering. May use oxygen, suction and manual treatment of airway obstruction as needed for comfort.   Comments Confirmed with daughter (POA) at bedside      07/15/20 1423        Code Status History    Date Active Date Inactive Code Status Order ID Comments User Context   06/01/2020 1111 06/07/2020 1951 DNR [id number]  Little Ishikawa, MD ED   01/11/2020 0100 01/14/2020 2235 DNR [id number]  Rise Patience, MD ED   02/10/2019 1024 02/12/2019 1503 DNR [id number]  Barb Merino, MD ED   09/03/2017 2312 09/04/2017 1836 Full Code [id number]  Etta Quill, DO ED   07/30/2014 1339 07/31/2014 1641 Full Code [id number]  Debbe Odea, MD ED   07/30/2012 0843 07/31/2012 1956 Full Code [id number]  Street, Sharon [place], MD Inpatient   Advance Care Planning Activity    Advance Directive Documentation     Most Recent Value  Type of Advance Directive Healthcare Power of Attorney  Pre-existing out of facility DNR order (yellow form or pink MOST form) --  "MOST" Form in Place? --     Family Communication: DAUGHTER AT BEDSIDE Disposition Plan:   Status is: Inpatient  Remains inpatient appropriate because:IV treatments appropriate due to intensity of illness or inability to take PO and Inpatient level of care appropriate due to severity of illness   Dispo: The patient is from: ALF              Anticipated d/c is to: ALF              Anticipated  d/c date is: 2 days              Patient currently is not medically stable to d/c.       Consults called: None Admission status: Inpatient   Consultants:   None  Procedures:  CT Abdomen Pelvis W Contrast  Result Date: 07/15/2020 CLINICAL DATA:  Altered mental status. UTI symptoms. Recent diagnosis of CBD obstruction with suspected pancreatic cancer. 06/06/2020 ERCP with CBD stent placement and biopsy demonstrating no evidence of malignancy. EXAM: CT ABDOMEN AND PELVIS WITH CONTRAST TECHNIQUE: Multidetector CT imaging of the abdomen and pelvis was performed using the standard protocol following bolus administration of intravenous contrast. CONTRAST:  192mL OMNIPAQUE IOHEXOL 300 MG/ML  SOLN COMPARISON:  06/01/2020 CT abdomen/pelvis and MRI abdomen. FINDINGS: Lower chest: Scattered mild-to-moderate bronchiectasis at both lung bases, most prominent in the right middle lobe with chronic prominent right middle lobe scarring. Dependent bibasilar atelectasis. Coronary atherosclerosis. Hepatobiliary: Several (at least 5) new and enlarging hypodense liver masses scattered throughout the liver. Representative 2.1 cm segment 5 right liver mass (series 2/image 26), increased from 1.1 cm. Representative 2.2 cm segment 2 left liver mass (series 2/image 23), increased from 1.2 cm. Representative segment 6 right liver 0.8 cm mass (series 2/image 32), new. Pneumobilia in the otherwise normal gallbladder with no radiopaque cholelithiasis. CBD stent in place. Interval decompression of the intrahepatic bile ducts. Pneumobilia in the central intrahepatic bile ducts indicating stent patency. Pancreas: Ill-defined 1.9 x 1.5 cm hypodense masslike focus in pancreatic head adjacent to the descending duodenum (series 2/image 35), previously 1.9 x 1.5 cm, stable. Stable mild diffuse pancreatic duct dilation (4 mm diameter). Spleen: Normal size. No mass. Adrenals/Urinary Tract: Normal adrenals. No hydronephrosis. Scattered  subcentimeter hypodense right renal cortical lesions are too small to characterize and are unchanged, considered benign. Simple 1.4 cm interpolar left renal cyst. Borderline mild diffuse bladder wall thickening, slightly increased from prior CT. Mild urothelial wall thickening and enhancement throughout the right ureter is similar to prior. Stomach/Bowel: Normal non-distended stomach. Normal caliber small bowel with no small bowel wall thickening. Normal appendix. Mild scattered left colonic diverticulosis with no large bowel wall thickening or acute pericolonic fat stranding. Vascular/Lymphatic: Atherosclerotic nonaneurysmal abdominal aorta. Patent portal, splenic, hepatic and renal veins. No pathologically enlarged lymph nodes in the abdomen or pelvis. Reproductive: Status post hysterectomy, with no abnormal findings at the vaginal cuff. No adnexal mass. Other: No pneumoperitoneum. No focal fluid collections. Trace free fluid in the pelvic cul-de-sac. Musculoskeletal: No aggressive appearing focal osseous lesions. Chronic moderate T8, L1 and L3 vertebral compression fractures. Moderate thoracolumbar spondylosis. IMPRESSION: 1. Multiple new and enlarging hypodense liver masses, compatible with progression of liver metastases. 2. Stable ill-defined hypodense 1.9 cm masslike focus in the pancreatic head adjacent to the descending duodenum, likely the primary pancreatic malignancy. 3. Well position CBD  stent with interval decompression of the intrahepatic bile ducts. Pneumobilia in the central intrahepatic bile ducts indicating stent patency. 4. Borderline mild diffuse bladder wall thickening, slightly increased from prior CT. Nonspecific mild urothelial wall thickening and enhancement throughout the right ureter, similar to prior. Findings suggest acute cystitis with ascending right urinary tract infection. Suggest correlation with urinalysis. 5. Chronic mild-to-moderate bronchiectasis at the lung bases, most  prominent in the right middle lobe. 6. Coronary atherosclerosis. 7. Mild left colonic diverticulosis. 8. Trace free fluid in the pelvic cul-de-sac. 9. Chronic moderate T8, L1 and L3 vertebral compression fractures. 10. Aortic Atherosclerosis (ICD10-I70.0). Electronically Signed   By: Ilona Sorrel M.D.   On: 07/15/2020 12:23   DG Chest Port 1 View  Result Date: 07/15/2020 CLINICAL DATA:  86 year old female with history of fever and weakness. EXAM: PORTABLE CHEST 1 VIEW COMPARISON:  Chest x-ray 06/04/2020. FINDINGS: Lung volumes are low. Mild diffuse peribronchial cuffing. No consolidative airspace disease. No pleural effusions. No pneumothorax. No pulmonary nodule or mass noted. Pulmonary vasculature and the cardiomediastinal silhouette are within normal limits. Severe calcifications of the mitral annulus. Aortic atherosclerosis. IMPRESSION: 1. Mild diffuse peribronchial cuffing which could indicate an acute bronchitis. 2. Aortic atherosclerosis. 3. Severe calcifications of the mitral annulus. Electronically Signed   By: Vinnie Langton M.D.   On: 07/15/2020 09:33     Antimicrobials:   CEFTRIAXONE DAY 3   VANC DAY 2    Subjective: No acute changes overnight Still tired Fevers improving  Objective: Vitals:   07/16/20 1800 07/16/20 1807 07/16/20 2247 07/17/20 0317  BP:  (!) 139/59 (!) 121/53 (!) 130/56  Pulse:  91 98 82  Resp:   20 20  Temp: 97.7 F (36.5 C) 97.7 F (36.5 C) 97.9 F (36.6 C) 98.3 F (36.8 C)  TempSrc:  Oral    SpO2:  94% 92% 98%  Weight:      Height:        Intake/Output Summary (Last 24 hours) at 07/17/2020 1348 Last data filed at 07/17/2020 1231 Gross per 24 hour  Intake 396.33 ml  Output 1025 ml  Net -628.67 ml   Filed Weights   07/15/20 2041  Weight: 55.8 kg    Examination:  General exam: Appears calm and comfortable  Respiratory system: Clear to auscultation. Respiratory effort normal. Cardiovascular system: S1 & S2 heard, RRR. No JVD, murmurs,  rubs, gallops or clicks. No pedal edema. Gastrointestinal system: Abdomen is nondistended, soft and nontender. No organomegaly or masses felt. Normal bowel sounds heard. Central nervous system: Alert and oriented. No focal neurological deficits. Extremities: wwp, trace edema. Skin: No rashes, lesions or ulcers Psychiatry: Judgement and insight appear normal. Mood & affect appropriate.     Data Reviewed: I have personally reviewed following labs and imaging studies  CBC: Recent Labs  Lab 07/15/20 0905 07/16/20 0417 07/17/20 0454  WBC 5.7 7.7 7.8  NEUTROABS 4.8  --  6.1  HGB 11.5* 12.5 12.0  HCT 35.3* 39.3 38.0  MCV 98.9 98.7 99.2  PLT 224 228 481   Basic Metabolic Panel: Recent Labs  Lab 07/15/20 0905 07/16/20 0417 07/17/20 0454  NA 141 139 141  K 3.5 2.5* 2.8*  CL 97* 95* 98  CO2 33* 28 29  GLUCOSE 118* 111* 114*  BUN 12 13 13   CREATININE 0.68 0.65 0.60  CALCIUM 8.7* 8.5* 8.8*   GFR: Estimated Creatinine Clearance: 35.9 mL/min (by C-G formula based on SCr of 0.6 mg/dL). Liver Function Tests: Recent Labs  Lab  07/15/20 0905 07/16/20 0417  AST 34 28  ALT 21 19  ALKPHOS 74 69  BILITOT 1.3* 1.3*  PROT 6.8 6.2*  ALBUMIN 3.2* 2.8*   Recent Labs  Lab 07/15/20 0905  LIPASE 158*   No results for input(s): AMMONIA in the last 168 hours. Coagulation Profile: Recent Labs  Lab 07/15/20 0905  INR 1.0   Cardiac Enzymes: No results for input(s): CKTOTAL, CKMB, CKMBINDEX, TROPONINI in the last 168 hours. BNP (last 3 results) No results for input(s): PROBNP in the last 8760 hours. HbA1C: No results for input(s): HGBA1C in the last 72 hours. CBG: No results for input(s): GLUCAP in the last 168 hours. Lipid Profile: No results for input(s): CHOL, HDL, LDLCALC, TRIG, CHOLHDL, LDLDIRECT in the last 72 hours. Thyroid Function Tests: No results for input(s): TSH, T4TOTAL, FREET4, T3FREE, THYROIDAB in the last 72 hours. Anemia Panel: No results for input(s):  VITAMINB12, FOLATE, FERRITIN, TIBC, IRON, RETICCTPCT in the last 72 hours. Sepsis Labs: Recent Labs  Lab 07/15/20 0905  LATICACIDVEN 1.5    Recent Results (from the past 240 hour(s))  Blood Culture (routine x 2)     Status: Abnormal   Collection Time: 07/15/20  9:05 AM   Specimen: BLOOD  Result Value Ref Range Status   Specimen Description   Final    BLOOD LEFT ARM Performed at [facility] [address] [place], [place] [address]    Special Requests   Final    BOTTLES DRAWN AEROBIC ONLY Blood Culture results may not be optimal due to an inadequate volume of blood received in culture bottles Performed at [facility] [address] [place], [place] [address]    Culture  Setup Time   Final    GRAM POSITIVE COCCI IN CLUSTERS AEROBIC BOTTLE ONLY CRITICAL RESULT CALLED TO, READ BACK BY AND VERIFIED WITH: S. CHRISTY PHARMD, AT 3818 07/16/20 BY D. VANHOOK    Culture (A)  Final    STAPHYLOCOCCUS EPIDERMIDIS THE SIGNIFICANCE OF ISOLATING THIS ORGANISM FROM A SINGLE SET OF BLOOD CULTURES WHEN MULTIPLE SETS ARE DRAWN IS UNCERTAIN. PLEASE NOTIFY THE MICROBIOLOGY DEPARTMENT WITHIN ONE WEEK IF SPECIATION AND SENSITIVITIES ARE REQUIRED. Performed at [facility], [place] [address], [place], [place] [address]    Report Status 07/17/2020 FINAL  Final  Urine culture     Status: Abnormal (Preliminary result)   Collection Time: 07/15/20  9:05 AM   Specimen: In/Out Cath Urine  Result Value Ref Range Status   Specimen Description   Final    IN/OUT CATH URINE Performed at [facility] [address] [place], [place] [address]    Special Requests   Final    NONE Performed at [facility], [place] [address] [place], [place] [address]    Culture (A)  Final    20,000 COLONIES/mL ESCHERICHIA COLI SUSCEPTIBILITIES TO FOLLOW Performed at [facility], [place] [address], [place], [place] [address]    Report  Status PENDING  Incomplete  Blood Culture ID Panel (Reflexed)     Status: Abnormal   Collection Time: 07/15/20  9:05 AM  Result Value Ref Range Status   Enterococcus species NOT DETECTED NOT DETECTED Final   Listeria monocytogenes NOT DETECTED NOT DETECTED Final   Staphylococcus species DETECTED (A) NOT DETECTED Final    Comment: Methicillin (oxacillin) resistant coagulase negative staphylococcus. Possible blood culture contaminant (unless isolated from more than one blood culture draw or clinical case suggests pathogenicity).  No antibiotic treatment is indicated for blood  culture contaminants. CRITICAL RESULT CALLED TO, READ BACK BY AND VERIFIED WITH: S. CHRISTY PHARMD, AT 0727 07/16/20 BY D. VANHOOK    Staphylococcus aureus (BCID) NOT DETECTED NOT DETECTED Final   Methicillin resistance DETECTED (A) NOT DETECTED Final    Comment: CRITICAL RESULT CALLED TO, READ BACK BY AND VERIFIED WITH: S. CHRISTY PHARMD, AT 9629 07/16/20 BY D. VANHOOK    Streptococcus species NOT DETECTED NOT DETECTED Final   Streptococcus agalactiae NOT DETECTED NOT DETECTED Final   Streptococcus pneumoniae NOT DETECTED NOT DETECTED Final   Streptococcus pyogenes NOT DETECTED NOT DETECTED Final   Acinetobacter baumannii NOT DETECTED NOT DETECTED Final   Enterobacteriaceae species NOT DETECTED NOT DETECTED Final   Enterobacter cloacae complex NOT DETECTED NOT DETECTED Final   Escherichia coli NOT DETECTED NOT DETECTED Final   Klebsiella oxytoca NOT DETECTED NOT DETECTED Final   Klebsiella pneumoniae NOT DETECTED NOT DETECTED Final   Proteus species NOT DETECTED NOT DETECTED Final   Serratia marcescens NOT DETECTED NOT DETECTED Final   Haemophilus influenzae NOT DETECTED NOT DETECTED Final   Neisseria meningitidis NOT DETECTED NOT DETECTED Final   Pseudomonas aeruginosa NOT DETECTED NOT DETECTED Final   Candida albicans NOT DETECTED NOT DETECTED Final   Candida glabrata NOT DETECTED NOT DETECTED Final   Candida  krusei NOT DETECTED NOT DETECTED Final   Candida parapsilosis NOT DETECTED NOT DETECTED Final   Candida tropicalis NOT DETECTED NOT DETECTED Final    Comment: Performed at [facility], [place]. [address], [place], [place] [address]  SARS Coronavirus 2 by RT PCR (hospital order, performed in [facility] hospital lab) Nasopharyngeal Nasopharyngeal Swab     Status: None   Collection Time: 07/15/20 12:36 PM   Specimen: Nasopharyngeal Swab  Result Value Ref Range Status   SARS Coronavirus 2 NEGATIVE NEGATIVE Final    Comment: (NOTE) SARS-CoV-2 target nucleic acids are NOT DETECTED.  The SARS-CoV-2 RNA is generally detectable in upper and lower respiratory specimens during the acute phase of infection. The lowest concentration of SARS-CoV-2 viral copies this assay can detect is 250 copies / mL. A negative result does not preclude SARS-CoV-2 infection and should not be used as the sole basis for treatment or other patient management decisions.  A negative result may occur with improper specimen collection / handling, submission of specimen other than nasopharyngeal swab, presence of viral mutation(s) within the areas targeted by this assay, and inadequate number of viral copies (<250 copies / mL). A negative result must be combined with clinical observations, patient history, and epidemiological information.  Fact Sheet for Patients:   StrictlyIdeas.no  Fact Sheet for Healthcare Providers: BankingDealers.co.za  This test is not yet approved or  cleared by the [place] FDA and has been authorized for detection and/or diagnosis of SARS-CoV-2 by FDA under an Emergency Use Authorization (EUA).  This EUA will remain in effect (meaning this test can be used) for the duration of the COVID-19 declaration under Section 564(b)(1) of the Act, 21 U.S.C. section 360bbb-3(b)(1), unless the authorization is terminated or revoked sooner.  Performed  at [facility], [place] [address], [place], [place] [address]          Radiology Studies: No results found.      Scheduled Meds:  bumetanide  0.5 mg Oral Daily   donepezil  10 mg Oral QHS   enoxaparin (LOVENOX) injection  40 mg Subcutaneous Q24H   memantine  10 mg  Oral Q12H   pantoprazole  40 mg Oral Daily   QUEtiapine  25 mg Oral QHS   Continuous Infusions:  cefTRIAXone (ROCEPHIN)  IV 1 g (07/16/20 1435)   vancomycin 750 mg (07/17/20 1340)     LOS: 1 day    Time spent: 35 min    Nicolette Bang, MD [facility]  If 7PM-7AM, please contact night-coverage  07/17/2020, 1:48 PM

## 2020-07-17 NOTE — Progress Notes (Signed)
[facility]) Hospitalized Hospice Patient  Ms. Danser is a current hospice patient with a terminal diagnosis of pancreatic cancer. On 7/15, she was noted to be weaker and slightly more confused. Facility contacted hospice and her daughter, decision was made to reassess her this am. When daughter arrived at the facility, pt was hot to the touch and lethargic. Daughter requested she be transferred to the ED for further work up. She is admitted with sepsis. This is a related hospital admission per Dr. Konrad Dolores with [facility].  Pt more alert today. Still does not want to eat or drink much. She has been > 24 hours without fever. Dtr [name] not at bedside. Discussed ambiguous discharge plans. If she is not back to her baseline, she cannot return to her AL.   V/S:  99.5 oral, 131/56, HR 88, RR 98% on [place] I&O: 246/550 Labs: K 2.8, bcx obtained again and not resulted IVs/PRNs:  Rocephin 1g IV QD, NS @ 75 ml/hr, vancomycin 750 mg IV QD  Problem list: -urosepsis- felt to be source of infection, bcx obtained again, rocephin and vancomycin as above -pancreatic cancer - supportive treatment  D/C plans - ongoing, she cannot return to AL if she is not back to her baseline.  GOC - clear, DNR, no further escalation of care, no feeding tube/central lines/pressors IDT - team updated Family - updated  Venia Carbon RN, BSN, [facility] Liaison (in [place])

## 2020-07-17 NOTE — Progress Notes (Signed)
Patient transferred to Progressive unit 4,  Room 1415.

## 2020-07-17 NOTE — Progress Notes (Signed)
   07/17/20 2117  Assess: MEWS Score  Temp 99.1 F (37.3 C)  BP (!) 135/58  Pulse Rate 87  Resp (!) 30  Level of Consciousness Alert  SpO2 90 %  O2 Device Nasal Cannula  O2 Flow Rate (L/min) 4 L/min  Assess: MEWS Score  MEWS Temp 0  MEWS Systolic 0  MEWS Pulse 0  MEWS RR 2  MEWS LOC 0  MEWS Score 2  MEWS Score Color Yellow  Assess: if the MEWS score is Yellow or Red  Were vital signs taken at a resting state? Yes  Focused Assessment Documented focused assessment  Early Detection of Sepsis Score *See Row Information* Low  MEWS guidelines implemented *See Row Information* Yes  Treat  MEWS Interventions Administered scheduled meds/treatments (sat pt up in bed )  Take Vital Signs  Increase Vital Sign Frequency  Yellow: Q 2hr X 2 then Q 4hr X 2, if remains yellow, continue Q 4hrs  Escalate  MEWS: Escalate Yellow: discuss with charge nurse/RN and consider discussing with provider and RRT  Notify: Charge Nurse/RN  Name of Charge Nurse/RN Notified Raquel Sarna RN   Date Charge Nurse/RN Notified 07/17/20  Time Charge Nurse/RN Notified 2117  RR elevated, pt states shortness of breath. Pt sat up right, repositioned in bed, comfort measures checked, no pain. Will monitor VS per YELLOW MEWS protocol.

## 2020-07-18 LAB — CBC WITH DIFFERENTIAL/PLATELET
Abs Immature Granulocytes: 0.04 10*3/uL (ref 0.00–0.07)
Basophils Absolute: 0 10*3/uL (ref 0.0–0.1)
Basophils Relative: 0 %
Eosinophils Absolute: 0 10*3/uL (ref 0.0–0.5)
Eosinophils Relative: 0 %
HCT: 34.7 % — ABNORMAL LOW (ref 36.0–46.0)
Hemoglobin: 11 g/dL — ABNORMAL LOW (ref 12.0–15.0)
Immature Granulocytes: 1 %
Lymphocytes Relative: 12 %
Lymphs Abs: 0.9 10*3/uL (ref 0.7–4.0)
MCH: 31.4 pg (ref 26.0–34.0)
MCHC: 31.7 g/dL (ref 30.0–36.0)
MCV: 99.1 fL (ref 80.0–100.0)
Monocytes Absolute: 0.9 10*3/uL (ref 0.1–1.0)
Monocytes Relative: 12 %
Neutro Abs: 5.6 10*3/uL (ref 1.7–7.7)
Neutrophils Relative %: 75 %
Platelets: 186 10*3/uL (ref 150–400)
RBC: 3.5 MIL/uL — ABNORMAL LOW (ref 3.87–5.11)
RDW: 13.3 % (ref 11.5–15.5)
WBC: 7.4 10*3/uL (ref 4.0–10.5)
nRBC: 0 % (ref 0.0–0.2)

## 2020-07-18 LAB — URINE CULTURE
Culture: 20000 — AB
Culture: NO GROWTH

## 2020-07-18 LAB — BASIC METABOLIC PANEL
Anion gap: 10 (ref 5–15)
BUN: 15 mg/dL (ref 8–23)
CO2: 31 mmol/L (ref 22–32)
Calcium: 8.7 mg/dL — ABNORMAL LOW (ref 8.9–10.3)
Chloride: 100 mmol/L (ref 98–111)
Creatinine, Ser: 0.58 mg/dL (ref 0.44–1.00)
GFR calc Af Amer: >60 mL/min (ref >60)
GFR calc non Af Amer: >60 mL/min (ref >60)
Glucose, Bld: 123 mg/dL — ABNORMAL HIGH (ref 70–99)
Potassium: 2.5 mmol/L — CL (ref 3.5–5.1)
Sodium: 141 mmol/L (ref 135–145)

## 2020-07-18 MED ORDER — MORPHINE SULFATE (PF) 2 MG/ML IV SOLN: 2.0000 mg | INTRAVENOUS | Status: DC | PRN

## 2020-07-18 MED ORDER — LORAZEPAM 1 MG PO TABS: 1.0000 mg | ORAL_TABLET | ORAL | Status: DC | PRN

## 2020-07-18 MED ORDER — ACETAMINOPHEN 650 MG RE SUPP: 650.0000 mg | Freq: Four times a day (QID) | RECTAL | Status: DC | PRN

## 2020-07-18 MED ORDER — GLYCOPYRROLATE 0.2 MG/ML IJ SOLN
0.2000 mg | INTRAMUSCULAR | Status: DC | PRN
  Filled 2020-07-18: qty 1

## 2020-07-18 MED ORDER — ACETAMINOPHEN 325 MG PO TABS: 650.0000 mg | ORAL_TABLET | Freq: Four times a day (QID) | ORAL | Status: DC | PRN

## 2020-07-18 MED ORDER — DIPHENHYDRAMINE HCL 50 MG/ML IJ SOLN: 12.5000 mg | INTRAMUSCULAR | Status: DC | PRN

## 2020-07-18 MED ORDER — POTASSIUM CHLORIDE CRYS ER 20 MEQ PO TBCR
40.0000 meq | EXTENDED_RELEASE_TABLET | Freq: Three times a day (TID) | ORAL | Status: DC
  Administered 2020-07-18: 40 meq via ORAL
  Filled 2020-07-18: qty 2

## 2020-07-18 MED ORDER — HALOPERIDOL LACTATE 2 MG/ML PO CONC
2.0000 mg | ORAL | Status: DC | PRN
  Filled 2020-07-18: qty 1

## 2020-07-18 MED ORDER — HALOPERIDOL LACTATE 5 MG/ML IJ SOLN: 2.0000 mg | INTRAMUSCULAR | Status: DC | PRN

## 2020-07-18 MED ORDER — MORPHINE SULFATE (CONCENTRATE) 10 MG/0.5ML PO SOLN: 5.0000 mg | ORAL | Status: DC | PRN

## 2020-07-18 MED ORDER — LORAZEPAM 2 MG/ML PO CONC
1.0000 mg | ORAL | Status: DC | PRN
  Filled 2020-07-18: qty 0.5

## 2020-07-18 MED ORDER — LORAZEPAM 2 MG/ML IJ SOLN: 1.0000 mg | INTRAMUSCULAR | Status: DC | PRN

## 2020-07-18 MED ORDER — GLYCOPYRROLATE 1 MG PO TABS
1.0000 mg | ORAL_TABLET | ORAL | Status: DC | PRN
  Filled 2020-07-18: qty 1

## 2020-07-18 MED ORDER — ALBUTEROL SULFATE (2.5 MG/3ML) 0.083% IN NEBU: 2.5000 mg | INHALATION_SOLUTION | RESPIRATORY_TRACT | Status: DC | PRN

## 2020-07-18 MED ORDER — HALOPERIDOL 2 MG PO TABS
2.0000 mg | ORAL_TABLET | ORAL | Status: DC | PRN
  Filled 2020-07-18: qty 1

## 2020-07-18 MED ORDER — ONDANSETRON 4 MG PO TBDP: 4.0000 mg | ORAL_TABLET | Freq: Four times a day (QID) | ORAL | Status: DC | PRN

## 2020-07-18 MED ORDER — ONDANSETRON HCL 4 MG/2ML IJ SOLN: 4.0000 mg | Freq: Four times a day (QID) | INTRAMUSCULAR | Status: DC | PRN

## 2020-07-18 MED ORDER — POTASSIUM CHLORIDE CRYS ER 20 MEQ PO TBCR
40.0000 meq | EXTENDED_RELEASE_TABLET | ORAL | Status: DC
  Administered 2020-07-18: 40 meq via ORAL
  Filled 2020-07-18: qty 2

## 2020-07-18 MED ORDER — POLYVINYL ALCOHOL 1.4 % OP SOLN: 1.0000 [drp] | Freq: Four times a day (QID) | OPHTHALMIC | Status: DC | PRN

## 2020-07-18 NOTE — Discharge Summary (Signed)
[facility] Discharge Summary   Patient: [name] HYW:[id number]  PCP: Robyne Peers, MD  Date of admission: 07/15/2020   Date of discharge:  07/18/2020     Discharge Diagnoses:  Principal Problem:   UTI (urinary tract infection) Active Problems:   Hypercholesteremia   CAD (coronary artery disease)   HTN (hypertension)   Acute lower UTI   Sepsis ([place])   DNR (do not resuscitate)   Bacteremia  Admitted From: home Disposition:   Residential hospice  Recommendations for Outpatient Follow-up:  1. PCP: establish care with hospice 2. Follow up LABS/TEST:  noen   Follow-up Information    Robyne Peers, MD Follow up.   Specialty: Family Medicine Contact information: [address] [place] [address] [phone]              Diet recommendation: Regular diet  Activity: The patient is advised to gradually reintroduce usual activities, as tolerated  Discharge Condition: stable  Code Status: DNR   History of present illness: As per the H and P dictated on admission, "[name] is a 86 y.o. female with medical history significant of CAD, TIA, recently discharged in 6/21 for acute cystitis and obstructive jaundice found to have new diagnosis of pancreatic head cancer with mets to liver s/p CBD stent placement, discharged to ALF with hospice. Over the past 2-3 days, patient noted to have increased confusion and high fevers. Patient was subsequently referred to ED for further work up. Denies chest pain. Does feel chills.  ED Course: In the ED, pt found to have UA with large leuks, pos nitrites, and >50 WBC's. Pt was given broad spectrum abx in the ED. Pt presented with daughter at bedside who had wished to have the treatable treated if possible, including infections. Hospitalist consulted for consideration for admission"  Hospital Course:  Presents with UTI.  Treated with IV antibiotics.  Initial plan is to treat what is treatable  with the limitation of hospice and DNR.  Patient's condition somewhat improved but still with no p.o. intake and now with a new stroke.  Family prefers rotation to comfort care and residential hospice.  Summary of her active problems in the hospital is as following.   1.  UTI Sepsis secondary to E. coli UTI. Staph epidermis bacteremia 1 out of 4 likely contaminant. Acute hypoxic respiratory failure On admission temp 103, respiratory rate 22, acute hypoxic respiratory failure and acute metabolic encephalopathy. Currently improving with treatment of sepsis physiology has still not resolved. At this is despite 3 days of antibiotics. Currently family's goal is to transition to comfort.  2.  Acute CVA History of CVA patient has history of CVA was on Plavix prior to transition to hospice Presented with acute metabolic encephalopathy. Currently encephalopathy has resolved but still not back to baseline. On my evaluation patient has right eye ptosis with right-sided temporal hemianopsia.  Suspect she is suffering from an acute stroke. Given that the family's goal is relation to comfort as well as stay away from Plavix currently further work-up will not add to patient's treatment. Continue comfort care.  3.  Metastatic pancreatic cancer. Diagnosed in June 01, 2020. Back then there were no significant liver metastasis. CT scan performed this admission shows multiple liver nodules concerning for metastatic disease. Patient was transitioned to hospice care at the time of the discharge in June. Patient has a pancreatic stent. LFTs were stable. Pain controlled.  4.  Goals of care conversation. Patient was  already active with home hospice at ALF. Now presents with sepsis due to UTI as well as acute CVA. Poor p.o. intake. Family's goal now is to focus only on comfort. Patient will be ideally residential hospice candidate. Transition to complete comfort care.  5.  Chronic diastolic  CHF. HTN Patient is on Bumex. Currently discontinued given that the patient is having poor p.o. intake.  6.  CAD. Currently stable bronchial monitor.  7.  Hypokalemia. Currently being corrected. Now on comfort care.  8.  Acute hypoxic respiratory failure. POA. Oxygen currently improving.  CT scan shows evidence of bronchiectasis POA. Now comfort care.  9. T8 compression fracture. Continue pain control.   On the day of the discharge the patient's vitals were stable, and no other acute medical condition were reported by patient. the patient was felt safe to be discharge at residential hospice  Consultants: none Procedures: none  Discharge Exam: General: Appear in mild distress, no Rash; Oral Mucosa Clear, dry. Cardiovascular: S1 and S2 Present, aortic systolic  Murmur, Respiratory: normal respiratory effort, Bilateral Air entry present and no Crackles, no wheezes Abdomen: Bowel Sound present, Soft and mild tenderness, no hernia Extremities: no Pedal edema, no calf tenderness Neurology: alert and oriented to place and person affect appropriate. Mental status AAOx3, speech normal, attention normal,  Cranial Nerves PERRL, EOM abnormal with limitation of right ward movement, also ptosis on right eye Hemianopsia on right temporal vision.  Motor strength bilateral equal strength 5/5,  Sensation present to light touch,  Cerebellar test normal finger nose finger.   Filed Weights   07/15/20 2041  Weight: 55.8 kg   Vitals:   07/18/20 0551 07/18/20 0910  BP:  (!) 100/40  Pulse: 66 75  Resp:  (!) 21  Temp:  97.9 F (36.6 C)  SpO2: 98% 98%    DISCHARGE MEDICATION: Allergies as of 07/18/2020      Reactions   Sulfa Antibiotics Swelling, Other (See Comments)   Throat swelling   Aspirin Other (See Comments)   Feels like choking   Crestor [rosuvastatin] Other (See Comments)   Lower Extremity Myalgia   Bactrim Other (See Comments)   Unknown   Demeclocycline Rash    Niacin And Related Other (See Comments)   Turns red in the face   Penicillins Other (See Comments)   Unknown Reaction Per The Patient Unknown   Relafen [nabumetone] Other (See Comments)   Unknown Reaction Per The Patient Unknown   Tetracyclines & Related Rash      Medication List    STOP taking these medications   Align 4 MG Caps   bumetanide 0.5 MG tablet Commonly known as: BUMEX   potassium chloride SA 20 MEQ tablet Commonly known as: KLOR-CON   Proctozone-HC 2.5 % rectal cream Generic drug: hydrocortisone     TAKE these medications   acetaminophen 500 MG tablet Commonly known as: TYLENOL Take 1,000 mg by mouth 2 (two) times daily as needed for mild pain.   ANTI-DIARRHEAL PO Take 2 tablets by mouth 4 (four) times daily as needed (diarrhea).   donepezil 10 MG tablet Commonly known as: ARICEPT Take 1 tablet (10 mg total) by mouth at bedtime.   furosemide 20 MG tablet Commonly known as: LASIX Take 20 mg by mouth daily as needed (2lb weight gain in 1 day or 5lbs in one week).   LORazepam 0.5 MG tablet Commonly known as: ATIVAN Take 1 tablet (0.5 mg total) by mouth 2 (two) times daily as needed for anxiety  or sleep.   memantine 10 MG tablet Commonly known as: NAMENDA Take 1 tablet (10 mg total) by mouth 2 (two) times daily.   nystatin-triamcinolone cream Commonly known as: MYCOLOG II Apply 1 application topically 2 (two) times daily as needed (atrophic vaginitis). Apply topically to vaginal area   ondansetron 8 MG tablet Commonly known as: ZOFRAN Take 8 mg by mouth See admin instructions. 8mg  oral twice daily   And 8mg  every 8 hours as needed for nausea/vomitting   pantoprazole 40 MG tablet Commonly known as: PROTONIX Take 40 mg by mouth daily.   QUEtiapine 25 MG tablet Commonly known as: SEROQUEL Take 25 mg by mouth at bedtime.   SYSTANE OP Place 2 drops into both eyes 2 (two) times daily.   traMADol 50 MG tablet Commonly known as: ULTRAM Take 1  tablet (50 mg total) by mouth every 8 (eight) hours as needed for moderate pain or severe pain.      Allergies  Allergen Reactions  . Sulfa Antibiotics Swelling and Other (See Comments)    Throat swelling  . Aspirin Other (See Comments)    Feels like choking  . Crestor [Rosuvastatin] Other (See Comments)    Lower Extremity Myalgia   . Bactrim Other (See Comments)    Unknown  . Demeclocycline Rash  . Niacin And Related Other (See Comments)    Turns red in the face  . Penicillins Other (See Comments)    Unknown Reaction Per The Patient Unknown  . Relafen [Nabumetone] Other (See Comments)    Unknown Reaction Per The Patient Unknown  . Tetracyclines & Related Rash   Discharge Instructions    Diet - low sodium heart healthy   Complete by: As directed    Increase activity slowly   Complete by: As directed       The results of significant diagnostics from this hospitalization (including imaging, microbiology, ancillary and laboratory) are listed below for reference.    Significant Diagnostic Studies: CT Abdomen Pelvis W Contrast  Result Date: 07/15/2020 CLINICAL DATA:  Altered mental status. UTI symptoms. Recent diagnosis of CBD obstruction with suspected pancreatic cancer. 06/06/2020 ERCP with CBD stent placement and biopsy demonstrating no evidence of malignancy. EXAM: CT ABDOMEN AND PELVIS WITH CONTRAST TECHNIQUE: Multidetector CT imaging of the abdomen and pelvis was performed using the standard protocol following bolus administration of intravenous contrast. CONTRAST:  125mL OMNIPAQUE IOHEXOL 300 MG/ML  SOLN COMPARISON:  06/01/2020 CT abdomen/pelvis and MRI abdomen. FINDINGS: Lower chest: Scattered mild-to-moderate bronchiectasis at both lung bases, most prominent in the right middle lobe with chronic prominent right middle lobe scarring. Dependent bibasilar atelectasis. Coronary atherosclerosis. Hepatobiliary: Several (at least 5) new and enlarging hypodense liver masses  scattered throughout the liver. Representative 2.1 cm segment 5 right liver mass (series 2/image 26), increased from 1.1 cm. Representative 2.2 cm segment 2 left liver mass (series 2/image 23), increased from 1.2 cm. Representative segment 6 right liver 0.8 cm mass (series 2/image 32), new. Pneumobilia in the otherwise normal gallbladder with no radiopaque cholelithiasis. CBD stent in place. Interval decompression of the intrahepatic bile ducts. Pneumobilia in the central intrahepatic bile ducts indicating stent patency. Pancreas: Ill-defined 1.9 x 1.5 cm hypodense masslike focus in pancreatic head adjacent to the descending duodenum (series 2/image 35), previously 1.9 x 1.5 cm, stable. Stable mild diffuse pancreatic duct dilation (4 mm diameter). Spleen: Normal size. No mass. Adrenals/Urinary Tract: Normal adrenals. No hydronephrosis. Scattered subcentimeter hypodense right renal cortical lesions are too small to characterize and  are unchanged, considered benign. Simple 1.4 cm interpolar left renal cyst. Borderline mild diffuse bladder wall thickening, slightly increased from prior CT. Mild urothelial wall thickening and enhancement throughout the right ureter is similar to prior. Stomach/Bowel: Normal non-distended stomach. Normal caliber small bowel with no small bowel wall thickening. Normal appendix. Mild scattered left colonic diverticulosis with no large bowel wall thickening or acute pericolonic fat stranding. Vascular/Lymphatic: Atherosclerotic nonaneurysmal abdominal aorta. Patent portal, splenic, hepatic and renal veins. No pathologically enlarged lymph nodes in the abdomen or pelvis. Reproductive: Status post hysterectomy, with no abnormal findings at the vaginal cuff. No adnexal mass. Other: No pneumoperitoneum. No focal fluid collections. Trace free fluid in the pelvic cul-de-sac. Musculoskeletal: No aggressive appearing focal osseous lesions. Chronic moderate T8, L1 and L3 vertebral compression  fractures. Moderate thoracolumbar spondylosis. IMPRESSION: 1. Multiple new and enlarging hypodense liver masses, compatible with progression of liver metastases. 2. Stable ill-defined hypodense 1.9 cm masslike focus in the pancreatic head adjacent to the descending duodenum, likely the primary pancreatic malignancy. 3. Well position CBD stent with interval decompression of the intrahepatic bile ducts. Pneumobilia in the central intrahepatic bile ducts indicating stent patency. 4. Borderline mild diffuse bladder wall thickening, slightly increased from prior CT. Nonspecific mild urothelial wall thickening and enhancement throughout the right ureter, similar to prior. Findings suggest acute cystitis with ascending right urinary tract infection. Suggest correlation with urinalysis. 5. Chronic mild-to-moderate bronchiectasis at the lung bases, most prominent in the right middle lobe. 6. Coronary atherosclerosis. 7. Mild left colonic diverticulosis. 8. Trace free fluid in the pelvic cul-de-sac. 9. Chronic moderate T8, L1 and L3 vertebral compression fractures. 10. Aortic Atherosclerosis (ICD10-I70.0). Electronically Signed   By: Ilona Sorrel M.D.   On: 07/15/2020 12:23   DG Chest Port 1 View  Result Date: 07/15/2020 CLINICAL DATA:  86 year old female with history of fever and weakness. EXAM: PORTABLE CHEST 1 VIEW COMPARISON:  Chest x-ray 06/04/2020. FINDINGS: Lung volumes are low. Mild diffuse peribronchial cuffing. No consolidative airspace disease. No pleural effusions. No pneumothorax. No pulmonary nodule or mass noted. Pulmonary vasculature and the cardiomediastinal silhouette are within normal limits. Severe calcifications of the mitral annulus. Aortic atherosclerosis. IMPRESSION: 1. Mild diffuse peribronchial cuffing which could indicate an acute bronchitis. 2. Aortic atherosclerosis. 3. Severe calcifications of the mitral annulus. Electronically Signed   By: Vinnie Langton M.D.   On: 07/15/2020 09:33     Microbiology: Recent Results (from the past 240 hour(s))  Blood Culture (routine x 2)     Status: Abnormal   Collection Time: 07/15/20  9:05 AM   Specimen: BLOOD  Result Value Ref Range Status   Specimen Description   Final    BLOOD LEFT ARM Performed at [facility] [address], [place], [place] [address]    Special Requests   Final    BOTTLES DRAWN AEROBIC ONLY Blood Culture results may not be optimal due to an inadequate volume of blood received in culture bottles Performed at [facility] [address] [place], [place] [address]    Culture  Setup Time   Final    GRAM POSITIVE COCCI IN CLUSTERS AEROBIC BOTTLE ONLY CRITICAL RESULT CALLED TO, READ BACK BY AND VERIFIED WITH: S. CHRISTY PHARMD, AT 0981 07/16/20 BY D. VANHOOK    Culture (A)  Final    STAPHYLOCOCCUS EPIDERMIDIS THE SIGNIFICANCE OF ISOLATING THIS ORGANISM FROM A SINGLE SET OF BLOOD CULTURES WHEN MULTIPLE SETS ARE DRAWN IS UNCERTAIN. PLEASE NOTIFY THE MICROBIOLOGY DEPARTMENT WITHIN  ONE WEEK IF SPECIATION AND SENSITIVITIES ARE REQUIRED. Performed at [facility], [place] [address] [place], [place] [address]    Report Status 07/17/2020 FINAL  Final  Urine culture     Status: Abnormal   Collection Time: 07/15/20  9:05 AM   Specimen: In/Out Cath Urine  Result Value Ref Range Status   Specimen Description   Final    IN/OUT CATH URINE Performed at [facility] [address], [place], [place] [address]    Special Requests   Final    NONE Performed at [facility], [place] [address], [place], [place] [address]    Culture 20,000 COLONIES/mL ESCHERICHIA COLI (A)  Final   Report Status 07/18/2020 FINAL  Final   Organism ID, Bacteria ESCHERICHIA COLI (A)  Final      Susceptibility   Escherichia coli - MIC*    AMPICILLIN <=2 SENSITIVE Sensitive     CEFAZOLIN <=4 SENSITIVE Sensitive     CEFTRIAXONE <=0.25 SENSITIVE Sensitive      CIPROFLOXACIN <=0.25 SENSITIVE Sensitive     GENTAMICIN <=1 SENSITIVE Sensitive     IMIPENEM <=0.25 SENSITIVE Sensitive     NITROFURANTOIN <=16 SENSITIVE Sensitive     TRIMETH/SULFA <=20 SENSITIVE Sensitive     AMPICILLIN/SULBACTAM <=2 SENSITIVE Sensitive     PIP/TAZO <=4 SENSITIVE Sensitive     * 20,000 COLONIES/mL ESCHERICHIA COLI  Blood Culture ID Panel (Reflexed)     Status: Abnormal   Collection Time: 07/15/20  9:05 AM  Result Value Ref Range Status   Enterococcus species NOT DETECTED NOT DETECTED Final   Listeria monocytogenes NOT DETECTED NOT DETECTED Final   Staphylococcus species DETECTED (A) NOT DETECTED Final    Comment: Methicillin (oxacillin) resistant coagulase negative staphylococcus. Possible blood culture contaminant (unless isolated from more than one blood culture draw or clinical case suggests pathogenicity). No antibiotic treatment is indicated for blood  culture contaminants. CRITICAL RESULT CALLED TO, READ BACK BY AND VERIFIED WITH: S. CHRISTY PHARMD, AT 0727 07/16/20 BY D. VANHOOK    Staphylococcus aureus (BCID) NOT DETECTED NOT DETECTED Final   Methicillin resistance DETECTED (A) NOT DETECTED Final    Comment: CRITICAL RESULT CALLED TO, READ BACK BY AND VERIFIED WITH: S. CHRISTY PHARMD, AT 6979 07/16/20 BY D. VANHOOK    Streptococcus species NOT DETECTED NOT DETECTED Final   Streptococcus agalactiae NOT DETECTED NOT DETECTED Final   Streptococcus pneumoniae NOT DETECTED NOT DETECTED Final   Streptococcus pyogenes NOT DETECTED NOT DETECTED Final   Acinetobacter baumannii NOT DETECTED NOT DETECTED Final   Enterobacteriaceae species NOT DETECTED NOT DETECTED Final   Enterobacter cloacae complex NOT DETECTED NOT DETECTED Final   Escherichia coli NOT DETECTED NOT DETECTED Final   Klebsiella oxytoca NOT DETECTED NOT DETECTED Final   Klebsiella pneumoniae NOT DETECTED NOT DETECTED Final   Proteus species NOT DETECTED NOT DETECTED Final   Serratia marcescens NOT  DETECTED NOT DETECTED Final   Haemophilus influenzae NOT DETECTED NOT DETECTED Final   Neisseria meningitidis NOT DETECTED NOT DETECTED Final   Pseudomonas aeruginosa NOT DETECTED NOT DETECTED Final   Candida albicans NOT DETECTED NOT DETECTED Final   Candida glabrata NOT DETECTED NOT DETECTED Final   Candida krusei NOT DETECTED NOT DETECTED Final   Candida parapsilosis NOT DETECTED NOT DETECTED Final   Candida tropicalis NOT DETECTED NOT DETECTED Final    Comment: Performed at [facility], [place]. [address] [place], [place] [address]  SARS Coronavirus 2 by RT PCR (  hospital order, performed in [facility] hospital lab) Nasopharyngeal Nasopharyngeal Swab     Status: None   Collection Time: 07/15/20 12:36 PM   Specimen: Nasopharyngeal Swab  Result Value Ref Range Status   SARS Coronavirus 2 NEGATIVE NEGATIVE Final    Comment: (NOTE) SARS-CoV-2 target nucleic acids are NOT DETECTED.  The SARS-CoV-2 RNA is generally detectable in upper and lower respiratory specimens during the acute phase of infection. The lowest concentration of SARS-CoV-2 viral copies this assay can detect is 250 copies / mL. A negative result does not preclude SARS-CoV-2 infection and should not be used as the sole basis for treatment or other patient management decisions.  A negative result may occur with improper specimen collection / handling, submission of specimen other than nasopharyngeal swab, presence of viral mutation(s) within the areas targeted by this assay, and inadequate number of viral copies (<250 copies / mL). A negative result must be combined with clinical observations, patient history, and epidemiological information.  Fact Sheet for Patients:   StrictlyIdeas.no  Fact Sheet for Healthcare Providers: BankingDealers.co.za  This test is not yet approved or  cleared by the [place] FDA and has been authorized for detection and/or diagnosis of  SARS-CoV-2 by FDA under an Emergency Use Authorization (EUA).  This EUA will remain in effect (meaning this test can be used) for the duration of the COVID-19 declaration under Section 564(b)(1) of the Act, 21 U.S.C. section 360bbb-3(b)(1), unless the authorization is terminated or revoked sooner.  Performed at [facility], [place] [address], [place], [place] [address]   Culture, Urine     Status: None   Collection Time: 07/17/20  8:24 AM   Specimen: Urine, Random  Result Value Ref Range Status   Specimen Description   Final    URINE, RANDOM Performed at [facility] [address], [place], [place] [address]    Special Requests   Final    NONE Performed at [facility], [place] [address], [place], [place] [address]    Culture   Final    NO GROWTH Performed at [facility], [place] [address] [place],  [address]    Report Status 07/18/2020 FINAL  Final     Labs: CBC: Recent Labs  Lab 07/15/20 0905 07/16/20 0417 07/17/20 0454 07/18/20 0444  WBC 5.7 7.7 7.8 7.4  NEUTROABS 4.8  --  6.1 5.6  HGB 11.5* 12.5 12.0 11.0*  HCT 35.3* 39.3 38.0 34.7*  MCV 98.9 98.7 99.2 99.1  PLT 224 228 222 638   Basic Metabolic Panel: Recent Labs  Lab 07/15/20 0905 07/16/20 0417 07/17/20 0454 07/18/20 0444  NA 141 139 141 141  K 3.5 2.5* 2.8* 2.5*  CL 97* 95* 98 100  CO2 33* 28 29 31   GLUCOSE 118* 111* 114* 123*  BUN 12 13 13 15   CREATININE 0.68 0.65 0.60 0.58  CALCIUM 8.7* 8.5* 8.8* 8.7*   Liver Function Tests: Recent Labs  Lab 07/15/20 0905 07/16/20 0417  AST 34 28  ALT 21 19  ALKPHOS 74 69  BILITOT 1.3* 1.3*  PROT 6.8 6.2*  ALBUMIN 3.2* 2.8*   Recent Labs  Lab 07/15/20 0905  LIPASE 158*   No results for input(s): AMMONIA in the last 168 hours. Cardiac Enzymes: No results for input(s): CKTOTAL, CKMB, CKMBINDEX, TROPONINI in the last 168 hours. BNP (last 3 results) Recent Labs    06/01/20 0750    BNP 684.2*   CBG: No results for  input(s): GLUCAP in the last 168 hours.  Time spent: 35 minutes  Signed:  Berle Mull  [facility]  07/18/2020 11:06 AM

## 2020-07-18 NOTE — Progress Notes (Signed)
Manufacturing engineer [facility]) Hospital Liaison note.     Received request from [place] for referral to [facility]. Chart reviewed and eligibility confirmed. Spoke with family to confirm interest. Family agreeable to transfer today. TOC aware.     RN please call report to [phone].   Thank you,     Farrel Gordon, RN, CCM       [facility] (listed on [place] [facility])     [phone]

## 2020-07-18 NOTE — Progress Notes (Signed)
   07/18/20 0910  Assess: MEWS Score  Temp 97.9 F (36.6 C)  BP (!) 100/40 (RN notified)  Pulse Rate 75  Resp (!) 21  SpO2 98 %  Assess: MEWS Score  MEWS Temp 0  MEWS Systolic 1  MEWS Pulse 0  MEWS RR 1  MEWS LOC 0  MEWS Score 2  MEWS Score Color Yellow  Assess: if the MEWS score is Yellow or Red  Were vital signs taken at a resting state? Yes  Focused Assessment No change from prior assessment  Early Detection of Sepsis Score *See Row Information* High  MEWS guidelines implemented *See Row Information* Yes  Treat  MEWS Interventions Administered scheduled meds/treatments  Pain Scale 0-10  Pain Score 0  Take Vital Signs  Increase Vital Sign Frequency  Yellow: Q 2hr X 2 then Q 4hr X 2, if remains yellow, continue Q 4hrs  Notify: Provider  Provider Name/Title Berle Mull MD  Date Provider Notified 07/18/20  Time Provider Notified [phone]  Notification Type Face-to-face  Notification Reason Change in status (returned to yellow mews)  Response No new orders (restart yellow mews protocol)  Date of Provider Response 07/18/20  Time of Provider Response [phone]  Document  Patient Outcome Other (Comment) (pt comfort care; dc to [facility] when bed available)   MD notified of Yellow MEWS status and BP 100/40.  Pt has been transitioned to comfort care and awaiting DC to [facility] when bed becomes available.

## 2020-07-18 NOTE — TOC Initial Note (Addendum)
Transition of Care [facility]) - Initial/Assessment Note    Patient Details  Name: [name] MRN: [id number] Date of Birth: 08-27-1926  Transition of Care [facility]) CM/SW Contact:    Trish Mage, LCSW Phone Number: 07/18/2020, 10:14 AM  Clinical Narrative:  Received message from MD stating he is recommending hospice facility care for patient with recent stroke while hospitalized.  Spoke with patient and daughter to confirm plan.   Left message for Bevely Palmer at [facility] with referral for residential hospice. TOC will continue to follow during the course of hospitalization.  Addendum I: Received message from Bevely Palmer stating they have a bed at [facility].              Expected Discharge Plan: [place] Barriers to Discharge: Hospice Bed not available   Patient Goals and CMS Choice        Expected Discharge Plan and Services Expected Discharge Plan: [place]                                              Prior Living Arrangements/Services                       Activities of Daily Living Home Assistive Devices/Equipment: Blood pressure cuff, Grab bars around toilet, Grab bars in shower, Hand-held shower hose, Eyeglasses, Hospital bed, Scales, Walker (specify type) (front wheeled walker, single point cane) ADL Screening (condition at time of admission) Patient's cognitive ability adequate to safely complete daily activities?: No Is the patient deaf or have difficulty hearing?: Yes Does the patient have difficulty seeing, even when wearing glasses/contacts?: No Does the patient have difficulty concentrating, remembering, or making decisions?: Yes Patient able to express need for assistance with ADLs?: No Does the patient have difficulty dressing or bathing?: Yes Independently performs ADLs?: No Communication: Independent Dressing (OT): Dependent Is this a change from baseline?: Change from baseline, expected to last >3  days Grooming: Dependent Is this a change from baseline?: Change from baseline, expected to last >3 days Feeding: Dependent Is this a change from baseline?: Change from baseline, expected to last >3 days Bathing: Dependent Is this a change from baseline?: Change from baseline, expected to last >3 days Toileting: Dependent Is this a change from baseline?: Change from baseline, expected to last >3days In/Out Bed: Dependent Is this a change from baseline?: Change from baseline, expected to last >3 days Walks in Home: Dependent Is this a change from baseline?: Change from baseline, expected to last >3 days Does the patient have difficulty walking or climbing stairs?: Yes (secondary to weakness) Weakness of Legs: Both Weakness of Arms/Hands: Both  Permission Sought/Granted                  Emotional Assessment              Admission diagnosis:  UTI (urinary tract infection) [N39.0] SIRS (systemic inflammatory response syndrome) ([place]) [R65.10] Urinary tract infection with hematuria, site unspecified [N39.0, R31.9] Primary pancreatic cancer with metastasis to other site [facility]) [C25.9] Bacteremia [R78.81] Patient Active Problem List   Diagnosis Date Noted  . Bacteremia 07/16/2020  . UTI (urinary tract infection) 07/15/2020  . DNR (do not resuscitate) 07/15/2020  . Sepsis secondary to UTI ([place]) 06/01/2020  . Elevated bilirubin 06/01/2020  . CAP (community acquired pneumonia) 01/11/2020  . Sepsis ([place])  01/10/2020  . Alzheimer's disease ([place]) 07/02/2019  . Acute lower UTI 02/10/2019  . Acute metabolic encephalopathy 54/65/6812  . Encephalopathy acute 02/10/2019  . Chest pain, rule out acute myocardial infarction 09/03/2017  . Heart palpitations 07/31/2016  . Neck pain, musculoskeletal 04/26/2015  . Variants of migraine, not elsewhere classified, without mention of intractable migraine without mention of status migrainosus 08/17/2014  . Tension headache 08/17/2014  . TIA  (transient ischemic attack) 07/30/2014  . HTN (hypertension) 07/30/2014  . Aortic aneurysm, thoracic ([place]) 06/25/2014  . Chest pain 07/29/2012  . CAD (coronary artery disease) 06/03/2012  . Hypercholesteremia   . GERD (gastroesophageal reflux disease)    PCP:  Robyne Peers, MD Pharmacy:   [facility] [name], [place] - [id number] [place] [address] Phone: [phone] Fax: [phone]     Social Determinants of Health (SDOH) Interventions    Readmission Risk Interventions No flowsheet data found.

## 2020-07-18 NOTE — Progress Notes (Addendum)
CRITICAL VALUE ALERT  Critical Value:  K+ 2.5   Date & Time Notied:  07/18/2020  Provider Notified: Kennon Holter NP   Orders Received/Actions taken: replacements ordered see orders

## 2020-07-18 NOTE — Plan of Care (Signed)
  Problem: Clinical Measurements: Goal: Ability to maintain clinical measurements within normal limits will improve Outcome: Progressing Goal: Will remain free from infection Outcome: Progressing Goal: Diagnostic test results will improve Outcome: Progressing Goal: Respiratory complications will improve Outcome: Progressing Goal: Cardiovascular complication will be avoided Outcome: Progressing   Problem: Coping: Goal: Level of anxiety will decrease Outcome: Progressing   Problem: Elimination: Goal: Will not experience complications related to bowel motility Outcome: Progressing Goal: Will not experience complications related to urinary retention Outcome: Progressing   Problem: Pain Managment: Goal: General experience of comfort will improve Outcome: Progressing   Problem: Safety: Goal: Ability to remain free from injury will improve Outcome: Progressing   Problem: Skin Integrity: Goal: Risk for impaired skin integrity will decrease Outcome: Progressing   

## 2020-07-18 NOTE — TOC Transition Note (Addendum)
Transition of Care [facility]) - CM/SW Discharge Note   Patient Details  Name: [name] MRN: [id number] Date of Birth: 1926/08/05  Transition of Care [facility]) CM/SW Contact:  Trish Mage, LCSW Phone Number: 07/18/2020, 12:14 PM   Clinical Narrative:   Patient to transition to [facility] today.  [facility] arranged.  Nursing, please call report to [phone]. TOC sign off.      Barriers to Discharge: Barriers Resolved   Patient Goals and CMS Choice        Discharge Placement                       Discharge Plan and Services                                     Social Determinants of Health (SDOH) Interventions     Readmission Risk Interventions No flowsheet data found.

## 2020-07-22 LAB — CULTURE, BLOOD (ROUTINE X 2)
Culture: NO GROWTH
Culture: NO GROWTH
Special Requests: ADEQUATE
Special Requests: ADEQUATE

## 2020-08-03 DIAGNOSIS — Z20828 Contact with and (suspected) exposure to other viral communicable diseases: Secondary | ICD-10-CM | POA: Diagnosis not present

## 2020-08-03 DIAGNOSIS — Z1159 Encounter for screening for other viral diseases: Secondary | ICD-10-CM | POA: Diagnosis not present

## 2020-08-26 IMAGING — CR DG CHEST 2V
2 series · 2 of 2 positions shown · non-contrast
Comparison: Chest x-ray 08/13/2018.

CLINICAL DATA: [AGE] female with history of fever. Confusion.
Fall.

EXAM:
CHEST - 2 VIEW

[chest lat]
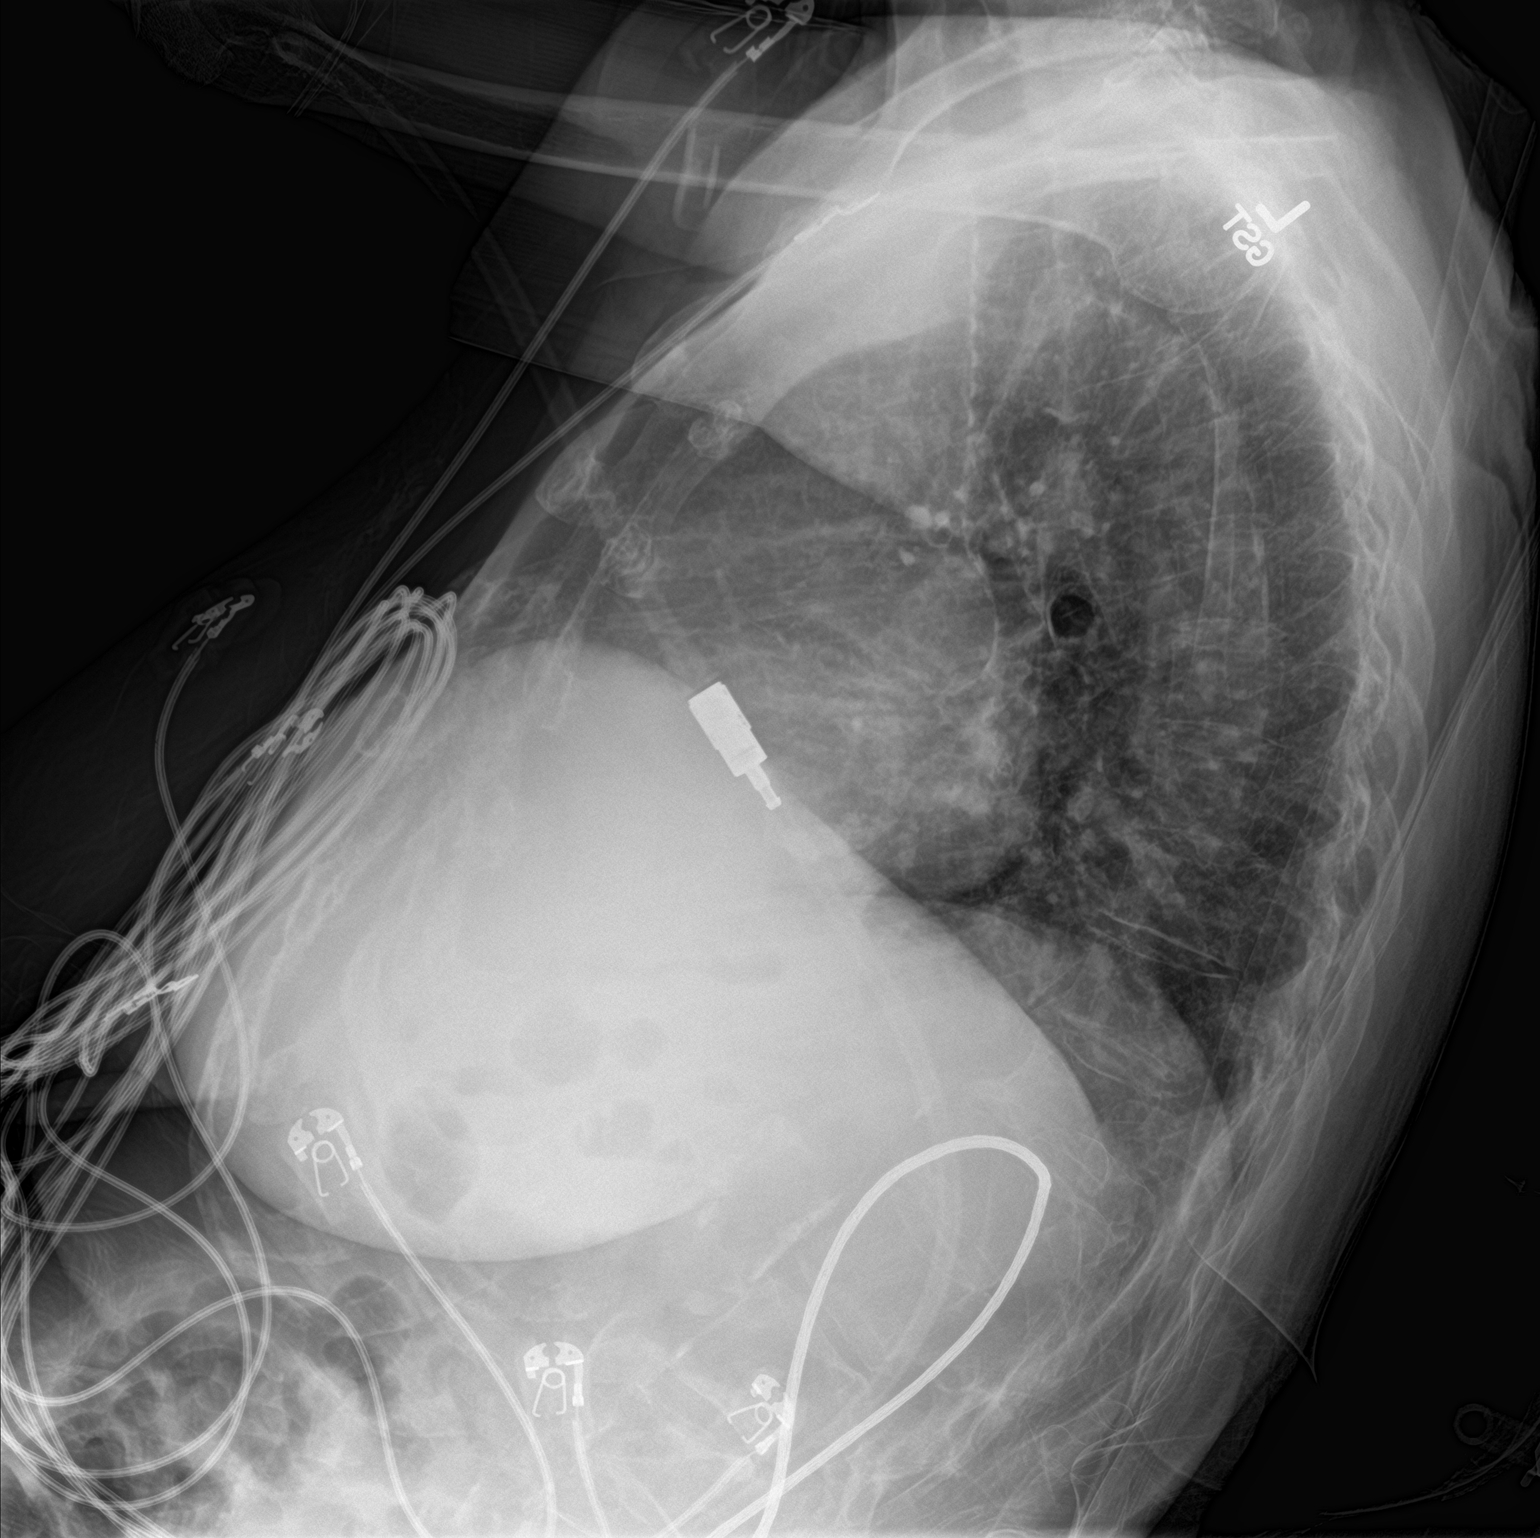

[chest ap]
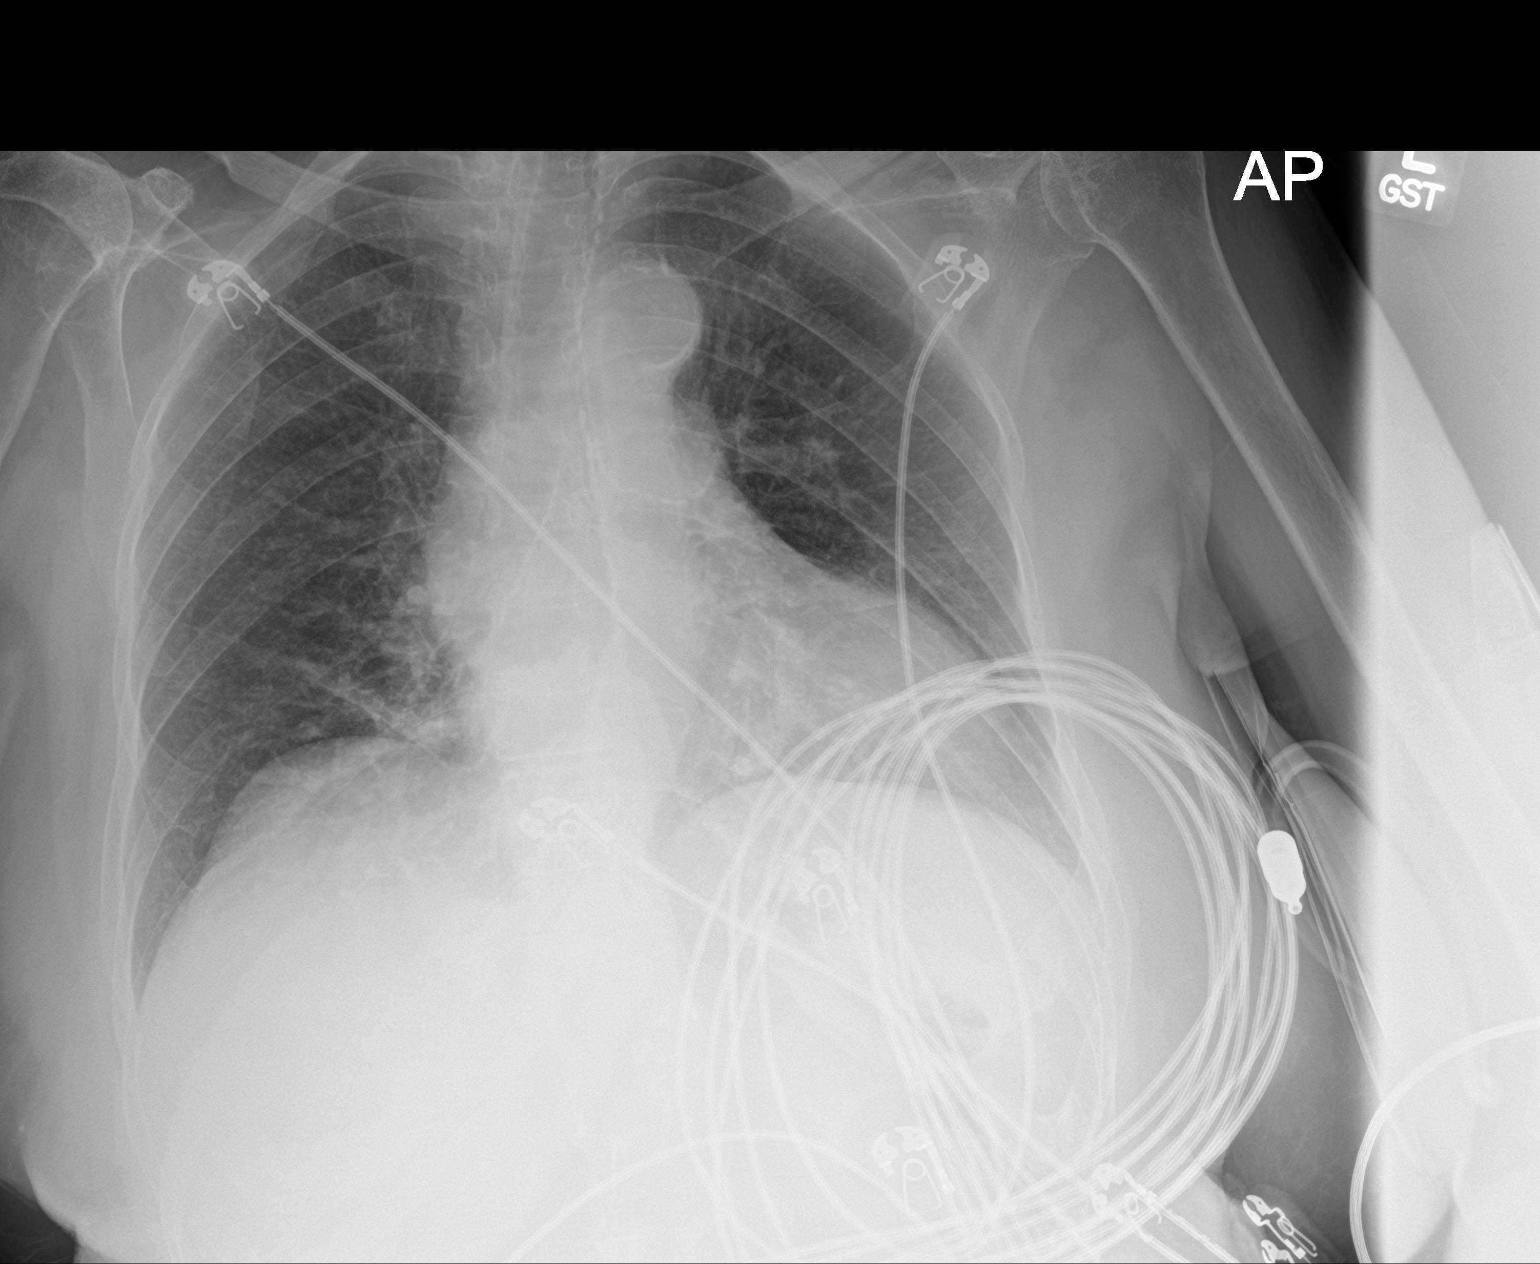

[2 of 2 positions shown; findings below may reference images not displayed]

FINDINGS: Lung volumes are normal. No consolidative airspace disease. No
pleural effusions. No pneumothorax. No pulmonary nodule or mass
noted. Severe calcifications of the mitral annulus. Pulmonary
vasculature and the cardiomediastinal silhouette are within normal
limits. Aortic atherosclerosis.
IMPRESSION: 1. No radiographic evidence of acute cardiopulmonary disease.
2. Aortic atherosclerosis.

## 2020-08-31 DEATH — deceased

## 2021-12-16 IMAGING — MR MR ABDOMEN WO/W CM MRCP
15 of 21 series · 33 of 48 positions shown · IV contrast (gadavist)
Comparison: No prior abdominal MRI. CT the abdomen and pelvis
06/01/2020.

CLINICAL DATA: [AGE] female with history of jaundice.
Abdominal pain. Shortness of breath.

EXAM:
MRI ABDOMEN WITHOUT AND WITH CONTRAST (INCLUDING MRCP)
TECHNIQUE: Multiplanar multisequence MR imaging of the abdomen was performed
both before and after the administration of intravenous contrast.
Heavily T2-weighted images of the biliary and pancreatic ducts were
obtained, and three-dimensional MRCP images were rendered by post
processing.
CONTRAST:  7mL GADAVIST GADOBUTROL 1 MMOL/ML IV SOLN

[Series 3: T2 fat-sat · axial · 6.0mm · 1.09mm/px · 1 of 38 slices shown]
[im 1/38]
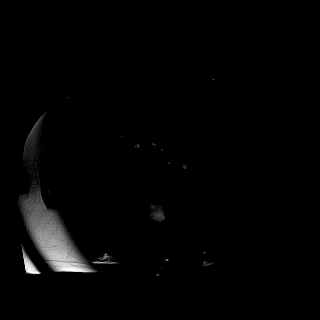

[Series 5: DWI · axial · 6.0mm · 1.36mm/px · z∈[-19,+269]mm · 2 of 82 slices shown (1 of 2)]
[im 1/82]
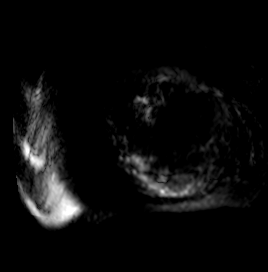
[im 82/82]
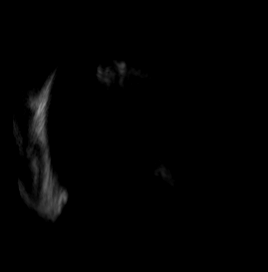

[Series 6: DWI · axial · 6.0mm · 1.36mm/px · 1 of 41 slices shown (2 of 2)]
[im 1/41]
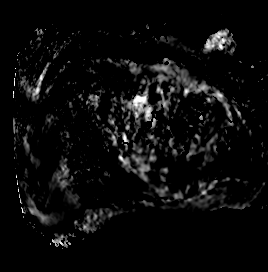

[Series 7: T1 · axial · 3.0mm · 1.02mm/px · z∈[-16,+269]mm · 3 of 96 slices shown (1 of 2)]
[im 1/96]
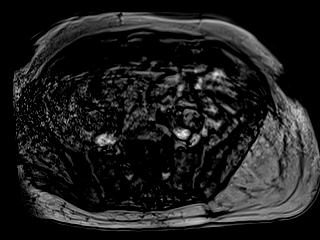
[im 48/96]
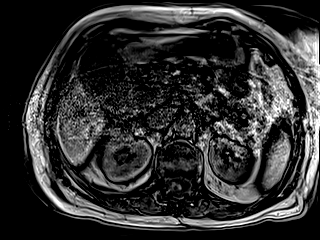
[im 96/96]
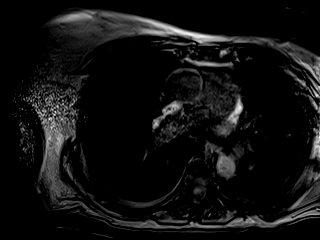

[Series 8: T1 · axial · 3.0mm · 1.02mm/px · z∈[-16,+269]mm · 3 of 96 slices shown (2 of 2)]
[im 1/96]
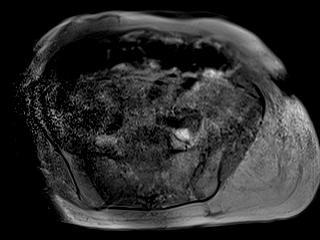
[im 48/96]
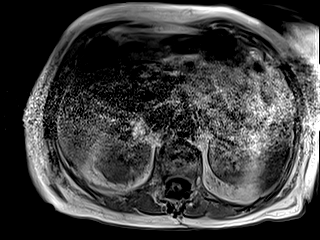
[im 96/96]
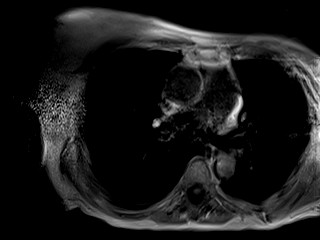

[Series 9: T2 · axial · 6.0mm · 1.27mm/px · 1 of 41 slices shown (1 of 2)]
[im 1/41]
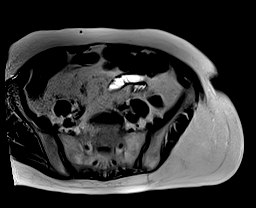

[Series 13: T2 · coronal · 6.0mm · 1.27mm/px · 1 of 30 slices shown (2 of 2)]
[im 1/30]
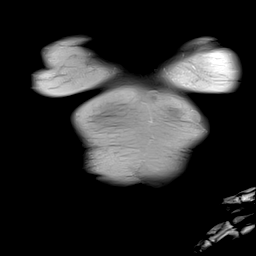

[Series 14: cor obl thk · sagittal · 50.0mm · 0.78mm/px · 1 of 9 slices shown]
[im 1/9]
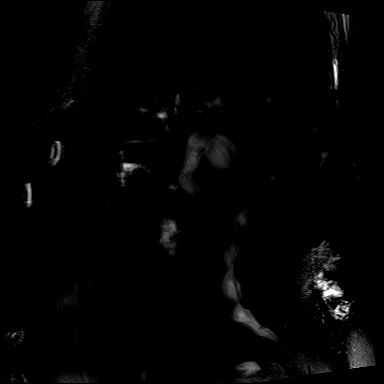

[Series 15: cor_3d_spc_trig · coronal · 1.0mm · 0.42mm/px · 3 of 104 slices shown]
[im 1/104]
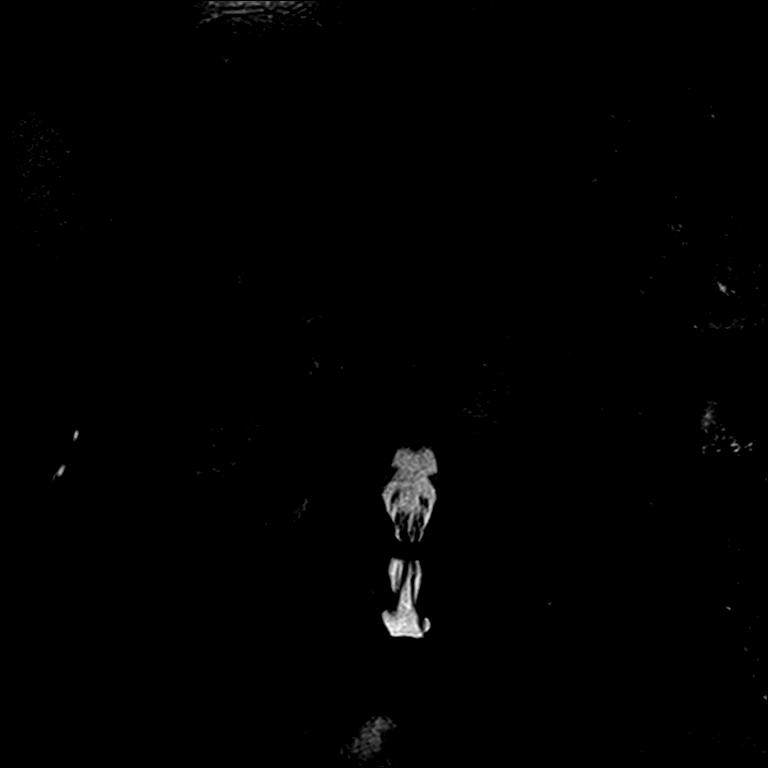
[im 52/104]
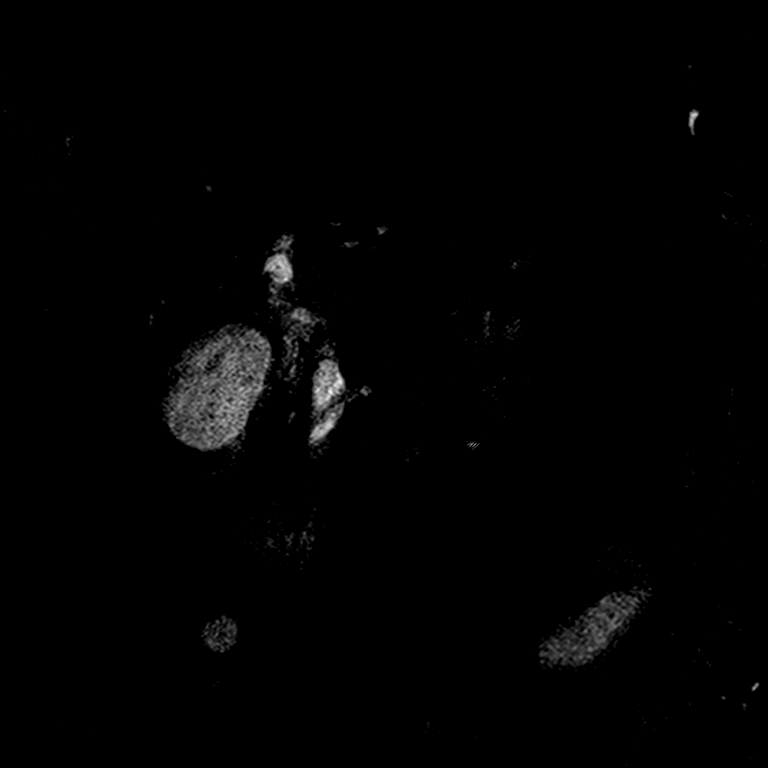
[im 104/104]
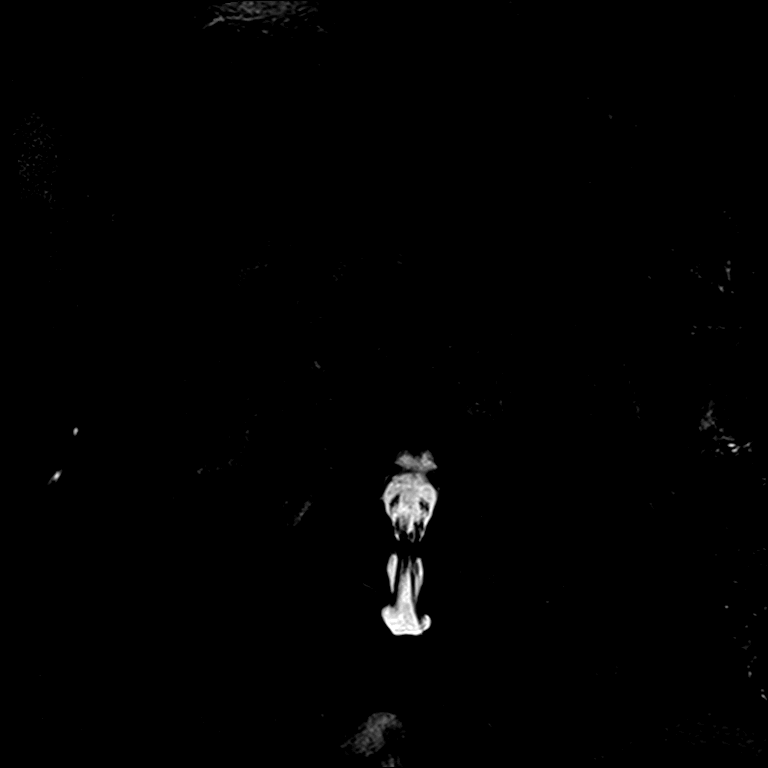

[Series 18: T1 dynamic · axial · 3.0mm · 1.02mm/px · z∈[-11,+296]mm · 3 of 104 slices shown (1 of 6)]
[im 1/104]
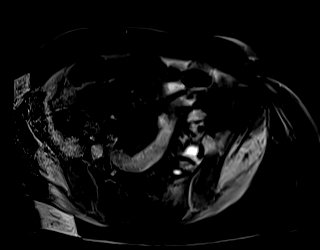
[im 52/104]
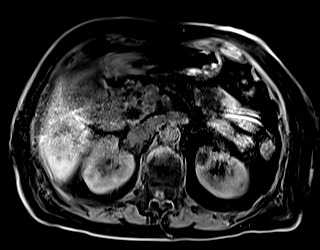
[im 104/104]
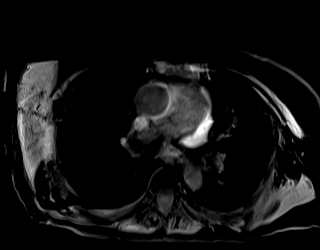

[Series 21: T1 dynamic · axial · 3.0mm · 1.02mm/px · z∈[-11,+296]mm · 3 of 104 slices shown (2 of 6)]
[im 1/104]
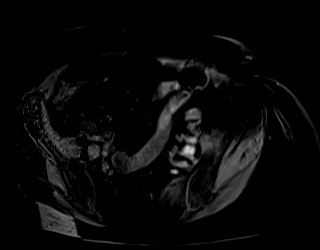
[im 52/104]
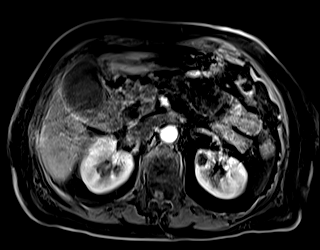
[im 104/104]
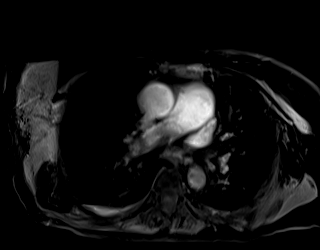

[Series 23: T1 dynamic · axial · 3.0mm · 1.02mm/px · z∈[-11,+296]mm · 3 of 104 slices shown (3 of 6)]
[im 1/104]
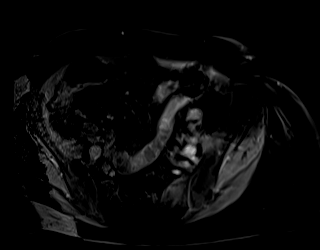
[im 52/104]
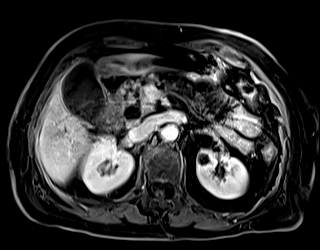
[im 104/104]
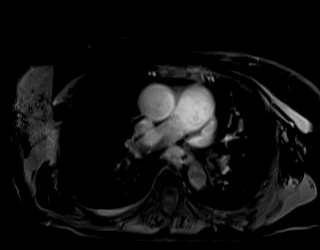

[Series 25: T1 dynamic · axial · 3.0mm · 1.02mm/px · z∈[-11,+296]mm · 3 of 104 slices shown (4 of 6)]
[im 1/104]
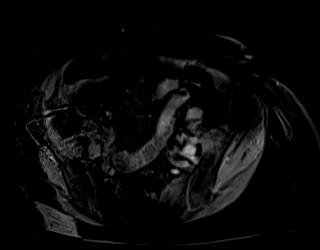
[im 52/104]
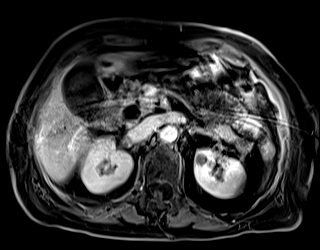
[im 104/104]
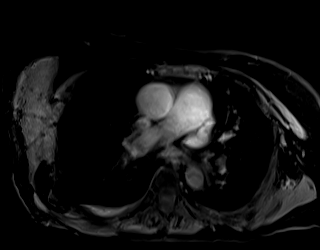

[Series 27: T1 dynamic · coronal · 3.0mm · 1.02mm/px · 2 of 72 slices shown (5 of 6)]
[im 1/72]
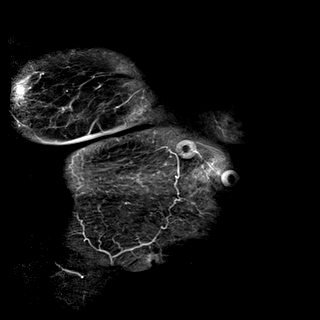
[im 72/72]
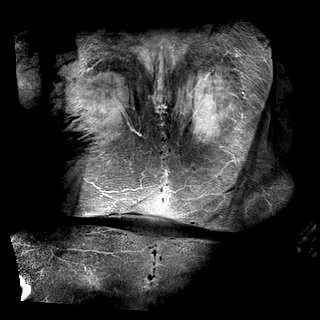

[Series 29: T1 dynamic · axial · 3.0mm · 1.02mm/px · z∈[-11,+296]mm · 3 of 104 slices shown (6 of 6)]
[im 1/104]
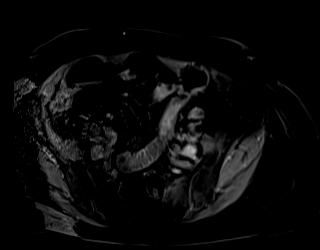
[im 52/104]
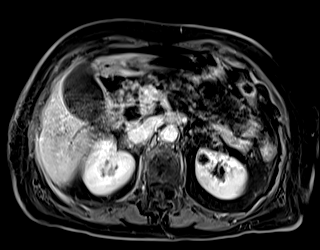
[im 104/104]
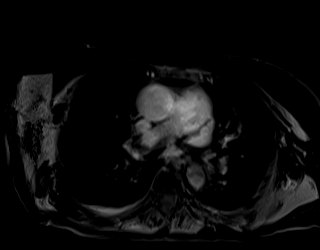

[33 of 48 positions shown; findings below may reference images not displayed]

FINDINGS: Comment: Portions of today's examination are significantly limited
by a large amount of patient respiratory motion.

Lower chest: Trace right pleural effusion lying dependently.
Cardiomegaly.

Hepatobiliary: There are 3 small hepatic lesions which are mildly T1
hypointense, mildly T2 hyperintense, demonstrate diffusion
restriction, and appear hypovascular on post gadolinium imaging,
highly concerning for metastatic lesions. These lesions measure
cm in diameter in segment 2 (axial image 15 of series [DATE] cm in
diameter in between segments 6 and 7 (axial image 15 of series 9),
and 8 mm in segment 7 (axial image 11 of series 9). MRCP images
demonstrate moderate intra and extrahepatic biliary ductal
dilatation. Common bile duct measures up to 1.6 cm distally, and
abruptly tapers immediately before the ampulla. No filling defect in
the common bile duct to suggest choledocholithiasis. Gallbladder is
moderately distended. No filling defects in the gallbladder to
suggest gallstones. No gallbladder wall thickening or
pericholecystic fluid to clearly indicate an acute cholecystitis at
this time.

Pancreas: In the right-side of the head of the pancreas extending
into the pancreaticoduodenal groove slightly cephalad from the head
of the pancreas there is a mass-like area which is T1 isointense,
predominantly T2 hypointense and demonstrates heterogeneous
enhancement (axial image 57 of series 23 and coronal image 30 of
series 27) estimated to measure approximately 2.7 x 2.5 x 3.0 cm.
This appears to exert some mass effect upon the distal common bile
duct and the main pancreatic duct adjacent to the ampulla. MRCP
images demonstrate dilatation of the main pancreatic duct which
measures up to 5 mm in diameter. Several small T1 hypointense, T2
hyperintense, nonenhancing lesions are noted in the body of the
pancreas, largest of which is located anteriorly measuring up to 9
mm (axial image 20 of series 9). No peripancreatic fluid collections
or inflammatory changes.

Spleen:  Unremarkable.

Adrenals/Urinary Tract: T1 hypointense, T2 hyperintense,
nonenhancing lesions in both kidneys are compatible with simple
cysts, largest of which is in the upper pole of the left kidney
measuring up to 1.3 cm in diameter. Urothelial thickening and
hyperenhancement in the right renal pelvis and visualized portions
of the proximal right ureter. No hydroureteronephrosis in the
visualized portions of the abdomen. Bilateral adrenal glands are
normal in appearance.

Stomach/Bowel: Visualized portions are unremarkable.

Vascular/Lymphatic: Aortic atherosclerosis. No aneurysm identified
in the visualized abdominal vasculature. No lymphadenopathy noted in
the visualized portions of the abdomen.

Other: Small amount of T2 hyperintensity inferior to the right
kidney which may reflect perinephric fluid. No significant volume of
ascites noted in the visualized portions of the peritoneal cavity.

Musculoskeletal: No aggressive appearing osseous lesions are noted
in the visualized portions of the skeleton.
IMPRESSION: 1. Mass-like area in the right lateral aspect of the head of the
pancreas extending into the pancreaticoduodenal groove and slightly
cephalad to the head of the pancreas, exerting mass effect upon the
distal common bile duct and pancreatic duct. Mild pancreatic ductal
dilatation and moderate intra and extrahepatic biliary ductal
dilatation noted at this time. Findings are concerning for probable
primary pancreatic neoplasm. Further evaluation with endoscopy
should be considered if clinically appropriate.
2. 3 suspicious hypovascular hepatic lesions highly concerning for
metastatic lesions.
3. Urothelial thickening and hyperenhancement in the right renal
pelvis and proximal right ureter. Small amount of perinephric fluid.
Correlation with urinalysis is recommended to exclude the
possibility of upper urinary tract infection.
4. Aortic atherosclerosis.
5. Trace right pleural effusion lying dependently.

## 2021-12-21 IMAGING — RF DG ERCP WO/W SPHINCTEROTOMY
1 series · 15 of 23 positions shown · non-contrast
Comparison: MRCP-06/01/2020

FLUOROSCOPY TIME:  2 minutes, 53 seconds

CLINICAL DATA: Bile duct obstruction.

EXAM:
ERCP
TECHNIQUE: Multiple spot images obtained with the fluoroscopic device and
submitted for interpretation post-procedure.

[Series 1: run · 15 of 23 slices shown]
[im 1/23]
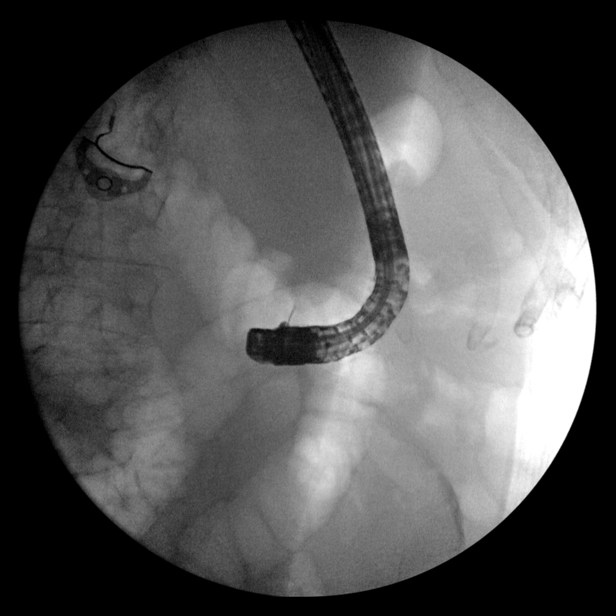
[im 3/23]
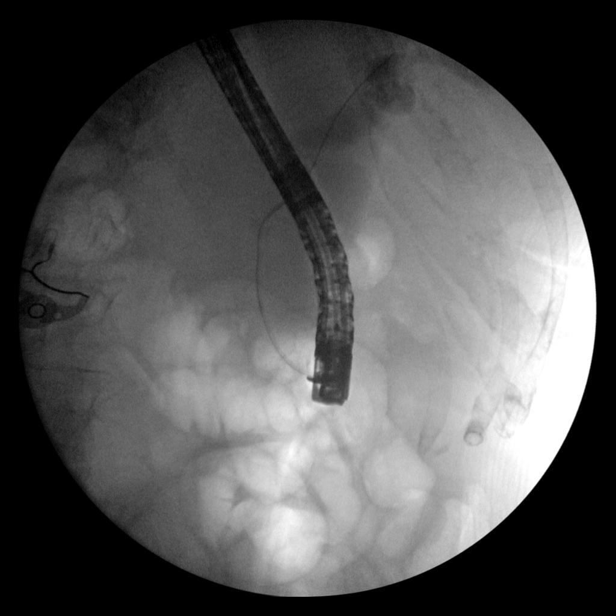
[im 4/23]
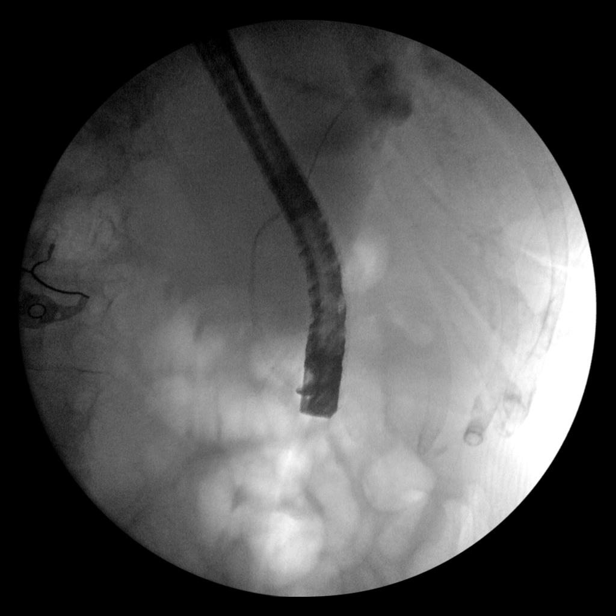
[im 6/23]
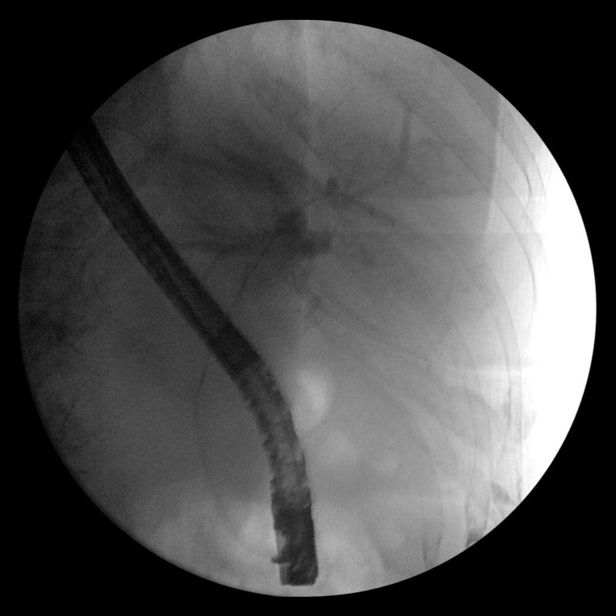
[im 7/23]
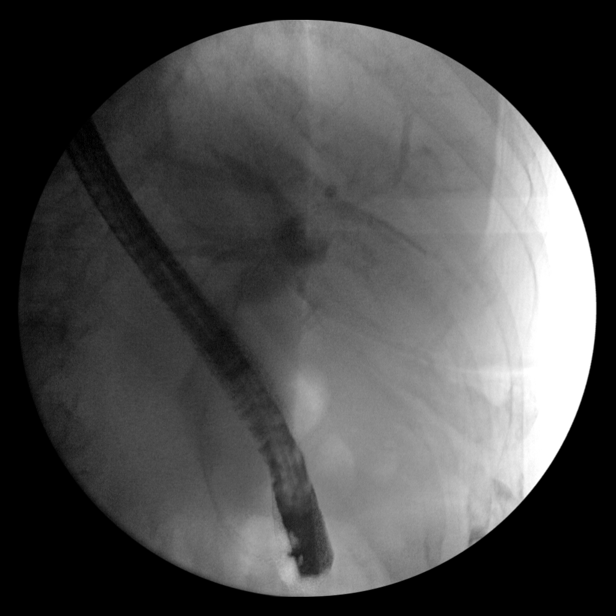
[im 9/23]
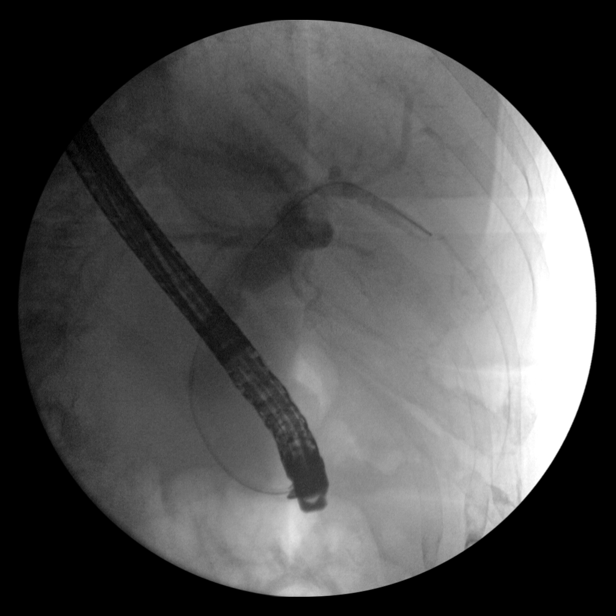
[im 10/23]
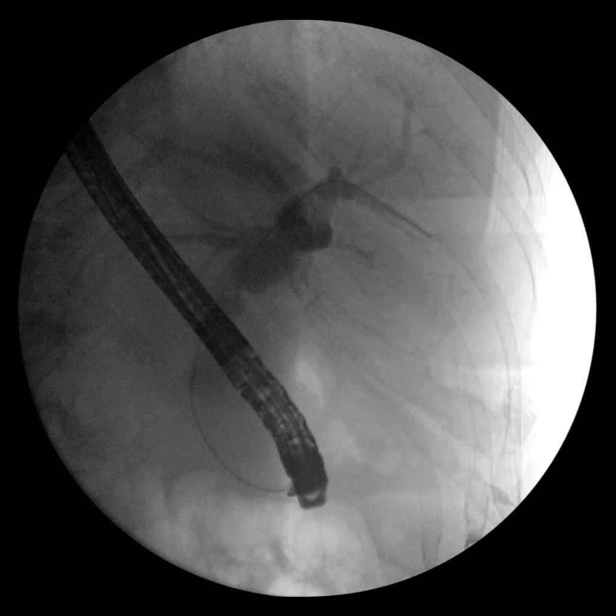
[im 12/23]
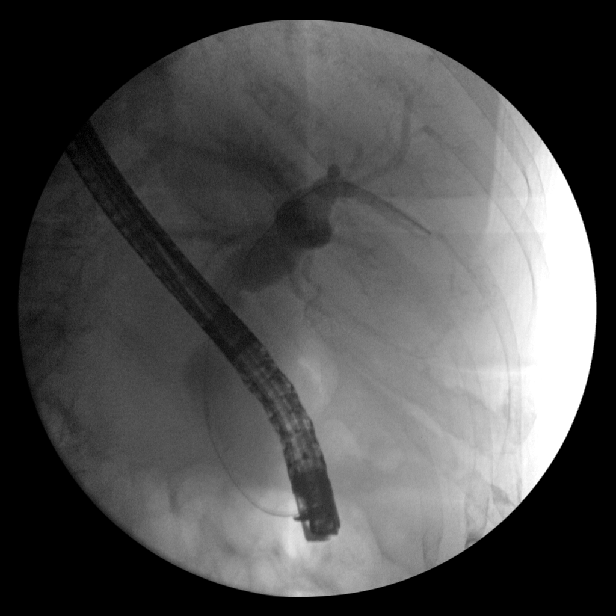
[im 14/23]
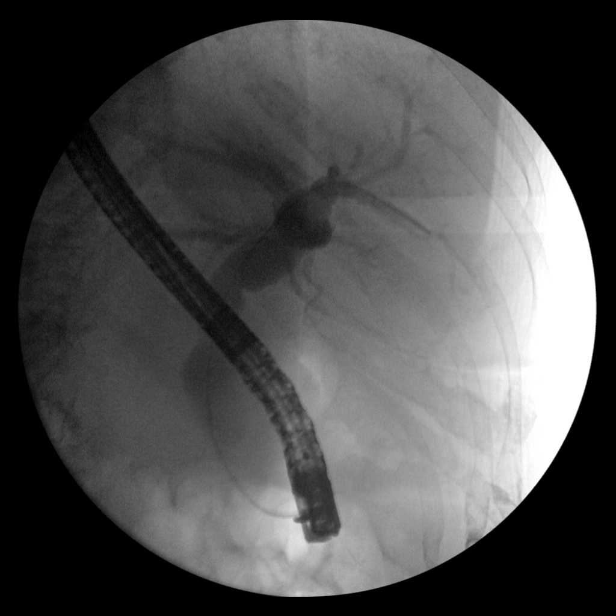
[im 15/23]
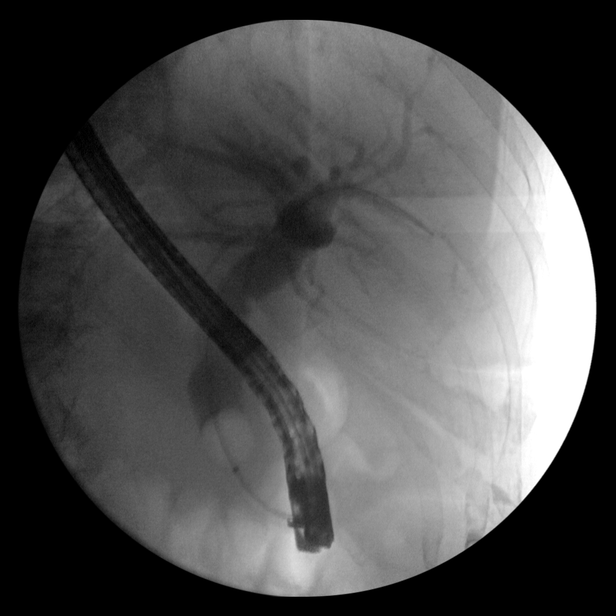
[im 17/23]
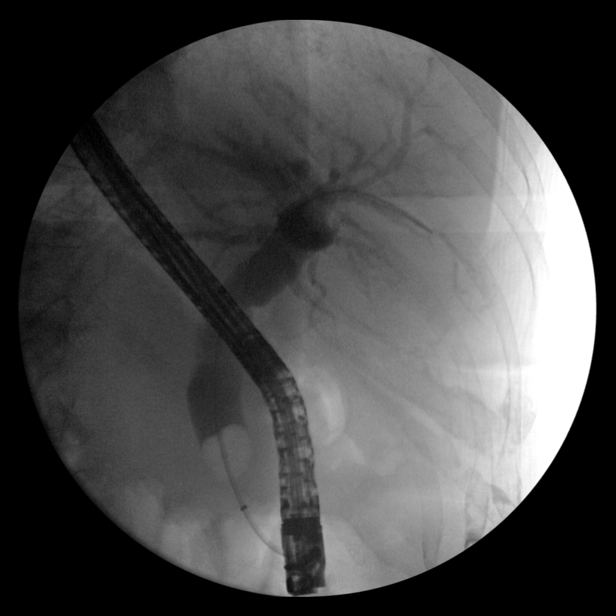
[im 18/23]
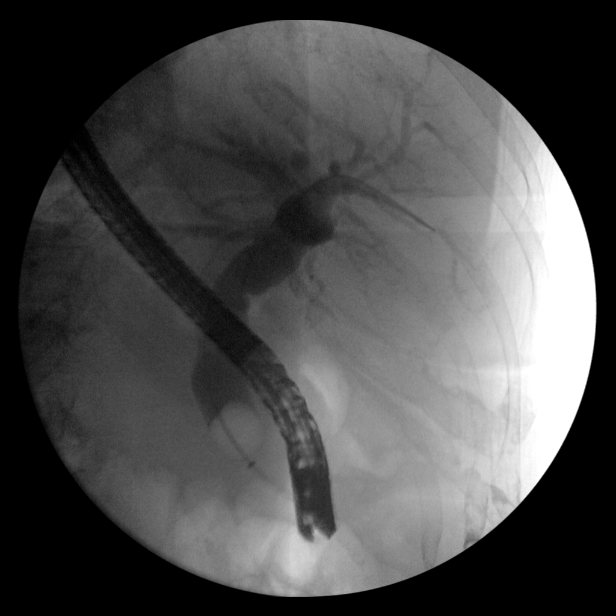
[im 20/23]
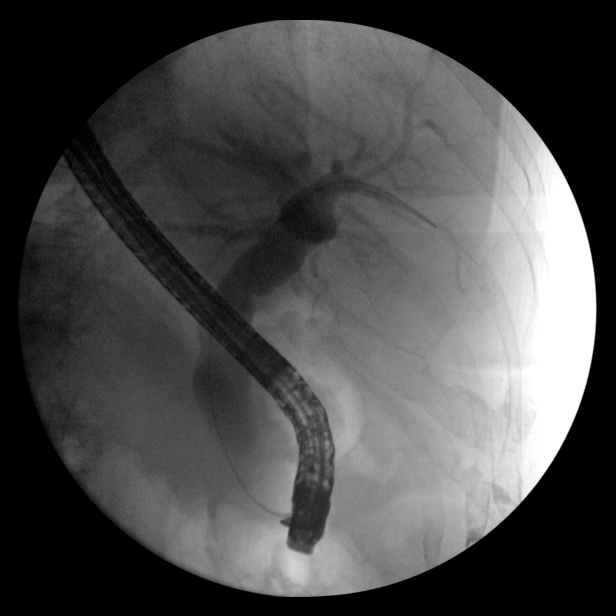
[im 21/23]
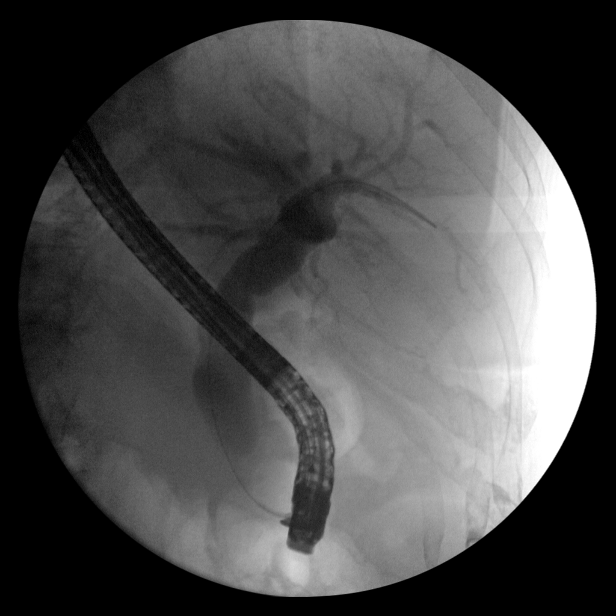
[im 23/23]
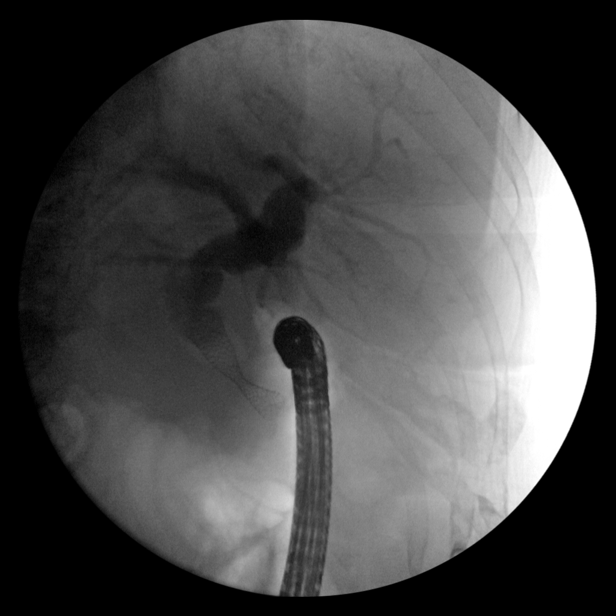

[15 of 23 positions shown; findings below may reference images not displayed]

FINDINGS: 23 spot fluoroscopic images of the right upper abdominal quadrant
during ERCP are provided for review.

Initial image demonstrates an ERCP probe overlying the right upper
abdominal quadrant.

Subsequent images demonstrate selective cannulation and
opacification of the common bile duct which appears markedly
dilated.

Subsequent images demonstrate insufflation of a balloon within the
distal aspect of the CBD with subsequent presumed biliary sweeping.

Completion image demonstrates placement of an internal stent
overlying the distal aspect of the CBD.
IMPRESSION: ERCP with biliary sweeping and stent placement as above.

These images were submitted for radiologic interpretation only.
Please see the procedural report for the amount of contrast and the
fluoroscopy time utilized.
